# Patient Record
Sex: Female | Born: 1955 | Race: Black or African American | Hispanic: No | Smoking: Former smoker
Health system: Southern US, Community
[De-identification: ages and names within clinical notes are randomized; demographics above are authoritative.]

## PROBLEM LIST (undated history)

## (undated) DIAGNOSIS — I10 Essential (primary) hypertension: Secondary | ICD-10-CM

## (undated) DIAGNOSIS — K219 Gastro-esophageal reflux disease without esophagitis: Secondary | ICD-10-CM

## (undated) DIAGNOSIS — F32A Depression, unspecified: Secondary | ICD-10-CM

## (undated) DIAGNOSIS — E785 Hyperlipidemia, unspecified: Secondary | ICD-10-CM

## (undated) DIAGNOSIS — Z9981 Dependence on supplemental oxygen: Secondary | ICD-10-CM

## (undated) DIAGNOSIS — F419 Anxiety disorder, unspecified: Secondary | ICD-10-CM

## (undated) DIAGNOSIS — J439 Emphysema, unspecified: Secondary | ICD-10-CM

## (undated) DIAGNOSIS — R002 Palpitations: Secondary | ICD-10-CM

## (undated) DIAGNOSIS — R0902 Hypoxemia: Secondary | ICD-10-CM

## (undated) DIAGNOSIS — J449 Chronic obstructive pulmonary disease, unspecified: Secondary | ICD-10-CM

## (undated) DIAGNOSIS — M1990 Unspecified osteoarthritis, unspecified site: Secondary | ICD-10-CM

## (undated) DIAGNOSIS — Z972 Presence of dental prosthetic device (complete) (partial): Secondary | ICD-10-CM

## (undated) DIAGNOSIS — T7840XA Allergy, unspecified, initial encounter: Secondary | ICD-10-CM

## (undated) DIAGNOSIS — K649 Unspecified hemorrhoids: Secondary | ICD-10-CM

## (undated) DIAGNOSIS — J302 Other seasonal allergic rhinitis: Secondary | ICD-10-CM

## (undated) DIAGNOSIS — Z860100 Personal history of colon polyps, unspecified: Secondary | ICD-10-CM

## (undated) DIAGNOSIS — F329 Major depressive disorder, single episode, unspecified: Secondary | ICD-10-CM

## (undated) DIAGNOSIS — R5383 Other fatigue: Secondary | ICD-10-CM

## (undated) DIAGNOSIS — J9610 Chronic respiratory failure, unspecified whether with hypoxia or hypercapnia: Secondary | ICD-10-CM

## (undated) DIAGNOSIS — R918 Other nonspecific abnormal finding of lung field: Secondary | ICD-10-CM

## (undated) DIAGNOSIS — Z973 Presence of spectacles and contact lenses: Secondary | ICD-10-CM

## (undated) DIAGNOSIS — D509 Iron deficiency anemia, unspecified: Secondary | ICD-10-CM

## (undated) HISTORY — DX: Essential (primary) hypertension: I10

## (undated) HISTORY — DX: Emphysema, unspecified: J43.9

## (undated) HISTORY — PX: SALPINGECTOMY: SHX328

## (undated) HISTORY — DX: Hypoxemia: R09.02

## (undated) HISTORY — DX: Palpitations: R00.2

## (undated) HISTORY — DX: Anxiety disorder, unspecified: F41.9

## (undated) HISTORY — DX: Hyperlipidemia, unspecified: E78.5

## (undated) HISTORY — PX: POLYPECTOMY: SHX149

## (undated) HISTORY — PX: OTHER SURGICAL HISTORY: SHX169

## (undated) HISTORY — DX: Other fatigue: R53.83

## (undated) HISTORY — PX: COLONOSCOPY: SHX174

## (undated) HISTORY — PX: CHOLECYSTECTOMY: SHX55

## (undated) HISTORY — DX: Unspecified osteoarthritis, unspecified site: M19.90

## (undated) HISTORY — DX: Allergy, unspecified, initial encounter: T78.40XA

## (undated) HISTORY — DX: Chronic obstructive pulmonary disease, unspecified: J44.9

---

## 1999-11-28 ENCOUNTER — Other Ambulatory Visit: Admission: RE | Admit: 1999-11-28 | Discharge: 1999-11-28 | Payer: Self-pay | Admitting: Internal Medicine

## 2005-11-02 ENCOUNTER — Ambulatory Visit: Payer: Self-pay | Admitting: Gastroenterology

## 2005-11-16 ENCOUNTER — Ambulatory Visit: Payer: Self-pay | Admitting: Gastroenterology

## 2005-11-16 ENCOUNTER — Encounter (INDEPENDENT_AMBULATORY_CARE_PROVIDER_SITE_OTHER): Payer: Self-pay | Admitting: Specialist

## 2005-12-07 ENCOUNTER — Encounter: Admission: RE | Admit: 2005-12-07 | Discharge: 2005-12-07 | Payer: Self-pay | Admitting: Gastroenterology

## 2006-01-05 ENCOUNTER — Ambulatory Visit (HOSPITAL_COMMUNITY): Admission: RE | Admit: 2006-01-05 | Discharge: 2006-01-05 | Payer: Self-pay | Admitting: Gastroenterology

## 2006-01-05 ENCOUNTER — Encounter: Admission: RE | Admit: 2006-01-05 | Discharge: 2006-01-05 | Payer: Self-pay | Admitting: Gastroenterology

## 2008-04-07 ENCOUNTER — Emergency Department (HOSPITAL_COMMUNITY): Admission: EM | Admit: 2008-04-07 | Discharge: 2008-04-07 | Payer: Self-pay | Admitting: Emergency Medicine

## 2008-10-02 ENCOUNTER — Encounter (INDEPENDENT_AMBULATORY_CARE_PROVIDER_SITE_OTHER): Payer: Self-pay | Admitting: *Deleted

## 2008-11-12 ENCOUNTER — Encounter: Payer: Self-pay | Admitting: *Deleted

## 2010-09-12 ENCOUNTER — Other Ambulatory Visit (HOSPITAL_BASED_OUTPATIENT_CLINIC_OR_DEPARTMENT_OTHER): Payer: Self-pay | Admitting: Internal Medicine

## 2010-09-12 DIAGNOSIS — M549 Dorsalgia, unspecified: Secondary | ICD-10-CM

## 2010-09-15 ENCOUNTER — Ambulatory Visit
Admission: RE | Admit: 2010-09-15 | Discharge: 2010-09-15 | Disposition: A | Discharge status: Home / Self Care | Payer: BC Managed Care – PPO | Source: Ambulatory Visit | Attending: Internal Medicine | Admitting: Internal Medicine

## 2010-09-15 DIAGNOSIS — M549 Dorsalgia, unspecified: Secondary | ICD-10-CM

## 2010-10-01 ENCOUNTER — Other Ambulatory Visit: Payer: Self-pay | Admitting: Neurological Surgery

## 2010-10-01 DIAGNOSIS — M25552 Pain in left hip: Secondary | ICD-10-CM

## 2010-10-02 ENCOUNTER — Ambulatory Visit
Admission: RE | Admit: 2010-10-02 | Discharge: 2010-10-02 | Disposition: A | Discharge status: Home / Self Care | Payer: BC Managed Care – PPO | Source: Ambulatory Visit | Attending: Neurological Surgery | Admitting: Neurological Surgery

## 2010-10-02 DIAGNOSIS — M25552 Pain in left hip: Secondary | ICD-10-CM

## 2010-12-01 ENCOUNTER — Ambulatory Visit
Admission: RE | Admit: 2010-12-01 | Discharge: 2010-12-01 | Disposition: A | Discharge status: Home / Self Care | Payer: BC Managed Care – PPO | Source: Ambulatory Visit | Attending: Neurological Surgery | Admitting: Neurological Surgery

## 2010-12-01 ENCOUNTER — Other Ambulatory Visit: Payer: Self-pay | Admitting: Neurological Surgery

## 2010-12-01 DIAGNOSIS — M545 Low back pain, unspecified: Secondary | ICD-10-CM

## 2010-12-02 ENCOUNTER — Other Ambulatory Visit: Payer: Self-pay | Admitting: Neurosurgery

## 2010-12-02 ENCOUNTER — Other Ambulatory Visit: Payer: Self-pay | Admitting: Neurological Surgery

## 2010-12-02 DIAGNOSIS — M545 Low back pain: Secondary | ICD-10-CM

## 2010-12-03 ENCOUNTER — Ambulatory Visit
Admission: RE | Admit: 2010-12-03 | Discharge: 2010-12-03 | Disposition: A | Discharge status: Home / Self Care | Payer: BC Managed Care – PPO | Source: Ambulatory Visit | Attending: Neurological Surgery | Admitting: Neurological Surgery

## 2010-12-03 DIAGNOSIS — M545 Low back pain: Secondary | ICD-10-CM

## 2010-12-25 ENCOUNTER — Encounter: Payer: Self-pay | Admitting: Gastroenterology

## 2011-01-16 ENCOUNTER — Ambulatory Visit (AMBULATORY_SURGERY_CENTER): Payer: BC Managed Care – PPO | Admitting: *Deleted

## 2011-01-16 VITALS — Ht 67.0 in | Wt 155.6 lb

## 2011-01-16 DIAGNOSIS — Z860100 Personal history of colon polyps, unspecified: Secondary | ICD-10-CM

## 2011-01-16 MED ORDER — PEG-KCL-NACL-NASULF-NA ASC-C 100 G PO SOLR: 1.0000 | Freq: Once | ORAL | Status: DC

## 2011-01-16 NOTE — Patient Instructions (Signed)
Please pick up prep at pharmacy Please read over instructions

## 2011-01-19 ENCOUNTER — Encounter: Payer: Self-pay | Admitting: Gastroenterology

## 2011-01-26 ENCOUNTER — Ambulatory Visit (AMBULATORY_SURGERY_CENTER): Payer: BC Managed Care – PPO | Admitting: Gastroenterology

## 2011-01-26 ENCOUNTER — Encounter: Payer: Self-pay | Admitting: Gastroenterology

## 2011-01-26 VITALS — BP 137/80 | HR 98 | Temp 98.5°F | Resp 15 | Ht 67.0 in | Wt 155.0 lb

## 2011-01-26 DIAGNOSIS — K648 Other hemorrhoids: Secondary | ICD-10-CM

## 2011-01-26 DIAGNOSIS — Z1211 Encounter for screening for malignant neoplasm of colon: Secondary | ICD-10-CM

## 2011-01-26 DIAGNOSIS — Z86010 Personal history of colonic polyps: Secondary | ICD-10-CM

## 2011-01-26 MED ORDER — SODIUM CHLORIDE 0.9 % IV SOLN: 500.0000 mL | INTRAVENOUS | Status: DC

## 2011-01-26 NOTE — Patient Instructions (Signed)
Discharge instructions given with verbal understanding. Handout on hemorrhoids and a high fiber diet given. Resume previous medications.

## 2011-01-27 ENCOUNTER — Telehealth: Payer: Self-pay | Admitting: *Deleted

## 2011-01-27 NOTE — Telephone Encounter (Signed)

## 2011-01-28 ENCOUNTER — Other Ambulatory Visit: Payer: BC Managed Care – PPO | Admitting: Gastroenterology

## 2011-05-28 DIAGNOSIS — Z0279 Encounter for issue of other medical certificate: Secondary | ICD-10-CM

## 2012-03-08 ENCOUNTER — Other Ambulatory Visit (HOSPITAL_COMMUNITY): Payer: Self-pay | Admitting: Internal Medicine

## 2012-03-08 DIAGNOSIS — I313 Pericardial effusion (noninflammatory): Secondary | ICD-10-CM

## 2012-03-09 ENCOUNTER — Ambulatory Visit (HOSPITAL_COMMUNITY): Payer: BC Managed Care – PPO | Attending: Cardiovascular Disease | Admitting: Radiology

## 2012-03-09 DIAGNOSIS — J449 Chronic obstructive pulmonary disease, unspecified: Secondary | ICD-10-CM | POA: Insufficient documentation

## 2012-03-09 DIAGNOSIS — R0609 Other forms of dyspnea: Secondary | ICD-10-CM | POA: Insufficient documentation

## 2012-03-09 DIAGNOSIS — I313 Pericardial effusion (noninflammatory): Secondary | ICD-10-CM

## 2012-03-09 DIAGNOSIS — J4489 Other specified chronic obstructive pulmonary disease: Secondary | ICD-10-CM | POA: Insufficient documentation

## 2012-03-09 DIAGNOSIS — R0989 Other specified symptoms and signs involving the circulatory and respiratory systems: Secondary | ICD-10-CM | POA: Insufficient documentation

## 2012-03-09 DIAGNOSIS — I10 Essential (primary) hypertension: Secondary | ICD-10-CM | POA: Insufficient documentation

## 2012-03-09 DIAGNOSIS — Z87891 Personal history of nicotine dependence: Secondary | ICD-10-CM | POA: Insufficient documentation

## 2012-03-09 NOTE — Progress Notes (Signed)
Echocardiogram performed.  

## 2012-03-10 ENCOUNTER — Encounter (HOSPITAL_COMMUNITY): Payer: Self-pay | Admitting: Internal Medicine

## 2012-03-31 ENCOUNTER — Ambulatory Visit (HOSPITAL_COMMUNITY)
Admission: RE | Admit: 2012-03-31 | Discharge: 2012-03-31 | Disposition: A | Discharge status: Home / Self Care | Payer: BC Managed Care – PPO | Source: Ambulatory Visit | Attending: Internal Medicine | Admitting: Internal Medicine

## 2012-03-31 DIAGNOSIS — J4489 Other specified chronic obstructive pulmonary disease: Secondary | ICD-10-CM | POA: Insufficient documentation

## 2012-03-31 DIAGNOSIS — J449 Chronic obstructive pulmonary disease, unspecified: Secondary | ICD-10-CM | POA: Insufficient documentation

## 2012-03-31 LAB — PULMONARY FUNCTION TEST

## 2012-03-31 MED ORDER — ALBUTEROL SULFATE (5 MG/ML) 0.5% IN NEBU
2.5000 mg | INHALATION_SOLUTION | Freq: Once | RESPIRATORY_TRACT | Status: AC
  Administered 2012-03-31: 2.5 mg via RESPIRATORY_TRACT

## 2012-06-10 ENCOUNTER — Other Ambulatory Visit: Payer: BC Managed Care – PPO

## 2012-06-10 ENCOUNTER — Encounter: Payer: Self-pay | Admitting: Internal Medicine

## 2012-06-10 ENCOUNTER — Telehealth: Payer: Self-pay | Admitting: Internal Medicine

## 2012-06-10 ENCOUNTER — Ambulatory Visit (INDEPENDENT_AMBULATORY_CARE_PROVIDER_SITE_OTHER): Payer: BC Managed Care – PPO | Admitting: Internal Medicine

## 2012-06-10 VITALS — BP 142/92 | HR 92 | Temp 98.0°F | Ht 66.0 in | Wt 176.8 lb

## 2012-06-10 DIAGNOSIS — J449 Chronic obstructive pulmonary disease, unspecified: Secondary | ICD-10-CM

## 2012-06-10 DIAGNOSIS — Z23 Encounter for immunization: Secondary | ICD-10-CM

## 2012-06-10 MED ORDER — PREDNISONE 10 MG PO TABS: ORAL_TABLET | ORAL | Status: DC

## 2012-06-10 MED ORDER — BUDESONIDE-FORMOTEROL FUMARATE 80-4.5 MCG/ACT IN AERO: 2.0000 | INHALATION_SPRAY | Freq: Two times a day (BID) | RESPIRATORY_TRACT | Status: DC

## 2012-06-10 MED ORDER — TIOTROPIUM BROMIDE MONOHYDRATE 18 MCG IN CAPS: 18.0000 ug | ORAL_CAPSULE | Freq: Every day | RESPIRATORY_TRACT | Status: DC

## 2012-06-10 NOTE — Progress Notes (Signed)
Subjective:    Patient ID: Katie Greene, female    DOB: 1956-01-16, 56 y.o.   MRN: [id number]  HPI  PCP is Garlan Fillers, MD  Body mass index is 28.54 kg/(m^2).  reports that she quit smoking about 3 months ago. Her smoking use included Cigarettes. She has a 12.5 pack-year smoking history. She has never used smokeless tobacco.   IOV 06/10/2012 Referred for copd. She quit smoking July 2013.   - She is an AA female  On nocturnal o2 x  Years however but was told it was due to "going into early copd". Then in July 2013: had acute bronchitis illness. Aparently was told at that time in [facility] urgent care she had copd. Firs time she knew of copd was in [place] 2013. Placed on 24h oxyge in July 2012.  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 Rx tudorza, and qvar  - Currently high symptom burden with CAT score 28 (see below) and therefore referred here. 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 She prefers to work but having difficulty with dyspnea. So she tried to but could not. Now accepting of filing for disability and is inclined to do that. Also, facing high cost of medication . Never been to pulmonary rehab. Not tested for alpha 1. Has not had flu shot 2013-2014 season. Not had pneumovax  - She is also worried she might have lung cancer; several family members have had various cancers   CAT COPD Symptom & Quality of Life Score (GSK trademark) 0 is no burden. 5 is highest burden 06/10/2012   Never Cough -> Cough all the time 3  No phlegm in chest -> Chest is full of phlegm 2  No chest tightness -> Chest feels very tight 2  No dyspnea for 1 flight stairs/hill -> Very dyspneic for 1 flight of stairs 5  No limitations for ADL at home -> Very limited with ADL at home 5  Confident leaving home -> Not at all confident leaving home 3  Sleep soundly -> Do not sleep soundly because of lung condition 4  Lots of  Energy -> No energy at all 4  TOTAL Score (max 40)  28        PFT FVC fev1 ratio BD fev1 TLC DLCO comment  03/31/12 1.65L/55% 0.78L/32% 47/58% 11% 4.8L/88% 6.5/23%% Gold stage 3 copd with post bd fev1 0.86L/36%     Past Medical History  Diagnosis Date  . Anxiety   . Arthritis   . Hyperlipidemia   . Hypertension   . Palpitations   . Fatigue   . Chest pain     Improved     Family History  Problem Relation Age of Onset  . Colon cancer Neg Hx   . Heart attack Mother   . Cancer Father   . Hypertension Father   . Cancer Brother     Throat Cancer     History   Social History  . Marital Status: Married    Spouse Name: N/A    Number of Children: N/A  . Years of Education: N/A   Occupational History  . Not on file.   Social History Main Topics  . Smoking status: Former Smoker -- 0.5 packs/day for 25 years    Types: Cigarettes    Quit date: 03/02/2012  . Smokeless tobacco: Never Used     Comment: pt states she smoked some  and stopped again 8-13.  Marland Kitchen Alcohol Use: No  . Drug Use: No  . Sexually Active: Not on file   Other Topics Concern  . Not on file   Social History Narrative  . No narrative on file     No Known Allergies   Outpatient Prescriptions Prior to Visit  Medication Sig Dispense Refill  . amLODipine (NORVASC) 5 MG tablet Take 5 mg by mouth daily.        Marland Kitchen aspirin 81 MG tablet Take 81 mg by mouth daily.        . meloxicam (MOBIC) 7.5 MG tablet 1 tablet Daily.      . pravastatin (PRAVACHOL) 20 MG tablet Take 40 mg by mouth daily.       . temazepam (RESTORIL) 15 MG capsule 1 tablet Daily.      . TOPROL XL 50 MG 24 hr tablet 1 tablet Daily.      . [DISCONTINUED] hydrocodone-acetaminophen (LORCET-HD) 5-500 MG per capsule Take 1 capsule by mouth every 6 (six) hours as needed.        . [DISCONTINUED] quinapril-hydrochlorothiazide (ACCURETIC) 20-25 MG per tablet Take 1 tablet by mouth daily.         Last reviewed on 06/10/2012 11:21 AM by Darrell Jewel, CMA      Review of Systems  Constitutional: Negative for fever and unexpected weight change.  HENT: Positive for sneezing. Negative for ear pain, nosebleeds, congestion, sore throat, rhinorrhea, trouble swallowing, dental problem, postnasal drip and sinus pressure.   Eyes: Negative for redness and itching.  Respiratory: Positive for cough and shortness of breath. Negative for chest tightness and wheezing.   Cardiovascular: Positive for palpitations. Negative for leg swelling.  Gastrointestinal: Negative for nausea and vomiting.  Genitourinary: Negative for dysuria.  Musculoskeletal: Negative for joint swelling.  Skin: Negative for rash.  Neurological: Negative for headaches.  Hematological: Does not bruise/bleed easily.  Psychiatric/Behavioral: Negative for dysphoric mood. The patient is not nervous/anxious.        Objective:   Physical Exam  Vitals reviewed. Constitutional: She is oriented to person, place, and time. She appears well-developed and well-nourished. No distress.       Body mass index is 28.54 kg/(m^2).   HENT:  Head: Normocephalic and atraumatic.  Right Ear: External ear normal.  Left Ear: External ear normal.  Mouth/Throat: Oropharynx is clear and moist. No oropharyngeal exudate.  Eyes: Conjunctivae normal and EOM are normal. Pupils are equal, round, and reactive to light. Right eye exhibits no discharge. Left eye exhibits no discharge. No scleral icterus.  Neck: Normal range of motion. Neck supple. No JVD present. No tracheal deviation present. No thyromegaly present.  Cardiovascular: Normal rate, regular rhythm, normal heart sounds and intact distal pulses.  Exam reveals no gallop and no friction rub.   No murmur heard. Pulmonary/Chest: Effort normal. No respiratory distress. She has no wheezes. She has no rales. She exhibits no tenderness.       Mild purse lip breathing when emotiona Expiraton prolonged  Abdominal: Soft. Bowel sounds are normal.  She exhibits no distension and no mass. There is no tenderness. There is no rebound and no guarding.  Musculoskeletal: Normal range of motion. She exhibits no edema and no tenderness.  Lymphadenopathy:    She has no cervical adenopathy.  Neurological: She is alert and oriented to person, place, and time. She has normal reflexes. No cranial nerve deficit. She exhibits normal muscle tone. Coordination normal.  Skin: Skin is warm and  dry. No rash noted. She is not diaphoretic. No erythema. No pallor.  Psychiatric: She has a normal mood and affect. Her behavior is normal. Judgment and thought content normal.       Flat affect Periodic reactive grief reaction during hx          Assessment & Plan:

## 2012-06-10 NOTE — Patient Instructions (Addendum)
#  COPD  - glad you quit smoking  - continne oxygen  - have flu shot and pneumovax today 06/10/2012 - have blood work for genetic cause of copd - stop tudorza and qvar - Take prednisone 40 mg daily x 2 days, then 20mg  daily x 2 days, then 10mg  daily x 2 days, then 5mg  daily x 2 days and stop - Please start spiriva 1 puff daily - take sample, script and show technique - Please start symbicort 80/4.5 2 puff twice daily - take sample, script and show technique - referring you to pulmonary rehab at [place]   - give health port short term disability paper work  #Lung Cancer screening  - will discuss at next visit  #Followup  - 3 weeks to report progress and discuss cancer screening

## 2012-06-10 NOTE — Telephone Encounter (Signed)
NOTE: FAX THIS TO [facility] ; ATTN: CENTRALIZED LEAVE DEPT  RE: CASE # [id number]. FAX # [phone].Hazel Sams

## 2012-06-10 NOTE — Telephone Encounter (Signed)
[email or web address]  

## 2012-06-10 NOTE — Telephone Encounter (Signed)
Spoke with pt.  States she needs a letter along with information from today's visit sent to below.  Letter needs to include a "tenative" return to work date.  MR, pls advise.  Thank you.

## 2012-06-11 NOTE — Telephone Encounter (Signed)
she had tried to go back to work and was very difficult due to her copd. Based on this, early part of interview she said she hoped to get better and then go back to work. Later, she said her pcp Garlan Fillers, MD had recommended disability and she wished to pursue that (short term). So I think Victorino Dike told her to go to health port with disability paper work.  So, I am confused now .  Does she want simply a letter with return date to work ? IF so, can it be several months from now. OR she still wants to go ahed with short term disability paper work?  Thanks MR

## 2012-06-12 ENCOUNTER — Encounter: Payer: Self-pay | Admitting: Internal Medicine

## 2012-06-12 DIAGNOSIS — J449 Chronic obstructive pulmonary disease, unspecified: Secondary | ICD-10-CM | POA: Insufficient documentation

## 2012-06-12 NOTE — Assessment & Plan Note (Signed)
#  COPD  - glad you quit smoking  - continne oxygen  - have flu shot and pneumovax today 06/10/2012 - have blood work for genetic cause of copd - stop tudorza and qvar - Take prednisone 40 mg daily x 2 days, then 20mg daily x 2 days, then 10mg daily x 2 days, then 5mg daily x 2 days and stop - Please start spiriva 1 puff daily - take sample, script and show technique - Please start symbicort 80/4.5 2 puff twice daily - take sample, script and show technique - referring you to pulmonary rehab at [place]   - give health port short term disability paper work  #Lung Cancer screening  - will discuss at next visit  #Followup  - 3 weeks to report progress and discuss cancer screening  

## 2012-06-13 ENCOUNTER — Encounter: Payer: Self-pay | Admitting: Internal Medicine

## 2012-06-13 NOTE — Telephone Encounter (Signed)
Pt states she needs a letter from MR extending her LOA date from work past today's date, 06/13/12. The letter then needs to be faxed to:  1) [name]. Attn Katina Degree, fax # [phone]  2) Heide Scales. Fax # [phone]

## 2012-06-14 ENCOUNTER — Encounter: Payer: Self-pay | Admitting: Internal Medicine

## 2012-06-14 NOTE — Telephone Encounter (Signed)
Done and sent to triage

## 2012-06-14 NOTE — Telephone Encounter (Signed)
Need to know if note was sent to to centralized leave department extending continuously leave which would end today(06/13/12).Case# [id number] fax# [phone].This information will also need to be sent to [employer] short term disability office([name]-fax# [phone] incident#[id number])and [name] hr department([name] fax# [phone]).This information is needed asap so that i can continue to get short term/long term disablity when short term has ended.Thanks for all your help. Katie Greene   This was forwarded from an email we received from the pt yesterday. We sent an email back to the pt to let her know MR aware of what she is needing.

## 2012-06-15 NOTE — Telephone Encounter (Signed)
Spoke with pt and informed that letter was completed and faxed to all three numbers that pt requested.  (Letter was saved in triage in case something didn't go through.)

## 2012-06-22 NOTE — Progress Notes (Signed)
Quick Note:  Spoke with patient, made her aware of results as listed below by MR. Verbalized understanding of this and nothing further needed at this time. ______

## 2012-06-29 ENCOUNTER — Ambulatory Visit (INDEPENDENT_AMBULATORY_CARE_PROVIDER_SITE_OTHER): Payer: BC Managed Care – PPO | Admitting: Internal Medicine

## 2012-06-29 ENCOUNTER — Encounter: Payer: Self-pay | Admitting: Internal Medicine

## 2012-06-29 VITALS — BP 150/96 | HR 121 | Temp 97.7°F | Ht 64.0 in | Wt 176.0 lb

## 2012-06-29 DIAGNOSIS — J449 Chronic obstructive pulmonary disease, unspecified: Secondary | ICD-10-CM

## 2012-06-29 DIAGNOSIS — Z129 Encounter for screening for malignant neoplasm, site unspecified: Secondary | ICD-10-CM

## 2012-06-29 DIAGNOSIS — Z87891 Personal history of nicotine dependence: Secondary | ICD-10-CM

## 2012-06-29 NOTE — Progress Notes (Signed)
Subjective:    Patient ID: Katie Greene, female    DOB: [birth year], 56 y.o.   MRN: [id number]  HPI PCP is Garlan Fillers, MD  #COPD - Gold stage 3. On home o2 since July 2012.  PFT FVC fev1 ratio BD fev1 TLC DLCO comment  03/31/12 1.65L/55% 0.78L/32% 47/58% 11% 4.8L/88% 6.5/23%% Gold stage 3 copd with post bd fev1 0.86L/36%  - RX spiriva/symbicort - Applied disability Nov 2013  #SMoking  - quit 2013  #Overweight  - Body mass index is 28.54 kg/(m^2).   #Lung cancer rrisk  - age, smoking, strong family hx of several cancers     OV 06/29/2012 Fu COPD and lung cancer screen discussion  - At last visit earlier this month started on spiriva, symbicort and given pred burst. Asked to continue o2. With these she is "some" improved only. CAT score is 27 (prior was 28). No new issues. No chest pain. She feels stable. REhab is yet to call her  - Lung cancer screen: she is inerested in this based on data. She is not sure she can pay $300 out of pocket. Wants me to try through insurance first    CAT COPD Symptom & Quality of Life Score (GSK trademark) 0 is no burden. 5 is highest burden 06/10/2012  06/29/2012   Never Cough -> Cough all the time 3 2  No phlegm in chest -> Chest is full of phlegm 2 3  No chest tightness -> Chest feels very tight 2 3  No dyspnea for 1 flight stairs/hill -> Very dyspneic for 1 flight of stairs 5 5  No limitations for ADL at home -> Very limited with ADL at home 5 3  Confident leaving home -> Not at all confident leaving home 3 4  Sleep soundly -> Do not sleep soundly because of lung condition 4 4  Lots of Energy -> No energy at all 4 3  TOTAL Score (max 40)  28 27         Review of Systems  Constitutional: Negative for fever and unexpected weight change.  HENT: Negative for ear pain, nosebleeds, congestion, sore throat, rhinorrhea, sneezing, trouble swallowing, dental problem, postnasal drip and sinus pressure.   Eyes: Negative for redness  and itching.  Respiratory: Positive for shortness of breath. Negative for cough, chest tightness and wheezing.   Cardiovascular: Negative for palpitations and leg swelling.  Gastrointestinal: Negative for nausea and vomiting.  Genitourinary: Negative for dysuria.  Musculoskeletal: Negative for joint swelling.  Skin: Negative for rash.  Neurological: Positive for headaches.  Hematological: Does not bruise/bleed easily.  Psychiatric/Behavioral: Negative for dysphoric mood. The patient is not nervous/anxious.        Objective:   Physical Exam Vitals reviewed. Filed Vitals:   06/29/12 1528  BP: 150/96  Pulse: 121  Temp: 97.7 F (36.5 C)  TempSrc: Oral  Height: 5\' 4"  (1.626 m)  Weight: 176 lb (79.833 kg)  SpO2: 93%    Constitutional: She is oriented to person, place, and time. She appears well-developed and well-nourished. No distress.       Body mass index is 28.54 kg/(m^2).   HENT:  Head: Normocephalic and atraumatic.  Right Ear: External ear normal.  Left Ear: External ear normal.  Mouth/Throat: Oropharynx is clear and moist. No oropharyngeal exudate.  Eyes: Conjunctivae normal and EOM are normal. Pupils are equal, round, and reactive to light. Right eye exhibits no discharge. Left eye exhibits no discharge. No scleral icterus.  Neck: Normal range of  motion. Neck supple. No JVD present. No tracheal deviation present. No thyromegaly present.  Cardiovascular: Normal rate, regular rhythm, normal heart sounds and intact distal pulses.  Exam reveals no gallop and no friction rub.   No murmur heard. Pulmonary/Chest: Effort normal. No respiratory distress. She has no wheezes. She has no rales. She exhibits no tenderness.       Mild purse lip breathing when emotiona Expiraton prolonged  Abdominal: Soft. Bowel sounds are normal. She exhibits no distension and no mass. There is no tenderness. There is no rebound and no guarding.  Musculoskeletal: Normal range of motion. She exhibits  no edema and no tenderness.  Lymphadenopathy:    She has no cervical adenopathy.  Neurological: She is alert and oriented to person, place, and time. She has normal reflexes. No cranial nerve deficit. She exhibits normal muscle tone. Coordination normal.  Skin: Skin is warm and dry. No rash noted. She is not diaphoretic. No erythema. No pallor.  Psychiatric: She has a normal mood and affect. Her behavior is normal. Judgment and thought content normal.       Flat affect Periodic reactive grief reaction during hx           Assessment & Plan:

## 2012-06-29 NOTE — Patient Instructions (Addendum)
#  COPD -  glad you some better  - continne oxygen  -continue  spiriva 1 puff daily - take samples - continue symbicort 80/4.5 2 puff twice daily - take samples  - I have emailed rehab people, you should  Hear from them < 1 month; rehab is most important part of treatment   -at some point in future we can discuss lung transplant  #Lung Cancer screening  - discussed this we will see if insurance will pay for it. If not will hold off due to $300 cost  #Followup  - 3 months to report progress with CAT score - call any time there is a problem [phone] (esp if there is a copd flare

## 2012-06-29 NOTE — Assessment & Plan Note (Signed)
Discussed screening Ct chest for early detection of lung cancer Explained that in age 58-75 and smoking history, annual low dose CT chest can pick up lung cancer early and has potential to save lives and cure lung cancer This is similar to screening mammogram, colonoscopies and pap smears Explained Ct scan is low dose radiation Explained early lung cancer is curable but asymptomatic and best way to detect is CT scan Explained CT superior to CXR Explained that false positives are present and can incur cost and workup like biopsies, additional scan but benefit outweighs risk Explained currently out of pocket $300   Baesd on this she wants me to try to get it through insurance

## 2012-06-29 NOTE — Assessment & Plan Note (Signed)
#  COPD -  glad you some better  - continne oxygen  -continue  spiriva 1 puff daily - take samples - continue symbicort 80/4.5 2 puff twice daily - take samples  - I have emailed rehab people, you should  Hear from them < 1 month; rehab is most important part of treatment   -at some point in future we can discuss lung transplant

## 2012-07-05 ENCOUNTER — Encounter: Payer: Self-pay | Admitting: Internal Medicine

## 2012-07-06 ENCOUNTER — Other Ambulatory Visit: Payer: Self-pay | Admitting: Internal Medicine

## 2012-07-06 DIAGNOSIS — J449 Chronic obstructive pulmonary disease, unspecified: Secondary | ICD-10-CM

## 2012-07-07 ENCOUNTER — Other Ambulatory Visit: Payer: BC Managed Care – PPO

## 2012-07-07 NOTE — Telephone Encounter (Signed)
Dr. Marchelle Gearing,  The pt is requesting a letter extending her leave from work through her appt with you in Feb. Please advise if ok to give extension letter. Thanks. Carron Curie, CMA

## 2012-07-12 ENCOUNTER — Encounter: Payer: Self-pay | Admitting: Internal Medicine

## 2012-07-15 ENCOUNTER — Telehealth: Payer: Self-pay | Admitting: Internal Medicine

## 2012-07-15 NOTE — Telephone Encounter (Signed)
Returning a call to the nurse about her ct scan she spoke to the ins ct is covered up to 90% and spoke to them about rehab [phone]

## 2012-07-15 NOTE — Telephone Encounter (Signed)
I spoke with pt and she stated she spoke with her insurance and she stated CT scan will cover 90% and her pulm rehab will need prior authorization. Please advise PCC;s

## 2012-07-18 ENCOUNTER — Encounter: Payer: Self-pay | Admitting: Internal Medicine

## 2012-07-18 NOTE — Telephone Encounter (Signed)
Pt wants to extend her leave up to 6 months since she has been scheduled for rehab from 08-09-12 to 12-06-12. Please advise if ok to write the extension. Please send response to [facility]. Thanks.Carron Curie, CMA

## 2012-07-18 NOTE — Telephone Encounter (Signed)
Please include the following procedure codes when requesting pulmonary rehab: G0237, G0238, G0239 and [id number]. Katie Greene

## 2012-07-18 NOTE — Telephone Encounter (Signed)
Called and spoke with Katie Greene at [facility] [place] and she stated that pre-determination is required for pulmonary rehab. I can fax notes, but a letter of medial necessity would have to come from the physician.  Once I have the letter, I can fax clinical's with the letter to the below fax number. Rhonda J Cobb

## 2012-07-18 NOTE — Telephone Encounter (Signed)
Will forward to MR to see what he wants put in the letter Please advise, thanks!

## 2012-07-25 NOTE — Telephone Encounter (Signed)
Dr. Marchelle Gearing please advise on letter. There is also a patient email in your box on this patient as well. Please advise. Carron Curie, CMA

## 2012-07-25 NOTE — Telephone Encounter (Signed)
Do a letter statting that on March 31, 2012 PFT shows Gold stage 3 (severe) cCOPD - with fev1 0.78L/32%, FVC 1.65L/55%, a Ratio 47 and DLCO 6.5/23%.  done while patient on stable state while on max medical therapy of spiriva and symbicort and oxygen Therapy. Her symptom burden is high as determined by CAT score of 27 in Nov 2013 with severe dyspnea at baseline that is disabling and impeding ADL. GOLD guidelines mandate that patient such as Ms. Stucker with above features should undergo pulmonary rehabilitation and therefore she is being recommended the same.  The pulmonary rehabilitation program is physician directed care plan and is multi-disciplinary and we anticipaed measurable improvement in 36 sessions.

## 2012-07-26 ENCOUNTER — Encounter: Payer: Self-pay | Admitting: *Deleted

## 2012-07-26 NOTE — Telephone Encounter (Signed)
Yes do leave extensuion . I ddid not see that email

## 2012-07-26 NOTE — Telephone Encounter (Signed)
Done. Katie Greene, CMA  

## 2012-07-26 NOTE — Telephone Encounter (Signed)
Letter done and faxed to numbers requested per patient email. Carron Curie, CMA

## 2012-07-26 NOTE — Telephone Encounter (Signed)
Letter for insurance done and given to [place]. Dr. Marchelle Gearing there is also a patient email that I sent to you asking for a leave extension from work thorough May while the pt is in pulm rehab. Is this ok to do as well? Carron Curie, CMA

## 2012-08-05 ENCOUNTER — Telehealth: Payer: Self-pay | Admitting: Internal Medicine

## 2012-08-05 MED ORDER — BUDESONIDE-FORMOTEROL FUMARATE 80-4.5 MCG/ACT IN AERO: 2.0000 | INHALATION_SPRAY | Freq: Two times a day (BID) | RESPIRATORY_TRACT | Status: DC

## 2012-08-05 MED ORDER — TIOTROPIUM BROMIDE MONOHYDRATE 18 MCG IN CAPS: 18.0000 ug | ORAL_CAPSULE | Freq: Every day | RESPIRATORY_TRACT | Status: DC

## 2012-08-05 NOTE — Telephone Encounter (Signed)
Pt is aware that we have samples ready for her to pick up. 

## 2012-08-08 ENCOUNTER — Ambulatory Visit (HOSPITAL_COMMUNITY): Payer: BC Managed Care – PPO

## 2012-08-12 ENCOUNTER — Encounter: Payer: Self-pay | Admitting: Internal Medicine

## 2012-08-17 ENCOUNTER — Inpatient Hospital Stay (HOSPITAL_COMMUNITY): Admission: RE | Admit: 2012-08-17 | Payer: BC Managed Care – PPO | Source: Ambulatory Visit

## 2012-08-24 ENCOUNTER — Ambulatory Visit (HOSPITAL_COMMUNITY): Payer: BC Managed Care – PPO

## 2012-09-07 ENCOUNTER — Encounter: Payer: Self-pay | Admitting: Internal Medicine

## 2012-09-07 ENCOUNTER — Ambulatory Visit (HOSPITAL_COMMUNITY): Payer: BC Managed Care – PPO

## 2012-09-13 ENCOUNTER — Encounter (HOSPITAL_COMMUNITY)
Admission: RE | Admit: 2012-09-13 | Discharge: 2012-09-13 | Disposition: A | Discharge status: Home / Self Care | Payer: BC Managed Care – PPO | Source: Ambulatory Visit | Attending: Internal Medicine | Admitting: Internal Medicine

## 2012-09-13 ENCOUNTER — Ambulatory Visit (HOSPITAL_COMMUNITY): Payer: BC Managed Care – PPO

## 2012-09-13 ENCOUNTER — Encounter (HOSPITAL_COMMUNITY): Payer: Self-pay

## 2012-09-13 DIAGNOSIS — J4489 Other specified chronic obstructive pulmonary disease: Secondary | ICD-10-CM | POA: Insufficient documentation

## 2012-09-13 DIAGNOSIS — Z5189 Encounter for other specified aftercare: Secondary | ICD-10-CM | POA: Insufficient documentation

## 2012-09-13 DIAGNOSIS — J449 Chronic obstructive pulmonary disease, unspecified: Secondary | ICD-10-CM | POA: Insufficient documentation

## 2012-09-13 NOTE — Progress Notes (Signed)
Pt participated in pulmonary rehab orientation today.  Pt oriented to program guidelines and participant expectations.  Pt instructed in purse-lip breathing and demonstrated understanding.   Pt alert and oriented, well kept, appears same as her stated age.   normal skin color.   Pt lungs clear, slightly diminished on right.  Heart regular rate and rhythm.  Bowel sounds active x4.  Equal grip strength and lower extremity strength.  No pedal edema present.  VSS.  Appropriate achievable goals self identified by patient.  Pt verbalized understanding.  Pt scheduled to begin program September 20, 2012.  6 minute walk test will be given after first day of exercise.

## 2012-09-16 ENCOUNTER — Ambulatory Visit: Payer: BC Managed Care – PPO | Admitting: Internal Medicine

## 2012-09-19 ENCOUNTER — Ambulatory Visit (INDEPENDENT_AMBULATORY_CARE_PROVIDER_SITE_OTHER): Payer: BC Managed Care – PPO | Admitting: Internal Medicine

## 2012-09-19 ENCOUNTER — Encounter: Payer: Self-pay | Admitting: Internal Medicine

## 2012-09-19 VITALS — BP 150/92 | HR 100 | Temp 98.3°F | Ht 66.0 in | Wt 182.0 lb

## 2012-09-19 DIAGNOSIS — J449 Chronic obstructive pulmonary disease, unspecified: Secondary | ICD-10-CM

## 2012-09-19 DIAGNOSIS — Z87891 Personal history of nicotine dependence: Secondary | ICD-10-CM

## 2012-09-19 DIAGNOSIS — J441 Chronic obstructive pulmonary disease with (acute) exacerbation: Secondary | ICD-10-CM

## 2012-09-19 DIAGNOSIS — R042 Hemoptysis: Secondary | ICD-10-CM

## 2012-09-19 MED ORDER — TIOTROPIUM BROMIDE MONOHYDRATE 18 MCG IN CAPS: 18.0000 ug | ORAL_CAPSULE | Freq: Every day | RESPIRATORY_TRACT | Status: DC

## 2012-09-19 MED ORDER — DOXYCYCLINE HYCLATE 100 MG PO TABS: ORAL_TABLET | ORAL | Status: DC

## 2012-09-19 MED ORDER — PREDNISONE 10 MG PO TABS: ORAL_TABLET | ORAL | Status: DC

## 2012-09-19 NOTE — Patient Instructions (Addendum)
#  Acute exacerbation COPD Take doxycycline 100mg  po twice daily x 5 days; take after meals and avoid sunlight Take prednisone 40 mg daily x 2 days, then 20mg  daily x 2 days, then 10mg  daily x 2 days, then 5mg  daily x 2 days and stop  #Hemoptysis or coughing up of blood  - Please have CT scan of the chest especially in view of  smoking history   #COPD - Continue regular treatment as before including oxygen and inhalers - I recommend you take N acetylcysteine 600 mg twice a day. This could potentially help cut down your risk for COPD exacerbation .The drug functions by being an anti-oxidant.  The drug is cheap and is over-the-counter at [facility] vitamin store or [email or web address].   #Followup 3 months or sooner with COPD cat score

## 2012-09-19 NOTE — Progress Notes (Signed)
Subjective:    Patient ID: Katie Greene, female    DOB: 03/17/1956, 57 y.o.   MRN: [id number]  HPI PCP is Garlan Fillers, MD  #COPD - Gold stage 3. On home o2 since July 2012.  PFT FVC fev1 ratio BD fev1 TLC DLCO comment  03/31/12 1.65L/55% 0.78L/32% 47/58% 11% 4.8L/88% 6.5/23%% Gold stage 3 copd with post bd fev1 0.86L/36%  - RX spiriva/symbicort - Applied disability Nov 2013  #AECOPD  - February 2014 outpatient treatment  #SMoking  - quit 2013  #Overweight  - Body mass index is 28.54 kg/(m^2).  - Body mass index is 29.39 kg/(m^2). on 09/19/2012    #Lung cancer rrisk  - age, smoking, strong family hx of several cancers     OV 06/29/2012 Fu COPD and lung cancer screen discussion  - At last visit earlier this month started on spiriva, symbicort and given pred burst. Asked to continue o2. With these she is "some" improved only. CAT score is 27 (prior was 28). No new issues. No chest pain. She feels stable. REhab is yet to call her  - Lung cancer screen: she is inerested in this based on data. She is not sure she can pay $300 out of pocket. Wants me to try through insurance first   REC   #COPD  - glad you some better  - continne oxygen  -continue spiriva 1 puff daily - take samples  - continue symbicort 80/4.5 2 puff twice daily - take samples  - I have emailed rehab people, you should Hear from them < 1 month; rehab is most important part of treatment  -at some point in future we can discuss lung transplant  #Lung Cancer screening  - discussed this we will see if insurance will pay for it. If not will hold off due to $300 cost  #Followup  - 3 months to report progress with CAT score  - call any time there is a problem [phone] (esp if there is a copd flare    OV 09/19/2012 Katie Greene returns for routine followup of her COPD. In terms of a COPD cat score she is no different and in fact score suggests that she is better with a score of 24. However she states  for the last week she has increasing tiredness and mild worsening of shortness of breath associated with worsening cough but no change in mucus production volume or color. However that is new associated mild amounts of scanty hemoptysis. No associated fever or chest pain. Symptoms are associated with worsening fatigue.    CAT COPD Symptom & Quality of Life Score (GSK trademark) 0 is no burden. 5 is highest burden 06/10/2012  06/29/2012  09/19/2012   Never Cough -> Cough all the time 3 2 1   No phlegm in chest -> Chest is full of phlegm 2 3 1   No chest tightness -> Chest feels very tight 2 3 1   No dyspnea for 1 flight stairs/hill -> Very dyspneic for 1 flight of stairs 5 5 5   No limitations for ADL at home -> Very limited with ADL at home 5 3 3   Confident leaving home -> Not at all confident leaving home 3 4 2   Sleep soundly -> Do not sleep soundly because of lung condition 4 4 3   Lots of Energy -> No energy at all 4 3 4   TOTAL Score (max 40)  28 27 24      Past, Family, Social reviewed: no change since last visit  Review of Systems  Constitutional: Negative for fever and unexpected weight change.  HENT: Negative for ear pain, nosebleeds, congestion, sore throat, rhinorrhea, sneezing, trouble swallowing, dental problem, postnasal drip and sinus pressure.   Eyes: Negative for redness and itching.  Respiratory: Negative for cough, chest tightness, shortness of breath and wheezing.   Cardiovascular: Negative for palpitations and leg swelling.  Gastrointestinal: Negative for nausea and vomiting.  Genitourinary: Negative for dysuria.  Musculoskeletal: Negative for joint swelling.  Skin: Negative for rash.  Neurological: Negative for headaches.  Hematological: Does not bruise/bleed easily.  Psychiatric/Behavioral: Negative for dysphoric mood. The patient is not nervous/anxious.        Objective:   Physical Exam Vitals reviewed.  Constitutional: She is oriented to person, place,  and time. She appears well-developed and well-nourished. No distress.       Body mass index is 28.54 kg/(m^2).   HENT:  Head: Normocephalic and atraumatic.  Right Ear: External ear normal.  Left Ear: External ear normal.  Mouth/Throat: Oropharynx is clear and moist. No oropharyngeal exudate.  Eyes: Conjunctivae normal and EOM are normal. Pupils are equal, round, and reactive to light. Right eye exhibits no discharge. Left eye exhibits no discharge. No scleral icterus.  Neck: Normal range of motion. Neck supple. No JVD present. No tracheal deviation present. No thyromegaly present.  Cardiovascular: Normal rate, regular rhythm, normal heart sounds and intact distal pulses.  Exam reveals no gallop and no friction rub.   No murmur heard. Pulmonary/Chest: Effort normal. No respiratory distress. She has no wheezes. She has no rales. She exhibits no tenderness.       Mild purse lip breathing when emotiona Expiraton prolonged  Abdominal: Soft. Bowel sounds are normal. She exhibits no distension and no mass. There is no tenderness. There is no rebound and no guarding.  Musculoskeletal: Normal range of motion. She exhibits no edema and no tenderness.  Lymphadenopathy:    She has no cervical adenopathy.  Neurological: She is alert and oriented to person, place, and time. She has normal reflexes. No cranial nerve deficit. She exhibits normal muscle tone. Coordination normal.  Skin: Skin is warm and dry. No rash noted. She is not diaphoretic. No erythema. No pallor.  Psychiatric: She has a normal mood and affect. Her behavior is normal. Judgment and thought content normal.       Flat affect though this seems better compared to prior visits       Assessment & Plan:

## 2012-09-20 ENCOUNTER — Encounter (HOSPITAL_COMMUNITY)
Admission: RE | Admit: 2012-09-20 | Discharge: 2012-09-20 | Disposition: A | Discharge status: Home / Self Care | Payer: BC Managed Care – PPO | Source: Ambulatory Visit | Attending: Internal Medicine | Admitting: Internal Medicine

## 2012-09-20 NOTE — Progress Notes (Signed)
Patients first day of exercise with pulmonary rehab dept. Patient did really well. We did her pre 6 minute walk test and her oxygen sats stayed above 95% on 2L nasal cannula. Bp was stable and she was able to ambulate 1, 087ft. No complaints with walk test or with exertion. Demonstrated the use of equipment and with PLB. Patient acknowledges. Will continue to support and encourage patient.

## 2012-09-22 ENCOUNTER — Encounter (HOSPITAL_COMMUNITY)
Admission: RE | Admit: 2012-09-22 | Discharge: 2012-09-22 | Disposition: A | Discharge status: Home / Self Care | Payer: BC Managed Care – PPO | Source: Ambulatory Visit | Attending: Internal Medicine | Admitting: Internal Medicine

## 2012-09-22 ENCOUNTER — Encounter: Payer: Self-pay | Admitting: Internal Medicine

## 2012-09-22 ENCOUNTER — Ambulatory Visit (INDEPENDENT_AMBULATORY_CARE_PROVIDER_SITE_OTHER)
Admission: RE | Admit: 2012-09-22 | Discharge: 2012-09-22 | Disposition: A | Discharge status: Home / Self Care | Payer: BC Managed Care – PPO | Source: Ambulatory Visit | Attending: Internal Medicine | Admitting: Internal Medicine

## 2012-09-22 DIAGNOSIS — R042 Hemoptysis: Secondary | ICD-10-CM

## 2012-09-22 DIAGNOSIS — Z87891 Personal history of nicotine dependence: Secondary | ICD-10-CM | POA: Insufficient documentation

## 2012-09-22 DIAGNOSIS — J441 Chronic obstructive pulmonary disease with (acute) exacerbation: Secondary | ICD-10-CM | POA: Insufficient documentation

## 2012-09-22 NOTE — Assessment & Plan Note (Signed)
Even though the hemoptysis is mild because of history of smoking get CT scan chest

## 2012-09-22 NOTE — Assessment & Plan Note (Signed)
#  Acute exacerbation COPD Take doxycycline 100mg  po twice daily x 5 days; take after meals and avoid sunlight Take prednisone 40 mg daily x 2 days, then 20mg  daily x 2 days, then 10mg  daily x 2 days, then 5mg  daily x 2 days and stop   #Followup 3 months or sooner with COPD cat score

## 2012-09-22 NOTE — Assessment & Plan Note (Signed)
  #  COPD - Continue regular treatment as before including oxygen and inhalers - I recommend you take N acetylcysteine 600 mg twice a day. This could potentially help cut down your risk for COPD exacerbation .The drug functions by being an anti-oxidant.  The drug is cheap and is over-the-counter at [facility] vitamin store or [employer].

## 2012-09-26 ENCOUNTER — Encounter: Payer: Self-pay | Admitting: Internal Medicine

## 2012-09-27 ENCOUNTER — Encounter (HOSPITAL_COMMUNITY)
Admission: RE | Admit: 2012-09-27 | Discharge: 2012-09-27 | Disposition: A | Discharge status: Home / Self Care | Payer: BC Managed Care – PPO | Source: Ambulatory Visit | Attending: Internal Medicine | Admitting: Internal Medicine

## 2012-09-28 ENCOUNTER — Telehealth: Payer: Self-pay | Admitting: Internal Medicine

## 2012-09-28 NOTE — Telephone Encounter (Signed)
LMTCB on pharmacy line at [facility]

## 2012-09-28 NOTE — Telephone Encounter (Signed)
Spoke with [place], one of the pharmacist at [facility]. She states that there is no clarification needed and that the pt's medication was shipped to her today I called and spoke with the pt and made her aware She verbalized understanding and states nothing further needed

## 2012-09-29 ENCOUNTER — Encounter (HOSPITAL_COMMUNITY)
Admission: RE | Admit: 2012-09-29 | Discharge: 2012-09-29 | Disposition: A | Discharge status: Home / Self Care | Payer: BC Managed Care – PPO | Source: Ambulatory Visit | Attending: Internal Medicine | Admitting: Internal Medicine

## 2012-10-04 ENCOUNTER — Encounter (HOSPITAL_COMMUNITY)
Admission: RE | Admit: 2012-10-04 | Discharge: 2012-10-04 | Disposition: A | Discharge status: Home / Self Care | Payer: BC Managed Care – PPO | Source: Ambulatory Visit | Attending: Internal Medicine | Admitting: Internal Medicine

## 2012-10-04 DIAGNOSIS — Z5189 Encounter for other specified aftercare: Secondary | ICD-10-CM | POA: Insufficient documentation

## 2012-10-04 DIAGNOSIS — J449 Chronic obstructive pulmonary disease, unspecified: Secondary | ICD-10-CM | POA: Insufficient documentation

## 2012-10-04 DIAGNOSIS — J4489 Other specified chronic obstructive pulmonary disease: Secondary | ICD-10-CM | POA: Insufficient documentation

## 2012-10-06 ENCOUNTER — Encounter (HOSPITAL_COMMUNITY)
Admission: RE | Admit: 2012-10-06 | Discharge: 2012-10-06 | Disposition: A | Discharge status: Home / Self Care | Payer: BC Managed Care – PPO | Source: Ambulatory Visit | Attending: Internal Medicine | Admitting: Internal Medicine

## 2012-10-06 NOTE — Progress Notes (Signed)
Katie Greene 57 y.o. female Nutrition Note Spoke with pt. Pt is overweight. Pt reports her UBW was 140 lb before she quit using tobacco and started on prednisone. Pt's wt is up 37 lbs over the past 7 months. Pt reports she gained 15 lbs from 07/2012 to 09/2012. Wt loss tips discussed. There are some ways the pt can make her eating habits healthier.  Pt's Rate Your Plate results reviewed with pt.  Pt expressed understanding.  Pt avoids many salty foods; does not use canned/ convenience food.  Pt adds some salt/ salt-containing seasonings to food.  The role of sodium in lung disease reviewed with pt.  Nutrition Diagnosis   Food-and nutrition-related knowledge deficit related to lack of exposure to information as related to diagnosis of pulmonary disease   Overweight related to excessive energy intake as evidenced by a BMI of 28.6 Nutrition Rx/Est. Daily Nutrition Needs for: ? wt loss 1200-1600 Kcal  100-120 gm protein   1500 mg or less sodium      Nutrition Intervention   Pt's individual nutrition plan and goals reviewed with pt.   Benefits of adopting healthy eating habits discussed when pt's Rate Your Plate reviewed.   Handout given for 1500 kcal, 5 day menu ideas   Pt to attend the Nutrition and Lung Disease class   Continual client-centered nutrition education by RD, as part of interdisciplinary care. Goal(s) 1. Identify food quantities necessary to achieve wt loss of  -2# per week to a goal wt of 69.6-77.7 kg (153-171 lb) at graduation from pulmonary rehab. 2. Describe the benefit of including fruits, vegetables, whole grains, and low-fat dairy products in a healthy meal plan. Monitor and Evaluate progress toward nutrition goal with team.

## 2012-10-11 ENCOUNTER — Encounter (HOSPITAL_COMMUNITY)
Admission: RE | Admit: 2012-10-11 | Discharge: 2012-10-11 | Disposition: A | Discharge status: Home / Self Care | Payer: BC Managed Care – PPO | Source: Ambulatory Visit | Attending: Internal Medicine | Admitting: Internal Medicine

## 2012-10-13 ENCOUNTER — Encounter (HOSPITAL_COMMUNITY)
Admission: RE | Admit: 2012-10-13 | Discharge: 2012-10-13 | Disposition: A | Discharge status: Home / Self Care | Payer: BC Managed Care – PPO | Source: Ambulatory Visit | Attending: Internal Medicine | Admitting: Internal Medicine

## 2012-10-13 NOTE — Progress Notes (Addendum)
Nutrition Note Spoke with pt. Pt reports she has financial difficulties buying food "sometimes." Pt states, "If I don't cook, I try to get a balance meal somewhere." Pt previously given handout for community food/meal resources. Per discussion today, pt in the pre-contemplation state of wt loss. Pt states, "I want to lose wt, just no in the wrong places." Pt currently not actively trying to lose wt. Continue client-centered nutrition education by RD as part of interdisciplinary care.  Monitor and evaluate progress toward nutrition goal with team.

## 2012-10-14 ENCOUNTER — Encounter: Payer: Self-pay | Admitting: Internal Medicine

## 2012-10-14 NOTE — Telephone Encounter (Signed)
I spoke with pt. She c/o dry cough, scratchy throat. Very little wheezing when she coughs. No fever, chills, sweats, PND, nasal congestion, chest tx.   Per MR it sounds viral and could resolve itself. If she is not better tomorrow or the day after then to call our on call doctor or give [place] a call Monday morning.   I call pt and made her aware of this. She voiced her understanding and needed nothing further. Will also send this to her in [place].

## 2012-10-18 ENCOUNTER — Encounter (HOSPITAL_COMMUNITY): Payer: BC Managed Care – PPO

## 2012-10-20 ENCOUNTER — Encounter (HOSPITAL_COMMUNITY)
Admission: RE | Admit: 2012-10-20 | Discharge: 2012-10-20 | Disposition: A | Discharge status: Home / Self Care | Payer: BC Managed Care – PPO | Source: Ambulatory Visit | Attending: Internal Medicine | Admitting: Internal Medicine

## 2012-10-25 ENCOUNTER — Encounter (HOSPITAL_COMMUNITY)
Admission: RE | Admit: 2012-10-25 | Discharge: 2012-10-25 | Disposition: A | Discharge status: Home / Self Care | Payer: BC Managed Care – PPO | Source: Ambulatory Visit | Attending: Internal Medicine | Admitting: Internal Medicine

## 2012-10-27 ENCOUNTER — Encounter (HOSPITAL_COMMUNITY)
Admission: RE | Admit: 2012-10-27 | Discharge: 2012-10-27 | Disposition: A | Discharge status: Home / Self Care | Payer: BC Managed Care – PPO | Source: Ambulatory Visit | Attending: Internal Medicine | Admitting: Internal Medicine

## 2012-11-01 ENCOUNTER — Encounter (HOSPITAL_COMMUNITY)
Admission: RE | Admit: 2012-11-01 | Discharge: 2012-11-01 | Disposition: A | Discharge status: Home / Self Care | Payer: BC Managed Care – PPO | Source: Ambulatory Visit | Attending: Internal Medicine | Admitting: Internal Medicine

## 2012-11-01 DIAGNOSIS — Z5189 Encounter for other specified aftercare: Secondary | ICD-10-CM | POA: Insufficient documentation

## 2012-11-01 DIAGNOSIS — J449 Chronic obstructive pulmonary disease, unspecified: Secondary | ICD-10-CM | POA: Insufficient documentation

## 2012-11-01 DIAGNOSIS — J4489 Other specified chronic obstructive pulmonary disease: Secondary | ICD-10-CM | POA: Insufficient documentation

## 2012-11-03 ENCOUNTER — Encounter (HOSPITAL_COMMUNITY)
Admission: RE | Admit: 2012-11-03 | Discharge: 2012-11-03 | Disposition: A | Discharge status: Home / Self Care | Payer: BC Managed Care – PPO | Source: Ambulatory Visit | Attending: Internal Medicine | Admitting: Internal Medicine

## 2012-11-08 ENCOUNTER — Encounter (HOSPITAL_COMMUNITY)
Admission: RE | Admit: 2012-11-08 | Discharge: 2012-11-08 | Disposition: A | Discharge status: Home / Self Care | Payer: BC Managed Care – PPO | Source: Ambulatory Visit | Attending: Internal Medicine | Admitting: Internal Medicine

## 2012-11-10 ENCOUNTER — Encounter (HOSPITAL_COMMUNITY)
Admission: RE | Admit: 2012-11-10 | Discharge: 2012-11-10 | Disposition: A | Discharge status: Home / Self Care | Payer: BC Managed Care – PPO | Source: Ambulatory Visit | Attending: Internal Medicine | Admitting: Internal Medicine

## 2012-11-10 NOTE — Progress Notes (Signed)
On 11/08/12 Ms Booton ask staff member what to do about indigestion.  She was ask about frequency, intensity, and family history.  Previously had cardiologist that no longer has [place].  She was asymptomatic at that time.  She was encouraged to contact primary physician as soon as she arrived home.  Today she informed this Clinical research associate that she was seen yesterday and will have cardiology consult. Asymptomatic today.  Cardiac signs and symptoms were reviewed with her and also 911. Tolerated exercise well today.   [name] RN

## 2012-11-15 ENCOUNTER — Encounter (HOSPITAL_COMMUNITY)
Admission: RE | Admit: 2012-11-15 | Discharge: 2012-11-15 | Disposition: A | Discharge status: Home / Self Care | Payer: BC Managed Care – PPO | Source: Ambulatory Visit | Attending: Internal Medicine | Admitting: Internal Medicine

## 2012-11-17 ENCOUNTER — Encounter (HOSPITAL_COMMUNITY)
Admission: RE | Admit: 2012-11-17 | Discharge: 2012-11-17 | Disposition: A | Discharge status: Home / Self Care | Payer: BC Managed Care – PPO | Source: Ambulatory Visit | Attending: Internal Medicine | Admitting: Internal Medicine

## 2012-11-21 ENCOUNTER — Encounter: Payer: Self-pay | Admitting: Internal Medicine

## 2012-11-21 ENCOUNTER — Telehealth: Payer: Self-pay | Admitting: Internal Medicine

## 2012-11-21 MED ORDER — DOXYCYCLINE HYCLATE 100 MG PO TABS: ORAL_TABLET | ORAL | Status: DC

## 2012-11-21 NOTE — Telephone Encounter (Signed)
Called and spoke with pt She is c/o slight increase in SOB and prod cough x 1 wk Cough is prod with moderate, thick, greenish yellow sputum She denies any CP/chest tightness, wheezing, f/c/s Would like something called in Declined appt at this time She has a f/u in May Please advise, thanks! No Known Allergies

## 2012-11-21 NOTE — Telephone Encounter (Signed)
-----   Message from Abbe [place] to Kalman Shan, MD sent at 11/21/2012 10:51 AM ----- Good Morning, I have had a cough for about a wk with thick green mucas..My apt is not until 5/6 and I don't think I should wait that long to get some medication.Because it is green does this mean there may be some infection?Please advise.Medication can be called in to [facility] on [address]. Thanks, Pam   ANSWER  - likely mild aecopd  - yes could be infectin - Take doxycycline 100mg  po twice daily x 5 days; take after meals and avoid sunlight - if no better or worse, go to ER or call us for prednisone burst   Dr. Kalman Shan, M.D., [facility].C.P Pulmonary and Critical Care Medicine Staff Physician [facility]  Pulmonary and Critical Care Pager: [phone], If no answer or between  15:00h - 7:00h: call [phone]  11/21/2012 4:03 PM

## 2012-11-21 NOTE — Telephone Encounter (Signed)
Pt is aware of MR response. Rx has been sent in.

## 2012-11-22 ENCOUNTER — Telehealth: Payer: Self-pay | Admitting: Internal Medicine

## 2012-11-22 ENCOUNTER — Encounter (HOSPITAL_COMMUNITY): Payer: BC Managed Care – PPO

## 2012-11-22 MED ORDER — DOXYCYCLINE HYCLATE 100 MG PO TABS: ORAL_TABLET | ORAL | Status: DC

## 2012-11-22 NOTE — Telephone Encounter (Signed)
RX was printed. I have resent in RX for pt. Pt aware and needed nothing further

## 2012-11-24 ENCOUNTER — Encounter (HOSPITAL_COMMUNITY): Payer: BC Managed Care – PPO

## 2012-11-29 ENCOUNTER — Encounter (HOSPITAL_COMMUNITY)
Admission: RE | Admit: 2012-11-29 | Discharge: 2012-11-29 | Disposition: A | Discharge status: Home / Self Care | Payer: BC Managed Care – PPO | Source: Ambulatory Visit | Attending: Internal Medicine | Admitting: Internal Medicine

## 2012-12-01 ENCOUNTER — Encounter (HOSPITAL_COMMUNITY)
Admission: RE | Admit: 2012-12-01 | Discharge: 2012-12-01 | Disposition: A | Discharge status: Home / Self Care | Payer: BC Managed Care – PPO | Source: Ambulatory Visit | Attending: Internal Medicine | Admitting: Internal Medicine

## 2012-12-01 DIAGNOSIS — Z5189 Encounter for other specified aftercare: Secondary | ICD-10-CM | POA: Insufficient documentation

## 2012-12-01 DIAGNOSIS — J4489 Other specified chronic obstructive pulmonary disease: Secondary | ICD-10-CM | POA: Insufficient documentation

## 2012-12-01 DIAGNOSIS — J449 Chronic obstructive pulmonary disease, unspecified: Secondary | ICD-10-CM | POA: Insufficient documentation

## 2012-12-05 ENCOUNTER — Ambulatory Visit: Payer: BC Managed Care – PPO | Admitting: Internal Medicine

## 2012-12-06 ENCOUNTER — Encounter: Payer: Self-pay | Admitting: *Deleted

## 2012-12-06 ENCOUNTER — Telehealth: Payer: Self-pay | Admitting: Internal Medicine

## 2012-12-06 ENCOUNTER — Encounter: Payer: Self-pay | Admitting: Internal Medicine

## 2012-12-06 ENCOUNTER — Ambulatory Visit (INDEPENDENT_AMBULATORY_CARE_PROVIDER_SITE_OTHER): Payer: BC Managed Care – PPO | Admitting: Internal Medicine

## 2012-12-06 ENCOUNTER — Encounter (HOSPITAL_COMMUNITY)
Admission: RE | Admit: 2012-12-06 | Discharge: 2012-12-06 | Disposition: A | Discharge status: Home / Self Care | Payer: BC Managed Care – PPO | Source: Ambulatory Visit | Attending: Internal Medicine | Admitting: Internal Medicine

## 2012-12-06 VITALS — BP 130/80 | HR 98 | Temp 98.0°F | Ht 67.0 in | Wt 182.8 lb

## 2012-12-06 DIAGNOSIS — J449 Chronic obstructive pulmonary disease, unspecified: Secondary | ICD-10-CM

## 2012-12-06 DIAGNOSIS — R0982 Postnasal drip: Secondary | ICD-10-CM

## 2012-12-06 MED ORDER — FLUTICASONE PROPIONATE 50 MCG/ACT NA SUSP: 2.0000 | Freq: Every day | NASAL | Status: DC

## 2012-12-06 MED ORDER — BUDESONIDE-FORMOTEROL FUMARATE 80-4.5 MCG/ACT IN AERO: 2.0000 | INHALATION_SPRAY | Freq: Two times a day (BID) | RESPIRATORY_TRACT | Status: DC

## 2012-12-06 NOTE — Progress Notes (Signed)
Subjective:    Patient ID: Katie Greene, female    DOB: 1955-11-19, 57 y.o.   MRN: [id number]  HPI PCP is Garlan Fillers, MD  #COPD - Gold stage 3. On home o2 since July 2012. MM genotype  PFT FVC fev1 ratio BD fev1 TLC DLCO comment  03/31/12 1.65L/55% 0.78L/32% 47/58% 11% 4.8L/88% 6.5/23%% Gold stage 3 copd with post bd fev1 0.86L/36%  - RX spiriva/symbicort - Applied disability Nov 2013 - Pulm rehab Early 2014  #AECOPD  - February 2014 outpatient treatment - April 2014 telephone treatment for acute bronchitis without prednisone  #SMoking  - quit 2013  #Overweight  - Body mass index is 28.54 kg/(m^2).  - Body mass index is 29.39 kg/(m^2). on 09/19/2012 - Body mass index is 28.62 kg/(m^2). on 12/06/2012     #Lung cancer rrisk (age, smoking, strong family hx of several cancers) - CT 09/22/12 - emphysema with MPN of several    OV 12/06/2012    She called in 11/21/12 for bronchitis ans was given doxy without pred. Overall stable. She is attending pulmonary rehabilitation. COPD cat score is 25 and reflects baseline health. We discussed disability and I've explained to her that she is permanently disabled because of  COPD. discussed the need to lose weight. On the other issues that she continues to have silent postnasal drip that results in some amount of chronic cough. This is also baseline.  CAT COPD Symptom & Quality of Life Score (GSK trademark) 0 is no burden. 5 is highest burden 06/10/2012  06/29/2012  09/19/2012  12/06/2012   Never Cough -> Cough all the time 3 2 1 3   No phlegm in chest -> Chest is full of phlegm 2 3 1 3   No chest tightness -> Chest feels very tight 2 3 1 2   No dyspnea for 1 flight stairs/hill -> Very dyspneic for 1 flight of stairs 5 5 5 5   No limitations for ADL at home -> Very limited with ADL at home 5 3 3 4   Confident leaving home -> Not at all confident leaving home 3 4 2 2   Sleep soundly -> Do not sleep soundly because of lung condition 4 4 3  3   Lots of Energy -> No energy at all 4 3 4 3   TOTAL Score (max 40)  28 27 24 25      Past, Family, Social reviewed: no change since last visit    Review of Systems  Constitutional: Negative for fever and unexpected weight change.  HENT: Negative for ear pain, nosebleeds, congestion, sore throat, rhinorrhea, sneezing, trouble swallowing, dental problem, postnasal drip and sinus pressure.   Eyes: Negative for redness and itching.  Respiratory: Positive for cough. Negative for chest tightness, shortness of breath and wheezing.   Cardiovascular: Negative for palpitations and leg swelling.  Gastrointestinal: Negative for nausea and vomiting.  Genitourinary: Negative for dysuria.  Musculoskeletal: Negative for joint swelling.  Skin: Negative for rash.  Neurological: Negative for headaches.  Hematological: Does not bruise/bleed easily.  Psychiatric/Behavioral: Negative for dysphoric mood. The patient is not nervous/anxious.        Objective:   Physical Exam  Vitals reviewed. Constitutional: She is oriented to person, place, and time. She appears well-developed and well-nourished. No distress.  HENT:  Head: Normocephalic and atraumatic.  Right Ear: External ear normal.  Left Ear: External ear normal.  Mouth/Throat: Oropharynx is clear and moist. No oropharyngeal exudate.  She is overweight. Postnasal drip is present  Eyes: Conjunctivae and EOM  are normal. Pupils are equal, round, and reactive to light. Right eye exhibits no discharge. Left eye exhibits no discharge. No scleral icterus.  Neck: Normal range of motion. Neck supple. No JVD present. No tracheal deviation present. No thyromegaly present.  Cardiovascular: Normal rate, regular rhythm, normal heart sounds and intact distal pulses.  Exam reveals no gallop and no friction rub.   No murmur heard. Pulmonary/Chest: Effort normal and breath sounds normal. No respiratory distress. She has no wheezes. She has no rales. She exhibits no  tenderness.  Barrel chest with prolonged exhalation. No wheeze  Abdominal: Soft. Bowel sounds are normal. She exhibits no distension and no mass. There is no tenderness. There is no rebound and no guarding.  Musculoskeletal: Normal range of motion. She exhibits no edema and no tenderness.  Lymphadenopathy:    She has no cervical adenopathy.  Neurological: She is alert and oriented to person, place, and time. She has normal reflexes. No cranial nerve deficit. She exhibits normal muscle tone. Coordination normal.  Skin: Skin is warm and dry. No rash noted. She is not diaphoretic. No erythema. No pallor.  Psychiatric: She has a normal mood and affect. Her behavior is normal. Judgment and thought content normal.          Assessment & Plan:

## 2012-12-06 NOTE — Patient Instructions (Addendum)
#  COPD - Continue regular treatment as before including oxygen and inhalers - Pulmonary rehabilitation to continue - Focus on losing weight because in the future lung transplantation might be an option - Start N. acetylcysteine 600 mg twice a day  - [place] study published in Lancet British Journal in 2014 shows it helps   - this is not to make you feel better but to to prevent recurrent bronchitis episodes  - You are more likely to get this drug at [facility] or a vitamin store then at [employer] or [employer] or target  - You can also get this at website [employer]  - It functions as an anti-oxidant and there are minimal/none side effects   #Sinus draingae  -= This seems to be an issue -  take  fluticasone inhaler 2 squirts each nostril daily - 3% hypertonic nasal saline spray made by [employer] and do this 2 squirts each nostril at night  #Followup 3 months or sooner with COPD cat score

## 2012-12-06 NOTE — Telephone Encounter (Signed)
The pt is requesting another letter be faxed to [facility] dept at [phone], and to Leave Dept at [phone]. I have LMTCBx1 to ask the pt if she is ok for letter to state permanent disability. Carron Curie, CMA

## 2012-12-06 NOTE — Telephone Encounter (Signed)
I spoke with the pt and she states she does not feel like she can return to work and would be ok with the letter stating permanent disability, MR feels this is the best for the pt as well. Letter completed and faxed to numbers below. Pt is aware. Carron Curie, CMA

## 2012-12-07 ENCOUNTER — Telehealth: Payer: Self-pay | Admitting: Internal Medicine

## 2012-12-07 NOTE — Telephone Encounter (Signed)
3% hypertonic nasal saline spray made by [employer] and do this 2 squirts each nostril at night ----  Has been to 2-3 different pharmacies and they do not carry this type of nasal spray. They told not sure where she would be able to fine this. She is wanting to know if she can use plain nasal saline or what else are MR's recs. Pt aware he is on 11 PM ELINK and will not get a call back until tomorrow. Please advise MR thanks

## 2012-12-08 ENCOUNTER — Encounter (HOSPITAL_COMMUNITY)
Admission: RE | Admit: 2012-12-08 | Discharge: 2012-12-08 | Disposition: A | Discharge status: Home / Self Care | Payer: BC Managed Care – PPO | Source: Ambulatory Visit | Attending: Internal Medicine | Admitting: Internal Medicine

## 2012-12-09 DIAGNOSIS — R0982 Postnasal drip: Secondary | ICD-10-CM | POA: Insufficient documentation

## 2012-12-09 NOTE — Assessment & Plan Note (Signed)
#  Sinus draingae  -= This seems to be an issue -  take  fluticasone inhaler 2 squirts each nostril daily - 3% hypertonic nasal saline spray made by [employer] med and do this 2 squirts each nostril at night

## 2012-12-09 NOTE — Assessment & Plan Note (Signed)
COPD - Continue regular treatment as before including oxygen and inhalers - Pulmonary rehabilitation to continue - Focus on losing weight because in the future lung transplantation might be an option - Start N. acetylcysteine 600 mg twice a day  - [place] study published in Lancet British Journal in 2014 shows it helps   - this is not to make you feel better but to to prevent recurrent bronchitis episodes  - You are more likely to get this drug at [facility] or a vitamin store then at [employer] or [facility] or target  - You can also get this at website [employer]uk  - It functions as an anti-oxidant and there are minimal/none side effects   #Followup 3 months or sooner with COPD cat score

## 2012-12-09 NOTE — Telephone Encounter (Signed)
Spoke with patient, informed her of below per MR Verbalzied understanding and nothing further needed at this time

## 2012-12-09 NOTE — Telephone Encounter (Signed)
I have gotten it at [facility] myself. IT might be called hypertonic saline spray 2.7% formally caled NASAMIST extra strength hyperonic.  You can print her a picture from this weblink  [email or web address]   IF she cannot get it ok to use std nasal saline spray   Dr. Kalman Shan, M.D., [facility].C.P Pulmonary and Critical Care Medicine Staff Physician [facility] [place] Pulmonary and Critical Care Pager: [phone], If no answer or between  15:00h - 7:00h: call [phone]  12/09/2012 6:31 AM

## 2012-12-13 ENCOUNTER — Encounter (HOSPITAL_COMMUNITY)
Admission: RE | Admit: 2012-12-13 | Discharge: 2012-12-13 | Disposition: A | Discharge status: Home / Self Care | Payer: BC Managed Care – PPO | Source: Ambulatory Visit | Attending: Internal Medicine | Admitting: Internal Medicine

## 2012-12-14 NOTE — Progress Notes (Signed)
Note created on 12/14/2012.  Katie Greene was here on 513/2014 for exercise in Pul Rehab.  She will complete her sessions next week, feels like the program has been very helpful in increasing her stamina, decreasing her shortness of breath.  She has been compliant with attendance and able to increase work loads appropriately.  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  She will have her walk test next week and then begin in the maintenance.  She has been a pleasure to assist in improving her QOL.  [name] RN

## 2012-12-15 ENCOUNTER — Encounter (HOSPITAL_COMMUNITY)
Admission: RE | Admit: 2012-12-15 | Discharge: 2012-12-15 | Disposition: A | Discharge status: Home / Self Care | Payer: BC Managed Care – PPO | Source: Ambulatory Visit | Attending: Internal Medicine | Admitting: Internal Medicine

## 2012-12-20 ENCOUNTER — Encounter (HOSPITAL_COMMUNITY)
Admission: RE | Admit: 2012-12-20 | Discharge: 2012-12-20 | Disposition: A | Discharge status: Home / Self Care | Payer: BC Managed Care – PPO | Source: Ambulatory Visit | Attending: Internal Medicine | Admitting: Internal Medicine

## 2012-12-20 NOTE — Progress Notes (Signed)
Reviewed today with this patient her goal which she has been able to achieve and her plans to continue to exercise in Maintenance program.  She has a goal now to return to driving, states she is very pleased with her progress and is doing so much more around the house.  She has developed friendships with other patients and is very outgoing.  We look forward to seeing her improve more as she moves to maintenance. [name] RN

## 2012-12-22 ENCOUNTER — Encounter (HOSPITAL_COMMUNITY)
Admission: RE | Admit: 2012-12-22 | Discharge: 2012-12-22 | Disposition: A | Discharge status: Home / Self Care | Payer: BC Managed Care – PPO | Source: Ambulatory Visit | Attending: Internal Medicine | Admitting: Internal Medicine

## 2013-01-10 ENCOUNTER — Encounter: Payer: Self-pay | Admitting: Internal Medicine

## 2013-01-10 NOTE — Telephone Encounter (Signed)
Good Morning, On my last visit in May Dr Marchelle Gearing talked to me about a study which included injections twice a wk and would also have to keep a journal,and the office is to contact me with the rest of the details.Could you please provide status on this at your convenience.  Thanks, Katie Greene Mr please advise about the above. Katie Greene, CMA

## 2013-01-17 NOTE — Telephone Encounter (Signed)
Nothing further is needed. 

## 2013-01-26 ENCOUNTER — Encounter: Payer: Self-pay | Admitting: Internal Medicine

## 2013-01-27 NOTE — Telephone Encounter (Signed)
Called and spoke with the pt to verify the msg She is needing all of her records since started seeing MR She states has already signed release for records with our medical records dept, but has yet to hear from them  I provided her with their phone number and advised that she call them for her records She verbalized understanding ands states nothing further needed 

## 2013-01-27 NOTE — Telephone Encounter (Signed)
Called and spoke with the pt to verify the msg She is needing all of her records since started seeing MR She states has already signed release for records with our medical records dept, but has yet to hear from them  I provided her with their phone number and advised that she call them for her records She verbalized understanding ands states nothing further needed

## 2013-02-02 ENCOUNTER — Encounter (HOSPITAL_COMMUNITY)
Admission: RE | Admit: 2013-02-02 | Discharge: 2013-02-02 | Disposition: A | Discharge status: Home / Self Care | Payer: BC Managed Care – PPO | Source: Ambulatory Visit | Attending: Internal Medicine | Admitting: Internal Medicine

## 2013-02-02 DIAGNOSIS — J4489 Other specified chronic obstructive pulmonary disease: Secondary | ICD-10-CM | POA: Insufficient documentation

## 2013-02-02 DIAGNOSIS — J449 Chronic obstructive pulmonary disease, unspecified: Secondary | ICD-10-CM | POA: Insufficient documentation

## 2013-02-02 DIAGNOSIS — Z5189 Encounter for other specified aftercare: Secondary | ICD-10-CM | POA: Insufficient documentation

## 2013-02-02 NOTE — Progress Notes (Signed)
Katie Greene participated in Pulmonary Rehab Maintenance Program today.  No difficulties encountered, vital signs and oxygen saturations stable.  [name] RN

## 2013-02-07 ENCOUNTER — Encounter (HOSPITAL_COMMUNITY)
Admission: RE | Admit: 2013-02-07 | Discharge: 2013-02-07 | Disposition: A | Discharge status: Home / Self Care | Payer: BC Managed Care – PPO | Source: Ambulatory Visit | Attending: Internal Medicine | Admitting: Internal Medicine

## 2013-02-09 ENCOUNTER — Encounter (HOSPITAL_COMMUNITY)
Admission: RE | Admit: 2013-02-09 | Discharge: 2013-02-09 | Disposition: A | Discharge status: Home / Self Care | Payer: BC Managed Care – PPO | Source: Ambulatory Visit | Attending: Internal Medicine | Admitting: Internal Medicine

## 2013-02-14 ENCOUNTER — Encounter (HOSPITAL_COMMUNITY)
Admission: RE | Admit: 2013-02-14 | Discharge: 2013-02-14 | Disposition: A | Discharge status: Home / Self Care | Payer: BC Managed Care – PPO | Source: Ambulatory Visit | Attending: Internal Medicine | Admitting: Internal Medicine

## 2013-02-16 ENCOUNTER — Encounter (HOSPITAL_COMMUNITY): Payer: BC Managed Care – PPO

## 2013-02-21 ENCOUNTER — Encounter (HOSPITAL_COMMUNITY)
Admission: RE | Admit: 2013-02-21 | Discharge: 2013-02-21 | Disposition: A | Discharge status: Home / Self Care | Payer: BC Managed Care – PPO | Source: Ambulatory Visit | Attending: Internal Medicine | Admitting: Internal Medicine

## 2013-02-23 ENCOUNTER — Encounter (HOSPITAL_COMMUNITY)
Admission: RE | Admit: 2013-02-23 | Discharge: 2013-02-23 | Disposition: A | Discharge status: Home / Self Care | Payer: BC Managed Care – PPO | Source: Ambulatory Visit | Attending: Internal Medicine | Admitting: Internal Medicine

## 2013-02-28 ENCOUNTER — Encounter (HOSPITAL_COMMUNITY)
Admission: RE | Admit: 2013-02-28 | Discharge: 2013-02-28 | Disposition: A | Discharge status: Home / Self Care | Payer: BC Managed Care – PPO | Source: Ambulatory Visit | Attending: Internal Medicine | Admitting: Internal Medicine

## 2013-03-02 ENCOUNTER — Encounter (HOSPITAL_COMMUNITY): Payer: BC Managed Care – PPO

## 2013-03-02 ENCOUNTER — Telehealth (HOSPITAL_COMMUNITY): Payer: Self-pay | Admitting: *Deleted

## 2013-03-02 NOTE — Telephone Encounter (Signed)
Telephone call placed to Katie Greene on 03/01/13 pm regarding follow up of heart rate at discharge post exercise on 02/28/13.  Her heart rate was 15-20 beats above normal.  She had taken 2 blood pressure pills that am.  Her exit heart rate was 118 and on 03/01/13 she was checking at home and still in the 110 range. We discussed not exercising today, if heart rate above 100, call MD office for follow up. Otherwise she stated she does not have any symptoms.  [name] RN

## 2013-03-07 ENCOUNTER — Encounter (HOSPITAL_COMMUNITY): Payer: BC Managed Care – PPO | Attending: Internal Medicine

## 2013-03-07 DIAGNOSIS — Z5189 Encounter for other specified aftercare: Secondary | ICD-10-CM | POA: Insufficient documentation

## 2013-03-07 DIAGNOSIS — J449 Chronic obstructive pulmonary disease, unspecified: Secondary | ICD-10-CM | POA: Insufficient documentation

## 2013-03-07 DIAGNOSIS — J4489 Other specified chronic obstructive pulmonary disease: Secondary | ICD-10-CM | POA: Insufficient documentation

## 2013-03-09 ENCOUNTER — Encounter (HOSPITAL_COMMUNITY): Admission: RE | Admit: 2013-03-09 | Payer: BC Managed Care – PPO | Source: Ambulatory Visit

## 2013-03-13 ENCOUNTER — Ambulatory Visit (INDEPENDENT_AMBULATORY_CARE_PROVIDER_SITE_OTHER): Payer: BC Managed Care – PPO | Admitting: Internal Medicine

## 2013-03-13 VITALS — BP 126/78 | HR 98 | Temp 97.6°F | Ht 67.0 in | Wt 177.8 lb

## 2013-03-13 DIAGNOSIS — J449 Chronic obstructive pulmonary disease, unspecified: Secondary | ICD-10-CM

## 2013-03-13 DIAGNOSIS — R0982 Postnasal drip: Secondary | ICD-10-CM

## 2013-03-13 MED ORDER — BUDESONIDE-FORMOTEROL FUMARATE 80-4.5 MCG/ACT IN AERO: 2.0000 | INHALATION_SPRAY | Freq: Two times a day (BID) | RESPIRATORY_TRACT | Status: DC

## 2013-03-13 MED ORDER — TIOTROPIUM BROMIDE MONOHYDRATE 18 MCG IN CAPS: 18.0000 ug | ORAL_CAPSULE | Freq: Every day | RESPIRATORY_TRACT | Status: DC

## 2013-03-13 NOTE — Progress Notes (Signed)
Subjective:    Patient ID: Katie Greene, female    DOB: [birth year], 57 y.o.   MRN: [id number]  HPI PCP is Garlan Fillers, MD  #COPD - Gold stage 3. On home o2 since July 2012. MM genotype  PFT FVC fev1 ratio BD fev1 TLC DLCO comment  03/31/12 1.65L/55% 0.78L/32% 47/58% 11% 4.8L/88% 6.5/23%% Gold stage 3 copd with post bd fev1 0.86L/36%  - RX spiriva/symbicort - Applied disability Nov 2013 - Pulm rehab Early 2014  #AECOPD  - February 2014 outpatient treatment - April 2014 telephone treatment for acute bronchitis without prednisone  #SMoking  - quit 2013  #Overweight  - Body mass index is 28.54 kg/(m^2).  - Body mass index is 29.39 kg/(m^2). on 09/19/2012 - Body mass index is 28.62 kg/(m^2). on 12/06/2012 Body mass index is 27.84 kg/(m^2). on 03/13/2013      #Lung cancer rrisk (age, smoking, strong family hx of several cancers) - CT 09/22/12 - emphysema with MPN of several - next ct to be done spring 2015   OV 12/06/2012    She called in 11/21/12 for bronchitis ans was given doxy without pred. Overall stable. She is attending pulmonary rehabilitation. COPD cat score is 25 and reflects baseline health. We discussed disability and I've explained to her that she is permanently disabled because of  COPD. discussed the need to lose weight. On the other issues that she continues to have silent postnasal drip that results in some amount of chronic cough. This is also baseline.  REC  #COPD  - Continue regular treatment as before including oxygen and inhalers  - Pulmonary rehabilitation to continue  - Focus on losing weight because in the future lung transplantation might be an option  - Start N. acetylcysteine 600 mg twice a day  - [place] study published in Lancet [employer] Journal in 2014 shows it helps  - this is not to make you feel better but to to prevent recurrent bronchitis episodes  - You are more likely to get this drug at [facility] or a vitamin store then at [employer] or  [employer] or [employer]  - You can also get this at website [employer]co[employer]uk  - It functions as an anti-oxidant and there are minimal/none side effects  #Sinus draingae  -= This seems to be an issue  - take fluticasone inhaler 2 squirts each nostril daily  - 3% hypertonic nasal saline spray made by [employer] [facility] and do this 2 squirts each nostril at night  #Followup  3 months or sooner with COPD cat score  OV 03/13/2013  FU COPD and nasal drainge  Overall stable. COPD CAT SCore is 26 and baeline: Symptom details are as below. USes o2. Losing weight intentionally; also advised by cardiologist to lose weight. Sinus drainage is also better.    CAT COPD Symptom & Quality of Life Score (GSK trademark) 0 is no burden. 5 is highest burden 06/10/2012  06/29/2012  09/19/2012  12/06/2012  03/13/2013   Never Cough -> Cough all the time 3 2 1 3 2   No phlegm in chest -> Chest is full of phlegm 2 3 1 3 2   No chest tightness -> Chest feels very tight 2 3 1 2 3   No dyspnea for 1 flight stairs/hill -> Very dyspneic for 1 flight of stairs 5 5 5 5 5   No limitations for ADL at home -> Very limited with ADL at home 5 3 3 4 4   Confident leaving home -> Not at all confident leaving  home 3 4 2 2 3   Sleep soundly -> Do not sleep soundly because of lung condition 4 4 3 3 4   Lots of Energy -> No energy at all 4 3 4 3 3   TOTAL Score (max 40)  28 27 24 25 26      Past, Family, Social reviewed: no change since last visit      Review of Systems  Constitutional: Negative for fever and unexpected weight change.  HENT: Negative for ear pain, nosebleeds, congestion, sore throat, rhinorrhea, sneezing, trouble swallowing, dental problem, postnasal drip and sinus pressure.   Eyes: Negative for redness and itching.  Respiratory: Positive for shortness of breath. Negative for cough, chest tightness and wheezing.   Cardiovascular: Negative for palpitations and leg swelling.  Gastrointestinal: Negative for nausea and  vomiting.  Genitourinary: Negative for dysuria.  Musculoskeletal: Negative for joint swelling.  Skin: Negative for rash.  Neurological: Negative for headaches.  Hematological: Does not bruise/bleed easily.  Psychiatric/Behavioral: Negative for dysphoric mood. The patient is not nervous/anxious.        Objective:   Physical Exam Vitals reviewed. Constitutional: She is oriented to person, place, and time. She appears well-developed and well-nourished. No distress.  HENT:  Head: Normocephalic and atraumatic.  Right Ear: External ear normal.  Left Ear: External ear normal.  Mouth/Throat: Oropharynx is clear and moist. No oropharyngeal exudate.  She is overweight. Postnasal drip is present but improved Eyes: Conjunctivae and EOM are normal. Pupils are equal, round, and reactive to light. Right eye exhibits no discharge. Left eye exhibits no discharge. No scleral icterus.  Neck: Normal range of motion. Neck supple. No JVD present. No tracheal deviation present. No thyromegaly present.  Cardiovascular: Normal rate, regular rhythm, normal heart sounds and intact distal pulses.  Exam reveals no gallop and no friction rub.   No murmur heard. Pulmonary/Chest: Effort normal and breath sounds normal. No respiratory distress. She has no wheezes. She has no rales. She exhibits no tenderness.  Barrel chest with prolonged exhalation. No wheeze  Abdominal: Soft. Bowel sounds are normal. She exhibits no distension and no mass. There is no tenderness. There is no rebound and no guarding.  Musculoskeletal: Normal range of motion. She exhibits no edema and no tenderness.  Lymphadenopathy:    She has no cervical adenopathy.  Neurological: She is alert and oriented to person, place, and time. She has normal reflexes. No cranial nerve deficit. She exhibits normal muscle tone. Coordination normal.  Skin: Skin is warm and dry. No rash noted. She is not diaphoretic. No erythema. No pallor.  Psychiatric: She has  a normal mood and affect. Her behavior is normal. Judgment and thought content normal.           Assessment & Plan:

## 2013-03-13 NOTE — Patient Instructions (Addendum)
#  COPD - Continue regular treatment as before including oxygen and inhalers - Pulmonary rehabilitation to continue - Focus on losing weight because in the future lung transplantation might be an option - Continue  N. acetylcysteine 600 mg twice a day - flu shot in fall     #Sinus draingae  - continue  fluticasone inhaler 2 squirts each nostril daily - continue 3% hypertonic nasal saline spray made by [employer] [facility] and do this 2 squirts each nostril at night  #Followup 3 months or sooner with COPD cat score

## 2013-03-14 ENCOUNTER — Encounter (HOSPITAL_COMMUNITY): Payer: BC Managed Care – PPO

## 2013-03-15 ENCOUNTER — Encounter: Payer: Self-pay | Admitting: Internal Medicine

## 2013-03-15 NOTE — Assessment & Plan Note (Signed)
#  Sinus draingae  - continue  fluticasone inhaler 2 squirts each nostril daily - continue 3% hypertonic nasal saline spray made by [employer] [facility] and do this 2 squirts each nostril at night  

## 2013-03-15 NOTE — Assessment & Plan Note (Signed)
#  COPD - Continue regular treatment as before including oxygen and inhalers - Pulmonary rehabilitation to continue - Focus on losing weight because in the future lung transplantation might be an option - Continue  N. acetylcysteine 600 mg twice a day - flu shot in fall   #Followup 3 months or sooner with COPD cat score

## 2013-03-16 ENCOUNTER — Encounter (HOSPITAL_COMMUNITY): Payer: BC Managed Care – PPO

## 2013-03-21 ENCOUNTER — Encounter (HOSPITAL_COMMUNITY): Payer: BC Managed Care – PPO

## 2013-03-23 ENCOUNTER — Encounter (HOSPITAL_COMMUNITY): Payer: BC Managed Care – PPO

## 2013-03-28 ENCOUNTER — Encounter (HOSPITAL_COMMUNITY): Admission: RE | Admit: 2013-03-28 | Payer: BC Managed Care – PPO | Source: Ambulatory Visit

## 2013-03-30 ENCOUNTER — Encounter (HOSPITAL_COMMUNITY): Payer: BC Managed Care – PPO

## 2013-04-04 ENCOUNTER — Encounter (HOSPITAL_COMMUNITY): Payer: BC Managed Care – PPO

## 2013-04-04 DIAGNOSIS — J4489 Other specified chronic obstructive pulmonary disease: Secondary | ICD-10-CM | POA: Insufficient documentation

## 2013-04-04 DIAGNOSIS — Z5189 Encounter for other specified aftercare: Secondary | ICD-10-CM | POA: Insufficient documentation

## 2013-04-04 DIAGNOSIS — J449 Chronic obstructive pulmonary disease, unspecified: Secondary | ICD-10-CM | POA: Insufficient documentation

## 2013-04-06 ENCOUNTER — Encounter (HOSPITAL_COMMUNITY): Payer: BC Managed Care – PPO

## 2013-04-11 ENCOUNTER — Encounter (HOSPITAL_COMMUNITY): Payer: BC Managed Care – PPO

## 2013-04-13 ENCOUNTER — Encounter (HOSPITAL_COMMUNITY)
Admission: RE | Admit: 2013-04-13 | Discharge: 2013-04-13 | Disposition: A | Discharge status: Home / Self Care | Payer: Self-pay | Source: Ambulatory Visit | Attending: Internal Medicine | Admitting: Internal Medicine

## 2013-04-18 ENCOUNTER — Encounter (HOSPITAL_COMMUNITY)
Admission: RE | Admit: 2013-04-18 | Discharge: 2013-04-18 | Disposition: A | Discharge status: Home / Self Care | Payer: Self-pay | Source: Ambulatory Visit | Attending: Internal Medicine | Admitting: Internal Medicine

## 2013-04-18 ENCOUNTER — Encounter: Payer: Self-pay | Admitting: Internal Medicine

## 2013-04-20 ENCOUNTER — Encounter (HOSPITAL_COMMUNITY)
Admission: RE | Admit: 2013-04-20 | Discharge: 2013-04-20 | Disposition: A | Discharge status: Home / Self Care | Payer: Self-pay | Source: Ambulatory Visit | Attending: Internal Medicine | Admitting: Internal Medicine

## 2013-04-24 ENCOUNTER — Ambulatory Visit (INDEPENDENT_AMBULATORY_CARE_PROVIDER_SITE_OTHER): Payer: BC Managed Care – PPO

## 2013-04-24 DIAGNOSIS — Z23 Encounter for immunization: Secondary | ICD-10-CM

## 2013-04-25 ENCOUNTER — Encounter (HOSPITAL_COMMUNITY)
Admission: RE | Admit: 2013-04-25 | Discharge: 2013-04-25 | Disposition: A | Discharge status: Home / Self Care | Payer: Self-pay | Source: Ambulatory Visit | Attending: Internal Medicine | Admitting: Internal Medicine

## 2013-04-27 ENCOUNTER — Encounter (HOSPITAL_COMMUNITY)
Admission: RE | Admit: 2013-04-27 | Discharge: 2013-04-27 | Disposition: A | Discharge status: Home / Self Care | Payer: Self-pay | Source: Ambulatory Visit | Attending: Internal Medicine | Admitting: Internal Medicine

## 2013-05-02 ENCOUNTER — Encounter (HOSPITAL_COMMUNITY)
Admission: RE | Admit: 2013-05-02 | Discharge: 2013-05-02 | Disposition: A | Discharge status: Home / Self Care | Payer: Self-pay | Source: Ambulatory Visit | Attending: Internal Medicine | Admitting: Internal Medicine

## 2013-05-04 ENCOUNTER — Encounter (HOSPITAL_COMMUNITY)
Admission: RE | Admit: 2013-05-04 | Discharge: 2013-05-04 | Disposition: A | Discharge status: Home / Self Care | Payer: Self-pay | Source: Ambulatory Visit | Attending: Internal Medicine | Admitting: Internal Medicine

## 2013-05-04 DIAGNOSIS — Z5189 Encounter for other specified aftercare: Secondary | ICD-10-CM | POA: Insufficient documentation

## 2013-05-04 DIAGNOSIS — J4489 Other specified chronic obstructive pulmonary disease: Secondary | ICD-10-CM | POA: Insufficient documentation

## 2013-05-04 DIAGNOSIS — J449 Chronic obstructive pulmonary disease, unspecified: Secondary | ICD-10-CM | POA: Insufficient documentation

## 2013-05-09 ENCOUNTER — Encounter (HOSPITAL_COMMUNITY)
Admission: RE | Admit: 2013-05-09 | Discharge: 2013-05-09 | Disposition: A | Discharge status: Home / Self Care | Payer: Self-pay | Source: Ambulatory Visit | Attending: Internal Medicine | Admitting: Internal Medicine

## 2013-05-11 ENCOUNTER — Encounter (HOSPITAL_COMMUNITY)
Admission: RE | Admit: 2013-05-11 | Discharge: 2013-05-11 | Disposition: A | Discharge status: Home / Self Care | Payer: Self-pay | Source: Ambulatory Visit | Attending: Internal Medicine | Admitting: Internal Medicine

## 2013-05-16 ENCOUNTER — Encounter (HOSPITAL_COMMUNITY)
Admission: RE | Admit: 2013-05-16 | Discharge: 2013-05-16 | Disposition: A | Discharge status: Home / Self Care | Payer: Self-pay | Source: Ambulatory Visit | Attending: Internal Medicine | Admitting: Internal Medicine

## 2013-05-18 ENCOUNTER — Encounter (HOSPITAL_COMMUNITY)
Admission: RE | Admit: 2013-05-18 | Discharge: 2013-05-18 | Disposition: A | Discharge status: Home / Self Care | Payer: Self-pay | Source: Ambulatory Visit | Attending: Internal Medicine | Admitting: Internal Medicine

## 2013-05-23 ENCOUNTER — Encounter (HOSPITAL_COMMUNITY)
Admission: RE | Admit: 2013-05-23 | Discharge: 2013-05-23 | Disposition: A | Discharge status: Home / Self Care | Payer: Self-pay | Source: Ambulatory Visit | Attending: Internal Medicine | Admitting: Internal Medicine

## 2013-05-25 ENCOUNTER — Encounter (HOSPITAL_COMMUNITY)
Admission: RE | Admit: 2013-05-25 | Discharge: 2013-05-25 | Disposition: A | Discharge status: Home / Self Care | Payer: Self-pay | Source: Ambulatory Visit | Attending: Internal Medicine | Admitting: Internal Medicine

## 2013-05-30 ENCOUNTER — Encounter (HOSPITAL_COMMUNITY)
Admission: RE | Admit: 2013-05-30 | Discharge: 2013-05-30 | Disposition: A | Discharge status: Home / Self Care | Payer: Self-pay | Source: Ambulatory Visit | Attending: Internal Medicine | Admitting: Internal Medicine

## 2013-06-01 ENCOUNTER — Encounter (HOSPITAL_COMMUNITY)
Admission: RE | Admit: 2013-06-01 | Discharge: 2013-06-01 | Disposition: A | Discharge status: Home / Self Care | Payer: Self-pay | Source: Ambulatory Visit | Attending: Internal Medicine | Admitting: Internal Medicine

## 2013-06-01 NOTE — Progress Notes (Signed)
Katie Greene's weight has been steadily increasing over the last month.  She has gained 10 pounds, states she is not eating more, 1+ edema of feet, she has noticed abdominal girth has increased, no SOB, slight wheeze heard on right lower lobe once then not present later.  Called Dr. Verl Dicker office and talked to Katie Greene.  She will call Katie Greene with further instructions after talking with Dr. Jacinto Halim.  Blood pressure 120/78, heart rate 70's.

## 2013-06-06 ENCOUNTER — Encounter (HOSPITAL_COMMUNITY)
Admission: RE | Admit: 2013-06-06 | Discharge: 2013-06-06 | Disposition: A | Discharge status: Home / Self Care | Payer: Self-pay | Source: Ambulatory Visit | Attending: Internal Medicine | Admitting: Internal Medicine

## 2013-06-06 DIAGNOSIS — J449 Chronic obstructive pulmonary disease, unspecified: Secondary | ICD-10-CM | POA: Insufficient documentation

## 2013-06-06 DIAGNOSIS — J4489 Other specified chronic obstructive pulmonary disease: Secondary | ICD-10-CM | POA: Insufficient documentation

## 2013-06-06 DIAGNOSIS — Z5189 Encounter for other specified aftercare: Secondary | ICD-10-CM | POA: Insufficient documentation

## 2013-06-08 ENCOUNTER — Encounter (HOSPITAL_COMMUNITY): Payer: BC Managed Care – PPO

## 2013-06-13 ENCOUNTER — Encounter (HOSPITAL_COMMUNITY)
Admission: RE | Admit: 2013-06-13 | Discharge: 2013-06-13 | Disposition: A | Discharge status: Home / Self Care | Payer: Self-pay | Source: Ambulatory Visit | Attending: Internal Medicine | Admitting: Internal Medicine

## 2013-06-13 NOTE — Progress Notes (Signed)
Pt tachycardic upon arrival to pulmonary rehab today. HR- 128, sinus tach, BP- 104/70 O2 sat-93% on 3L cont pulse.  Pt c/o generalized malaise, dizziness and cramps. Pt reports these symptoms have worsened since starting spironolactone 50mg  last Thursday.   Pt did not exercise. Phone call to Dr. Jacinto Halim nurse to report pt symptoms and heart rate. Dr. Jacinto Halim nurse advised pt to await return call later today with recommendations from Dr. Jacinto Halim, perhaps drawing labs today. Pt advised to not drive since she feels dizzy.  Pt called family member to drive her home.  Understanding verbalized.

## 2013-06-15 ENCOUNTER — Encounter (HOSPITAL_COMMUNITY): Payer: BC Managed Care – PPO

## 2013-06-16 ENCOUNTER — Encounter: Payer: Self-pay | Admitting: Internal Medicine

## 2013-06-16 ENCOUNTER — Ambulatory Visit (INDEPENDENT_AMBULATORY_CARE_PROVIDER_SITE_OTHER): Payer: BC Managed Care – PPO | Admitting: Internal Medicine

## 2013-06-16 VITALS — BP 126/78 | HR 117 | Ht 67.0 in | Wt 191.0 lb

## 2013-06-16 DIAGNOSIS — J4489 Other specified chronic obstructive pulmonary disease: Secondary | ICD-10-CM

## 2013-06-16 DIAGNOSIS — R0982 Postnasal drip: Secondary | ICD-10-CM

## 2013-06-16 DIAGNOSIS — J449 Chronic obstructive pulmonary disease, unspecified: Secondary | ICD-10-CM

## 2013-06-16 NOTE — Patient Instructions (Addendum)
#  COPD - STable disease - Continue regular treatment as before including oxygen and inhalers -  Maintenance Pulmonary rehabilitation to continue - Focus on losing weight because in the future lung transplantation might be an option - Continue  N. acetylcysteine 600 mg twice a day - glad you had flu shot; cMA will document it - we will screen you for ressearch studies in COPD  - CMA will get your complete blood count with differential from PCP Garlan Fillers, MD office     #Sinus draingae  - continue  fluticasone inhaler 2 squirts each nostril daily - continue 3% hypertonic nasal saline spray made by [employer] [facility] and do this 2 squirts each nostril at night  #Followup 3 months or sooner with COPD cat score

## 2013-06-16 NOTE — Progress Notes (Signed)
Subjective:    Patient ID: Katie Greene, female    DOB: 03-06-1956, 57 y.o.   MRN: [id number]  HPI   #COPD - Gold stage 3. On home o2 since July 2012. MM genotype  PFT FVC fev1 ratio BD fev1 TLC DLCO comment  03/31/12 1.65L/55% 0.78L/32% 47/58% 11% 4.8L/88% 6.5/23%% Gold stage 3 copd with post bd fev1 0.86L/36%  - RX spiriva/symbicort - Applied disability Nov 2013 - Pulm rehab Early 2014  #AECOPD  - February 2014 outpatient treatment - April 2014 telephone treatment for acute bronchitis without prednisone  #SMoking  - quit 2013  #Overweight  - Body mass index is 28.54 kg/(m^2).  - Body mass index is 29.39 kg/(m^2). on 09/19/2012 - Body mass index is 28.62 kg/(m^2). on 12/06/2012 Body mass index is 27.84 kg/(m^2). on 03/13/2013 - Body mass index is 29.91 kg/(m^2).  On 06/16/2013     #Lung cancer rrisk (age, smoking, strong family hx of several cancers) - CT 09/22/12 - emphysema with MPN of several - next ct to be done spring 2015     OV 03/13/2013  FU COPD and nasal drainge  Overall stable. COPD CAT SCore is 26 and baeline: Symptom details are as below. USes o2. Losing weight intentionally; also advised by cardiologist to lose weight. Sinus drainage is also better.  REC  #COPD - Continue regular treatment as before including oxygen and inhalers - Pulmonary rehabilitation to continue - Focus on losing weight because in the future lung transplantation might be an option - Continue  N. acetylcysteine 600 mg twice a day - flu shot in fall   #Sinus draingae  - continue  fluticasone inhaler 2 squirts each nostril daily - continue 3% hypertonic nasal saline spray made by [employer] [facility] and do this 2 squirts each nostril at night  #Followup 3 months or sooner with COPD cat score    OV 06/16/2013  Followup COPD and chronic sinus drainage  - Overall doing well. COPD symptoms are stable and reflected by a stable COPD cat score of 24 reflected below. She has already had  a flu shot. She is enjoying maintenance rehabilitation. She continues with oxygen and her medications without fail.  She is interested in participating in COPD research studies  Of note, she seen the cardiologist and been on Lasix therapy. This is giving some cramps and her medicines are being adjusted. Today she had some blood work   CAT COPD Symptom & Quality of Life Score (GSK trademark) 0 is no burden. 5 is highest burden 06/10/2012  12/06/2012  03/13/2013  06/16/2013   Never Cough -> Cough all the time 3 3 2 2   No phlegm in chest -> Chest is full of phlegm 2 3 2 3   No chest tightness -> Chest feels very tight 2 2 3 2   No dyspnea for 1 flight stairs/hill -> Very dyspneic for 1 flight of stairs 5 5 5 5   No limitations for ADL at home -> Very limited with ADL at home 5 4 4 3   Confident leaving home -> Not at all confident leaving home 3 2 3 2   Sleep soundly -> Do not sleep soundly because of lung condition 4 3 4 4   Lots of Energy -> No energy at all 4 3 3 3   TOTAL Score (max 40)  28 25 26 24      Review of Systems  Constitutional: Negative for fever and unexpected weight change.  HENT: Negative for congestion, dental problem, ear pain, nosebleeds, postnasal  drip, rhinorrhea, sinus pressure, sneezing, sore throat and trouble swallowing.   Eyes: Negative for redness and itching.  Respiratory: Positive for shortness of breath. Negative for cough, chest tightness and wheezing.   Cardiovascular: Positive for leg swelling. Negative for palpitations.  Gastrointestinal: Negative for nausea and vomiting.  Genitourinary: Negative for dysuria.  Musculoskeletal: Negative for joint swelling.  Skin: Negative for rash.  Neurological: Negative for headaches.  Hematological: Does not bruise/bleed easily.  Psychiatric/Behavioral: Negative for dysphoric mood. The patient is not nervous/anxious.    Current outpatient prescriptions:Acetylcysteine 600 MG CAPS, Take 1 capsule by mouth 2 (two) times  daily., Disp: , Rfl: ;  albuterol (VENTOLIN HFA) 108 (90 BASE) MCG/ACT inhaler, Inhale 2 puffs into the lungs every 6 (six) hours as needed., Disp: , Rfl: ;  amLODipine (NORVASC) 10 MG tablet, Take 10 mg by mouth daily., Disp: , Rfl: ;  aspirin 81 MG tablet, Take 81 mg by mouth daily.  , Disp: , Rfl:  budesonide-formoterol (SYMBICORT) 80-4.5 MCG/ACT inhaler, Inhale 2 puffs into the lungs 2 (two) times daily., Disp: 1 Inhaler, Rfl: 6;  fluticasone (FLONASE) 50 MCG/ACT nasal spray, Place 2 sprays into the nose daily., Disp: 16 g, Rfl: 2;  losartan-hydrochlorothiazide (HYZAAR) 100-25 MG per tablet, Take 1 tablet by mouth daily., Disp: , Rfl: ;  meloxicam (MOBIC) 7.5 MG tablet, 1 tablet Daily., Disp: , Rfl:  pravastatin (PRAVACHOL) 40 MG tablet, Take 40 mg by mouth daily., Disp: , Rfl: ;  spironolactone (ALDACTONE) 50 MG tablet, Take 50 mg by mouth daily., Disp: , Rfl: ;  temazepam (RESTORIL) 15 MG capsule, 1 tablet Daily., Disp: , Rfl: ;  tiotropium (SPIRIVA) 18 MCG inhalation capsule, Place 1 capsule (18 mcg total) into inhaler and inhale daily., Disp: 30 capsule, Rfl: 6;  TOPROL XL 50 MG 24 hr tablet, 1 tablet Daily., Disp: , Rfl:       Objective:   Physical Exam    Vitals reviewed. Constitutional: She is oriented to person, place, and time. She appears well-developed and well-nourished. No distress.  HENT:  Head: Normocephalic and atraumatic.  Right Ear: External ear normal.  Left Ear: External ear normal.  Mouth/Throat: Oropharynx is clear and moist. No oropharyngeal exudate.  She is overweight. Postnasal drip is present but improved Eyes: Conjunctivae and EOM are normal. Pupils are equal, round, and reactive to light. Right eye exhibits no discharge. Left eye exhibits no discharge. No scleral icterus.  Neck: Normal range of motion. Neck supple. No JVD present. No tracheal deviation present. No thyromegaly present.  Cardiovascular: Normal rate, regular rhythm, normal heart sounds and intact  distal pulses.  Exam reveals no gallop and no friction rub.   No murmur heard. Pulmonary/Chest: Effort normal and breath sounds normal. No respiratory distress. She has no wheezes. She has no rales. She exhibits no tenderness.  Barrel chest with prolonged exhalation. No wheeze  Abdominal: Soft. Bowel sounds are normal. She exhibits no distension and no mass. There is no tenderness. There is no rebound and no guarding.  Musculoskeletal: Normal range of motion. She exhibits no edema and no tenderness.  Lymphadenopathy:    She has no cervical adenopathy.  Neurological: She is alert and oriented to person, place, and time. She has normal reflexes. No cranial nerve deficit. She exhibits normal muscle tone. Coordination normal.  Skin: Skin is warm and dry. No rash noted. She is not diaphoretic. No erythema. No pallor.  Psychiatric: She has a normal mood and affect. Her behavior is normal. Judgment and  thought content normal.       Assessment & Plan:

## 2013-06-18 ENCOUNTER — Telehealth: Payer: Self-pay | Admitting: Internal Medicine

## 2013-06-18 DIAGNOSIS — J849 Interstitial pulmonary disease, unspecified: Secondary | ICD-10-CM

## 2013-06-18 NOTE — Telephone Encounter (Signed)
Need her cbc with differential plese. Need to see if she is research eligible  Thanks  Dr. Kalman Shan, M.D., [facility].C.P Pulmonary and Critical Care Medicine Staff Physician [facility] Pager: [phone], If no answer or between  15:00h - 7:00h: call [phone]  06/18/2013 4:01 PM

## 2013-06-18 NOTE — Assessment & Plan Note (Signed)
#  Sinus draingae  - continue  fluticasone inhaler 2 squirts each nostril daily - continue 3% hypertonic nasal saline spray made by [employer] [facility] and do this 2 squirts each nostril at night  

## 2013-06-18 NOTE — Assessment & Plan Note (Signed)
#  COPD - STable disease - Continue regular treatment as before including oxygen and inhalers -  Maintenance Pulmonary rehabilitation to continue - Focus on losing weight because in the future lung transplantation might be an option - Continue  N. acetylcysteine 600 mg twice a day - glad you had flu shot; cMA will document it - we will screen you for ressearch studies in COPD  - CMA will get your complete blood count with differential from PCP Garlan Fillers, MD office      #Followup 3 months or sooner with COPD cat score

## 2013-06-19 NOTE — Telephone Encounter (Addendum)
Pt did not have a cbc with diff, only a cbc. Per MR please order CBC w/ diff and have pt come by to have done.  I ordered and pt is aware and will come by to get labs tomorrow. Carron Curie, CMA

## 2013-06-20 ENCOUNTER — Encounter (HOSPITAL_COMMUNITY)
Admission: RE | Admit: 2013-06-20 | Discharge: 2013-06-20 | Disposition: A | Discharge status: Home / Self Care | Payer: Self-pay | Source: Ambulatory Visit | Attending: Internal Medicine | Admitting: Internal Medicine

## 2013-06-20 ENCOUNTER — Other Ambulatory Visit (INDEPENDENT_AMBULATORY_CARE_PROVIDER_SITE_OTHER): Payer: BC Managed Care – PPO

## 2013-06-20 DIAGNOSIS — J8410 Pulmonary fibrosis, unspecified: Secondary | ICD-10-CM

## 2013-06-20 DIAGNOSIS — J849 Interstitial pulmonary disease, unspecified: Secondary | ICD-10-CM

## 2013-06-20 LAB — CBC WITH DIFFERENTIAL/PLATELET
Basophils Relative: 0.1 % (ref 0.0–3.0)
Eosinophils Absolute: 0.1 10*3/uL (ref 0.0–0.7)
HCT: 35 % — ABNORMAL LOW (ref 36.0–46.0)
Hemoglobin: 11.8 g/dL — ABNORMAL LOW (ref 12.0–15.0)
Lymphs Abs: 2.1 10*3/uL (ref 0.7–4.0)
MCHC: 33.7 g/dL (ref 30.0–36.0)
MCV: 85.5 fl (ref 78.0–100.0)
Monocytes Absolute: 1 10*3/uL (ref 0.1–1.0)
Neutro Abs: 7 10*3/uL (ref 1.4–7.7)
Neutrophils Relative %: 68.2 % (ref 43.0–77.0)
RBC: 4.1 Mil/uL (ref 3.87–5.11)
RDW: 13.2 % (ref 11.5–14.6)
WBC: 10.3 10*3/uL (ref 4.5–10.5)

## 2013-06-22 ENCOUNTER — Encounter (HOSPITAL_COMMUNITY)
Admission: RE | Admit: 2013-06-22 | Discharge: 2013-06-22 | Disposition: A | Discharge status: Home / Self Care | Payer: Self-pay | Source: Ambulatory Visit | Attending: Internal Medicine | Admitting: Internal Medicine

## 2013-06-22 NOTE — Progress Notes (Signed)
Katie Greene's heart rate continues to be more elevated than normal.  Normally her heart rate is 88-92 at rest, but today she is 109-112 at rest so when she exercises her heart rate goes up to 129-132.  I have had to stop her for rest breaks to have her heart rate decrease.  I left a message for Dr. Verl Dicker nurse regarding this information.  Patient is asymptomatic, and has actually lost 1.5 pounds since last visit, blood pressure is 112/68, oxygen saturations 95-99% on 2 liters of oxygen.

## 2013-06-27 ENCOUNTER — Encounter (HOSPITAL_COMMUNITY)
Admission: RE | Admit: 2013-06-27 | Discharge: 2013-06-27 | Disposition: A | Discharge status: Home / Self Care | Payer: Self-pay | Source: Ambulatory Visit | Attending: Internal Medicine | Admitting: Internal Medicine

## 2013-06-29 ENCOUNTER — Encounter (HOSPITAL_COMMUNITY): Payer: BC Managed Care – PPO

## 2013-07-04 ENCOUNTER — Encounter (HOSPITAL_COMMUNITY)
Admission: RE | Admit: 2013-07-04 | Discharge: 2013-07-04 | Disposition: A | Discharge status: Home / Self Care | Payer: Self-pay | Source: Ambulatory Visit | Attending: Internal Medicine | Admitting: Internal Medicine

## 2013-07-04 DIAGNOSIS — Z5189 Encounter for other specified aftercare: Secondary | ICD-10-CM | POA: Insufficient documentation

## 2013-07-04 DIAGNOSIS — J449 Chronic obstructive pulmonary disease, unspecified: Secondary | ICD-10-CM | POA: Insufficient documentation

## 2013-07-04 DIAGNOSIS — J4489 Other specified chronic obstructive pulmonary disease: Secondary | ICD-10-CM | POA: Insufficient documentation

## 2013-07-06 ENCOUNTER — Encounter (HOSPITAL_COMMUNITY)
Admission: RE | Admit: 2013-07-06 | Discharge: 2013-07-06 | Disposition: A | Discharge status: Home / Self Care | Payer: Self-pay | Source: Ambulatory Visit | Attending: Internal Medicine | Admitting: Internal Medicine

## 2013-07-06 NOTE — Progress Notes (Signed)
Patient's heart rate at rest today is 110-120.  Her target heart rate is 129, which is not allowing patient to get the full benefit of exercise in the program.  I spoke with April at Dr. Verl Dicker office regarding the above mentioned.  Blood pressure 110/60, oxygen saturation 93% on 2 liters of O2.  April to discuss with Dr. Jacinto Halim and call patient at home.

## 2013-07-11 ENCOUNTER — Encounter (HOSPITAL_COMMUNITY)
Admission: RE | Admit: 2013-07-11 | Discharge: 2013-07-11 | Disposition: A | Discharge status: Home / Self Care | Payer: Self-pay | Source: Ambulatory Visit | Attending: Internal Medicine | Admitting: Internal Medicine

## 2013-07-13 ENCOUNTER — Encounter (HOSPITAL_COMMUNITY)
Admission: RE | Admit: 2013-07-13 | Discharge: 2013-07-13 | Disposition: A | Discharge status: Home / Self Care | Payer: Self-pay | Source: Ambulatory Visit | Attending: Internal Medicine | Admitting: Internal Medicine

## 2013-07-18 ENCOUNTER — Encounter (HOSPITAL_COMMUNITY): Admission: RE | Admit: 2013-07-18 | Payer: Self-pay | Source: Ambulatory Visit

## 2013-07-18 ENCOUNTER — Telehealth: Payer: Self-pay | Admitting: Internal Medicine

## 2013-07-18 MED ORDER — LEVOFLOXACIN 500 MG PO TABS: 500.0000 mg | ORAL_TABLET | Freq: Every day | ORAL | Status: DC

## 2013-07-18 MED ORDER — METHYLPREDNISOLONE 4 MG PO KIT: PACK | ORAL | Status: DC

## 2013-07-18 NOTE — Telephone Encounter (Signed)
Per SN----  levaquin 500 mg  #7  1 daily Align once daily Medrol dosepak #1  Take as directed

## 2013-07-18 NOTE — Telephone Encounter (Signed)
Pt is aware that we will call in her rx's. Nothing further is needed.

## 2013-07-18 NOTE — Telephone Encounter (Signed)
Last visit 06-16-13. Dr. Marchelle Gearing COPD patient.  Pt is c/o having productive cough with yellow phlegm, nasal congestion and nose running, increased SOB, wheezing x 2 days. Pt states she went to [facility] this morning but they sent her home. Pt cannot cone in for OV due to transportation. Please advise. Carron Curie, CMA No Known Allergies

## 2013-07-20 ENCOUNTER — Encounter (HOSPITAL_COMMUNITY): Admission: RE | Admit: 2013-07-20 | Payer: BC Managed Care – PPO | Source: Ambulatory Visit

## 2013-07-25 ENCOUNTER — Encounter (HOSPITAL_COMMUNITY): Payer: BC Managed Care – PPO

## 2013-07-27 ENCOUNTER — Encounter (HOSPITAL_COMMUNITY): Payer: BC Managed Care – PPO

## 2013-08-01 ENCOUNTER — Encounter (HOSPITAL_COMMUNITY): Payer: BC Managed Care – PPO

## 2013-08-08 ENCOUNTER — Encounter (HOSPITAL_COMMUNITY)
Admission: RE | Admit: 2013-08-08 | Discharge: 2013-08-08 | Disposition: A | Discharge status: Home / Self Care | Payer: Self-pay | Source: Ambulatory Visit | Attending: Internal Medicine | Admitting: Internal Medicine

## 2013-08-08 DIAGNOSIS — J4489 Other specified chronic obstructive pulmonary disease: Secondary | ICD-10-CM | POA: Insufficient documentation

## 2013-08-08 DIAGNOSIS — Z5189 Encounter for other specified aftercare: Secondary | ICD-10-CM | POA: Insufficient documentation

## 2013-08-08 DIAGNOSIS — J449 Chronic obstructive pulmonary disease, unspecified: Secondary | ICD-10-CM | POA: Insufficient documentation

## 2013-08-15 ENCOUNTER — Encounter (HOSPITAL_COMMUNITY)
Admission: RE | Admit: 2013-08-15 | Discharge: 2013-08-15 | Disposition: A | Discharge status: Home / Self Care | Payer: Self-pay | Source: Ambulatory Visit | Attending: Internal Medicine | Admitting: Internal Medicine

## 2013-08-17 ENCOUNTER — Encounter (HOSPITAL_COMMUNITY)
Admission: RE | Admit: 2013-08-17 | Discharge: 2013-08-17 | Disposition: A | Discharge status: Home / Self Care | Payer: Self-pay | Source: Ambulatory Visit | Attending: Internal Medicine | Admitting: Internal Medicine

## 2013-08-22 ENCOUNTER — Encounter (HOSPITAL_COMMUNITY)
Admission: RE | Admit: 2013-08-22 | Discharge: 2013-08-22 | Disposition: A | Discharge status: Home / Self Care | Payer: Self-pay | Source: Ambulatory Visit | Attending: Internal Medicine | Admitting: Internal Medicine

## 2013-08-23 ENCOUNTER — Encounter: Payer: Self-pay | Admitting: Internal Medicine

## 2013-08-24 ENCOUNTER — Encounter (HOSPITAL_COMMUNITY)
Admission: RE | Admit: 2013-08-24 | Discharge: 2013-08-24 | Disposition: A | Discharge status: Home / Self Care | Payer: Self-pay | Source: Ambulatory Visit | Attending: Internal Medicine | Admitting: Internal Medicine

## 2013-08-24 MED ORDER — ALBUTEROL SULFATE HFA 108 (90 BASE) MCG/ACT IN AERS: 2.0000 | INHALATION_SPRAY | Freq: Four times a day (QID) | RESPIRATORY_TRACT | Status: DC | PRN

## 2013-08-29 ENCOUNTER — Encounter (HOSPITAL_COMMUNITY)
Admission: RE | Admit: 2013-08-29 | Discharge: 2013-08-29 | Disposition: A | Discharge status: Home / Self Care | Payer: Self-pay | Source: Ambulatory Visit | Attending: Internal Medicine | Admitting: Internal Medicine

## 2013-08-31 ENCOUNTER — Encounter (HOSPITAL_COMMUNITY)
Admission: RE | Admit: 2013-08-31 | Discharge: 2013-08-31 | Disposition: A | Discharge status: Home / Self Care | Payer: Self-pay | Source: Ambulatory Visit | Attending: Internal Medicine | Admitting: Internal Medicine

## 2013-09-05 ENCOUNTER — Encounter (HOSPITAL_COMMUNITY)
Admission: RE | Admit: 2013-09-05 | Discharge: 2013-09-05 | Disposition: A | Discharge status: Home / Self Care | Payer: Self-pay | Source: Ambulatory Visit | Attending: Internal Medicine | Admitting: Internal Medicine

## 2013-09-05 DIAGNOSIS — J449 Chronic obstructive pulmonary disease, unspecified: Secondary | ICD-10-CM | POA: Insufficient documentation

## 2013-09-05 DIAGNOSIS — Z5189 Encounter for other specified aftercare: Secondary | ICD-10-CM | POA: Insufficient documentation

## 2013-09-05 DIAGNOSIS — J4489 Other specified chronic obstructive pulmonary disease: Secondary | ICD-10-CM | POA: Insufficient documentation

## 2013-09-07 ENCOUNTER — Encounter (HOSPITAL_COMMUNITY)
Admission: RE | Admit: 2013-09-07 | Discharge: 2013-09-07 | Disposition: A | Discharge status: Home / Self Care | Payer: Self-pay | Source: Ambulatory Visit | Attending: Internal Medicine | Admitting: Internal Medicine

## 2013-09-12 ENCOUNTER — Encounter (HOSPITAL_COMMUNITY)
Admission: RE | Admit: 2013-09-12 | Discharge: 2013-09-12 | Disposition: A | Discharge status: Home / Self Care | Payer: Self-pay | Source: Ambulatory Visit | Attending: Internal Medicine | Admitting: Internal Medicine

## 2013-09-14 ENCOUNTER — Encounter (HOSPITAL_COMMUNITY)
Admission: RE | Admit: 2013-09-14 | Discharge: 2013-09-14 | Disposition: A | Discharge status: Home / Self Care | Payer: Self-pay | Source: Ambulatory Visit | Attending: Internal Medicine | Admitting: Internal Medicine

## 2013-09-19 ENCOUNTER — Encounter (HOSPITAL_COMMUNITY): Payer: Self-pay

## 2013-09-21 ENCOUNTER — Encounter (HOSPITAL_COMMUNITY): Payer: Self-pay

## 2013-09-26 ENCOUNTER — Encounter (HOSPITAL_COMMUNITY): Payer: Self-pay

## 2013-09-27 ENCOUNTER — Encounter: Payer: Self-pay | Admitting: Internal Medicine

## 2013-09-27 ENCOUNTER — Ambulatory Visit (INDEPENDENT_AMBULATORY_CARE_PROVIDER_SITE_OTHER): Payer: 59 | Admitting: Internal Medicine

## 2013-09-27 VITALS — BP 120/76 | HR 94 | Temp 97.9°F | Ht 67.0 in | Wt 195.2 lb

## 2013-09-27 DIAGNOSIS — J9610 Chronic respiratory failure, unspecified whether with hypoxia or hypercapnia: Secondary | ICD-10-CM

## 2013-09-27 DIAGNOSIS — Z129 Encounter for screening for malignant neoplasm, site unspecified: Secondary | ICD-10-CM

## 2013-09-27 DIAGNOSIS — R0982 Postnasal drip: Secondary | ICD-10-CM

## 2013-09-27 DIAGNOSIS — J449 Chronic obstructive pulmonary disease, unspecified: Secondary | ICD-10-CM

## 2013-09-27 DIAGNOSIS — R252 Cramp and spasm: Secondary | ICD-10-CM

## 2013-09-27 MED ORDER — TIOTROPIUM BROMIDE MONOHYDRATE 2.5 MCG/ACT IN AERS: 2.0000 | INHALATION_SPRAY | Freq: Every day | RESPIRATORY_TRACT | Status: DC

## 2013-09-27 NOTE — Progress Notes (Signed)
Subjective:    Patient ID: Katie Greene, female    DOB: [birth year], 58 y.o.   MRN: [id number]  HPI  #COPD - Gold stage 3. On home o2 since July 2012. MM genotype  PFT FVC fev1 ratio BD fev1 TLC DLCO comment  03/31/12 1.65L/55% 0.78L/32% 47/58% 11% 4.8L/88% 6.5/23%% Gold stage 3 copd with post bd fev1 0.86L/36%  - RX spiriva/symbicort - Applied disability Nov 2013 - Pulm rehab Early 2014  #AECOPD  - February 2014 outpatient treatment - April 2014 telephone treatment for acute bronchitis without prednisone - 07/28/2013: Telephone treatment with Levaquin and prednisone  #SMoking  - quit 2013  #Overweight  - Body mass index is 28.54 kg/(m^2).  -- Body mass index is 29.91 kg/(m^2).  On 06/16/2013 - Body mass index is 30.57 kg/(m^2).     #Lung cancer rrisk (age, smoking, strong family hx of several cancers) - CT 09/22/12 - emphysema with MPN of 50mms several - next ct to be done spring 2015   OV 06/16/2013  Followup COPD and chronic sinus drainage  - Overall doing well. COPD symptoms are stable and reflected by a stable COPD cat score of 24 reflected below. She has already had a flu shot. She is enjoying maintenance rehabilitation. She continues with oxygen and her medications without fail.  She is interested in participating in COPD research studies  Of note, she seen the cardiologist and been on Lasix therapy. This is giving some cramps and her medicines are being adjusted. Today she had some blood work   OV 09/27/2013 Followup COPD and chronic sinus drainage  Chief Complaint  Patient presents with  . Follow-up    Increased work to breath at times "due to weight". Pt states breathing is a little worse. Denies CP, cough and wheezing.     COPD: Overall this is stable. Dyspnea is slightly worse but this is associated with weight gain as documented above and the flow sheet. She continues with maintenance pulmonary rehabilitation and she benefits from this. No change in  baseline cough or sputum volume of sputum color. She continues with Spiriva but she feels that the technical quality of the MDI is not good. She is opening to triad Spiriva respimat. She continues on Symbicort. She is also on oxygen. She is interested in research trials of [employer] and [employer] COPD trials. I explained in detail what these trials entailed and emphasized this is not treatment but research. She's open to participation along with using a hand-held computer  device for this. She has not had Prevnar vaccine  Recurrent acute exacerbation COPD: Despite using N. acetylcysteine she did have a exacerbation around Christmas 2015  Sinus drainage: stable  Lung nodules: Annual CT due tin 3 months  Others: Low dose Lasix not helping with edema or dyspnea. High dose Lasix resulted in cramps. I have asked her to Dr. cardiologist and ask if potassium replacement would help  CAT COPD Symptom & Quality of Life Score ([place]) 0 is no burden. 5 is highest burden 06/10/2012  12/06/2012  03/13/2013  06/16/2013   Never Cough -> Cough all the time 3 3 2 2   No phlegm in chest -> Chest is full of phlegm 2 3 2 3   No chest tightness -> Chest feels very tight 2 2 3 2   No dyspnea for 1 flight stairs/hill -> Very dyspneic for 1 flight of stairs 5 5 5 5   No limitations for ADL at home -> Very limited with ADL at home  5 4 4 3   Confident leaving home -> Not at all confident leaving home 3 2 3 2   Sleep soundly -> Do not sleep soundly because of lung condition 4 3 4 4   Lots of Energy -> No energy at all 4 3 3 3   TOTAL Score (max 40)  28 25 26 24        Review of Systems  Constitutional: Negative for fever and unexpected weight change.  HENT: Negative for congestion, dental problem, ear pain, nosebleeds, postnasal drip, rhinorrhea, sinus pressure, sneezing, sore throat and trouble swallowing.   Eyes: Negative for redness and itching.  Respiratory: Negative for cough, chest tightness,  shortness of breath and wheezing.   Cardiovascular: Negative for palpitations and leg swelling.  Gastrointestinal: Negative for nausea and vomiting.  Genitourinary: Negative for dysuria.  Musculoskeletal: Negative for joint swelling.  Skin: Negative for rash.  Neurological: Negative for headaches.  Hematological: Does not bruise/bleed easily.  Psychiatric/Behavioral: Negative for dysphoric mood. The patient is not nervous/anxious.        Objective:   Physical Exam  Vitals reviewed. Constitutional: She is oriented to person, place, and time. She appears well-developed and well-nourished. No distress.  Body mass index is 30.57 kg/(m^2). Looks wel  HENT:  Head: Normocephalic and atraumatic.  Right Ear: External ear normal.  Left Ear: External ear normal.  Mouth/Throat: Oropharynx is clear and moist. No oropharyngeal exudate.  Eyes: Conjunctivae and EOM are normal. Pupils are equal, round, and reactive to light. Right eye exhibits no discharge. Left eye exhibits no discharge. No scleral icterus.  Neck: Normal range of motion. Neck supple. No JVD present. No tracheal deviation present. No thyromegaly present.  Cardiovascular: Normal rate, regular rhythm, normal heart sounds and intact distal pulses.  Exam reveals no gallop and no friction rub.   No murmur heard. Pulmonary/Chest: Effort normal and breath sounds normal. No respiratory distress. She has no wheezes. She has no rales. She exhibits no tenderness.  o2 on Improved purse lip and air entry  Abdominal: Soft. Bowel sounds are normal. She exhibits no distension and no mass. There is no tenderness. There is no rebound and no guarding.  Musculoskeletal: Normal range of motion. She exhibits no edema and no tenderness.  Lymphadenopathy:    She has no cervical adenopathy.  Neurological: She is alert and oriented to person, place, and time. She has normal reflexes. No cranial nerve deficit. She exhibits normal muscle tone. Coordination  normal.  Skin: Skin is warm and dry. No rash noted. She is not diaphoretic. No erythema. No pallor.  Psychiatric: She has a normal mood and affect. Her behavior is normal. Judgment and thought content normal.          Assessment & Plan:

## 2013-09-27 NOTE — Patient Instructions (Addendum)
#  COPD - STable disease - Continue spiriva but CHANGE to RESPIMAT; learn technique -Continue Symnbicort - Continue Oxygen  -  Maintenance Pulmonary rehabilitation to continue - Focus on losing weight because in the future lung transplantation might be an option - Continue  N. acetylcysteine 600 mg twice a day to prevent COPD flare ups - Have PREVNAR new strong pneumonia vaccine 09/27/2013  #Lung nodule  - repeat annual CT chest for 54mm lung nodules in 3 months   #Sinus draingae  - continue  fluticasone inhaler 2 squirts each nostril daily - continue 3% hypertonic nasal saline spray made by [employer] and do this 2 squirts each nostril at night  #CRamps  - call Dr Einar Gip and maybe this is related to low potassium and ask if you should be on potassium tablets  #Followup 3 months or sooner with COPD cat score CT chest at time of next visit in 3 months

## 2013-09-28 ENCOUNTER — Encounter (HOSPITAL_COMMUNITY): Payer: Self-pay

## 2013-09-28 DIAGNOSIS — J9610 Chronic respiratory failure, unspecified whether with hypoxia or hypercapnia: Secondary | ICD-10-CM | POA: Insufficient documentation

## 2013-09-28 DIAGNOSIS — R252 Cramp and spasm: Secondary | ICD-10-CM | POA: Insufficient documentation

## 2013-09-28 NOTE — Assessment & Plan Note (Signed)
#  Sinus draingae  - continue  fluticasone inhaler 2 squirts each nostril daily - continue 3% hypertonic nasal saline spray made by [employer] and do this 2 squirts each nostril at night

## 2013-09-28 NOTE — Assessment & Plan Note (Signed)
3 mm lung nodules feb 2014.   Plan Annual repeat ct chest in 3 months from feb 2015

## 2013-09-28 NOTE — Assessment & Plan Note (Signed)
#  COPD - STable disease - Continue spiriva but CHANGE to RESPIMAT; learn technique -Continue Symnbicort - Continue Oxygen  -  Maintenance Pulmonary rehabilitation to continue - Focus on losing weight because in the future lung transplantation might be an option - Continue  N. acetylcysteine 600 mg twice a day to prevent COPD flare ups - Have PREVNAR new strong pneumonia vaccine 09/27/2013  #Lung nodule  - repeat annual CT chest for 9mm lung nodules in 3 months  #Followup 3 months or sooner with COPD cat score CT chest at time of next visit in 3 months

## 2013-09-28 NOTE — Assessment & Plan Note (Signed)
Probably related to low K of lasix  Plan  #CRamps  - call Dr Einar Gip and maybe this is related to low potassium and ask if you should be on potassium tablets

## 2013-10-03 ENCOUNTER — Encounter (HOSPITAL_COMMUNITY)
Admission: RE | Admit: 2013-10-03 | Discharge: 2013-10-03 | Disposition: A | Discharge status: Home / Self Care | Payer: Self-pay | Source: Ambulatory Visit | Attending: Internal Medicine | Admitting: Internal Medicine

## 2013-10-03 DIAGNOSIS — J4489 Other specified chronic obstructive pulmonary disease: Secondary | ICD-10-CM | POA: Insufficient documentation

## 2013-10-03 DIAGNOSIS — J449 Chronic obstructive pulmonary disease, unspecified: Secondary | ICD-10-CM | POA: Insufficient documentation

## 2013-10-03 DIAGNOSIS — Z5189 Encounter for other specified aftercare: Secondary | ICD-10-CM | POA: Insufficient documentation

## 2013-10-05 ENCOUNTER — Encounter (HOSPITAL_COMMUNITY)
Admission: RE | Admit: 2013-10-05 | Discharge: 2013-10-05 | Disposition: A | Discharge status: Home / Self Care | Payer: Self-pay | Source: Ambulatory Visit | Attending: Internal Medicine | Admitting: Internal Medicine

## 2013-10-10 ENCOUNTER — Encounter (HOSPITAL_COMMUNITY)
Admission: RE | Admit: 2013-10-10 | Discharge: 2013-10-10 | Disposition: A | Discharge status: Home / Self Care | Payer: Self-pay | Source: Ambulatory Visit | Attending: Internal Medicine | Admitting: Internal Medicine

## 2013-10-12 ENCOUNTER — Encounter (HOSPITAL_COMMUNITY): Payer: Self-pay

## 2013-10-17 ENCOUNTER — Encounter (HOSPITAL_COMMUNITY)
Admission: RE | Admit: 2013-10-17 | Discharge: 2013-10-17 | Disposition: A | Discharge status: Home / Self Care | Payer: Self-pay | Source: Ambulatory Visit | Attending: Internal Medicine | Admitting: Internal Medicine

## 2013-10-19 ENCOUNTER — Encounter (HOSPITAL_COMMUNITY)
Admission: RE | Admit: 2013-10-19 | Discharge: 2013-10-19 | Disposition: A | Discharge status: Home / Self Care | Payer: Self-pay | Source: Ambulatory Visit | Attending: Internal Medicine | Admitting: Internal Medicine

## 2013-10-20 ENCOUNTER — Telehealth: Payer: Self-pay | Admitting: Internal Medicine

## 2013-10-20 NOTE — Telephone Encounter (Signed)
Called [facility] and had to [facility] x1 to see what is going on with pt spiriva resp.

## 2013-10-23 MED ORDER — ACLIDINIUM BROMIDE 400 MCG/ACT IN AEPB: 1.0000 | INHALATION_SPRAY | Freq: Two times a day (BID) | RESPIRATORY_TRACT | Status: DC

## 2013-10-23 NOTE — Telephone Encounter (Signed)
Try Caprice Renshaw

## 2013-10-23 NOTE — Telephone Encounter (Signed)
Pt aware of recs and rx sent. Nothing further needed 

## 2013-10-23 NOTE — Telephone Encounter (Signed)
We attempted a PA for spiriva Respimat but we received a denial because the medication is a plan exclusion and cannot be covered. The covered alternatives are spiriva handihaler and tudorza. Please advise. [place] Bing, CMA

## 2013-10-24 ENCOUNTER — Encounter (HOSPITAL_COMMUNITY)
Admission: RE | Admit: 2013-10-24 | Discharge: 2013-10-24 | Disposition: A | Discharge status: Home / Self Care | Payer: Self-pay | Source: Ambulatory Visit | Attending: Internal Medicine | Admitting: Internal Medicine

## 2013-10-26 ENCOUNTER — Encounter (HOSPITAL_COMMUNITY)
Admission: RE | Admit: 2013-10-26 | Discharge: 2013-10-26 | Disposition: A | Discharge status: Home / Self Care | Payer: Self-pay | Source: Ambulatory Visit | Attending: Internal Medicine | Admitting: Internal Medicine

## 2013-10-31 ENCOUNTER — Encounter (HOSPITAL_COMMUNITY)
Admission: RE | Admit: 2013-10-31 | Discharge: 2013-10-31 | Disposition: A | Discharge status: Home / Self Care | Payer: Self-pay | Source: Ambulatory Visit | Attending: Internal Medicine | Admitting: Internal Medicine

## 2013-11-02 ENCOUNTER — Encounter (HOSPITAL_COMMUNITY)
Admission: RE | Admit: 2013-11-02 | Discharge: 2013-11-02 | Disposition: A | Discharge status: Home / Self Care | Payer: Self-pay | Source: Ambulatory Visit | Attending: Internal Medicine | Admitting: Internal Medicine

## 2013-11-02 DIAGNOSIS — J449 Chronic obstructive pulmonary disease, unspecified: Secondary | ICD-10-CM | POA: Insufficient documentation

## 2013-11-02 DIAGNOSIS — Z5189 Encounter for other specified aftercare: Secondary | ICD-10-CM | POA: Insufficient documentation

## 2013-11-02 DIAGNOSIS — J4489 Other specified chronic obstructive pulmonary disease: Secondary | ICD-10-CM | POA: Insufficient documentation

## 2013-11-07 ENCOUNTER — Encounter (HOSPITAL_COMMUNITY)
Admission: RE | Admit: 2013-11-07 | Discharge: 2013-11-07 | Disposition: A | Discharge status: Home / Self Care | Payer: Self-pay | Source: Ambulatory Visit | Attending: Internal Medicine | Admitting: Internal Medicine

## 2013-11-09 ENCOUNTER — Encounter (HOSPITAL_COMMUNITY): Payer: Self-pay

## 2013-11-14 ENCOUNTER — Encounter (HOSPITAL_COMMUNITY)
Admission: RE | Admit: 2013-11-14 | Discharge: 2013-11-14 | Disposition: A | Discharge status: Home / Self Care | Payer: Self-pay | Source: Ambulatory Visit | Attending: Internal Medicine | Admitting: Internal Medicine

## 2013-11-16 ENCOUNTER — Encounter (HOSPITAL_COMMUNITY)
Admission: RE | Admit: 2013-11-16 | Discharge: 2013-11-16 | Disposition: A | Discharge status: Home / Self Care | Payer: Self-pay | Source: Ambulatory Visit | Attending: Internal Medicine | Admitting: Internal Medicine

## 2013-11-16 ENCOUNTER — Other Ambulatory Visit: Payer: Self-pay | Admitting: *Deleted

## 2013-11-16 MED ORDER — FLUTICASONE PROPIONATE 50 MCG/ACT NA SUSP: 2.0000 | Freq: Every day | NASAL | Status: DC

## 2013-11-21 ENCOUNTER — Encounter (HOSPITAL_COMMUNITY): Payer: Self-pay

## 2013-11-22 ENCOUNTER — Other Ambulatory Visit: Payer: Self-pay | Admitting: Internal Medicine

## 2013-11-22 ENCOUNTER — Ambulatory Visit: Payer: Self-pay | Admitting: Critical Care Medicine

## 2013-11-22 ENCOUNTER — Encounter: Payer: Self-pay | Admitting: Critical Care Medicine

## 2013-11-22 ENCOUNTER — Ambulatory Visit (INDEPENDENT_AMBULATORY_CARE_PROVIDER_SITE_OTHER)
Admission: RE | Admit: 2013-11-22 | Discharge: 2013-11-22 | Disposition: A | Discharge status: Home / Self Care | Payer: 59 | Source: Ambulatory Visit | Attending: Critical Care Medicine | Admitting: Critical Care Medicine

## 2013-11-22 ENCOUNTER — Ambulatory Visit: Payer: Self-pay | Admitting: Internal Medicine

## 2013-11-22 VITALS — BP 110/78 | HR 89 | Temp 97.3°F | Ht 67.0 in | Wt 199.2 lb

## 2013-11-22 DIAGNOSIS — Z006 Encounter for examination for normal comparison and control in clinical research program: Secondary | ICD-10-CM

## 2013-11-22 DIAGNOSIS — J441 Chronic obstructive pulmonary disease with (acute) exacerbation: Secondary | ICD-10-CM

## 2013-11-22 DIAGNOSIS — J449 Chronic obstructive pulmonary disease, unspecified: Secondary | ICD-10-CM

## 2013-11-22 LAB — PULMONARY FUNCTION TEST
FEF 25-75 Post: 0.42 L/sec
FEF 25-75 Pre: 0.22 L/sec
FEF2575-%Change-Post: 89 %
FEF2575-%PRED-PRE: 9 %
FEF2575-%Pred-Post: 18 %
FEV1-%Change-Post: 26 %
FEV1-%Pred-Post: 36 %
FEV1-%Pred-Pre: 28 %
FEV1-Post: 0.86 L
FEV1-Pre: 0.68 L
FEV1FVC-%Change-Post: 7 %
FEV1FVC-%Pred-Pre: 56 %
FEV6-%Change-Post: 21 %
FEV6-%PRED-PRE: 48 %
FEV6-%Pred-Post: 59 %
FEV6-PRE: 1.42 L
FEV6-Post: 1.72 L
FEV6FVC-%CHANGE-POST: 3 %
FEV6FVC-%PRED-POST: 98 %
FEV6FVC-%Pred-Pre: 95 %
FVC-%CHANGE-POST: 17 %
FVC-%Pred-Post: 59 %
FVC-%Pred-Pre: 50 %
FVC-POST: 1.8 L
FVC-PRE: 1.53 L
POST FEV6/FVC RATIO: 96 %
Post FEV1/FVC ratio: 48 %
Pre FEV1/FVC ratio: 44 %
Pre FEV6/FVC Ratio: 93 %

## 2013-11-22 NOTE — Progress Notes (Addendum)
Subjective:    Patient ID: Katie Greene, female    DOB: [birth year], 58 y.o.   MRN: [id number]  HPI   #COPD - Gold stage 3. On home o2 since July 2012. MM genotype  PFT FVC fev1 ratio BD fev1 TLC DLCO comment  03/31/12 1.65L/55% 0.78L/32% 47/58% 11% 4.8L/88% 6.5/23%% Gold stage 3 copd with post bd fev1 0.86L/36%  - RX spiriva/symbicort - Applied disability Nov 2013 - Pulm rehab Early 2014  #AECOPD  - February 2014 outpatient treatment - April 2014 telephone treatment for acute bronchitis without prednisone - 07/28/2013: Telephone treatment with Levaquin and prednisone  #SMoking  - quit 2013  #Overweight  - Body mass index is 28.54 kg/(m^2).  -- Body mass index is 29.91 kg/(m^2).  On 06/16/2013 - Body mass index is 31.19 kg/(m^2).     #Lung cancer rrisk (age, smoking, strong family hx of several cancers) - CT 09/22/12 - emphysema with MPN of 51mms several - next ct to be done spring 2015   OV 06/16/2013  Followup COPD and chronic sinus drainage  - Overall doing well. COPD symptoms are stable and reflected by a stable COPD cat score of 24 reflected below. She has already had a flu shot. She is enjoying maintenance rehabilitation. She continues with oxygen and her medications without fail.  She is interested in participating in COPD research studies  Of note, she seen the cardiologist and been on Lasix therapy. This is giving some cramps and her medicines are being adjusted. Today she had some blood work   OV 11/22/2013 Followup COPD and chronic sinus drainage  Chief Complaint  Patient presents with  . Research visit    COPD: Overall this is stable. Dyspnea is slightly worse but this is associated with weight gain as documented above and the flow sheet. She continues with maintenance pulmonary rehabilitation and she benefits from this. No change in baseline cough or sputum volume of sputum color. She continues with Spiriva but she feels that the technical quality of  the MDI is not good. She is opening to triad Spiriva respimat. She continues on Symbicort. She is also on oxygen. She is interested in research trials of [employer] and [employer] COPD trials. I explained in detail what these trials entailed and emphasized this is not treatment but research. She's open to participation along with using a hand-held computer  device for this. She has not had Prevnar vaccine  Recurrent acute exacerbation COPD: Despite using N. acetylcysteine she did have a exacerbation around Christmas 2014  Sinus drainage: stable  Lung nodules: Annual CT due tin 3 months  Others: Low dose Lasix not helping with edema or dyspnea. High dose Lasix resulted in cramps. I have asked her to Dr. cardiologist and ask if potassium replacement would help  CAT COPD Symptom & Quality of Life Score ([place]) 0 is no burden. 5 is highest burden 06/10/2012  12/06/2012  03/13/2013  06/16/2013   Never Cough -> Cough all the time 3 3 2 2   No phlegm in chest -> Chest is full of phlegm 2 3 2 3   No chest tightness -> Chest feels very tight 2 2 3 2   No dyspnea for 1 flight stairs/hill -> Very dyspneic for 1 flight of stairs 5 5 5 5   No limitations for ADL at home -> Very limited with ADL at home 5 4 4 3   Confident leaving home -> Not at all confident leaving home 3 2 3 2   Sleep soundly ->  Do not sleep soundly because of lung condition 4 3 4 4   Lots of Energy -> No energy at all 4 3 3 3   TOTAL Score (max 40)  28 25 26 24    11/22/2013 Chief Complaint  Patient presents with  . Research visit   Research visit. Dx copd   Randomizing  No overall changes from prior OV      Review of Systems  Constitutional: Negative for fever and unexpected weight change.  HENT: Negative for congestion, dental problem, ear pain, nosebleeds, postnasal drip, rhinorrhea, sinus pressure, sneezing, sore throat and trouble swallowing.   Eyes: Negative for redness and itching.  Respiratory: Negative for  cough, chest tightness, shortness of breath and wheezing.   Cardiovascular: Negative for palpitations and leg swelling.  Gastrointestinal: Negative for nausea and vomiting.  Genitourinary: Negative for dysuria.  Musculoskeletal: Negative for joint swelling.  Skin: Negative for rash.  Neurological: Negative for headaches.  Hematological: Does not bruise/bleed easily.  Psychiatric/Behavioral: Negative for dysphoric mood. The patient is not nervous/anxious.        Objective:   Physical Exam  Vitals reviewed. Constitutional: She is oriented to person, place, and time. She appears well-developed and well-nourished. No distress.  Body mass index is 30.57 kg/(m^2). Looks wel  HENT:  Head: Normocephalic and atraumatic.  Right Ear: External ear normal.  Left Ear: External ear normal.  Mouth/Throat: Oropharynx is clear and moist. No oropharyngeal exudate.  Eyes: Conjunctivae and EOM are normal. Pupils are equal, round, and reactive to light. Right eye exhibits no discharge. Left eye exhibits no discharge. No scleral icterus.  Neck: Normal range of motion. Neck supple. No JVD present. No tracheal deviation present. No thyromegaly present.  Cardiovascular: Normal rate, regular rhythm, normal heart sounds and intact distal pulses.  Exam reveals no gallop and no friction rub.   No murmur heard. Pulmonary/Chest: Effort normal and breath sounds normal. No respiratory distress. She has no wheezes. She has no rales. She exhibits no tenderness.  o2 on Improved purse lip and air entry  Abdominal: Soft. Bowel sounds are normal. She exhibits no distension and no mass. There is no tenderness. There is no rebound and no guarding.  Musculoskeletal: Normal range of motion. She exhibits no edema and no tenderness.  Lymphadenopathy:    She has no cervical adenopathy.  Neurological: She is alert and oriented to person, place, and time. She has normal reflexes. No cranial nerve deficit. She exhibits normal  muscle tone. Coordination normal.  Skin: Skin is warm and dry. No rash noted. She is not diaphoretic. No erythema. No pallor.  Psychiatric: She has a normal mood and affect. Her behavior is normal. Judgment and thought content normal.      Assessment & Plan:   COPD (chronic obstructive pulmonary disease) Copd Gold C  Plan Pt evaluated for research protocol CRF filled out    Updated Medication List Outpatient Encounter Prescriptions as of 11/22/2013  Medication Sig  . Acetylcysteine 600 MG CAPS Take 1 capsule by mouth 2 (two) times daily.  . Aclidinium Bromide (TUDORZA PRESSAIR) 400 MCG/ACT AEPB Inhale 1 puff into the lungs 2 (two) times daily.  Marland Kitchen albuterol (VENTOLIN HFA) 108 (90 BASE) MCG/ACT inhaler Inhale 2 puffs into the lungs every 6 (six) hours as needed.  Marland Kitchen aspirin 81 MG tablet Take 81 mg by mouth daily.    . budesonide-formoterol (SYMBICORT) 80-4.5 MCG/ACT inhaler Inhale 2 puffs into the lungs 2 (two) times daily.  Marland Kitchen diltiazem (CARDIZEM CD) 180 MG 24  hr capsule Take 180 mg by mouth daily.  . fluticasone (FLONASE) 50 MCG/ACT nasal spray Place 2 sprays into both nostrils daily.  Marland Kitchen losartan-hydrochlorothiazide (HYZAAR) 100-25 MG per tablet Take 1 tablet by mouth daily.  . meloxicam (MOBIC) 7.5 MG tablet 1 tablet Daily.  . pravastatin (PRAVACHOL) 40 MG tablet Take 40 mg by mouth daily.  Marland Kitchen spironolactone (ALDACTONE) 50 MG tablet Take 50 mg by mouth daily.  . temazepam (RESTORIL) 15 MG capsule 1 tablet Daily.  . TOPROL XL 50 MG 24 hr tablet 1 tablet Daily.  . [DISCONTINUED] Tiotropium Bromide Monohydrate (SPIRIVA RESPIMAT) 2.5 MCG/ACT AERS Inhale 2 puffs into the lungs daily.

## 2013-11-22 NOTE — Progress Notes (Signed)
Spirometry before and after done today. 

## 2013-11-22 NOTE — Progress Notes (Signed)
Protocol Title: A phase III, 52 week, randomized, double blind, 3-arm, parallel group study, comparing the efficacy, safety and tolerability of the fixed dose triple combination FF/UMEC/VI with the fixed dose dual combinations of FF/VI and UMEC/VI, all administered once daily in the morning via a dry powder inhaler in subjects with chronic obstructive pulmonary disease.  This patient, Katie Greene, has consented to the above clinical trial according to FDA regulations, GCP guidelines and [place] SOPs. The study design has been explained to the patient and the patient demonstrated comprehension. No study procedures have been initiated before consenting of this patient. This patient was given sufficient time for reading the consent form and asking questions. All risks, benefits and options have been thoroughly discussed. This patient was not coerced in any way to participate or to continue participation in this clinical trial. The patient has signed the local version 2 of the Informed Consent Form voluntarily at 7:55am on November 22, 2013. This patient was given a copy of this consent.    Doreatha Martin, RN   I agree with this note  Elsie Stain

## 2013-11-23 ENCOUNTER — Encounter (HOSPITAL_COMMUNITY)
Admission: RE | Admit: 2013-11-23 | Discharge: 2013-11-23 | Disposition: A | Discharge status: Home / Self Care | Payer: Self-pay | Source: Ambulatory Visit | Attending: Internal Medicine | Admitting: Internal Medicine

## 2013-11-23 ENCOUNTER — Telehealth: Payer: Self-pay | Admitting: Internal Medicine

## 2013-11-23 MED ORDER — BUDESONIDE-FORMOTEROL FUMARATE 80-4.5 MCG/ACT IN AERO: 2.0000 | INHALATION_SPRAY | Freq: Two times a day (BID) | RESPIRATORY_TRACT | Status: DC

## 2013-11-23 NOTE — Telephone Encounter (Signed)
Refill of the symbicort has been sent to the pharmacy per pharmacy request.

## 2013-11-23 NOTE — Assessment & Plan Note (Signed)
Copd Gold C  Plan Pt evaluated for research protocol CRF filled out

## 2013-11-28 ENCOUNTER — Encounter (HOSPITAL_COMMUNITY)
Admission: RE | Admit: 2013-11-28 | Discharge: 2013-11-28 | Disposition: A | Discharge status: Home / Self Care | Payer: Self-pay | Source: Ambulatory Visit | Attending: Internal Medicine | Admitting: Internal Medicine

## 2013-11-30 ENCOUNTER — Telehealth: Payer: Self-pay | Admitting: Internal Medicine

## 2013-11-30 ENCOUNTER — Encounter (HOSPITAL_COMMUNITY)
Admission: RE | Admit: 2013-11-30 | Discharge: 2013-11-30 | Disposition: A | Discharge status: Home / Self Care | Payer: Self-pay | Source: Ambulatory Visit | Attending: Internal Medicine | Admitting: Internal Medicine

## 2013-11-30 NOTE — Telephone Encounter (Signed)
Received fax from [place] for Symbicort PA. Forms completed and faxed back to [facility]. Will forward to [facility] to follow up on.

## 2013-12-04 NOTE — Telephone Encounter (Signed)
Received denial for symbicort stating the pt must try and fail both advair, and breo. Please advise. [place] Bing, CMA

## 2013-12-05 ENCOUNTER — Encounter (HOSPITAL_COMMUNITY)
Admission: RE | Admit: 2013-12-05 | Discharge: 2013-12-05 | Disposition: A | Discharge status: Home / Self Care | Payer: Self-pay | Source: Ambulatory Visit | Attending: Internal Medicine | Admitting: Internal Medicine

## 2013-12-05 DIAGNOSIS — Z5189 Encounter for other specified aftercare: Secondary | ICD-10-CM | POA: Insufficient documentation

## 2013-12-05 DIAGNOSIS — J4489 Other specified chronic obstructive pulmonary disease: Secondary | ICD-10-CM | POA: Insufficient documentation

## 2013-12-05 DIAGNOSIS — J449 Chronic obstructive pulmonary disease, unspecified: Secondary | ICD-10-CM | POA: Insufficient documentation

## 2013-12-06 ENCOUNTER — Other Ambulatory Visit: Payer: Self-pay | Admitting: Internal Medicine

## 2013-12-06 DIAGNOSIS — R0602 Shortness of breath: Secondary | ICD-10-CM

## 2013-12-06 NOTE — Telephone Encounter (Signed)
Do breo 1 puff daily

## 2013-12-07 ENCOUNTER — Encounter (HOSPITAL_COMMUNITY)
Admission: RE | Admit: 2013-12-07 | Discharge: 2013-12-07 | Disposition: A | Discharge status: Home / Self Care | Payer: Self-pay | Source: Ambulatory Visit | Attending: Internal Medicine | Admitting: Internal Medicine

## 2013-12-08 ENCOUNTER — Ambulatory Visit: Payer: Self-pay | Admitting: Internal Medicine

## 2013-12-08 ENCOUNTER — Ambulatory Visit: Payer: Self-pay | Admitting: Critical Care Medicine

## 2013-12-08 ENCOUNTER — Encounter: Payer: Self-pay | Admitting: Critical Care Medicine

## 2013-12-08 VITALS — BP 108/74 | HR 86 | Temp 97.9°F | Ht 67.5 in | Wt 198.4 lb

## 2013-12-08 DIAGNOSIS — J449 Chronic obstructive pulmonary disease, unspecified: Secondary | ICD-10-CM

## 2013-12-08 DIAGNOSIS — R0602 Shortness of breath: Secondary | ICD-10-CM

## 2013-12-08 LAB — PULMONARY FUNCTION TEST
FEF 25-75 Post: 0.39 L/sec
FEF 25-75 Pre: 0.26 L/sec
FEF2575-%Change-Post: 50 %
FEF2575-%Pred-Post: 16 %
FEF2575-%Pred-Pre: 11 %
FEV1-%Change-Post: 13 %
FEV1-%Pred-Post: 33 %
FEV1-%Pred-Pre: 29 %
FEV1-Post: 0.81 L
FEV1-Pre: 0.71 L
FEV1FVC-%Change-Post: -4 %
FEV1FVC-%Pred-Pre: 59 %
FEV6-%CHANGE-POST: 16 %
FEV6-%PRED-POST: 57 %
FEV6-%PRED-PRE: 49 %
FEV6-PRE: 1.46 L
FEV6-Post: 1.7 L
FEV6FVC-%Change-Post: -1 %
FEV6FVC-%PRED-POST: 96 %
FEV6FVC-%Pred-Pre: 98 %
FVC-%Change-Post: 18 %
FVC-%PRED-POST: 59 %
FVC-%PRED-PRE: 50 %
FVC-PRE: 1.53 L
FVC-Post: 1.82 L
POST FEV6/FVC RATIO: 94 %
Post FEV1/FVC ratio: 45 %
Pre FEV1/FVC ratio: 47 %
Pre FEV6/FVC Ratio: 96 %

## 2013-12-08 NOTE — Progress Notes (Addendum)
Subjective:    Patient ID: Katie Greene, female    DOB: [birth year], 58 y.o.   MRN: [id number]  HPI   #COPD - Gold stage 3. On home o2 since July 2012. MM genotype  PFT FVC fev1 ratio BD fev1 TLC DLCO comment  03/31/12 1.65L/55% 0.78L/32% 47/58% 11% 4.8L/88% 6.5/23%% Gold stage 3 copd with post bd fev1 0.86L/36%  - RX spiriva/symbicort - Applied disability Nov 2013 - Pulm rehab Early 2014  #AECOPD  - February 2014 outpatient treatment - April 2014 telephone treatment for acute bronchitis without prednisone - 07/28/2013: Telephone treatment with Levaquin and prednisone  #SMoking  - quit 2013  #Overweight  - Body mass index is 28.54 kg/(m^2).  -- Body mass index is 29.91 kg/(m^2).  On 06/16/2013 - Body mass index is 30.6 kg/(m^2).     #Lung cancer rrisk (age, smoking, strong family hx of several cancers) - CT 09/22/12 - emphysema with MPN of 73mms several - next ct to be done spring 2015   OV 06/16/2013  Followup COPD and chronic sinus drainage  - Overall doing well. COPD symptoms are stable and reflected by a stable COPD cat score of 24 reflected below. She has already had a flu shot. She is enjoying maintenance rehabilitation. She continues with oxygen and her medications without fail.  She is interested in participating in COPD research studies  Of note, she seen the cardiologist and been on Lasix therapy. This is giving some cramps and her medicines are being adjusted. Today she had some blood work    Followup COPD and chronic sinus drainage   Impact Study    COPD: Overall this is stable. Dyspnea is slightly worse but this is associated with weight gain as documented above and the flow sheet. She continues with maintenance pulmonary rehabilitation and she benefits from this. No change in baseline cough or sputum volume of sputum color. She continues with Spiriva but she feels that the technical quality of the MDI is not good. She is opening to triad Spiriva  respimat. She continues on Symbicort. She is also on oxygen. She is interested in research trials of [employer] and [employer] COPD trials. I explained in detail what these trials entailed and emphasized this is not treatment but research. She's open to participation along with using a hand-held computer  device for this. She has not had Prevnar vaccine  Recurrent acute exacerbation COPD: Despite using N. acetylcysteine she did have a exacerbation around Christmas 2014  Sinus drainage: stable  Lung nodules: Annual CT due tin 3 months  Others: Low dose Lasix not helping with edema or dyspnea. High dose Lasix resulted in cramps. I have asked her to Dr. cardiologist and ask if potassium replacement would help  CAT COPD Symptom & Quality of Life Score ([place]) 0 is no burden. 5 is highest burden 06/10/2012  12/06/2012  03/13/2013  06/16/2013   Never Cough -> Cough all the time 3 3 2 2   No phlegm in chest -> Chest is full of phlegm 2 3 2 3   No chest tightness -> Chest feels very tight 2 2 3 2   No dyspnea for 1 flight stairs/hill -> Very dyspneic for 1 flight of stairs 5 5 5 5   No limitations for ADL at home -> Very limited with ADL at home 5 4 4 3   Confident leaving home -> Not at all confident leaving home 3 2 3 2   Sleep soundly -> Do not sleep soundly because of lung condition  4 3 4 4   Lots of Energy -> No energy at all 4 3 3 3   TOTAL Score (max 40)  28 25 26 24    11/22/2013 Chief Complaint  Patient presents with  . Research visit   Research visit. Dx copd   Randomizing  No overall changes from prior OV   12/08/2013 Chief Complaint  Patient presents with  . Research Visit    Impact Study  No changes from 11/22/13.  Here to get randomized.   No acute changes on CXR.  Labs ok.     Review of Systems  Constitutional: Negative for fever and unexpected weight change.  HENT: Negative for congestion, dental problem, ear pain, nosebleeds, postnasal drip, rhinorrhea, sinus  pressure, sneezing, sore throat and trouble swallowing.   Eyes: Negative for redness and itching.  Respiratory: Negative for cough, chest tightness, shortness of breath and wheezing.   Cardiovascular: Negative for palpitations and leg swelling.  Gastrointestinal: Negative for nausea and vomiting.  Genitourinary: Negative for dysuria.  Musculoskeletal: Negative for joint swelling.  Skin: Negative for rash.  Neurological: Negative for headaches.  Hematological: Does not bruise/bleed easily.  Psychiatric/Behavioral: Negative for dysphoric mood. The patient is not nervous/anxious.        Objective:   Physical Exam  Vitals reviewed. Constitutional: She is oriented to person, place, and time. She appears well-developed and well-nourished. No distress.  Body mass index is 30.57 kg/(m^2). Looks wel  HENT:  Head: Normocephalic and atraumatic.  Right Ear: External ear normal.  Left Ear: External ear normal.  Mouth/Throat: Oropharynx is clear and moist. No oropharyngeal exudate.  Eyes: Conjunctivae and EOM are normal. Pupils are equal, round, and reactive to light. Right eye exhibits no discharge. Left eye exhibits no discharge. No scleral icterus.  Neck: Normal range of motion. Neck supple. No JVD present. No tracheal deviation present. No thyromegaly present.  Cardiovascular: Normal rate, regular rhythm, normal heart sounds and intact distal pulses.  Exam reveals no gallop and no friction rub.   No murmur heard. Pulmonary/Chest: Effort normal and breath sounds normal. No respiratory distress. She has no wheezes. She has no rales. She exhibits no tenderness.  o2 on Improved purse lip and air entry  Abdominal: Soft. Bowel sounds are normal. She exhibits no distension and no mass. There is no tenderness. There is no rebound and no guarding.  Musculoskeletal: Normal range of motion. She exhibits no edema and no tenderness.  Lymphadenopathy:    She has no cervical adenopathy.  Neurological: She  is alert and oriented to person, place, and time. She has normal reflexes. No cranial nerve deficit. She exhibits normal muscle tone. Coordination normal.  Skin: Skin is warm and dry. No rash noted. She is not diaphoretic. No erythema. No pallor.  Psychiatric: She has a normal mood and affect. Her behavior is normal. Judgment and thought content normal.      Assessment & Plan:   COPD (chronic obstructive pulmonary disease) Pt qualifies for IMPACT trial and enrolled.     Updated Medication List Outpatient Encounter Prescriptions as of 12/08/2013  Medication Sig  . Acetylcysteine 600 MG CAPS Take 1 capsule by mouth 2 (two) times daily.  . Aclidinium Bromide (TUDORZA PRESSAIR) 400 MCG/ACT AEPB Inhale 1 puff into the lungs 2 (two) times daily.  Marland Kitchen albuterol (VENTOLIN HFA) 108 (90 BASE) MCG/ACT inhaler Inhale 2 puffs into the lungs every 6 (six) hours as needed.  Marland Kitchen aspirin 81 MG tablet Take 81 mg by mouth daily.    Marland Kitchen  budesonide-formoterol (SYMBICORT) 80-4.5 MCG/ACT inhaler Inhale 2 puffs into the lungs 2 (two) times daily.  Marland Kitchen diltiazem (CARDIZEM CD) 180 MG 24 hr capsule Take 180 mg by mouth daily.  . fluticasone (FLONASE) 50 MCG/ACT nasal spray Place 2 sprays into both nostrils daily.  Marland Kitchen losartan-hydrochlorothiazide (HYZAAR) 100-25 MG per tablet Take 1 tablet by mouth daily.  . meloxicam (MOBIC) 7.5 MG tablet 1 tablet Daily.  . pravastatin (PRAVACHOL) 40 MG tablet Take 40 mg by mouth daily.  Marland Kitchen spironolactone (ALDACTONE) 50 MG tablet Take 50 mg by mouth daily.  . temazepam (RESTORIL) 15 MG capsule 1 tablet Daily.  . TOPROL XL 50 MG 24 hr tablet 1 tablet Daily.

## 2013-12-08 NOTE — Progress Notes (Signed)
Spirometry before and after done today. 

## 2013-12-09 NOTE — Assessment & Plan Note (Signed)
Pt qualifies for IMPACT trial and enrolled.

## 2013-12-12 ENCOUNTER — Encounter (HOSPITAL_COMMUNITY)
Admission: RE | Admit: 2013-12-12 | Discharge: 2013-12-12 | Disposition: A | Discharge status: Home / Self Care | Payer: Self-pay | Source: Ambulatory Visit | Attending: Internal Medicine | Admitting: Internal Medicine

## 2013-12-12 NOTE — Telephone Encounter (Signed)
Pt has been enrolled in COPD study and is receiving her medication through them so she does not need to use insurance at this time. I advised we will close this message and once she has completed the study we will address at that time. [place] Bing, CMA

## 2013-12-14 ENCOUNTER — Encounter (HOSPITAL_COMMUNITY): Payer: Self-pay

## 2013-12-18 ENCOUNTER — Ambulatory Visit: Payer: 59 | Admitting: Internal Medicine

## 2013-12-19 ENCOUNTER — Encounter (HOSPITAL_COMMUNITY)
Admission: RE | Admit: 2013-12-19 | Discharge: 2013-12-19 | Disposition: A | Discharge status: Home / Self Care | Payer: Self-pay | Source: Ambulatory Visit | Attending: Internal Medicine | Admitting: Internal Medicine

## 2013-12-21 ENCOUNTER — Encounter (HOSPITAL_COMMUNITY)
Admission: RE | Admit: 2013-12-21 | Discharge: 2013-12-21 | Disposition: A | Discharge status: Home / Self Care | Payer: Self-pay | Source: Ambulatory Visit | Attending: Internal Medicine | Admitting: Internal Medicine

## 2013-12-22 ENCOUNTER — Encounter: Payer: Self-pay | Admitting: Internal Medicine

## 2013-12-22 ENCOUNTER — Ambulatory Visit (INDEPENDENT_AMBULATORY_CARE_PROVIDER_SITE_OTHER): Payer: 59 | Admitting: Internal Medicine

## 2013-12-22 VITALS — BP 122/76 | HR 77 | Ht 67.0 in | Wt 197.2 lb

## 2013-12-22 DIAGNOSIS — J449 Chronic obstructive pulmonary disease, unspecified: Secondary | ICD-10-CM

## 2013-12-22 NOTE — Progress Notes (Signed)
Subjective:    Patient ID: Katie Greene, female    DOB: [birth year], 58 y.o.   MRN: [id number]  HPI   #COPD - Gold stage 3. On home o2 since July 2012. MM genotype  PFT FVC fev1 ratio BD fev1 TLC DLCO comment  03/31/12 1.65L/55% 0.78L/32% 47/58% 11% 4.8L/88% 6.5/23%% Gold stage 3 copd with post bd fev1 0.86L/36%  - RX spiriva/symbicort - [employer] disability Nov 2013 - Pulm rehab Early 2014  #AECOPD  - February 2014 outpatient treatment - April 2014 telephone treatment for acute bronchitis without prednisone - 07/28/2013: Telephone treatment with Levaquin and prednisone  #SMoking  - quit 2013  #Overweight  - Body mass index is 28.54 kg/(m^2).  -- Body mass index is 29.91 kg/(m^2).  On 06/16/2013 - Body mass index is 30.6 kg/(m^2).     #Lung cancer rrisk (age, smoking, strong family hx of several cancers) - CT 09/22/12 - emphysema with MPN of 45mms several - next ct to be done spring 2015   OV 06/16/2013  Followup COPD and chronic sinus drainage  - Overall doing well. COPD symptoms are stable and reflected by a stable COPD cat score of 24 reflected below. She has already had a flu shot. She is enjoying maintenance rehabilitation. She continues with oxygen and her medications without fail.  She is interested in participating in COPD research studies  Of note, she seen the cardiologist and been on Lasix therapy. This is giving some cramps and her medicines are being adjusted. Today she had some blood work    Followup COPD and chronic sinus drainage   Impact Study    COPD: Overall this is stable. Dyspnea is slightly worse but this is associated with weight gain as documented above and the flow sheet. She continues with maintenance pulmonary rehabilitation and she benefits from this. No change in baseline cough or sputum volume of sputum color. She continues with Spiriva but she feels that the technical quality of the MDI is not good. She is opening to triad Spiriva  respimat. She continues on Symbicort. She is also on oxygen. She is interested in research trials of [employer] and [employer] COPD trials. I explained in detail what these trials entailed and emphasized this is not treatment but research. She's open to participation along with using a hand-held computer  device for this. She has not had Prevnar vaccine  Recurrent acute exacerbation COPD: Despite using N. acetylcysteine she did have a exacerbation around Christmas 2015  Sinus drainage: stable  Lung nodules: Annual CT due tin 3 months  Others: Low dose Lasix not helping with edema or dyspnea. High dose Lasix resulted in cramps. I have asked her to Dr. cardiologist and ask if potassium replacement would help   OV 11/22/13 Chief Complaint  Patient presents with  . Research visit   Research visit. Dx copd   Randomizing  No overall changes from prior OV   12/08/2013 Chief Complaint  Patient presents with  . Research Visit    Impact Study  No changes from 11/22/13.  Here to get randomized.   No acute changes on CXR.  Labs ok.     OV 12/22/2013  Chief Complaint  Patient presents with  . Follow-up    Pt states breathing is unchanged. C/o DOE and mild dry cough. Denies CP.    Routine medical care visit for COPD and chronic respiratory failure. This is not research visit. SHe is now randomized to the equivalent of BREO V ANORO v triple  mdi of GSK (LAMA+LABA+ICS) blinded on GSK impact study.  She says she is compliant. SHe does not know what she is getting. Compliant with her inahler and o2. Feels good. CAT score dripped to 15 after entering study. No new issues.    CAT COPD Symptom & Quality of Life Score ([employer]) 0 is no burden. 5 is highest burden 06/10/2012  12/06/2012  03/13/2013  06/16/2013  12/22/2013 On IMPACT Trial  Never Cough -> Cough all the time 3 3 2 2 2   No phlegm in chest -> Chest is full of phlegm 2 3 2 3 1   No chest tightness -> Chest feels very tight 2 2 3  2  0  No dyspnea for 1 flight stairs/hill -> Very dyspneic for 1 flight of stairs 5 5 5 5 5   No limitations for ADL at home -> Very limited with ADL at home 5 4 4 3 4   Confident leaving home -> Not at all confident leaving home 3 2 3 2 1   Sleep soundly -> Do not sleep soundly because of lung condition 4 3 4 4 1   Lots of Energy -> No energy at all 4 3 3 3 1   TOTAL Score (max 40)  28 25 26 24 15     Past, Family, Social reviewed: no change since last visit   Review of Systems  Constitutional: Negative for fever and unexpected weight change.  HENT: Negative for congestion, dental problem, ear pain, nosebleeds, postnasal drip, rhinorrhea, sinus pressure, sneezing, sore throat and trouble swallowing.   Eyes: Negative for redness and itching.  Respiratory: Positive for cough and shortness of breath. Negative for chest tightness and wheezing.   Cardiovascular: Negative for palpitations and leg swelling.  Gastrointestinal: Negative for nausea and vomiting.  Genitourinary: Negative for dysuria.  Musculoskeletal: Negative for joint swelling.  Skin: Negative for rash.  Neurological: Negative for headaches.  Hematological: Does not bruise/bleed easily.  Psychiatric/Behavioral: Negative for dysphoric mood. The patient is not nervous/anxious.        Objective:   Physical Exam  Filed Vitals:   12/22/13 1001  Height: 5\' 7"  (1.702 m)  Weight: 197 lb 3.2 oz (89.449 kg)    Filed Vitals:   12/22/13 1001  BP: 122/76  Pulse: 77  Height: 5\' 7"  (1.702 m)  Weight: 197 lb 3.2 oz (89.449 kg)  SpO2: 95%   itals reviewed. Constitutional: She is oriented to person, place, and time. She appears well-developed and well-nourished. No distress.  Body mass index is 30.57 kg/(m^2). Looks wel  HENT:  Head: Normocephalic and atraumatic.  Right Ear: External ear normal.  Left Ear: External ear normal.  Mouth/Throat: Oropharynx is clear and moist. No oropharyngeal exudate.  Eyes: Conjunctivae and EOM are  normal. Pupils are equal, round, and reactive to light. Right eye exhibits no discharge. Left eye exhibits no discharge. No scleral icterus.  Neck: Normal range of motion. Neck supple. No JVD present. No tracheal deviation present. No thyromegaly present.  Cardiovascular: Normal rate, regular rhythm, normal heart sounds and intact distal pulses.  Exam reveals no gallop and no friction rub.   No murmur heard. Pulmonary/Chest: Effort normal and breath sounds normal. No respiratory distress. She has no wheezes. She has no rales. She exhibits no tenderness.  o2 on  Abdominal: Soft. Bowel sounds are normal. She exhibits no distension and no mass. There is no tenderness. There is no rebound and no guarding.  Musculoskeletal: Normal range of motion. She exhibits no edema and no tenderness.  Lymphadenopathy:    She has no cervical adenopathy.  Neurological: She is alert and oriented to person, place, and time. She has normal reflexes. No cranial nerve deficit. She exhibits normal muscle tone. Coordination normal.  Skin: Skin is warm and dry. No rash noted. She is not diaphoretic. No erythema. No pallor.  Psychiatric: She has a normal mood and affect. Her behavior is normal. Judgment and thought content normal.           Assessment & Plan:

## 2013-12-22 NOTE — Patient Instructions (Signed)
#  COPD  - stable. In fact your symptom scores are better than before  - continue on IMPACT trial without fail  - continue o2   #FOllowup  - return for research per their schedule  - return for my office visit for clinical care (not research) in 8 months 

## 2013-12-23 NOTE — Assessment & Plan Note (Signed)
#  COPD  - stable. In fact your symptom scores are better than before  - continue on IMPACT trial without fail  - continue o2   #FOllowup  - return for research per their schedule  - return for my office visit for clinical care (not research) in 8 months

## 2013-12-26 ENCOUNTER — Encounter (HOSPITAL_COMMUNITY)
Admission: RE | Admit: 2013-12-26 | Discharge: 2013-12-26 | Disposition: A | Discharge status: Home / Self Care | Payer: Self-pay | Source: Ambulatory Visit | Attending: Internal Medicine | Admitting: Internal Medicine

## 2013-12-28 ENCOUNTER — Encounter (HOSPITAL_COMMUNITY): Payer: Self-pay

## 2014-01-02 ENCOUNTER — Encounter (HOSPITAL_COMMUNITY)
Admission: RE | Admit: 2014-01-02 | Discharge: 2014-01-02 | Disposition: A | Discharge status: Home / Self Care | Payer: Self-pay | Source: Ambulatory Visit | Attending: Internal Medicine | Admitting: Internal Medicine

## 2014-01-02 DIAGNOSIS — J449 Chronic obstructive pulmonary disease, unspecified: Secondary | ICD-10-CM | POA: Insufficient documentation

## 2014-01-02 DIAGNOSIS — J4489 Other specified chronic obstructive pulmonary disease: Secondary | ICD-10-CM | POA: Insufficient documentation

## 2014-01-02 DIAGNOSIS — Z5189 Encounter for other specified aftercare: Secondary | ICD-10-CM | POA: Insufficient documentation

## 2014-01-04 ENCOUNTER — Ambulatory Visit: Payer: Self-pay | Admitting: Pulmonary Disease

## 2014-01-04 ENCOUNTER — Ambulatory Visit: Payer: Self-pay | Admitting: Internal Medicine

## 2014-01-04 ENCOUNTER — Encounter: Payer: Self-pay | Admitting: Pulmonary Disease

## 2014-01-04 ENCOUNTER — Other Ambulatory Visit: Payer: Self-pay | Admitting: Internal Medicine

## 2014-01-04 ENCOUNTER — Encounter (HOSPITAL_COMMUNITY): Payer: Self-pay

## 2014-01-04 VITALS — BP 128/78 | HR 77 | Temp 98.1°F | Resp 18 | Ht 67.0 in | Wt 197.0 lb

## 2014-01-04 DIAGNOSIS — R0682 Tachypnea, not elsewhere classified: Secondary | ICD-10-CM

## 2014-01-04 DIAGNOSIS — J449 Chronic obstructive pulmonary disease, unspecified: Secondary | ICD-10-CM

## 2014-01-04 DIAGNOSIS — Z006 Encounter for examination for normal comparison and control in clinical research program: Secondary | ICD-10-CM

## 2014-01-04 DIAGNOSIS — R0602 Shortness of breath: Secondary | ICD-10-CM

## 2014-01-04 LAB — PULMONARY FUNCTION TEST
FEF 25-75 POST: 0.31 L/s
FEF 25-75 Pre: 0.24 L/sec
FEF2575-%Change-Post: 29 %
FEF2575-%PRED-POST: 13 %
FEF2575-%Pred-Pre: 10 %
FEV1-%Change-Post: 11 %
FEV1-%Pred-Post: 32 %
FEV1-%Pred-Pre: 29 %
FEV1-POST: 0.78 L
FEV1-PRE: 0.7 L
FEV1FVC-%CHANGE-POST: 8 %
FEV1FVC-%PRED-PRE: 55 %
FEV6-%CHANGE-POST: 6 %
FEV6-%PRED-PRE: 49 %
FEV6-%Pred-Post: 52 %
FEV6-POST: 1.57 L
FEV6-Pre: 1.48 L
FEV6FVC-%CHANGE-POST: 4 %
FEV6FVC-%Pred-Post: 98 %
FEV6FVC-%Pred-Pre: 94 %
FVC-%CHANGE-POST: 2 %
FVC-%PRED-POST: 53 %
FVC-%Pred-Pre: 52 %
FVC-Post: 1.64 L
FVC-Pre: 1.6 L
PRE FEV6/FVC RATIO: 92 %
Post FEV1/FVC ratio: 48 %
Post FEV6/FVC ratio: 96 %
Pre FEV1/FVC ratio: 44 %

## 2014-01-04 NOTE — Progress Notes (Signed)
Spirometry pre and post done today. 

## 2014-01-04 NOTE — Assessment & Plan Note (Signed)
Stable interval, as above

## 2014-01-04 NOTE — Patient Instructions (Signed)
Keep taking your study drug as you are doing We will see you back as previously scheduled

## 2014-01-04 NOTE — Assessment & Plan Note (Signed)
This has been a stable interval.  She is doing well on study drug. Plan: -continue study drug and f/u as scheduled

## 2014-01-04 NOTE — Progress Notes (Signed)
   Subjective:    Patient ID: Katie Greene, female    DOB: March 30, 1956, 58 y.o.   MRN: [id number]  HPI  Advanced COPD, here for a study visit for IMPACT Trial  01/04/2014 research study visit> Gentri has been doing fine since her last visit. No trouble breathing. No cough. She's not had to have any doctor's visits for antibiotics or steroids since she was last seen in our office. She has no complaints about the medication. She does not have problems with her voice or sore throat.  She has been taking the medication as scheduled.  Past Medical History  Diagnosis Date  . Anxiety   . Arthritis   . Hyperlipidemia   . Hypertension   . Palpitations   . Fatigue   . Chest pain     Improved     Review of Systems     Objective:   Physical Exam Filed Vitals:   01/04/14 0817 01/04/14 0818  BP: 128/78 128/78  Pulse: 77   Temp: 98.1 F (36.7 C)   TempSrc: Oral   Resp: 18   Height: 5\' 7"  (1.702 m)   Weight: 197 lb (89.359 kg)   SpO2: 95% 95%  2LNC  Gen: well appearing, no acute distress HEENT: NCAT, EOMi, OP clear without thrush, neck supple without masses PULM: poor air movement but no wheezing CV: RRR, no mgr, no JVD AB: BS+, soft, nontender, no hsm Ext: warm, no edema, no clubbing, no cyanosis      Assessment & Plan:   COPD (chronic obstructive pulmonary disease) This has been a stable interval.  She is doing well on study drug. Plan: -continue study drug and f/u as scheduled  Research exam Stable interval, as above   Updated Medication List Outpatient Encounter Prescriptions as of 01/04/2014  Medication Sig  . Acetylcysteine 600 MG CAPS Take 1 capsule by mouth 2 (two) times daily.  . Aclidinium Bromide (TUDORZA PRESSAIR) 400 MCG/ACT AEPB Inhale 1 puff into the lungs 2 (two) times daily.  Marland Kitchen albuterol (VENTOLIN HFA) 108 (90 BASE) MCG/ACT inhaler Inhale 2 puffs into the lungs every 6 (six) hours as needed.  Marland Kitchen aspirin 81 MG tablet Take 81 mg by mouth daily.    .  budesonide-formoterol (SYMBICORT) 80-4.5 MCG/ACT inhaler Inhale 2 puffs into the lungs 2 (two) times daily.  Marland Kitchen diltiazem (CARDIZEM CD) 180 MG 24 hr capsule Take 180 mg by mouth daily.  . ferrous fumarate (HEMOCYTE - 106 MG FE) 325 (106 FE) MG TABS tablet Take 1 tablet by mouth daily.  . fluticasone (FLONASE) 50 MCG/ACT nasal spray Place 2 sprays into both nostrils daily.  Marland Kitchen losartan-hydrochlorothiazide (HYZAAR) 100-25 MG per tablet Take 1 tablet by mouth daily.  . meloxicam (MOBIC) 7.5 MG tablet 1 tablet Daily.  . pravastatin (PRAVACHOL) 40 MG tablet Take 40 mg by mouth daily.  Marland Kitchen spironolactone (ALDACTONE) 50 MG tablet Take 50 mg by mouth daily.  . temazepam (RESTORIL) 15 MG capsule 1 tablet Daily.  . TOPROL XL 50 MG 24 hr tablet 1 tablet Daily.

## 2014-01-09 ENCOUNTER — Encounter (HOSPITAL_COMMUNITY)
Admission: RE | Admit: 2014-01-09 | Discharge: 2014-01-09 | Disposition: A | Discharge status: Home / Self Care | Payer: Self-pay | Source: Ambulatory Visit | Attending: Internal Medicine | Admitting: Internal Medicine

## 2014-01-11 ENCOUNTER — Encounter (HOSPITAL_COMMUNITY)
Admission: RE | Admit: 2014-01-11 | Discharge: 2014-01-11 | Disposition: A | Discharge status: Home / Self Care | Payer: Self-pay | Source: Ambulatory Visit | Attending: Internal Medicine | Admitting: Internal Medicine

## 2014-01-16 ENCOUNTER — Encounter (HOSPITAL_COMMUNITY): Payer: Self-pay

## 2014-01-18 ENCOUNTER — Encounter (HOSPITAL_COMMUNITY): Payer: Self-pay

## 2014-01-23 ENCOUNTER — Encounter (HOSPITAL_COMMUNITY)
Admission: RE | Admit: 2014-01-23 | Discharge: 2014-01-23 | Disposition: A | Discharge status: Home / Self Care | Payer: Self-pay | Source: Ambulatory Visit | Attending: Internal Medicine | Admitting: Internal Medicine

## 2014-01-25 ENCOUNTER — Encounter (HOSPITAL_COMMUNITY)
Admission: RE | Admit: 2014-01-25 | Discharge: 2014-01-25 | Disposition: A | Discharge status: Home / Self Care | Payer: Self-pay | Source: Ambulatory Visit | Attending: Internal Medicine | Admitting: Internal Medicine

## 2014-01-30 ENCOUNTER — Encounter (HOSPITAL_COMMUNITY): Payer: Self-pay

## 2014-02-01 ENCOUNTER — Encounter (HOSPITAL_COMMUNITY)
Admission: RE | Admit: 2014-02-01 | Discharge: 2014-02-01 | Disposition: A | Discharge status: Home / Self Care | Payer: Self-pay | Source: Ambulatory Visit | Attending: Internal Medicine | Admitting: Internal Medicine

## 2014-02-01 DIAGNOSIS — J449 Chronic obstructive pulmonary disease, unspecified: Secondary | ICD-10-CM | POA: Insufficient documentation

## 2014-02-01 DIAGNOSIS — J4489 Other specified chronic obstructive pulmonary disease: Secondary | ICD-10-CM | POA: Insufficient documentation

## 2014-02-01 DIAGNOSIS — Z5189 Encounter for other specified aftercare: Secondary | ICD-10-CM | POA: Insufficient documentation

## 2014-02-06 ENCOUNTER — Encounter (HOSPITAL_COMMUNITY)
Admission: RE | Admit: 2014-02-06 | Discharge: 2014-02-06 | Disposition: A | Discharge status: Home / Self Care | Payer: Self-pay | Source: Ambulatory Visit | Attending: Internal Medicine | Admitting: Internal Medicine

## 2014-02-08 ENCOUNTER — Encounter (HOSPITAL_COMMUNITY)
Admission: RE | Admit: 2014-02-08 | Discharge: 2014-02-08 | Disposition: A | Discharge status: Home / Self Care | Payer: Self-pay | Source: Ambulatory Visit | Attending: Internal Medicine | Admitting: Internal Medicine

## 2014-02-13 ENCOUNTER — Encounter (HOSPITAL_COMMUNITY)
Admission: RE | Admit: 2014-02-13 | Discharge: 2014-02-13 | Disposition: A | Discharge status: Home / Self Care | Payer: Self-pay | Source: Ambulatory Visit | Attending: Internal Medicine | Admitting: Internal Medicine

## 2014-02-15 ENCOUNTER — Encounter (HOSPITAL_COMMUNITY)
Admission: RE | Admit: 2014-02-15 | Discharge: 2014-02-15 | Disposition: A | Discharge status: Home / Self Care | Payer: Self-pay | Source: Ambulatory Visit | Attending: Internal Medicine | Admitting: Internal Medicine

## 2014-02-20 ENCOUNTER — Encounter (HOSPITAL_COMMUNITY)
Admission: RE | Admit: 2014-02-20 | Discharge: 2014-02-20 | Disposition: A | Discharge status: Home / Self Care | Payer: Self-pay | Source: Ambulatory Visit | Attending: Internal Medicine | Admitting: Internal Medicine

## 2014-02-22 ENCOUNTER — Encounter (HOSPITAL_COMMUNITY): Payer: Self-pay

## 2014-02-27 ENCOUNTER — Encounter (HOSPITAL_COMMUNITY)
Admission: RE | Admit: 2014-02-27 | Discharge: 2014-02-27 | Disposition: A | Discharge status: Home / Self Care | Payer: Self-pay | Source: Ambulatory Visit | Attending: Internal Medicine | Admitting: Internal Medicine

## 2014-03-01 ENCOUNTER — Encounter (HOSPITAL_COMMUNITY)
Admission: RE | Admit: 2014-03-01 | Discharge: 2014-03-01 | Disposition: A | Discharge status: Home / Self Care | Payer: Self-pay | Source: Ambulatory Visit | Attending: Internal Medicine | Admitting: Internal Medicine

## 2014-03-06 ENCOUNTER — Encounter (HOSPITAL_COMMUNITY)
Admission: RE | Admit: 2014-03-06 | Discharge: 2014-03-06 | Disposition: A | Discharge status: Home / Self Care | Payer: Self-pay | Source: Ambulatory Visit | Attending: Internal Medicine | Admitting: Internal Medicine

## 2014-03-06 DIAGNOSIS — Z5189 Encounter for other specified aftercare: Secondary | ICD-10-CM | POA: Insufficient documentation

## 2014-03-06 DIAGNOSIS — J449 Chronic obstructive pulmonary disease, unspecified: Secondary | ICD-10-CM | POA: Insufficient documentation

## 2014-03-06 DIAGNOSIS — J4489 Other specified chronic obstructive pulmonary disease: Secondary | ICD-10-CM | POA: Insufficient documentation

## 2014-03-08 ENCOUNTER — Encounter (HOSPITAL_COMMUNITY)
Admission: RE | Admit: 2014-03-08 | Discharge: 2014-03-08 | Disposition: A | Discharge status: Home / Self Care | Payer: Self-pay | Source: Ambulatory Visit | Attending: Internal Medicine | Admitting: Internal Medicine

## 2014-03-13 ENCOUNTER — Encounter (HOSPITAL_COMMUNITY)
Admission: RE | Admit: 2014-03-13 | Discharge: 2014-03-13 | Disposition: A | Discharge status: Home / Self Care | Payer: Self-pay | Source: Ambulatory Visit | Attending: Internal Medicine | Admitting: Internal Medicine

## 2014-03-15 ENCOUNTER — Encounter (HOSPITAL_COMMUNITY)
Admission: RE | Admit: 2014-03-15 | Discharge: 2014-03-15 | Disposition: A | Discharge status: Home / Self Care | Payer: Self-pay | Source: Ambulatory Visit | Attending: Internal Medicine | Admitting: Internal Medicine

## 2014-03-20 ENCOUNTER — Encounter (HOSPITAL_COMMUNITY): Payer: Self-pay

## 2014-03-22 ENCOUNTER — Encounter (HOSPITAL_COMMUNITY)
Admission: RE | Admit: 2014-03-22 | Discharge: 2014-03-22 | Disposition: A | Discharge status: Home / Self Care | Payer: Self-pay | Source: Ambulatory Visit | Attending: Internal Medicine | Admitting: Internal Medicine

## 2014-03-23 ENCOUNTER — Other Ambulatory Visit: Payer: Self-pay | Admitting: Internal Medicine

## 2014-03-23 DIAGNOSIS — R0602 Shortness of breath: Secondary | ICD-10-CM

## 2014-03-26 ENCOUNTER — Ambulatory Visit: Payer: Self-pay | Admitting: Internal Medicine

## 2014-03-26 ENCOUNTER — Telehealth (HOSPITAL_COMMUNITY): Payer: Self-pay | Admitting: *Deleted

## 2014-03-26 ENCOUNTER — Encounter: Payer: Self-pay | Admitting: Internal Medicine

## 2014-03-26 VITALS — BP 110/80 | HR 112 | Temp 98.6°F | Ht 67.5 in | Wt 196.0 lb

## 2014-03-26 DIAGNOSIS — R0602 Shortness of breath: Secondary | ICD-10-CM

## 2014-03-26 DIAGNOSIS — R911 Solitary pulmonary nodule: Secondary | ICD-10-CM

## 2014-03-26 DIAGNOSIS — J9611 Chronic respiratory failure with hypoxia: Secondary | ICD-10-CM

## 2014-03-26 DIAGNOSIS — J449 Chronic obstructive pulmonary disease, unspecified: Secondary | ICD-10-CM

## 2014-03-26 DIAGNOSIS — Z006 Encounter for examination for normal comparison and control in clinical research program: Secondary | ICD-10-CM

## 2014-03-26 LAB — PULMONARY FUNCTION TEST
FEF 25-75 POST: 0.36 L/s
FEF 25-75 Pre: 0.28 L/sec
FEF2575-%Change-Post: 28 %
FEF2575-%Pred-Post: 15 %
FEF2575-%Pred-Pre: 12 %
FEV1-%CHANGE-POST: 9 %
FEV1-%PRED-POST: 35 %
FEV1-%Pred-Pre: 32 %
FEV1-Post: 0.85 L
FEV1-Pre: 0.78 L
FEV1FVC-%Change-Post: 1 %
FEV1FVC-%PRED-PRE: 59 %
FEV6-%Change-Post: 10 %
FEV6-%Pred-Post: 59 %
FEV6-%Pred-Pre: 53 %
FEV6-Post: 1.75 L
FEV6-Pre: 1.58 L
FEV6FVC-%CHANGE-POST: 2 %
FEV6FVC-%Pred-Post: 100 %
FEV6FVC-%Pred-Pre: 97 %
FVC-%Change-Post: 8 %
FVC-%PRED-PRE: 54 %
FVC-%Pred-Post: 59 %
FVC-Post: 1.8 L
FVC-Pre: 1.67 L
PRE FEV6/FVC RATIO: 95 %
Post FEV1/FVC ratio: 47 %
Post FEV6/FVC ratio: 97 %
Pre FEV1/FVC ratio: 47 %

## 2014-03-26 MED ORDER — NYSTATIN 100000 UNIT/ML MT SUSP: 5.0000 mL | Freq: Four times a day (QID) | OROMUCOSAL | Status: DC

## 2014-03-26 NOTE — Progress Notes (Addendum)
Patient ID: Katie Greene, female   DOB: [birth year], 57 y.o.   MRN: [id number]  OV 03/26/2014  [employer]K IMPACT research study: blinded >/= 52 week randomized trial of ANORO equivalent v BREO equivalent v GSK triple MDI (ICS + LABA + LAMA)  This is visit 4. She is doing well. She is 100% compliant with study drug. She rinses a month. She has not had any exacerbations. She is compliant with her diarrhea. Symptoms are better than when she was taking inhalers before research study participation. There no new issues. She has not had any admissions.  Past history: She did have some flare up of a knee arthralgia but otherwise well.       Objective HR  Filed Vitals:   03/26/14 1007 03/26/14 1009  BP: 110/80 110/80  Pulse: 112   Temp: 98.6 F (37 C)   TempSrc: Oral   Height: 5' 7.5" (1.715 m)   Weight: 196 lb (88.905 kg)   SpO2: 98% 98%     Exam - Looks well  - HEENT exam: Has mild oral thrush in the hard palate. This is new - T respiratory exam: Overall air entry is diminished but no wheezes or crackles - Cardia vascular exam: Mildly tachycardic but otherwise normal heart sounds - Abdomen: Obese nontender no organomegaly soft - Extremities: No cyanosis no clubbing no pedal edema - Skin exam: Intact     Labs Spirometry Post BD fev1 0.85L/35%, Ratio 47, This is a 9% BD response    ASSSESSMENT PLAN of MD #COPD - research  - continue o2 as before  - continue study drug on [employer] Impact study  ## Oral thrush - you have mild oral thrush - For Oral thrush: Take Suspension (swish and swallow): 500,000 units 4 times/day for 5 days; swish in the mouth and retain for as long as possible (several minutes) before swallowing  #Fast heart rate/tachyardia - clinically sinus - this could be from study drug; will keep an eye but report to study monitor as an ADVERSE EVENT  #Lung Nodule  - last CT was feb 2014  - you need repeat annual CT chest wo contrast now for 1 year scan  #FOllowup  -per research protocol    Dr. Brand Males, M.D., [facility].C.P Pulmonary and Critical Care Medicine Staff Physician [place] [facility] Pager: [phone], If no answer or between  15:00h - 7:00h: call [phone]  03/26/2014 11:02 AM

## 2014-03-26 NOTE — Progress Notes (Signed)
[employer]K IMPACT research study: blinded >/= 52 week randomized trial of ANORO equivalent v BREO equivalent v GSK triple MDI (ICS + LABA + LAMA)   IMPACT VISIT:  Patient returns for visit 4.  She is 100% compliant with all study drug at this time. Should be noted the subject returned the packaging for 2 inhalers but not the inhaler itself.  Stressed the importance of returning all study drug regardless of it being empty.  She also returns her health care review form, a copy has been made.  No new AE's reported related to study drug.  No oral candidiasis, as patient is rinsing well.  All study procedures have been completed per protocol on this visit.  Subject dispensed 3 study drug inhalers, 1 rescue inhaler and the previous inhaler has been redispensed per protocol allowance.  Patient has no questions during this visit, and will return around November 20th for visit 5.  Subject will be called in mid to late October to schedule this visit.

## 2014-03-26 NOTE — Patient Instructions (Signed)
#  COPD - research  - continue o2 as before  - continue study drug on [place] Impact study  ## Oral thrush - you have mild oral thrush - For Oral thrush: Take Suspension (swish and swallow): 500,000 units 4 times/day for 5 days; swish in the mouth and retain for as long as possible (several minutes) before swallowing  #Fast heart rate - this could be from study drug; will keep an eye  #Lung Nodule  - last CT was feb 2014  - you need repeat annual CT chest wo contrast now for 1 year scan  #FOllowup   -per research protocol

## 2014-03-26 NOTE — Progress Notes (Signed)
Spirometry pre and post done today. 

## 2014-03-27 ENCOUNTER — Telehealth: Payer: Self-pay | Admitting: Internal Medicine

## 2014-03-27 ENCOUNTER — Encounter (HOSPITAL_COMMUNITY): Payer: Self-pay

## 2014-03-27 NOTE — Telephone Encounter (Signed)
Hi [name]  Did everything ok with visit 4 on impact for Katie Greene   THanks  Dr. Brand Males, M.D., [facility].C.P Pulmonary and Critical Care Medicine Staff Physician [place] [facility] Pager: [phone], If no answer or between  15:00h - 7:00h: call [phone]  03/27/2014 8:53 AM

## 2014-03-27 NOTE — Telephone Encounter (Signed)
Everything went well.  She will return in Nov for visit 5

## 2014-03-29 ENCOUNTER — Ambulatory Visit (INDEPENDENT_AMBULATORY_CARE_PROVIDER_SITE_OTHER)
Admission: RE | Admit: 2014-03-29 | Discharge: 2014-03-29 | Disposition: A | Discharge status: Home / Self Care | Payer: 59 | Source: Ambulatory Visit | Attending: Internal Medicine | Admitting: Internal Medicine

## 2014-03-29 ENCOUNTER — Encounter (HOSPITAL_COMMUNITY): Payer: Self-pay

## 2014-03-29 DIAGNOSIS — J9610 Chronic respiratory failure, unspecified whether with hypoxia or hypercapnia: Secondary | ICD-10-CM

## 2014-03-29 DIAGNOSIS — R911 Solitary pulmonary nodule: Secondary | ICD-10-CM

## 2014-03-29 DIAGNOSIS — J9611 Chronic respiratory failure with hypoxia: Secondary | ICD-10-CM

## 2014-03-29 DIAGNOSIS — R0902 Hypoxemia: Secondary | ICD-10-CM

## 2014-03-29 DIAGNOSIS — J449 Chronic obstructive pulmonary disease, unspecified: Secondary | ICD-10-CM

## 2014-03-30 ENCOUNTER — Telehealth: Payer: Self-pay | Admitting: Internal Medicine

## 2014-03-30 NOTE — Telephone Encounter (Signed)
Let Katie Greene kow that CT chest aug 2015 compared to feb 2014 shows stable nodules. No lung cancer. No further fu of these nodules   She has co art calcification  - normal stress test 2010. So if no chest pain ok, if chest pain, should see cards  Ct Chest Wo Contrast  03/29/2014   CLINICAL DATA:  Followup evaluation of lung nodule. Worsening shortness of breath.  EXAM: CT CHEST WITHOUT CONTRAST  TECHNIQUE: Multidetector CT imaging of the chest was performed following the standard protocol without IV contrast.  COMPARISON:  Chest CT 09/22/2012.  FINDINGS: Mediastinum: Heart size is normal. There is no significant pericardial fluid, thickening or pericardial calcification. No pathologically enlarged mediastinal or hilar lymph nodes. There is atherosclerosis of the thoracic aorta, the great vessels of the mediastinum and the coronary arteries, including calcified atherosclerotic plaque in the left anterior descending and right coronary arteries. Esophagus is unremarkable in appearance.  Lungs/Pleura: 4 mm subpleural nodule in the periphery of the lateral segment of the right middle lobe (image 37 of series 3) is unchanged. 3 mm nodule associated with the minor fissure (image 31 of series 3) is also unchanged. 2 mm left upper lobe nodule (image 14 of series 3) is slightly smaller than the prior study. 3 mm left lower lobe nodule (image 42 of series 3) is also unchanged. Several other tiny 1-2 mm pulmonary nodules are also noted, and also unchanged compared to the prior examination. These findings are benign. No other larger suspicious appearing pulmonary nodules or masses are noted. No acute consolidative airspace disease. No pleural effusions. There is a background of mild centrilobular and moderate paraseptal emphysema. Mild diffuse bronchial wall thickening.  Upper Abdomen: Unremarkable.  Musculoskeletal: There are no aggressive appearing lytic or blastic lesions noted in the visualized portions of the  skeleton.  IMPRESSION: 1. Multiple tiny pulmonary nodules measuring 3 mm or less in size are all unchanged in size, number and pattern of distribution compared to prior study from 09/22/2012. These can be considered benign and do not require further imaging followup. 2. Mild diffuse bronchial wall thickening with mild centrilobular and moderate paraseptal emphysema; imaging findings suggestive of underlying COPD. 3. Atherosclerosis, including 2 vessel coronary artery disease. Please note that although the presence of coronary artery calcium documents the presence of coronary artery disease, the severity of this disease and any potential stenosis cannot be assessed on this non-gated CT examination. Assessment for potential risk factor modification, dietary therapy or pharmacologic therapy may be warranted, if clinically indicated.   Electronically Signed   By: Vinnie Langton M.D.   On: 03/29/2014 10:02

## 2014-04-02 ENCOUNTER — Encounter: Payer: Self-pay | Admitting: Internal Medicine

## 2014-04-02 NOTE — Telephone Encounter (Signed)
04/02/14 mychart message from pt: Message     Good Afternoon,    I would like to know if it is time for me to have the flu shot?or will you call me when its time to get it?I also had ct scan done on Thursday.Has Dr Chase Caller looked at the results yet? I have a runny nose that started yesterday.No fever,not a cold.Do I need to start taking anything to prevent this going into a cold?        Thanks,    Katie Greene    Email response to pt informing her that we do have flu shots and are administering them during ov's - once "flu season" begins, we will begin scheduling injections on the injection schedule.  Pt may call to check on this.  Regarding her runny nose, will ask pt for any additional symptoms but will recommend saline nasal spray, irrigation, claritin to help dry up the drainage.

## 2014-04-02 NOTE — Telephone Encounter (Signed)
Called and spoke to pt. Informed her of the results and recs per MR. Pt verbalized understanding and denied any further questions or concerns at this time. Pt stated that she is not having any CP and has not recently had CP.  Will forward to MR to make aware.

## 2014-04-02 NOTE — Telephone Encounter (Signed)
Ok thanks. Will close note 

## 2014-04-03 ENCOUNTER — Encounter (HOSPITAL_COMMUNITY): Payer: 59 | Attending: Internal Medicine

## 2014-04-03 ENCOUNTER — Telehealth (HOSPITAL_COMMUNITY): Payer: Self-pay | Admitting: *Deleted

## 2014-04-03 DIAGNOSIS — J449 Chronic obstructive pulmonary disease, unspecified: Secondary | ICD-10-CM | POA: Insufficient documentation

## 2014-04-03 DIAGNOSIS — Z5189 Encounter for other specified aftercare: Secondary | ICD-10-CM | POA: Insufficient documentation

## 2014-04-03 DIAGNOSIS — J4489 Other specified chronic obstructive pulmonary disease: Secondary | ICD-10-CM | POA: Insufficient documentation

## 2014-04-05 ENCOUNTER — Encounter (HOSPITAL_COMMUNITY): Payer: Self-pay

## 2014-04-10 ENCOUNTER — Telehealth (HOSPITAL_COMMUNITY): Payer: Self-pay

## 2014-04-10 ENCOUNTER — Encounter (HOSPITAL_COMMUNITY): Payer: Self-pay

## 2014-04-10 NOTE — Telephone Encounter (Signed)
Pulmonary Rehab Telephone Note:  Returned patients phone call. Patient stated her mother is sick and in the hospital and will require surgery. Patient is requesting to be discharged from the maintenance program. Encouraged patient to notify pulmonologist and to call when she is ready to re-enter program.

## 2014-04-12 ENCOUNTER — Encounter (HOSPITAL_COMMUNITY): Payer: Self-pay

## 2014-04-17 ENCOUNTER — Encounter (HOSPITAL_COMMUNITY): Payer: Self-pay

## 2014-04-19 ENCOUNTER — Encounter (HOSPITAL_COMMUNITY): Payer: Self-pay

## 2014-04-24 ENCOUNTER — Encounter (HOSPITAL_COMMUNITY): Payer: Self-pay

## 2014-04-26 ENCOUNTER — Encounter (HOSPITAL_COMMUNITY): Payer: Self-pay

## 2014-05-01 ENCOUNTER — Encounter (HOSPITAL_COMMUNITY): Payer: Self-pay

## 2014-05-03 ENCOUNTER — Encounter (HOSPITAL_COMMUNITY): Payer: Self-pay

## 2014-05-08 ENCOUNTER — Encounter (HOSPITAL_COMMUNITY): Payer: Self-pay

## 2014-05-10 ENCOUNTER — Encounter (HOSPITAL_COMMUNITY): Payer: Self-pay

## 2014-05-15 ENCOUNTER — Encounter (HOSPITAL_COMMUNITY): Payer: Self-pay

## 2014-05-17 ENCOUNTER — Encounter (HOSPITAL_COMMUNITY): Payer: Self-pay

## 2014-05-22 ENCOUNTER — Encounter (HOSPITAL_COMMUNITY): Payer: Self-pay

## 2014-05-24 ENCOUNTER — Encounter (HOSPITAL_COMMUNITY): Payer: Self-pay

## 2014-05-29 ENCOUNTER — Encounter (HOSPITAL_COMMUNITY): Payer: Self-pay

## 2014-05-31 ENCOUNTER — Encounter (HOSPITAL_COMMUNITY): Payer: Self-pay

## 2014-06-05 ENCOUNTER — Encounter (HOSPITAL_COMMUNITY): Payer: Self-pay

## 2014-06-07 ENCOUNTER — Encounter (HOSPITAL_COMMUNITY): Payer: Self-pay

## 2014-06-12 ENCOUNTER — Encounter (HOSPITAL_COMMUNITY): Payer: Self-pay

## 2014-06-14 ENCOUNTER — Encounter (HOSPITAL_COMMUNITY): Payer: Self-pay

## 2014-06-19 ENCOUNTER — Encounter (HOSPITAL_COMMUNITY): Payer: Self-pay

## 2014-06-20 ENCOUNTER — Other Ambulatory Visit: Payer: Self-pay | Admitting: Internal Medicine

## 2014-06-20 DIAGNOSIS — R0600 Dyspnea, unspecified: Secondary | ICD-10-CM

## 2014-06-21 ENCOUNTER — Encounter: Payer: Self-pay | Admitting: Adult Health

## 2014-06-21 ENCOUNTER — Ambulatory Visit (INDEPENDENT_AMBULATORY_CARE_PROVIDER_SITE_OTHER): Payer: 59 | Admitting: Adult Health

## 2014-06-21 ENCOUNTER — Encounter (HOSPITAL_COMMUNITY): Payer: Self-pay

## 2014-06-21 ENCOUNTER — Ambulatory Visit (INDEPENDENT_AMBULATORY_CARE_PROVIDER_SITE_OTHER): Payer: 59 | Admitting: Internal Medicine

## 2014-06-21 VITALS — BP 134/70 | HR 77 | Temp 98.4°F | Ht 67.0 in | Wt 184.0 lb

## 2014-06-21 DIAGNOSIS — Z006 Encounter for examination for normal comparison and control in clinical research program: Secondary | ICD-10-CM

## 2014-06-21 DIAGNOSIS — R0600 Dyspnea, unspecified: Secondary | ICD-10-CM

## 2014-06-21 LAB — PULMONARY FUNCTION TEST
FEF 25-75 Post: 0.3 L/sec
FEF 25-75 Pre: 0.31 L/sec
FEF2575-%CHANGE-POST: -4 %
FEF2575-%Pred-Post: 12 %
FEF2575-%Pred-Pre: 13 %
FEV1-%Change-Post: -4 %
FEV1-%PRED-PRE: 33 %
FEV1-%Pred-Post: 32 %
FEV1-POST: 0.78 L
FEV1-Pre: 0.81 L
FEV1FVC-%CHANGE-POST: -3 %
FEV1FVC-%Pred-Pre: 60 %
FEV6-%Change-Post: -2 %
FEV6-%Pred-Post: 54 %
FEV6-%Pred-Pre: 55 %
FEV6-PRE: 1.65 L
FEV6-Post: 1.61 L
FEV6FVC-%Change-Post: -1 %
FEV6FVC-%PRED-PRE: 100 %
FEV6FVC-%Pred-Post: 98 %
FVC-%Change-Post: 0 %
FVC-%Pred-Post: 55 %
FVC-%Pred-Pre: 55 %
FVC-POST: 1.69 L
FVC-Pre: 1.69 L
POST FEV1/FVC RATIO: 46 %
Post FEV6/FVC ratio: 96 %
Pre FEV1/FVC ratio: 48 %
Pre FEV6/FVC Ratio: 97 %

## 2014-06-21 NOTE — Progress Notes (Signed)
   Subjective:    Patient ID: Ronne Savoia, female    DOB: [birth year], 58 y.o.   MRN: [id number]  HPI   06/21/2014 Research Visit   GSK IMPACT research study: blinded >/= 52 week randomized trial of ANORO equivalent v BREO equivalent v GSK triple MDI (ICS + LABA + LAMA)  This is visit 5. She is doing well. She is 100% compliant with study drug.   Under some stress with mom in hospice.    Review of Systems  See HPI         Objective:   Physical Exam GEN: A/Ox3; pleasant , NAD, well nourished   HEENT:  [place]/AT,  EACs-clear, TMs-wnl, NOSE-clear, THROAT-clear, no lesions, no postnasal drip or exudate noted. No thrush noted.   NECK:  Supple w/ fair ROM; no JVD; normal carotid impulses w/o bruits; no thyromegaly or nodules palpated; no lymphadenopathy.  RESP  Clear  P & A; w/o, wheezes/ rales/ or rhonchi.no accessory muscle use, no dullness to percussion  CARD:  RRR, no m/r/g  , no peripheral edema, pulses intact  GI:   Soft & nt; nml bowel sounds; no organomegaly or masses detected.  Musco: Warm bil, no deformities or joint swelling noted.   Neuro: alert, no focal deficits noted.    Skin: Warm, no lesions or rashes         Assessment & Plan:

## 2014-06-21 NOTE — Assessment & Plan Note (Signed)
Doing well on study drug with no apparent complication Plan  Cont w/ research protocol

## 2014-06-21 NOTE — Progress Notes (Signed)
Spirometry pre and post done today. 

## 2014-06-21 NOTE — Progress Notes (Signed)
IMPACT VISIT 5:  Patient returns for visit 5 of the Impact study.  The patients weight is down since the last visit, she attributes this to the stress of caring for her terminally ill mother, but overall she states she has been feeling good.  Patient states she has not had any change in her current medications nor has she experienced any new or ongoing exacerbations since her last visit.  All study drug has been taken as instructed.  Has used very little of her rescue inhaler.  She is due to return in February for Visit 6.

## 2014-06-26 ENCOUNTER — Encounter (HOSPITAL_COMMUNITY): Payer: Self-pay

## 2014-06-28 ENCOUNTER — Encounter (HOSPITAL_COMMUNITY): Payer: Self-pay

## 2014-07-03 ENCOUNTER — Encounter (HOSPITAL_COMMUNITY): Payer: Self-pay

## 2014-07-05 ENCOUNTER — Encounter (HOSPITAL_COMMUNITY): Payer: Self-pay

## 2014-07-09 ENCOUNTER — Telehealth: Payer: Self-pay | Admitting: Internal Medicine

## 2014-07-09 NOTE — Telephone Encounter (Signed)
Will place form in MR's box.

## 2014-07-10 ENCOUNTER — Encounter (HOSPITAL_COMMUNITY): Payer: Self-pay

## 2014-07-12 ENCOUNTER — Encounter (HOSPITAL_COMMUNITY): Payer: Self-pay

## 2014-07-13 ENCOUNTER — Encounter: Payer: Self-pay | Admitting: Internal Medicine

## 2014-07-16 NOTE — Telephone Encounter (Signed)
Pt emailed via [employer] requesting update on [employer]. Informed pt via email that form has been placed in outgoing mail and to contact us if anything further is needed. Will sign off.

## 2014-07-16 NOTE — Telephone Encounter (Signed)
lmtcb for pt.  [employer] form placed in outgoing mail.

## 2014-07-17 ENCOUNTER — Encounter (HOSPITAL_COMMUNITY): Payer: Self-pay

## 2014-07-17 ENCOUNTER — Telehealth: Payer: Self-pay | Admitting: Internal Medicine

## 2014-07-17 NOTE — Telephone Encounter (Signed)
Spoke with pt and advised that Katie Greene was calling her to let her know she had put [employer] form in the mail to her.

## 2014-07-19 ENCOUNTER — Telehealth (HOSPITAL_COMMUNITY): Payer: Self-pay | Admitting: *Deleted

## 2014-07-19 ENCOUNTER — Encounter (HOSPITAL_COMMUNITY): Payer: Self-pay

## 2014-07-24 ENCOUNTER — Encounter (HOSPITAL_COMMUNITY): Payer: Self-pay

## 2014-07-26 ENCOUNTER — Encounter (HOSPITAL_COMMUNITY): Payer: Self-pay

## 2014-07-31 ENCOUNTER — Encounter (HOSPITAL_COMMUNITY): Payer: Self-pay

## 2014-08-16 ENCOUNTER — Encounter: Payer: Self-pay | Admitting: Gastroenterology

## 2014-08-23 ENCOUNTER — Telehealth: Payer: Self-pay | Admitting: Internal Medicine

## 2014-08-23 ENCOUNTER — Encounter: Payer: Self-pay | Admitting: Internal Medicine

## 2014-08-23 ENCOUNTER — Ambulatory Visit (INDEPENDENT_AMBULATORY_CARE_PROVIDER_SITE_OTHER): Payer: Commercial Managed Care - HMO | Admitting: Internal Medicine

## 2014-08-23 VITALS — BP 116/68 | HR 85 | Ht 67.0 in | Wt 180.0 lb

## 2014-08-23 DIAGNOSIS — J449 Chronic obstructive pulmonary disease, unspecified: Secondary | ICD-10-CM | POA: Diagnosis not present

## 2014-08-23 DIAGNOSIS — J9611 Chronic respiratory failure with hypoxia: Secondary | ICD-10-CM

## 2014-08-23 DIAGNOSIS — Z006 Encounter for examination for normal comparison and control in clinical research program: Secondary | ICD-10-CM | POA: Diagnosis not present

## 2014-08-23 NOTE — Telephone Encounter (Signed)
Called spoke with pt. She will await next OV to order CT. Nothing further needed

## 2014-08-23 NOTE — Telephone Encounter (Signed)
Pt seen today. Do not see where any follow up CT scan is mentioned she needed it. Please advise MR thanks

## 2014-08-23 NOTE — Telephone Encounter (Signed)
She only needs it past august - nov 2016. Which will be 2 years for fu of nodule. However, I decided to discuss this wither her next visit so I did not mention in note. IF she wants you can order it to coincide with next visit past aug 2016

## 2014-08-23 NOTE — Patient Instructions (Signed)
#  COPD -  - stable disease - continue o2 as before  - continue study drug on GSK Impact study  #Weight loss  - mild  And probably related to emotional stress  - will monitor  #FOllowup   -per research protocol but for routine clinical care visit - return in July 2016

## 2014-08-23 NOTE — Progress Notes (Signed)
Subjective:    Patient ID: Katie Greene, female    DOB: June 09, 1956, 59 y.o.   MRN: [id number]  HPI   #COPD - Gold stage 3. On home o2 since July 2012. MM genotype  PFT FVC fev1 ratio BD fev1 TLC DLCO comment  03/31/12 1.65L/55% 0.78L/32% 47/58% 11% 4.8L/88% 6.5/23%% Gold stage 3 copd with post bd fev1 0.86L/36%  06/21/14 - post BD value 1.69L/55% 0.78L//32% 46       - RX IMPACT COPD TRIAL SINCE SPRING 2015/May 2015 - [employer] Nov 2013 - Pulm rehab Early 2014   #AECOPD  - February 2014 outpatient treatment - April 2014 telephone treatment for acute bronchitis without prednisone - 07/28/2013: Telephone treatment with Levaquin and prednisone  #SMoking  - quit 2013  #Overweight  - Body mass index is 28.54 kg/(m^2).  -- Body mass index is 29.91 kg/(m^2).  On 06/16/2013 - Body mass index is 28.19 kg/(m^2). - 08/23/2014    #Lung cancer rrisk (age, smoking, strong family hx of several cancers) - CT 09/22/12 - emphysema with MPN of 64mms several -unchagned Aug 2015    OV 08/23/2014  Chief Complaint  Patient presents with  . Follow-up    Pt stated her breathing is overall unchanged. Pt c/o DOE, mild cough with little mucus production-white and light yellow. Pt denies CP/tightness.   COPD patient in research study GSK IMPACT research study: blinded >/= 52 week randomized trial of ANORO equivalent v BREO equivalent v GSK triple MDI (ICS + LABA + LAMA). HOWever this is a routine clinical care visit. This is NOT A research visit   Overall COPD stable. There are no issues. There has been no exacerbations. She's been on study drug since May 2015 and has been taking it diligently and compliantly. This no history of any exacerbations. Dyspnea stable. Cough is mild and stable. Both unchanged.  Past medical and social history Only issue has been mom is with cancer and is at home with home hospice. Patient is moved in with the mom. Mom is still functional. Expected life  expectancy 6 months. Mom poses emotional stress to the patient and therefore she has some diminished appetite and has lost a few pounds of weight. Otherwise is okay   Immunization History  Administered Date(s) Administered  . Influenza Split 06/10/2012, 04/03/2014  . Influenza,inj,Quad PF,36+ Mos 04/24/2013  . Pneumococcal Polysaccharide-23 06/10/2012  . Zoster 05/10/2014      Review of Systems  Constitutional: Negative for fever and unexpected weight change.  HENT: Negative for congestion, dental problem, ear pain, nosebleeds, postnasal drip, rhinorrhea, sinus pressure, sneezing, sore throat and trouble swallowing.   Eyes: Negative for redness and itching.  Respiratory: Positive for cough and shortness of breath. Negative for chest tightness and wheezing.   Cardiovascular: Negative for palpitations and leg swelling.  Gastrointestinal: Negative for nausea and vomiting.  Genitourinary: Negative for dysuria.  Musculoskeletal: Negative for joint swelling.  Skin: Negative for rash.  Neurological: Negative for headaches.  Hematological: Does not bruise/bleed easily.  Psychiatric/Behavioral: Negative for dysphoric mood. The patient is not nervous/anxious.     Current outpatient prescriptions:  .  Acetylcysteine 600 MG CAPS, Take 1 capsule by mouth 2 (two) times daily., Disp: , Rfl:  .  albuterol (VENTOLIN HFA) 108 (90 BASE) MCG/ACT inhaler, Inhale 2 puffs into the lungs every 6 (six) hours as needed., Disp: 1 Inhaler, Rfl: 2 .  aspirin 81 MG tablet, Take 81 mg by mouth daily.  , Disp: , Rfl:  .  diltiazem (CARDIZEM CD) 180 MG 24 hr capsule, Take 180 mg by mouth daily., Disp: , Rfl:  .  ferrous fumarate (HEMOCYTE - 106 MG FE) 325 (106 FE) MG TABS tablet, Take 1 tablet by mouth daily., Disp: , Rfl:  .  fluticasone (FLONASE) 50 MCG/ACT nasal spray, Place 2 sprays into both nostrils daily., Disp: 16 g, Rfl: 2 .  hydrochlorothiazide (HYDRODIURIL) 25 MG tablet, Take 25 mg by mouth daily.,  Disp: , Rfl:  .  meloxicam (MOBIC) 7.5 MG tablet, 1 tablet Daily., Disp: , Rfl:  .  pravastatin (PRAVACHOL) 40 MG tablet, Take 40 mg by mouth daily., Disp: , Rfl:  .  spironolactone (ALDACTONE) 50 MG tablet, Take 50 mg by mouth daily., Disp: , Rfl:  .  STUDY MEDICATION, Pt taking study drug, Disp: , Rfl:  .  temazepam (RESTORIL) 15 MG capsule, 1 tablet Daily., Disp: , Rfl:  .  TOPROL XL 50 MG 24 hr tablet, 1 tablet Daily., Disp: , Rfl:      Objective:   Physical Exam  Constitutional: She is oriented to person, place, and time. She appears well-developed and well-nourished. No distress.  Estimated body mass index is 28.19 kg/(m^2) as calculated from the following:   Height as of this encounter: 5\' 7"  (1.702 m).   Weight as of this encounter: 180 lb (81.647 kg).   HENT:  Head: Normocephalic and atraumatic.  Right Ear: External ear normal.  Left Ear: External ear normal.  Mouth/Throat: Oropharynx is clear and moist. No oropharyngeal exudate.  o2 on No thrush  Eyes: Conjunctivae and EOM are normal. Pupils are equal, round, and reactive to light. Right eye exhibits no discharge. Left eye exhibits no discharge. No scleral icterus.  Neck: Normal range of motion. Neck supple. No JVD present. No tracheal deviation present. No thyromegaly present.  Cardiovascular: Normal rate, regular rhythm, normal heart sounds and intact distal pulses.  Exam reveals no gallop and no friction rub.   No murmur heard. Pulmonary/Chest: Effort normal and breath sounds normal. No respiratory distress. She has no wheezes. She has no rales. She exhibits no tenderness.  Mild purse lip breathing - as before, no change Diminished air entry - as before, no change  Abdominal: Soft. Bowel sounds are normal. She exhibits no distension and no mass. There is no tenderness. There is no rebound and no guarding.  Musculoskeletal: Normal range of motion. She exhibits no edema or tenderness.  Lymphadenopathy:    She has no  cervical adenopathy.  Neurological: She is alert and oriented to person, place, and time. She has normal reflexes. No cranial nerve deficit. She exhibits normal muscle tone. Coordination normal.  Skin: Skin is warm and dry. No rash noted. She is not diaphoretic. No erythema. No pallor.  Psychiatric: She has a normal mood and affect. Her behavior is normal. Judgment and thought content normal.  Vitals reviewed.    Filed Vitals:   08/23/14 1036  BP: 116/68  Pulse: 85  Height: 5\' 7"  (1.702 m)  Weight: 180 lb (81.647 kg)  SpO2: 94%        Assessment & Plan:     ICD-9-CM ICD-10-CM   1. 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 Chronic respiratory failure with hypoxia 518.83 J96.11    799.02    3. Patient in clinical research study V70.7 Z00.6    #COPD -  - stable disease - continue o2 as before  - continue study drug on GSK Impact study  #Weight loss  -  mild  And probably related to emotional stress  - will monitor  #FOllowup   -per research protocol but for routine clinical care visit - return in July 2016   Dr. Brand Males, M.D., F.C.C.P Pulmonary and Critical Care Medicine Staff Physician [facility] Pager: [phone], If no answer or between  15:00h - 7:00h: call [phone]  08/23/2014 10:58 AM

## 2014-08-30 DIAGNOSIS — R21 Rash and other nonspecific skin eruption: Secondary | ICD-10-CM | POA: Diagnosis not present

## 2014-08-30 DIAGNOSIS — R531 Weakness: Secondary | ICD-10-CM | POA: Diagnosis not present

## 2014-08-30 DIAGNOSIS — F419 Anxiety disorder, unspecified: Secondary | ICD-10-CM | POA: Diagnosis not present

## 2014-09-10 ENCOUNTER — Other Ambulatory Visit: Payer: Self-pay | Admitting: Internal Medicine

## 2014-09-10 DIAGNOSIS — R0600 Dyspnea, unspecified: Secondary | ICD-10-CM

## 2014-09-11 ENCOUNTER — Ambulatory Visit (INDEPENDENT_AMBULATORY_CARE_PROVIDER_SITE_OTHER): Payer: Commercial Managed Care - HMO | Admitting: Adult Health

## 2014-09-11 ENCOUNTER — Ambulatory Visit (INDEPENDENT_AMBULATORY_CARE_PROVIDER_SITE_OTHER): Payer: Commercial Managed Care - HMO | Admitting: Internal Medicine

## 2014-09-11 ENCOUNTER — Encounter: Payer: Self-pay | Admitting: Adult Health

## 2014-09-11 VITALS — BP 126/78 | HR 91 | Temp 98.0°F | Resp 17 | Wt 178.0 lb

## 2014-09-11 DIAGNOSIS — Z006 Encounter for examination for normal comparison and control in clinical research program: Secondary | ICD-10-CM

## 2014-09-11 DIAGNOSIS — R0600 Dyspnea, unspecified: Secondary | ICD-10-CM

## 2014-09-11 LAB — PULMONARY FUNCTION TEST
FEF 25-75 POST: 0.31 L/s
FEF 25-75 PRE: 0.29 L/s
FEF2575-%Change-Post: 8 %
FEF2575-%PRED-POST: 13 %
FEF2575-%Pred-Pre: 12 %
FEV1-%Change-Post: 3 %
FEV1-%Pred-Post: 34 %
FEV1-%Pred-Pre: 33 %
FEV1-Post: 0.83 L
FEV1-Pre: 0.8 L
FEV1FVC-%CHANGE-POST: 2 %
FEV1FVC-%PRED-PRE: 57 %
FEV6-%Change-Post: 2 %
FEV6-%PRED-POST: 57 %
FEV6-%PRED-PRE: 55 %
FEV6-Post: 1.69 L
FEV6-Pre: 1.65 L
FEV6FVC-%Change-Post: 1 %
FEV6FVC-%Pred-Post: 97 %
FEV6FVC-%Pred-Pre: 96 %
FVC-%Change-Post: 0 %
FVC-%PRED-POST: 58 %
FVC-%Pred-Pre: 58 %
FVC-POST: 1.78 L
FVC-PRE: 1.76 L
POST FEV1/FVC RATIO: 47 %
Post FEV6/FVC ratio: 95 %
Pre FEV1/FVC ratio: 45 %
Pre FEV6/FVC Ratio: 93 %

## 2014-09-11 NOTE — Assessment & Plan Note (Signed)
Continue with study protocol  No oral candidiasis noted.

## 2014-09-11 NOTE — Progress Notes (Signed)
Impact Visit 6:  Subject returns for Visit 6, overall doing great but is dealing with the decline of her mom who is under the care of Hospice.  She is given another rescue inhaler today and she has returned all study drug.  Her final appointment will be the first week of May 2016 and she is not happy with having to leave the study.  Subject will be called in March to set appointment.  All study drugs have been taken as directed and is 100% compliance.

## 2014-09-11 NOTE — Progress Notes (Signed)
Spirometry pre and post done today. Research  

## 2014-09-11 NOTE — Progress Notes (Signed)
   Subjective:    Patient ID: Katie Greene, female    DOB: [birth year], 59 y.o.   MRN: [id number]  HPI   09/11/2014 Research Visit  GSK IMPACT research study: blinded >/= 52 week randomized trial of ANORO equivalent v BREO equivalent v GSK triple MDI (ICS + LABA + LAMA) This is visit 6. She is doing well. She is 100% compliant with study drug.    Review of Systems  See HPI         Objective:   Physical Exam GEN: A/Ox3; pleasant , NAD, well nourished  VSS reviewed   HEENT:   No thrush noted.  Assessment & Plan:

## 2014-09-13 ENCOUNTER — Ambulatory Visit: Payer: Self-pay | Admitting: Adult Health

## 2014-10-05 DIAGNOSIS — J449 Chronic obstructive pulmonary disease, unspecified: Secondary | ICD-10-CM | POA: Diagnosis not present

## 2014-10-12 DIAGNOSIS — J439 Emphysema, unspecified: Secondary | ICD-10-CM | POA: Diagnosis not present

## 2014-10-12 DIAGNOSIS — Z6827 Body mass index (BMI) 27.0-27.9, adult: Secondary | ICD-10-CM | POA: Diagnosis not present

## 2014-10-12 DIAGNOSIS — F329 Major depressive disorder, single episode, unspecified: Secondary | ICD-10-CM | POA: Diagnosis not present

## 2014-10-12 DIAGNOSIS — R0600 Dyspnea, unspecified: Secondary | ICD-10-CM | POA: Diagnosis not present

## 2014-10-12 DIAGNOSIS — I10 Essential (primary) hypertension: Secondary | ICD-10-CM | POA: Diagnosis not present

## 2014-11-05 DIAGNOSIS — J449 Chronic obstructive pulmonary disease, unspecified: Secondary | ICD-10-CM | POA: Diagnosis not present

## 2014-11-19 ENCOUNTER — Encounter: Payer: Self-pay | Admitting: Internal Medicine

## 2014-11-19 DIAGNOSIS — J449 Chronic obstructive pulmonary disease, unspecified: Secondary | ICD-10-CM

## 2014-11-19 NOTE — Telephone Encounter (Signed)
MR please advise if you are okay with making this order. Thanks.

## 2014-11-21 NOTE — Telephone Encounter (Signed)
Order has been placed.

## 2014-11-21 NOTE — Telephone Encounter (Signed)
Please refer to rehab on basis of severe copd

## 2014-11-22 ENCOUNTER — Telehealth: Payer: Self-pay | Admitting: Internal Medicine

## 2014-11-22 NOTE — Telephone Encounter (Signed)
See email 11/19/14 Pt aware that order placed.  Nothing further needed Randa Spike, CMA at 11/21/2014 3:14 PM     Status: Signed       Expand All Collapse All   Order has been placed.            Brand Males, MD at 11/21/2014 2:32 PM     Status: Signed       Expand All Collapse All   Please refer to rehab on basis of severe copd

## 2014-11-22 NOTE — Telephone Encounter (Signed)
Ok for pulm rehab 

## 2014-12-05 DIAGNOSIS — J449 Chronic obstructive pulmonary disease, unspecified: Secondary | ICD-10-CM | POA: Diagnosis not present

## 2014-12-11 ENCOUNTER — Other Ambulatory Visit: Payer: Self-pay | Admitting: Internal Medicine

## 2014-12-11 DIAGNOSIS — R0600 Dyspnea, unspecified: Secondary | ICD-10-CM

## 2014-12-12 ENCOUNTER — Ambulatory Visit (INDEPENDENT_AMBULATORY_CARE_PROVIDER_SITE_OTHER): Payer: Commercial Managed Care - HMO | Admitting: Internal Medicine

## 2014-12-12 ENCOUNTER — Telehealth: Payer: Self-pay | Admitting: Internal Medicine

## 2014-12-12 VITALS — BP 170/78 | HR 116 | Temp 98.7°F | Ht 67.0 in | Wt 179.0 lb

## 2014-12-12 DIAGNOSIS — J449 Chronic obstructive pulmonary disease, unspecified: Secondary | ICD-10-CM

## 2014-12-12 DIAGNOSIS — R0600 Dyspnea, unspecified: Secondary | ICD-10-CM

## 2014-12-12 DIAGNOSIS — R0689 Other abnormalities of breathing: Secondary | ICD-10-CM

## 2014-12-12 DIAGNOSIS — Z006 Encounter for examination for normal comparison and control in clinical research program: Secondary | ICD-10-CM

## 2014-12-12 LAB — PULMONARY FUNCTION TEST
FEF 25-75 PRE: 0.22 L/s
FEF 25-75 Post: 0.42 L/sec
FEF2575-%CHANGE-POST: 89 %
FEF2575-%Pred-Post: 18 %
FEF2575-%Pred-Pre: 9 %
FEV1-%CHANGE-POST: 26 %
FEV1-%PRED-PRE: 28 %
FEV1-%Pred-Post: 36 %
FEV1-Post: 0.86 L
FEV1-Pre: 0.68 L
FEV1FVC-%Change-Post: 7 %
FEV1FVC-%Pred-Pre: 56 %
FEV6-%Change-Post: 21 %
FEV6-%PRED-POST: 59 %
FEV6-%PRED-PRE: 48 %
FEV6-POST: 1.72 L
FEV6-PRE: 1.42 L
FEV6FVC-%Change-Post: 3 %
FEV6FVC-%PRED-POST: 98 %
FEV6FVC-%PRED-PRE: 95 %
FVC-%Change-Post: 17 %
FVC-%PRED-PRE: 50 %
FVC-%Pred-Post: 59 %
FVC-Post: 1.8 L
FVC-Pre: 1.53 L
POST FEV6/FVC RATIO: 96 %
Post FEV1/FVC ratio: 48 %
Pre FEV1/FVC ratio: 44 %
Pre FEV6/FVC Ratio: 93 %

## 2014-12-12 MED ORDER — BUDESONIDE-FORMOTEROL FUMARATE 160-4.5 MCG/ACT IN AERO: 2.0000 | INHALATION_SPRAY | Freq: Two times a day (BID) | RESPIRATORY_TRACT | Status: DC

## 2014-12-12 MED ORDER — TIOTROPIUM BROMIDE MONOHYDRATE 18 MCG IN CAPS: 18.0000 ug | ORAL_CAPSULE | Freq: Every day | RESPIRATORY_TRACT | Status: DC

## 2014-12-12 MED ORDER — FLUTICASONE PROPIONATE 50 MCG/ACT NA SUSP: 2.0000 | Freq: Every day | NASAL | Status: DC

## 2014-12-12 NOTE — Addendum Note (Signed)
Addended by: Maurice March on: 12/12/2014 10:57 AM   Modules accepted: Orders

## 2014-12-12 NOTE — Telephone Encounter (Signed)
Spoke with pt, needs fluticasone nasal spray refilled to [facility].  This has been sent.  Nothing further needed.

## 2014-12-12 NOTE — Progress Notes (Signed)
[id number]: GSK IMPACT research study: blinded >/= 52 week randomized trial of ANORO equivalent v BREO equivalent v GSK triple MDI (ICS + LABA + LAMA). Sponsored by [place]   This visit 12/12/2014 for Katie Greene with Katie Greene who is subject number [id number]  is a research Visit and is for purpose of end of treatment and is number 7  on PROTOCOL. All study specific requirements are completed including AE updates, subject will return in 1 week for end of study visit.    SUBJECTIVE  Overall well. The overarching issue is that her mother died in [place] 2014-11-03. She is very very close to her mother. Since then she's been depressed. On 10/12/2058 and she was started on antidepressant which is only helped somewhat. Her weight is stable but she is physically more deconditioned because of her depression and as a result is complaining of progressive dyspnea on exertion which is moderate in severity. She used to be able to do groceries but now is needing help. She has not done any physical exercise since her mother got ill and died.  She has done all the questionnaires for the research visit  OBJECTIVE  Filed Vitals:   12/12/14 0934  BP: 170/78  Pulse: 116  Temp: 98.7 F (37.1 C)  TempSrc: Oral  Height: 5\' 7"  (1.702 m)  Weight: 179 lb (81.194 kg)  SpO2: 93%   Estimated body mass index is 28.03 kg/(m^2) as calculated from the following:   Height as of this encounter: 5\' 7"  (1.702 m).   Weight as of this encounter: 179 lb (81.194 kg).   Exam done and documented in the research shadow chart  - Stable ., New finding is flat affect and more physically deconditioned following mother's death   Pulmonary function test done today 12/12/2014: Prebronchodilator FEV1 is 0.77 L/32%, ratio 45. Post bronchodilator FEV1 is 0.8 L/34% with a ratio 46. These numbers appear unchanged compared to November 2015 and February 2016  Lab work in December 2015 was normal including CBC, chemistry and LFT. She will have her lab  work today  EKG pending today   ASSESSMENT/PLAN  #COPD research  - stable clinically - Last day of study drug today - Tomorrow go back to pre-study drug off Spiriva and Symbicort which we will prescribe today - Lab work and EKG today   #Increased dyspnea last few months without evidence of COPD exacerbation- this will be an adverse event for research  - Result of increased physical deconditioning that is secondary to depression - She will start rehabilitation for this pulmonary  #Depression - this will be a adverse event for research  - Diagnosed 2014-11-03 after mother's death and related to mother's death. Started on antidepressant by primary care physician 10/12/2014 according to her history - Per her primary care physician  #Sinus tachycardia -  - Continues unchanged - ongoing adverse event for research - We'll monitor   #weight Loss  - Weight is 179 pounds. This is stable compared to 3 months ago.Not related to study drug. REsolved adverse event   Dr. Brand Males, M.D., [facility]: Lambert Rhonda W.C.P Pulmonary and Critical Care Medicine Staff Physician [place] [facility] Pager: [phone], If no answer or between  15:00h - 7:00h: call [phone]  12/12/2014 9:52 AM

## 2014-12-12 NOTE — Progress Notes (Signed)
Spirometry pre and post done today. 

## 2014-12-12 NOTE — Patient Instructions (Addendum)
ICD-9-CM ICD-10-CM   1. Research study patient V70.7 Z00.6   2. COPD, severe 496 J44.9   3. Dyspnea and respiratory abnormality 786.09 R06.00     R06.89    #COPD stable clinically - Last day of study drug today - Tomorrow go back to pre-study drug off Spiriva and Symbicort which we will prescribe today - Lab work and EKG today     #Increased dyspnea last few months without evidence of COPD exacerbation  - Result of increased physical deconditioning that is secondary to depression - start pulmonary rehabilitation [already referred]  #Follow-up  - One week for research safety visit - 1 month for regular scheduled standard of care nonresearch follow-up with Dr. Chase Caller or Patricia Nettle

## 2014-12-18 ENCOUNTER — Encounter (HOSPITAL_COMMUNITY)
Admission: RE | Admit: 2014-12-18 | Discharge: 2014-12-18 | Disposition: A | Discharge status: Home / Self Care | Payer: Self-pay | Source: Ambulatory Visit | Attending: Internal Medicine | Admitting: Internal Medicine

## 2014-12-18 DIAGNOSIS — J42 Unspecified chronic bronchitis: Secondary | ICD-10-CM | POA: Insufficient documentation

## 2014-12-18 NOTE — Progress Notes (Signed)
Katie Greene completed a Six-Minute Walk Test on 12/18/14 . [name] walked 946 feet with 0 breaks.  The patient's lowest oxygen saturation was 89 %, highest heart rate was 112 bpm , and highest blood pressure was 110/80. The patient was on 3 liters of oxygen with a nasal cannula pulsed. Patient stated that nothing hindered their walk test.

## 2014-12-19 ENCOUNTER — Ambulatory Visit (INDEPENDENT_AMBULATORY_CARE_PROVIDER_SITE_OTHER): Payer: Commercial Managed Care - HMO | Admitting: Adult Health

## 2014-12-19 ENCOUNTER — Encounter: Payer: Self-pay | Admitting: Adult Health

## 2014-12-19 VITALS — BP 130/70 | HR 117 | Temp 98.8°F | Ht 67.0 in | Wt 183.0 lb

## 2014-12-19 DIAGNOSIS — Z006 Encounter for examination for normal comparison and control in clinical research program: Secondary | ICD-10-CM

## 2014-12-19 DIAGNOSIS — J441 Chronic obstructive pulmonary disease with (acute) exacerbation: Secondary | ICD-10-CM

## 2014-12-19 MED ORDER — PREDNISONE 10 MG PO TABS: ORAL_TABLET | ORAL | Status: DC

## 2014-12-19 MED ORDER — AZITHROMYCIN 250 MG PO TABS: ORAL_TABLET | ORAL | Status: AC

## 2014-12-19 NOTE — Progress Notes (Signed)
[id number]: GSK IMPACT research study: blinded >/= 52 week randomized trial of ANORO equivalent v BREO equivalent v GSK triple MDI (ICS + LABA + LAMA). Sponsored by [place]   This visit 12/19/2014 for Katie Greene with 1956/04/23 who is subject number [id number] is a research Visit and is for purpose of follow up and is visit number 8 on PROTOCOL.    Subject looks great.  Is feeling much better on antidepressants since her mom's passing in Feb 2016.  She is interested in future COPD studies and would like [place] to contact her should any become available.

## 2014-12-19 NOTE — Patient Instructions (Signed)
Zpack take as directed  Prednisone taper over next week  Mucinex DM Twice daily  As needed  Cough/congestion  Follow up Dr. Chase Caller in 1 month and As needed

## 2014-12-20 ENCOUNTER — Encounter (HOSPITAL_COMMUNITY)
Admission: RE | Admit: 2014-12-20 | Discharge: 2014-12-20 | Disposition: A | Discharge status: Home / Self Care | Payer: Self-pay | Source: Ambulatory Visit | Attending: Internal Medicine | Admitting: Internal Medicine

## 2014-12-20 NOTE — Assessment & Plan Note (Signed)
Mild flare of COPD in study patient  tx w/ abx and steroids  Cont w/ protocol

## 2014-12-20 NOTE — Progress Notes (Signed)
   Subjective:    Patient ID: Katie Greene, female    DOB: 04/17/1956, 59 y.o.   MRN: [id number]  HPI [id number]: [employer] IMPACT research study: blinded >/= 52 week randomized trial of ANORO equivalent v BREO equivalent v GSK triple MDI (ICS + LABA + LAMA). Sponsored by [place]   This visit 12/19/2014 for Katie Greene with 1956-04-12 who is subject number [id number] is a research Visit and is for purpose of follow up and is visit number 8 on PROTOCOL.   Complains over last with with productive cough and wheezing . Coughing thick white/green mucus .  Wheezing is intermittent. No chest pain, orthopnea, edema or fever.    Review of Systems Constitutional:   No  weight loss, night sweats,  Fevers, chills,  +fatigue, or  lassitude.  HEENT:   No headaches,  Difficulty swallowing,  Tooth/dental problems, or  Sore throat,                No sneezing, itching, ear ache,  +nasal congestion, post nasal drip,   CV:  No chest pain,  Orthopnea, PND, swelling in lower extremities, anasarca, dizziness, palpitations, syncope.   GI  No heartburn, indigestion, abdominal pain, nausea, vomiting, diarrhea, change in bowel habits, loss of appetite, bloody stools.   Resp:    No chest wall deformity  Skin: no rash or lesions.  GU: no dysuria, change in color of urine, no urgency or frequency.  No flank pain, no hematuria   MS:  No joint pain or swelling.  No decreased range of motion.  No back pain.  Psych:  No change in mood or affect. No depression or anxiety.  No memory loss.         Objective:   Physical Exam GEN: A/Ox3; pleasant , NAD,   HEENT:  [place]/AT,  EACs-clear, TMs-wnl, NOSE-clear, THROAT-clear, no lesions, no postnasal drip or exudate noted.   NECK:  Supple w/ fair ROM; no JVD; normal carotid impulses w/o bruits; no thyromegaly or nodules palpated; no lymphadenopathy.  RESP  Clear  P & A; w/o, wheezes/ rales/ or rhonchi.no accessory muscle use, no dullness to percussion  CARD:  RRR, no m/r/g   , no peripheral edema, pulses intact, no cyanosis or clubbing.  GI:   Soft & nt; nml bowel sounds; no organomegaly or masses detected.  Musco: Warm bil, no deformities or joint swelling noted.   Neuro: alert, no focal deficits noted.    Skin: Warm, no lesions or rashes        Assessment & Plan:

## 2014-12-20 NOTE — Assessment & Plan Note (Signed)
Mild flare   Plan  zpack take as directed  Prednisone taper over next week  Mucinex DM Twice daily  As needed  Cough/congestion  Follow up Dr. Chase Caller in 1 month and As needed

## 2014-12-25 ENCOUNTER — Encounter (HOSPITAL_COMMUNITY)
Admission: RE | Admit: 2014-12-25 | Discharge: 2014-12-25 | Disposition: A | Discharge status: Home / Self Care | Payer: Self-pay | Source: Ambulatory Visit | Attending: Internal Medicine | Admitting: Internal Medicine

## 2014-12-27 ENCOUNTER — Encounter (HOSPITAL_COMMUNITY): Payer: Self-pay

## 2014-12-27 DIAGNOSIS — Z6828 Body mass index (BMI) 28.0-28.9, adult: Secondary | ICD-10-CM | POA: Diagnosis not present

## 2014-12-27 DIAGNOSIS — F329 Major depressive disorder, single episode, unspecified: Secondary | ICD-10-CM | POA: Diagnosis not present

## 2014-12-27 DIAGNOSIS — J439 Emphysema, unspecified: Secondary | ICD-10-CM | POA: Diagnosis not present

## 2014-12-27 DIAGNOSIS — F5104 Psychophysiologic insomnia: Secondary | ICD-10-CM | POA: Diagnosis not present

## 2015-01-01 ENCOUNTER — Encounter (HOSPITAL_COMMUNITY): Payer: Self-pay

## 2015-01-03 ENCOUNTER — Encounter (HOSPITAL_COMMUNITY)
Admission: RE | Admit: 2015-01-03 | Discharge: 2015-01-03 | Disposition: A | Discharge status: Home / Self Care | Payer: Self-pay | Source: Ambulatory Visit | Attending: Internal Medicine | Admitting: Internal Medicine

## 2015-01-03 DIAGNOSIS — J42 Unspecified chronic bronchitis: Secondary | ICD-10-CM | POA: Insufficient documentation

## 2015-01-05 DIAGNOSIS — J449 Chronic obstructive pulmonary disease, unspecified: Secondary | ICD-10-CM | POA: Diagnosis not present

## 2015-01-08 ENCOUNTER — Encounter (HOSPITAL_COMMUNITY): Payer: Self-pay

## 2015-01-10 ENCOUNTER — Encounter (HOSPITAL_COMMUNITY): Payer: Self-pay

## 2015-01-15 ENCOUNTER — Encounter (HOSPITAL_COMMUNITY)
Admission: RE | Admit: 2015-01-15 | Discharge: 2015-01-15 | Disposition: A | Discharge status: Home / Self Care | Payer: Self-pay | Source: Ambulatory Visit | Attending: Internal Medicine | Admitting: Internal Medicine

## 2015-01-17 ENCOUNTER — Encounter (HOSPITAL_COMMUNITY): Payer: Self-pay

## 2015-01-22 ENCOUNTER — Encounter (HOSPITAL_COMMUNITY): Admission: RE | Admit: 2015-01-22 | Payer: Self-pay | Source: Ambulatory Visit

## 2015-01-24 ENCOUNTER — Encounter (HOSPITAL_COMMUNITY): Payer: Self-pay

## 2015-01-29 ENCOUNTER — Encounter (HOSPITAL_COMMUNITY)
Admission: RE | Admit: 2015-01-29 | Discharge: 2015-01-29 | Disposition: A | Discharge status: Home / Self Care | Payer: Self-pay | Source: Ambulatory Visit | Attending: Internal Medicine | Admitting: Internal Medicine

## 2015-01-29 NOTE — Progress Notes (Signed)
Pt HR-134 upon arrival to pulmonary rehab today. Pt asymptomatic other than dull headache. Pt reports severe situational family stress at home and inability to rest well. BP- 120/70,  O2sat-94% 3L via nasal cannula.  Rhythm strip reveals normal sinus rhythm.  Repeat HR check 122 after sitting.  Pt requests to exercise as she feels it will reduce her stress. Pt denies  caffeine use, adequate PO fluid and missed medications. Pt reports using ensure to supplement poor PO food intake.    Pt participated in light activity without difficulty.  Check out HR-121.    Will continue to monitor.

## 2015-01-31 ENCOUNTER — Encounter (HOSPITAL_COMMUNITY): Payer: Self-pay

## 2015-02-04 DIAGNOSIS — J449 Chronic obstructive pulmonary disease, unspecified: Secondary | ICD-10-CM | POA: Diagnosis not present

## 2015-02-05 ENCOUNTER — Ambulatory Visit (INDEPENDENT_AMBULATORY_CARE_PROVIDER_SITE_OTHER): Payer: Commercial Managed Care - HMO | Admitting: Internal Medicine

## 2015-02-05 ENCOUNTER — Encounter: Payer: Self-pay | Admitting: Internal Medicine

## 2015-02-05 ENCOUNTER — Encounter (HOSPITAL_COMMUNITY)
Admission: RE | Admit: 2015-02-05 | Discharge: 2015-02-05 | Disposition: A | Discharge status: Home / Self Care | Payer: Self-pay | Source: Ambulatory Visit | Attending: Internal Medicine | Admitting: Internal Medicine

## 2015-02-05 VITALS — BP 122/68 | HR 115 | Ht 67.0 in | Wt 186.0 lb

## 2015-02-05 DIAGNOSIS — J42 Unspecified chronic bronchitis: Secondary | ICD-10-CM | POA: Insufficient documentation

## 2015-02-05 DIAGNOSIS — J449 Chronic obstructive pulmonary disease, unspecified: Secondary | ICD-10-CM | POA: Diagnosis not present

## 2015-02-05 DIAGNOSIS — Z129 Encounter for screening for malignant neoplasm, site unspecified: Secondary | ICD-10-CM

## 2015-02-05 NOTE — Patient Instructions (Addendum)
ICD-9-CM ICD-10-CM   1. 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 Cancer screening V76.9 Z12.9    COPD  - stable  - continue o2 and mdi as before  Cancer screen for lung  - we discussed this ; meet with Eric Form NP about this   #Followup  3 months or sooner if needed

## 2015-02-05 NOTE — Progress Notes (Signed)
Subjective:    Patient ID: Katie Greene, female    DOB: 08-14-1955, 59 y.o.   MRN: [id number]  HPI   OV 02/05/2015  Chief Complaint  Patient presents with  . Follow-up    Pt here after last visit in study. Pt saw TP last with COPD exacerbation. Pt stated she is not yet at baseline. Pt c/o DOE, mild prod cough with white and light yellow mucus. Pt denies CP/tightness.    Follow-up Gold stage severe COPD with oxygen use  She is no longer research patient. She is now on standard therapy with Symbicort and Spiriva and oxygen. She had an exacerbation May 2016. She is on an acetylcysteine to prevent exacerbations. Since exacerbation she has slowly limped to baseline. Or near baseline. She does have some cough with mucus but she says that she feels more deconditioned because of depression that she suffered from when her mom died and also her sister has been diagnosed with stage IV colon cancer recently.  Of note August 2015 was her last CT scan of the chest which she had a micronodules. She is interested in being on top of potential lung cancer screening   Current outpatient prescriptions:  .  Acetylcysteine 600 MG CAPS, Take 1 capsule by mouth 2 (two) times daily., Disp: , Rfl:  .  albuterol (VENTOLIN HFA) 108 (90 BASE) MCG/ACT inhaler, Inhale 2 puffs into the lungs every 6 (six) hours as needed., Disp: 1 Inhaler, Rfl: 2 .  aspirin 81 MG tablet, Take 81 mg by mouth daily.  , Disp: , Rfl:  .  budesonide-formoterol (SYMBICORT) 160-4.5 MCG/ACT inhaler, Inhale 2 puffs into the lungs 2 (two) times daily., Disp: 1 Inhaler, Rfl: 6 .  diltiazem (CARDIZEM CD) 180 MG 24 hr capsule, Take 180 mg by mouth daily., Disp: , Rfl:  .  ferrous fumarate (HEMOCYTE - 106 MG FE) 325 (106 FE) MG TABS tablet, Take 1 tablet by mouth daily., Disp: , Rfl:  .  fluticasone (FLONASE) 50 MCG/ACT nasal spray, Place 2 sprays into both nostrils daily., Disp: 16 g, Rfl: 5 .  hydrochlorothiazide (HYDRODIURIL) 25 MG tablet,  Take 25 mg by mouth daily., Disp: , Rfl:  .  meloxicam (MOBIC) 7.5 MG tablet, 1 tablet Daily., Disp: , Rfl:  .  pravastatin (PRAVACHOL) 40 MG tablet, Take 40 mg by mouth daily., Disp: , Rfl:  .  spironolactone (ALDACTONE) 50 MG tablet, Take 50 mg by mouth daily., Disp: , Rfl:  .  temazepam (RESTORIL) 15 MG capsule, 1 tablet Daily., Disp: , Rfl:  .  tiotropium (SPIRIVA) 18 MCG inhalation capsule, Place 1 capsule (18 mcg total) into inhaler and inhale daily., Disp: 30 capsule, Rfl: 6 .  TOPROL XL 50 MG 24 hr tablet, 1 tablet Daily., Disp: , Rfl:     Review of Systems  Constitutional: Negative for fever and unexpected weight change.  HENT: Negative for congestion, dental problem, ear pain, nosebleeds, postnasal drip, rhinorrhea, sinus pressure, sneezing, sore throat and trouble swallowing.   Eyes: Negative for redness and itching.  Respiratory: Positive for cough and shortness of breath. Negative for chest tightness and wheezing.   Cardiovascular: Negative for palpitations and leg swelling.  Gastrointestinal: Negative for nausea and vomiting.  Genitourinary: Negative for dysuria.  Musculoskeletal: Negative for joint swelling.  Skin: Negative for rash.  Neurological: Negative for headaches.  Hematological: Does not bruise/bleed easily.  Psychiatric/Behavioral: Negative for dysphoric mood. The patient is not nervous/anxious.        Objective:  Physical Exam Filed Vitals:   02/05/15 1634  BP: 122/68  Pulse: 115  Height: 5\' 7"  (1.702 m)  Weight: 186 lb (84.369 kg)  SpO2: 96%   EN: A/Ox3; pleasant , NAD,   HEENT:  [place]/AT,  EACs-clear, TMs-wnl, NOSE-clear, THROAT-clear, no lesions, no postnasal drip or exudate noted.   NECK:  Supple w/ fair ROM; no JVD; normal carotid impulses w/o bruits; no thyromegaly or nodules palpated; no lymphadenopathy.  RESP  Clear  P & A; w/o, wheezes/ rales/ or rhonchi.no accessory muscle use, no dullness to percussion  CARD:  RRR, no m/r/g  , no  peripheral edema, pulses intact, no cyanosis or clubbing.  GI:   Soft & nt; nml bowel sounds; no organomegaly or masses detected.  Musco: Warm bil, no deformities or joint swelling noted.   Neuro: alert, no focal deficits noted.    Skin: Warm, no lesions or rashes        Assessment & Plan:     ICD-9-CM ICD-10-CM   1. 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 Cancer screening V76.9 Z12.9     COPD  - stable  - continue o2 and mdi as before  Cancer screen for lung  - we discussed this ; meet with Eric Form NP about this - already done   #Followup  3 months or sooner if needed    Dr. Brand Males, M.D., [facility].C.P Pulmonary and Critical Care Medicine Staff Physician [place] [facility] Pager: [phone], If no answer or between  15:00h - 7:00h: call [phone]  02/05/2015 5:15 PM

## 2015-02-07 ENCOUNTER — Encounter (HOSPITAL_COMMUNITY): Payer: Self-pay

## 2015-02-12 ENCOUNTER — Encounter (HOSPITAL_COMMUNITY)
Admission: RE | Admit: 2015-02-12 | Discharge: 2015-02-12 | Disposition: A | Discharge status: Home / Self Care | Payer: Self-pay | Source: Ambulatory Visit | Attending: Internal Medicine | Admitting: Internal Medicine

## 2015-02-14 ENCOUNTER — Other Ambulatory Visit: Payer: Self-pay | Admitting: Emergency Medicine

## 2015-02-14 ENCOUNTER — Encounter (HOSPITAL_COMMUNITY)
Admission: RE | Admit: 2015-02-14 | Discharge: 2015-02-14 | Disposition: A | Discharge status: Home / Self Care | Payer: Self-pay | Source: Ambulatory Visit | Attending: Internal Medicine | Admitting: Internal Medicine

## 2015-02-14 MED ORDER — ALBUTEROL SULFATE HFA 108 (90 BASE) MCG/ACT IN AERS: 2.0000 | INHALATION_SPRAY | Freq: Four times a day (QID) | RESPIRATORY_TRACT | Status: DC | PRN

## 2015-02-19 ENCOUNTER — Encounter (HOSPITAL_COMMUNITY)
Admission: RE | Admit: 2015-02-19 | Discharge: 2015-02-19 | Disposition: A | Discharge status: Home / Self Care | Payer: Self-pay | Source: Ambulatory Visit | Attending: Internal Medicine | Admitting: Internal Medicine

## 2015-02-21 ENCOUNTER — Encounter (HOSPITAL_COMMUNITY): Payer: Self-pay

## 2015-02-23 ENCOUNTER — Encounter: Payer: Self-pay | Admitting: Internal Medicine

## 2015-02-25 ENCOUNTER — Telehealth: Payer: Self-pay | Admitting: Internal Medicine

## 2015-02-25 DIAGNOSIS — Z1231 Encounter for screening mammogram for malignant neoplasm of breast: Secondary | ICD-10-CM | POA: Diagnosis not present

## 2015-02-25 NOTE — Telephone Encounter (Signed)
MR please advise. Thanks! 

## 2015-02-25 NOTE — Telephone Encounter (Signed)
Find out for her or have her find out if incruse or tudorza is cheaper instead of spiriva

## 2015-02-26 ENCOUNTER — Encounter (HOSPITAL_COMMUNITY): Payer: Self-pay

## 2015-02-26 NOTE — Telephone Encounter (Signed)
lmtcb x1 

## 2015-02-26 NOTE — Telephone Encounter (Signed)
LMTCB

## 2015-02-26 NOTE — Telephone Encounter (Signed)
Pt returning call and can be reached @ [phone].Katie Greene

## 2015-02-27 NOTE — Telephone Encounter (Signed)
Patient is returning the call, please call her at [phone].

## 2015-02-27 NOTE — Telephone Encounter (Signed)
Spoke with pt and advised of MR recommendations.  Pt states she will call [employer] to check pricing and let us know if one of them will be cheaper.

## 2015-02-28 ENCOUNTER — Encounter (HOSPITAL_COMMUNITY): Payer: Self-pay

## 2015-03-05 ENCOUNTER — Encounter (HOSPITAL_COMMUNITY)
Admission: RE | Admit: 2015-03-05 | Discharge: 2015-03-05 | Disposition: A | Discharge status: Home / Self Care | Payer: Commercial Managed Care - HMO | Source: Ambulatory Visit | Attending: Internal Medicine | Admitting: Internal Medicine

## 2015-03-05 DIAGNOSIS — J42 Unspecified chronic bronchitis: Secondary | ICD-10-CM | POA: Diagnosis not present

## 2015-03-07 ENCOUNTER — Encounter (HOSPITAL_COMMUNITY): Payer: Commercial Managed Care - HMO

## 2015-03-07 DIAGNOSIS — J449 Chronic obstructive pulmonary disease, unspecified: Secondary | ICD-10-CM | POA: Diagnosis not present

## 2015-03-12 ENCOUNTER — Encounter (HOSPITAL_COMMUNITY)
Admission: RE | Admit: 2015-03-12 | Discharge: 2015-03-12 | Disposition: A | Discharge status: Home / Self Care | Payer: Commercial Managed Care - HMO | Source: Ambulatory Visit | Attending: Internal Medicine | Admitting: Internal Medicine

## 2015-03-14 ENCOUNTER — Encounter (HOSPITAL_COMMUNITY)
Admission: RE | Admit: 2015-03-14 | Discharge: 2015-03-14 | Disposition: A | Discharge status: Home / Self Care | Payer: Commercial Managed Care - HMO | Source: Ambulatory Visit | Attending: Internal Medicine | Admitting: Internal Medicine

## 2015-03-18 DIAGNOSIS — E785 Hyperlipidemia, unspecified: Secondary | ICD-10-CM | POA: Diagnosis not present

## 2015-03-18 DIAGNOSIS — R8299 Other abnormal findings in urine: Secondary | ICD-10-CM | POA: Diagnosis not present

## 2015-03-18 DIAGNOSIS — I10 Essential (primary) hypertension: Secondary | ICD-10-CM | POA: Diagnosis not present

## 2015-03-18 DIAGNOSIS — N390 Urinary tract infection, site not specified: Secondary | ICD-10-CM | POA: Diagnosis not present

## 2015-03-19 ENCOUNTER — Encounter (HOSPITAL_COMMUNITY)
Admission: RE | Admit: 2015-03-19 | Discharge: 2015-03-19 | Disposition: A | Discharge status: Home / Self Care | Payer: Commercial Managed Care - HMO | Source: Ambulatory Visit | Attending: Internal Medicine | Admitting: Internal Medicine

## 2015-03-20 DIAGNOSIS — Z1212 Encounter for screening for malignant neoplasm of rectum: Secondary | ICD-10-CM | POA: Diagnosis not present

## 2015-03-21 ENCOUNTER — Encounter (HOSPITAL_COMMUNITY): Payer: Commercial Managed Care - HMO

## 2015-03-25 DIAGNOSIS — Z9981 Dependence on supplemental oxygen: Secondary | ICD-10-CM | POA: Diagnosis not present

## 2015-03-25 DIAGNOSIS — F329 Major depressive disorder, single episode, unspecified: Secondary | ICD-10-CM | POA: Diagnosis not present

## 2015-03-25 DIAGNOSIS — Z0000 Encounter for general adult medical examination without abnormal findings: Secondary | ICD-10-CM | POA: Diagnosis not present

## 2015-03-25 DIAGNOSIS — M545 Low back pain: Secondary | ICD-10-CM | POA: Diagnosis not present

## 2015-03-25 DIAGNOSIS — E785 Hyperlipidemia, unspecified: Secondary | ICD-10-CM | POA: Diagnosis not present

## 2015-03-25 DIAGNOSIS — Z1389 Encounter for screening for other disorder: Secondary | ICD-10-CM | POA: Diagnosis not present

## 2015-03-25 DIAGNOSIS — I10 Essential (primary) hypertension: Secondary | ICD-10-CM | POA: Diagnosis not present

## 2015-03-25 DIAGNOSIS — J439 Emphysema, unspecified: Secondary | ICD-10-CM | POA: Diagnosis not present

## 2015-03-26 ENCOUNTER — Encounter (HOSPITAL_COMMUNITY)
Admission: RE | Admit: 2015-03-26 | Discharge: 2015-03-26 | Disposition: A | Discharge status: Home / Self Care | Payer: Commercial Managed Care - HMO | Source: Ambulatory Visit | Attending: Internal Medicine | Admitting: Internal Medicine

## 2015-03-26 ENCOUNTER — Encounter: Payer: Self-pay | Admitting: Gastroenterology

## 2015-03-28 ENCOUNTER — Encounter (HOSPITAL_COMMUNITY): Payer: Commercial Managed Care - HMO

## 2015-04-02 ENCOUNTER — Encounter (HOSPITAL_COMMUNITY): Payer: Commercial Managed Care - HMO

## 2015-04-04 ENCOUNTER — Encounter (HOSPITAL_COMMUNITY): Payer: Commercial Managed Care - HMO

## 2015-04-04 DIAGNOSIS — J42 Unspecified chronic bronchitis: Secondary | ICD-10-CM | POA: Diagnosis not present

## 2015-04-07 DIAGNOSIS — J449 Chronic obstructive pulmonary disease, unspecified: Secondary | ICD-10-CM | POA: Diagnosis not present

## 2015-04-09 ENCOUNTER — Encounter (HOSPITAL_COMMUNITY)
Admission: RE | Admit: 2015-04-09 | Discharge: 2015-04-09 | Disposition: A | Discharge status: Home / Self Care | Payer: Commercial Managed Care - HMO | Source: Ambulatory Visit | Attending: Internal Medicine | Admitting: Internal Medicine

## 2015-04-11 ENCOUNTER — Encounter (HOSPITAL_COMMUNITY): Payer: Commercial Managed Care - HMO

## 2015-04-15 DIAGNOSIS — Z0100 Encounter for examination of eyes and vision without abnormal findings: Secondary | ICD-10-CM | POA: Diagnosis not present

## 2015-04-15 DIAGNOSIS — H5210 Myopia, unspecified eye: Secondary | ICD-10-CM | POA: Diagnosis not present

## 2015-04-15 DIAGNOSIS — H524 Presbyopia: Secondary | ICD-10-CM | POA: Diagnosis not present

## 2015-04-16 ENCOUNTER — Encounter (HOSPITAL_COMMUNITY): Payer: Commercial Managed Care - HMO

## 2015-04-18 ENCOUNTER — Encounter (HOSPITAL_COMMUNITY): Payer: Commercial Managed Care - HMO

## 2015-04-20 DIAGNOSIS — Z23 Encounter for immunization: Secondary | ICD-10-CM | POA: Diagnosis not present

## 2015-04-23 ENCOUNTER — Encounter (HOSPITAL_COMMUNITY)
Admission: RE | Admit: 2015-04-23 | Discharge: 2015-04-23 | Disposition: A | Discharge status: Home / Self Care | Payer: Commercial Managed Care - HMO | Source: Ambulatory Visit | Attending: Internal Medicine | Admitting: Internal Medicine

## 2015-04-25 ENCOUNTER — Encounter (HOSPITAL_COMMUNITY)
Admission: RE | Admit: 2015-04-25 | Discharge: 2015-04-25 | Disposition: A | Discharge status: Home / Self Care | Payer: Commercial Managed Care - HMO | Source: Ambulatory Visit | Attending: Internal Medicine | Admitting: Internal Medicine

## 2015-04-25 ENCOUNTER — Encounter: Payer: Self-pay | Admitting: Internal Medicine

## 2015-04-30 ENCOUNTER — Encounter (HOSPITAL_COMMUNITY)
Admission: RE | Admit: 2015-04-30 | Discharge: 2015-04-30 | Disposition: A | Discharge status: Home / Self Care | Payer: Commercial Managed Care - HMO | Source: Ambulatory Visit | Attending: Internal Medicine | Admitting: Internal Medicine

## 2015-05-02 ENCOUNTER — Encounter (HOSPITAL_COMMUNITY)
Admission: RE | Admit: 2015-05-02 | Discharge: 2015-05-02 | Disposition: A | Discharge status: Home / Self Care | Payer: Commercial Managed Care - HMO | Source: Ambulatory Visit | Attending: Internal Medicine | Admitting: Internal Medicine

## 2015-05-03 ENCOUNTER — Other Ambulatory Visit: Payer: Self-pay | Admitting: Internal Medicine

## 2015-05-07 ENCOUNTER — Encounter (HOSPITAL_COMMUNITY)
Admission: RE | Admit: 2015-05-07 | Discharge: 2015-05-07 | Disposition: A | Discharge status: Home / Self Care | Payer: Self-pay | Source: Ambulatory Visit | Attending: Internal Medicine | Admitting: Internal Medicine

## 2015-05-07 ENCOUNTER — Ambulatory Visit (INDEPENDENT_AMBULATORY_CARE_PROVIDER_SITE_OTHER): Payer: Commercial Managed Care - HMO | Admitting: Internal Medicine

## 2015-05-07 ENCOUNTER — Encounter: Payer: Self-pay | Admitting: Internal Medicine

## 2015-05-07 ENCOUNTER — Other Ambulatory Visit: Payer: Self-pay | Admitting: Acute Care

## 2015-05-07 VITALS — BP 148/94 | HR 100 | Ht 67.0 in | Wt 191.0 lb

## 2015-05-07 DIAGNOSIS — J42 Unspecified chronic bronchitis: Secondary | ICD-10-CM | POA: Diagnosis not present

## 2015-05-07 DIAGNOSIS — J441 Chronic obstructive pulmonary disease with (acute) exacerbation: Secondary | ICD-10-CM

## 2015-05-07 DIAGNOSIS — Z23 Encounter for immunization: Secondary | ICD-10-CM

## 2015-05-07 DIAGNOSIS — Z87891 Personal history of nicotine dependence: Secondary | ICD-10-CM

## 2015-05-07 DIAGNOSIS — J9611 Chronic respiratory failure with hypoxia: Secondary | ICD-10-CM

## 2015-05-07 DIAGNOSIS — J449 Chronic obstructive pulmonary disease, unspecified: Secondary | ICD-10-CM | POA: Diagnosis not present

## 2015-05-07 NOTE — Progress Notes (Signed)
Subjective:     Patient ID: Katie Greene, female   DOB: [birth year], 59 y.o.   MRN: [id number]  HPI   OV 05/07/2015  Chief Complaint  Patient presents with  . Follow-up    Pt here for 3 month ROV. Pt states she feels she is doing well and fells her breathing is doing well also. Pt states she has a cough that is very seldom. Pt denies CP/tightness     Follow-up severe COPD with chronic respiratory failure  - Overall she is doing well. Her Symbicort as expensive because she is in the donut hole. It cost $400. She is up-to-date with flu shot but has not had Prevnar. She's had Pneumovax though. No new issues. Uses oxygen and other inhalers as before. COPD stable.   Cancer screen  - last ct aug 2015 #lung cancer screening I discussed screening Ct chest for early detection of lung cancer Explained that in age 27-75 and smoking history, annual low dose CT chest can pick up lung cancer early and has potential to save lives and cure lung cancer This is similar in concept to screening mammogram, colonoscopies and pap smears I explained Ct scan is low dose radiation I explained early lung cancer asymptomatic and only way to  detect is CT  With the real advantage that early lung cancer is curable through radiation or surgery - for her sugery is not an option I explained CT superior to CXR I explained that false positives are present and can incur cost and workup like biopsies, additional scan but benefit outweighs risk I recommend one a year for a minimum of 3 years   Immunization History  Administered Date(s) Administered  . Influenza Split 06/10/2012, 04/03/2014, 04/20/2015  . Influenza,inj,Quad PF,36+ Mos 04/24/2013  . Pneumococcal Polysaccharide-23 06/10/2012  . Zoster 05/10/2014  \ Social history: Her sister continues to battle with stage IV colon cancer. Her functional capacity is good. Patient is the healthcare power of attorney for the sister. Sr. has schizophrenia and patient is  struggling with that.    Current outpatient prescriptions:  .  Acetylcysteine 600 MG CAPS, Take 1 capsule by mouth 2 (two) times daily., Disp: , Rfl:  .  albuterol (VENTOLIN HFA) 108 (90 BASE) MCG/ACT inhaler, Inhale 2 puffs into the lungs every 6 (six) hours as needed., Disp: 1 Inhaler, Rfl: 5 .  aspirin 81 MG tablet, Take 81 mg by mouth daily.  , Disp: , Rfl:  .  budesonide-formoterol (SYMBICORT) 160-4.5 MCG/ACT inhaler, Inhale 2 puffs into the lungs 2 (two) times daily., Disp: 1 Inhaler, Rfl: 6 .  diltiazem (CARDIZEM CD) 180 MG 24 hr capsule, Take 180 mg by mouth daily., Disp: , Rfl:  .  ferrous fumarate (HEMOCYTE - 106 MG FE) 325 (106 FE) MG TABS tablet, Take 1 tablet by mouth daily., Disp: , Rfl:  .  fluticasone (FLONASE) 50 MCG/ACT nasal spray, Place 2 sprays into both nostrils daily., Disp: 16 g, Rfl: 5 .  hydrochlorothiazide (HYDRODIURIL) 25 MG tablet, Take 25 mg by mouth daily., Disp: , Rfl:  .  meloxicam (MOBIC) 7.5 MG tablet, 1 tablet Daily., Disp: , Rfl:  .  pravastatin (PRAVACHOL) 40 MG tablet, Take 40 mg by mouth daily., Disp: , Rfl:  .  SPIRIVA HANDIHALER 18 MCG inhalation capsule, PLACE 1 CAPSULE INTO THE INHALER AND INHALE THE CONTENTS OF IT AS INSTRUCTED EVERY DAY , Disp: 90 capsule, Rfl: 1 .  spironolactone (ALDACTONE) 50 MG tablet, Take 50 mg by mouth daily.,  Disp: , Rfl:  .  temazepam (RESTORIL) 15 MG capsule, 1 tablet Daily., Disp: , Rfl:  .  TOPROL XL 50 MG 24 hr tablet, 1 tablet Daily., Disp: , Rfl:   Review of Systems     Objective:   Physical Exam  Constitutional: She is oriented to person, place, and time. She appears well-developed and well-nourished. No distress.  Body mass index is 29.91 kg/(m^2). '  HENT:  Head: Normocephalic and atraumatic.  Right Ear: External ear normal.  Left Ear: External ear normal.  Mouth/Throat: Oropharynx is clear and moist. No oropharyngeal exudate.  Eyes: Conjunctivae and EOM are normal. Pupils are equal, round, and reactive  to light. Right eye exhibits no discharge. Left eye exhibits no discharge. No scleral icterus.  Neck: Normal range of motion. Neck supple. No JVD present. No tracheal deviation present. No thyromegaly present.  Cardiovascular: Normal rate, regular rhythm, normal heart sounds and intact distal pulses.  Exam reveals no gallop and no friction rub.   No murmur heard. Pulmonary/Chest: Effort normal and breath sounds normal. No respiratory distress. She has no wheezes. She has no rales. She exhibits no tenderness.  barrell chest o2 on  Abdominal: Soft. Bowel sounds are normal. She exhibits no distension and no mass. There is no tenderness. There is no rebound and no guarding.  Musculoskeletal: Normal range of motion. She exhibits no edema or tenderness.  Lymphadenopathy:    She has no cervical adenopathy.  Neurological: She is alert and oriented to person, place, and time. She has normal reflexes. No cranial nerve deficit. She exhibits normal muscle tone. Coordination normal.  Skin: Skin is warm and dry. No rash noted. She is not diaphoretic. No erythema. No pallor.  Psychiatric: She has a normal mood and affect. Her behavior is normal. Judgment and thought content normal.  Vitals reviewed.   Filed Vitals:   05/07/15 1334  BP: 148/94  Pulse: 100  Height: 5\' 7"  (1.702 m)  Weight: 191 lb (86.637 kg)  SpO2: 96%         Assessment:       ICD-9-CM ICD-10-CM   1. COPD, severe ([place]) 496 J44.9   2. Chronic respiratory failure with hypoxia (HCC) 518.83 J96.11    799.02    3. COPD exacerbation ([place]) 491.21 J44.1        Plan:      Stable disesase Contnue o2 and inhalers as before Prevnar 05/07/2015 Low Dose CT for cancer screening  folllowup 4-5 months or sooner if needed      Dr. Brand Males, M.D., [facility].C.P Pulmonary and Critical Care Medicine Staff Physician [place] [facility] Pager: [phone], If no answer or between  15:00h -  7:00h: call [phone]  05/07/2015 1:51 PM    Dr. Brand Males, M.D., [facility].C.P Pulmonary and Critical Care Medicine Staff Physician [place] [facility] Pager: [phone], If no answer or between  15:00h - 7:00h: call [phone]  05/07/2015 1:49 PM

## 2015-05-07 NOTE — Patient Instructions (Addendum)
ICD-9-CM ICD-10-CM   1. COPD, severe ([place]) 496 J44.9   2. Chronic respiratory failure with hypoxia (HCC) 518.83 J96.11    799.02    3. COPD exacerbation (HCC) 491.21 J44.1    Stable disesase Contnue o2 and inhalers as before Prevnar 05/07/2015 Low Dose CT for cancer screening  folllowup 4-5 months or sooner if needed

## 2015-05-07 NOTE — Addendum Note (Signed)
Addended by: Maurice March on: 05/07/2015 05:31 PM   Modules accepted: Orders

## 2015-05-09 ENCOUNTER — Encounter (HOSPITAL_COMMUNITY)
Admission: RE | Admit: 2015-05-09 | Discharge: 2015-05-09 | Disposition: A | Discharge status: Home / Self Care | Payer: Self-pay | Source: Ambulatory Visit | Attending: Internal Medicine | Admitting: Internal Medicine

## 2015-05-09 ENCOUNTER — Encounter: Payer: Self-pay | Admitting: Emergency Medicine

## 2015-05-14 ENCOUNTER — Encounter (HOSPITAL_COMMUNITY)
Admission: RE | Admit: 2015-05-14 | Discharge: 2015-05-14 | Disposition: A | Discharge status: Home / Self Care | Payer: Self-pay | Source: Ambulatory Visit | Attending: Internal Medicine | Admitting: Internal Medicine

## 2015-05-16 ENCOUNTER — Ambulatory Visit (INDEPENDENT_AMBULATORY_CARE_PROVIDER_SITE_OTHER)
Admission: RE | Admit: 2015-05-16 | Discharge: 2015-05-16 | Disposition: A | Discharge status: Home / Self Care | Payer: Commercial Managed Care - HMO | Source: Ambulatory Visit | Attending: Acute Care | Admitting: Acute Care

## 2015-05-16 ENCOUNTER — Encounter (HOSPITAL_COMMUNITY): Payer: Self-pay

## 2015-05-16 DIAGNOSIS — Z87891 Personal history of nicotine dependence: Secondary | ICD-10-CM

## 2015-05-17 ENCOUNTER — Telehealth: Payer: Self-pay | Admitting: Internal Medicine

## 2015-05-17 NOTE — Telephone Encounter (Signed)
lmomtcb x1 for pt 

## 2015-05-17 NOTE — Telephone Encounter (Signed)
Katie Greene (0cc Eric Form)   Let [place] know that no cancer on CT. Needs repeat Low Dose CT in 1 year   Also, cad calcification seen . She had normal stress test in 2010. If no chest pain, then can watch  Thanks  Dr. Brand Males, M.D., [facility].C.P Pulmonary and Critical Care Medicine Staff Physician [place] [facility] Pager: [phone], If no answer or between  15:00h - 7:00h: call [phone]  05/17/2015 7:16 AM     Ct Chest Lung Ca Screen Low Dose W/o Cm  05/16/2015  CLINICAL DATA:  59 year old female former smoker (quit 40 years ago) with 30 pack-year history of smoking. Lung cancer screening examination. EXAM: CT CHEST WITHOUT CONTRAST TECHNIQUE: Multidetector CT imaging of the chest was performed following the standard protocol without IV contrast. COMPARISON:  Multiple priors, most recently Chest CT 03/29/2014. FINDINGS: Mediastinum/Lymph Nodes: Heart size is normal. There is no significant pericardial fluid, thickening or pericardial calcification. There is atherosclerosis of the thoracic aorta, the great vessels of the mediastinum and the coronary arteries, including calcified atherosclerotic plaque in the left anterior descending and right coronary arteries. No pathologically enlarged mediastinal or hilar lymph nodes. Please note that accurate exclusion of hilar adenopathy is limited on noncontrast CT scans. Esophagus is unremarkable in appearance. No axillary lymphadenopathy. Lungs/Pleura: Multiple small pulmonary nodules are noted in the lungs bilaterally, largest of which has a volume derived mean diameter of 4.7 mm in the medial aspect of the right upper lobe (Image 167 of series 5). Small calcified granuloma in the left lower lobe. No other larger more suspicious appearing pulmonary nodules or masses are otherwise noted. No acute consolidative airspace disease. No pleural effusions. Mild diffuse bronchial wall thickening with moderate  centrilobular and paraseptal emphysema, most pronounced in the lung apices. Upper Abdomen: Surgical clips near the gallbladder fossa, presumably from prior cholecystectomy (incompletely visualized). Musculoskeletal/Soft Tissues: There are no aggressive appearing lytic or blastic lesions noted in the visualized portions of the skeleton. IMPRESSION: 1. Lung-RADS Category 2S, benign appearance or behavior. Continue annual screening with low-dose chest CT without contrast in 12 months. 2. The "S" modifier above refers to potentially clinically significant non lung cancer related findings. Specifically, there is atherosclerosis, including 2 vessel coronary artery disease. Please note that although the presence of coronary artery calcium documents the presence of coronary artery disease, the severity of this disease and any potential stenosis cannot be assessed on this non-gated CT examination. Assessment for potential risk factor modification, dietary therapy or pharmacologic therapy may be warranted, if clinically indicated. 3. Mild diffuse bronchial wall thickening with moderate centrilobular and paraseptal emphysema; imaging findings suggestive of underlying COPD. Electronically Signed   By: Vinnie Langton M.D.   On: 05/16/2015 12:45

## 2015-05-20 ENCOUNTER — Encounter: Payer: Self-pay | Admitting: Gastroenterology

## 2015-05-20 ENCOUNTER — Ambulatory Visit (INDEPENDENT_AMBULATORY_CARE_PROVIDER_SITE_OTHER): Payer: Commercial Managed Care - HMO | Admitting: Gastroenterology

## 2015-05-20 VITALS — BP 100/72 | HR 92 | Ht 67.0 in | Wt 194.0 lb

## 2015-05-20 DIAGNOSIS — Z86010 Personal history of colonic polyps: Secondary | ICD-10-CM | POA: Diagnosis not present

## 2015-05-20 DIAGNOSIS — R195 Other fecal abnormalities: Secondary | ICD-10-CM | POA: Diagnosis not present

## 2015-05-20 DIAGNOSIS — Z800 Family history of malignant neoplasm of digestive organs: Secondary | ICD-10-CM

## 2015-05-20 DIAGNOSIS — J441 Chronic obstructive pulmonary disease with (acute) exacerbation: Secondary | ICD-10-CM | POA: Diagnosis not present

## 2015-05-20 MED ORDER — NA SULFATE-K SULFATE-MG SULF 17.5-3.13-1.6 GM/177ML PO SOLN: ORAL | Status: DC

## 2015-05-20 NOTE — Progress Notes (Signed)
HPI :  59 y/o female here in consultation for positive hemeoccult stool sample in August 2016, ordered by [facility] Dr. Leanna Battles.   The patient denies any overt blood in the stools. She has some baseline mild constipation over time but no new changes. No abdominal pains. No unintentional weight loss. The patient denies a history of anemia.  Her sister has stage IV colon cancer, around age 81.  She has been on home oxygen for 3 years or so for COPD. She reports she is able to ambulate okay without limitations for the most part, sometimes uses wheelchair for longer distance. No history of MI or CVA. She reports a history of having a stress test in the past which was okay (reported normal 2010).  Patient otherwise denies complaints at this time. No chest pains.   Her last colonoscopy was in 2012, done by Dr. Sharlett Iles, normal without polyps. Colonoscopy 2007 - 61mm rectal adenoma with incomplete evaluation of R colon     Past Medical History  Diagnosis Date  . Anxiety   . Arthritis   . Hyperlipidemia   . Hypertension   . Palpitations   . Fatigue   . Chest pain     Improved  . 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 Oopherectomy      1 ovary removed only  . Salpingectomy      1 fallopian tube removed  . Cholecystectomy    . Colonoscopy    . Polypectomy     Family History  Problem Relation Age of Onset  . Heart attack Mother   . Pancreatic cancer Mother   . Prostate cancer Father   . Hypertension Father   . Lung cancer Father   . Throat cancer Brother     Throat Cancer  . Colon cancer Sister    Social History  Substance Use Topics  . Smoking status: Former Smoker -- 0.50 packs/day for 25 years    Types: Cigarettes    Quit date: 03/02/2012  . Smokeless tobacco: Never Used     Comment: pt states she smoked some and stopped again 8-13.  Marland Kitchen Alcohol Use: No   Current Outpatient  Prescriptions  Medication Sig Dispense Refill  . Acetylcysteine 600 MG CAPS Take 1 capsule by mouth 2 (two) times daily.    Marland Kitchen albuterol (VENTOLIN HFA) 108 (90 BASE) MCG/ACT inhaler Inhale 2 puffs into the lungs every 6 (six) hours as needed. 1 Inhaler 5  . aspirin 81 MG tablet Take 81 mg by mouth daily.      . budesonide-formoterol (SYMBICORT) 160-4.5 MCG/ACT inhaler Inhale 2 puffs into the lungs 2 (two) times daily. 1 Inhaler 6  . diltiazem (CARDIZEM CD) 180 MG 24 hr capsule Take 180 mg by mouth daily.    . ferrous fumarate (HEMOCYTE - 106 MG FE) 325 (106 FE) MG TABS tablet Take 1 tablet by mouth daily.    . fluticasone (FLONASE) 50 MCG/ACT nasal spray Place 2 sprays into both nostrils daily. 16 g 5  . hydrochlorothiazide (HYDRODIURIL) 25 MG tablet Take 25 mg by mouth daily.    . meloxicam (MOBIC) 7.5 MG tablet 1 tablet Daily.    . pravastatin (PRAVACHOL) 40 MG tablet Take 40 mg by mouth daily.    Marland Kitchen SPIRIVA HANDIHALER 18 MCG inhalation capsule PLACE 1 CAPSULE INTO THE INHALER AND INHALE THE CONTENTS OF IT AS INSTRUCTED EVERY  DAY  90 capsule 1  . spironolactone (ALDACTONE) 50 MG tablet Take 50 mg by mouth daily.    . temazepam (RESTORIL) 15 MG capsule 1 tablet Daily.    . TOPROL XL 50 MG 24 hr tablet 1 tablet Daily.    . Na Sulfate-K Sulfate-Mg Sulf SOLN Take as directed per Colonoscopy. 354 mL 0   No current facility-administered medications for this visit.   No Known Allergies   Review of Systems: All systems reviewed and negative except where noted in HPI.   Lab Results  Component Value Date   WBC 10.3 06/20/2013   HGB 11.8* 06/20/2013   HCT 35.0* 06/20/2013   MCV 85.5 06/20/2013   PLT 342.0 06/20/2013   No results found for: ALT, AST, GGT, ALKPHOS, BILITOT  No results found for: CREATININE, BUN, NA, K, CL, CO2    Physical Exam: BP 100/72 mmHg  Pulse 92  Ht 5\' 7"  (1.702 m)  Wt 194 lb (87.998 kg)  BMI 30.38 kg/m2 Constitutional: Pleasant,well-developed, female in no  acute distress, nasal canula in place HEENT: Normocephalic and atraumatic. Conjunctivae are normal. No scleral icterus. Neck supple.  Cardiovascular: Normal rate, regular rhythm.  Pulmonary/chest: Effort normal, slightly decreased BS B. No wheezing, rales or rhonchi. Abdominal: Soft, nondistended, nontender. Bowel sounds active throughout. There are no masses palpable. No hepatomegaly. Extremities: no edema Lymphadenopathy: No cervical adenopathy noted. Neurological: Alert and oriented to person place and time. Skin: Skin is warm and dry. No rashes noted. Psychiatric: Normal mood and affect. Behavior is normal.   ASSESSMENT AND PLAN: 59 y/o female with history of oxygen dependant COPD referred for a positive FOBT. She has no overt GI bleeding or lower GI tract symptoms other than stable mild constipation. Her sister has stage IV colon cancer. The patient has a history of adenomatous polyps, but her last exam in 2012 was normal. Last CBC showed mild anemia but from 2014, don't have results of more recent blood work today. I discussed the positive FOBT with her, and the differential of what this could represent. While she has a family history of colon cancer and is concerned about this, I think it is unlikely she has a colon cancer given her reportedly normal exam in 2012, unless a lesion was missed. The test result could be related otherwise to the interval development of a colon polyp or hemorrhoids, etc. I discussed colonoscopy with her and the risks / benefits in light of her oxygen dependant COPD. She reports COPD Is stable. I offered her a colonoscopy to evaluate this positive stool test and the patient wished to proceed with it following our discussion below. Her case will be done at [facility] given her oxygen requirement.   The indications, risks, and benefits of colonoscopy were explained to the patient in detail. Risks include but are not limited to bleeding, perforation, adverse reaction to  medications, and cardiopulmonary compromise. Sequelae include but are not limited to the possibility of surgery, hospitalization, and mortality. The patient verbalized understanding and wished to proceed. All questions answered, referred to the scheduler and bowel prep ordered. Further recommendations pending results of the exam.   [place] Cellar, MD [facility] Pager [phone]   CC: Dr. Leanna Battles

## 2015-05-20 NOTE — Patient Instructions (Signed)
You have been scheduled for a colonoscopy. Please follow written instructions given to you at your visit today.  Please pick up your prep supplies at the pharmacy within the next 1-3 days. If you use inhalers (even only as needed), please bring them with you on the day of your procedure.   

## 2015-05-20 NOTE — Telephone Encounter (Signed)
Called and spoke to pt. Informed her of the results and recs per MR. Spoke with Eric Form and the pt will be contacted to schedule scan. Pt verbalized understanding and denied any further questions or concerns at this time.

## 2015-05-21 ENCOUNTER — Encounter (HOSPITAL_COMMUNITY)
Admission: RE | Admit: 2015-05-21 | Discharge: 2015-05-21 | Disposition: A | Discharge status: Home / Self Care | Payer: Self-pay | Source: Ambulatory Visit | Attending: Internal Medicine | Admitting: Internal Medicine

## 2015-05-23 ENCOUNTER — Telehealth: Payer: Self-pay | Admitting: Internal Medicine

## 2015-05-23 ENCOUNTER — Encounter (HOSPITAL_COMMUNITY)
Admission: RE | Admit: 2015-05-23 | Discharge: 2015-05-23 | Disposition: A | Discharge status: Home / Self Care | Payer: Self-pay | Source: Ambulatory Visit | Attending: Internal Medicine | Admitting: Internal Medicine

## 2015-05-23 NOTE — Telephone Encounter (Signed)
Called and spoke to pt. Pt questioning the CT scan and states she saw there was coronary artery calcification. Reiterated the results per MR. Pt verbalized understanding and denied any further questions or concerns at this time.     Brand Males, MD at 05/17/2015 7:16 AM     Status: Signed       Expand All Collapse All   [name] (0cc Eric Form)   Let tahir know that no cancer on CT. Needs repeat Low Dose CT in 1 year   Also, cad calcification seen . She had normal stress test in 2010. If no chest pain, then can watch  Thanks

## 2015-05-28 ENCOUNTER — Encounter (HOSPITAL_COMMUNITY): Payer: Self-pay

## 2015-05-30 ENCOUNTER — Encounter (HOSPITAL_COMMUNITY): Payer: Self-pay

## 2015-06-04 ENCOUNTER — Encounter (HOSPITAL_COMMUNITY)
Admission: RE | Admit: 2015-06-04 | Discharge: 2015-06-04 | Disposition: A | Discharge status: Home / Self Care | Payer: Self-pay | Source: Ambulatory Visit | Attending: Internal Medicine | Admitting: Internal Medicine

## 2015-06-04 DIAGNOSIS — J42 Unspecified chronic bronchitis: Secondary | ICD-10-CM | POA: Insufficient documentation

## 2015-06-06 ENCOUNTER — Encounter (HOSPITAL_COMMUNITY)
Admission: RE | Admit: 2015-06-06 | Discharge: 2015-06-06 | Disposition: A | Discharge status: Home / Self Care | Payer: Self-pay | Source: Ambulatory Visit | Attending: Internal Medicine | Admitting: Internal Medicine

## 2015-06-07 DIAGNOSIS — J449 Chronic obstructive pulmonary disease, unspecified: Secondary | ICD-10-CM | POA: Diagnosis not present

## 2015-06-18 ENCOUNTER — Encounter (HOSPITAL_COMMUNITY)
Admission: RE | Admit: 2015-06-18 | Discharge: 2015-06-18 | Disposition: A | Discharge status: Home / Self Care | Payer: Self-pay | Source: Ambulatory Visit | Attending: Internal Medicine | Admitting: Internal Medicine

## 2015-06-20 ENCOUNTER — Encounter (HOSPITAL_COMMUNITY)
Admission: RE | Admit: 2015-06-20 | Discharge: 2015-06-20 | Disposition: A | Discharge status: Home / Self Care | Payer: Self-pay | Source: Ambulatory Visit | Attending: Internal Medicine | Admitting: Internal Medicine

## 2015-06-21 ENCOUNTER — Encounter (HOSPITAL_COMMUNITY): Payer: Self-pay | Admitting: *Deleted

## 2015-06-24 ENCOUNTER — Encounter: Payer: Self-pay | Admitting: Internal Medicine

## 2015-06-24 MED ORDER — CEPHALEXIN 500 MG PO CAPS: 500.0000 mg | ORAL_CAPSULE | Freq: Three times a day (TID) | ORAL | Status: DC

## 2015-06-24 MED ORDER — PREDNISONE 10 MG PO TABS
ORAL_TABLET | ORAL | Status: DC
Start: 2015-06-24 — End: 2015-09-16

## 2015-06-24 NOTE — Telephone Encounter (Signed)
She might have aecopd  cephalxin 500mg  tid x 5 days  Please take prednisone 40 mg x1 day, then 30 mg x1 day, then 20 mg x1 day, then 10 mg x1 day, and then 5 mg x1 day and stop

## 2015-06-24 NOTE — Telephone Encounter (Signed)
Email sent by pt: -------------------------------- Good Morning,Hope all is well.I have a cough it's dry sometimes,,then sometimes I cough up mucus yellow,white, and very little green..This cough keeps me up most of the night.I have no fever. Over the counter cough medicine don't seem to give me a lot of relief . Could you ask Dr Chase Caller if he can call me n something for the cough,or tell me what I need to do.I was there on 05/07/15.Weather has changed a lot and I had to be out in it and I called myself making sure I was covered very well. For short time meds I use [facility] [phone].Have a Blessed day and HAPPY THANKSGIVING. Newt Lukes --------------------------------  MR please advise on recs.  Thanks!

## 2015-06-25 ENCOUNTER — Encounter (HOSPITAL_COMMUNITY)
Admission: RE | Admit: 2015-06-25 | Discharge: 2015-06-25 | Disposition: A | Discharge status: Home / Self Care | Payer: Self-pay | Source: Ambulatory Visit | Attending: Internal Medicine | Admitting: Internal Medicine

## 2015-06-27 ENCOUNTER — Encounter (HOSPITAL_COMMUNITY): Payer: Self-pay

## 2015-07-02 ENCOUNTER — Encounter (HOSPITAL_COMMUNITY)
Admission: RE | Admit: 2015-07-02 | Discharge: 2015-07-02 | Disposition: A | Discharge status: Home / Self Care | Payer: Self-pay | Source: Ambulatory Visit | Attending: Internal Medicine | Admitting: Internal Medicine

## 2015-07-04 ENCOUNTER — Encounter (HOSPITAL_COMMUNITY)
Admission: RE | Admit: 2015-07-04 | Discharge: 2015-07-04 | Disposition: A | Discharge status: Home / Self Care | Payer: Self-pay | Source: Ambulatory Visit | Attending: Internal Medicine | Admitting: Internal Medicine

## 2015-07-04 ENCOUNTER — Ambulatory Visit (HOSPITAL_COMMUNITY)
Admission: RE | Admit: 2015-07-04 | Payer: Commercial Managed Care - HMO | Source: Ambulatory Visit | Admitting: Gastroenterology

## 2015-07-04 DIAGNOSIS — J42 Unspecified chronic bronchitis: Secondary | ICD-10-CM | POA: Insufficient documentation

## 2015-07-04 SURGERY — COLONOSCOPY WITH PROPOFOL
Anesthesia: Monitor Anesthesia Care

## 2015-07-07 DIAGNOSIS — J449 Chronic obstructive pulmonary disease, unspecified: Secondary | ICD-10-CM | POA: Diagnosis not present

## 2015-07-09 ENCOUNTER — Encounter: Payer: Self-pay | Admitting: *Deleted

## 2015-07-09 ENCOUNTER — Other Ambulatory Visit: Payer: Self-pay | Admitting: *Deleted

## 2015-07-09 ENCOUNTER — Encounter (HOSPITAL_COMMUNITY): Payer: Self-pay

## 2015-07-09 ENCOUNTER — Telehealth: Payer: Self-pay | Admitting: Gastroenterology

## 2015-07-09 DIAGNOSIS — K921 Melena: Secondary | ICD-10-CM

## 2015-07-09 NOTE — Telephone Encounter (Signed)
Rescheduled Colonoscopy at [facility] endo on 08/27/15 at 12:00 PM.(Jill). New instructions mailed.

## 2015-07-11 ENCOUNTER — Encounter (HOSPITAL_COMMUNITY): Payer: Self-pay

## 2015-07-16 ENCOUNTER — Encounter (HOSPITAL_COMMUNITY)
Admission: RE | Admit: 2015-07-16 | Discharge: 2015-07-16 | Disposition: A | Discharge status: Home / Self Care | Payer: Self-pay | Source: Ambulatory Visit | Attending: Internal Medicine | Admitting: Internal Medicine

## 2015-07-18 ENCOUNTER — Encounter (HOSPITAL_COMMUNITY)
Admission: RE | Admit: 2015-07-18 | Discharge: 2015-07-18 | Disposition: A | Discharge status: Home / Self Care | Payer: Self-pay | Source: Ambulatory Visit | Attending: Internal Medicine | Admitting: Internal Medicine

## 2015-07-22 ENCOUNTER — Encounter: Payer: Self-pay | Admitting: Internal Medicine

## 2015-07-22 ENCOUNTER — Telehealth: Payer: Self-pay | Admitting: Internal Medicine

## 2015-07-22 NOTE — Telephone Encounter (Signed)
Order will be signed on 07/24/15 when MR returns to office  Will forward message to [place] to follow up on

## 2015-07-22 NOTE — Telephone Encounter (Signed)
Spoke with the pt  She is needing help with pt assistance for symbicort through [employer]  She states that they were supposed to have faxed form for MR to sign for this 2 wks ago  [name], have you seen this?

## 2015-07-22 NOTE — Telephone Encounter (Signed)
See phone note dated 07-22-15

## 2015-07-23 ENCOUNTER — Encounter (HOSPITAL_COMMUNITY)
Admission: RE | Admit: 2015-07-23 | Discharge: 2015-07-23 | Disposition: A | Discharge status: Home / Self Care | Payer: Self-pay | Source: Ambulatory Visit | Attending: Internal Medicine | Admitting: Internal Medicine

## 2015-07-25 ENCOUNTER — Encounter (HOSPITAL_COMMUNITY): Payer: Self-pay

## 2015-07-30 ENCOUNTER — Encounter (HOSPITAL_COMMUNITY)
Admission: RE | Admit: 2015-07-30 | Discharge: 2015-07-30 | Disposition: A | Discharge status: Home / Self Care | Payer: Self-pay | Source: Ambulatory Visit | Attending: Internal Medicine | Admitting: Internal Medicine

## 2015-08-01 ENCOUNTER — Encounter (HOSPITAL_COMMUNITY)
Admission: RE | Admit: 2015-08-01 | Discharge: 2015-08-01 | Disposition: A | Discharge status: Home / Self Care | Payer: Self-pay | Source: Ambulatory Visit | Attending: Internal Medicine | Admitting: Internal Medicine

## 2015-08-06 ENCOUNTER — Encounter: Payer: Self-pay | Admitting: Internal Medicine

## 2015-08-06 ENCOUNTER — Encounter (HOSPITAL_COMMUNITY)
Admission: RE | Admit: 2015-08-06 | Discharge: 2015-08-06 | Disposition: A | Discharge status: Home / Self Care | Payer: Self-pay | Source: Ambulatory Visit | Attending: Internal Medicine | Admitting: Internal Medicine

## 2015-08-06 DIAGNOSIS — J42 Unspecified chronic bronchitis: Secondary | ICD-10-CM | POA: Insufficient documentation

## 2015-08-07 ENCOUNTER — Telehealth: Payer: Self-pay | Admitting: Internal Medicine

## 2015-08-07 DIAGNOSIS — J449 Chronic obstructive pulmonary disease, unspecified: Secondary | ICD-10-CM | POA: Diagnosis not present

## 2015-08-07 NOTE — Telephone Encounter (Signed)
Will forward to [employer] to f/u on

## 2015-08-08 ENCOUNTER — Encounter (HOSPITAL_COMMUNITY)
Admission: RE | Admit: 2015-08-08 | Discharge: 2015-08-08 | Disposition: A | Discharge status: Home / Self Care | Payer: Self-pay | Source: Ambulatory Visit | Attending: Internal Medicine | Admitting: Internal Medicine

## 2015-08-08 DIAGNOSIS — Z6829 Body mass index (BMI) 29.0-29.9, adult: Secondary | ICD-10-CM | POA: Diagnosis not present

## 2015-08-08 DIAGNOSIS — L0889 Other specified local infections of the skin and subcutaneous tissue: Secondary | ICD-10-CM | POA: Diagnosis not present

## 2015-08-08 NOTE — Telephone Encounter (Signed)
Received [place] forms, they have been faxed. Also, received the [employer] forms, they have been completed, however the [employer] forms are needing the pt's signature. Called and spoke to pt and informed her pt assistance forms have been sent off and the [employer] form is needing her signature. Pt is requesting the [employer] forms be mailed to her home (home address verified) and she will drop them off at a later date for the form to faxed from our office. [employer] forms placed in outgoing mail and a copy has been placed in MR's scan folder of both the [employer] forms and the [place] forms. Nothing further needed at this time.

## 2015-08-09 ENCOUNTER — Other Ambulatory Visit: Payer: Self-pay | Admitting: Internal Medicine

## 2015-08-13 ENCOUNTER — Encounter (HOSPITAL_COMMUNITY): Payer: Self-pay

## 2015-08-15 ENCOUNTER — Encounter (HOSPITAL_COMMUNITY)
Admission: RE | Admit: 2015-08-15 | Discharge: 2015-08-15 | Disposition: A | Discharge status: Home / Self Care | Payer: Self-pay | Source: Ambulatory Visit | Attending: Internal Medicine | Admitting: Internal Medicine

## 2015-08-16 ENCOUNTER — Telehealth: Payer: Self-pay | Admitting: Internal Medicine

## 2015-08-19 ENCOUNTER — Telehealth: Payer: Self-pay | Admitting: Internal Medicine

## 2015-08-19 NOTE — Telephone Encounter (Signed)
Pt is returning call again ° °

## 2015-08-19 NOTE — Telephone Encounter (Signed)
Duplicate message. Will close encounter.  

## 2015-08-19 NOTE — Telephone Encounter (Signed)
Spoke with pt, states the form dropped off was her [employer] form.  Look in MR's inbasket, did not see forms for pt. MR please advise if you have [employer] form.  Thanks!

## 2015-08-19 NOTE — Telephone Encounter (Signed)
I do not see anything in MR box on this patient - no forms in any folders to be found LM for pt x 1 to ask what forms she dropped off so that we have an idea as to what we are looking for. May have looked over her name when looking through papers.  Will await call back

## 2015-08-19 NOTE — Telephone Encounter (Signed)
lmtcb x2 for pt. 

## 2015-08-19 NOTE — Telephone Encounter (Signed)
Pt returned call [phone]

## 2015-08-20 ENCOUNTER — Encounter (HOSPITAL_COMMUNITY): Payer: Self-pay | Admitting: *Deleted

## 2015-08-20 ENCOUNTER — Encounter (HOSPITAL_COMMUNITY)
Admission: RE | Admit: 2015-08-20 | Discharge: 2015-08-20 | Disposition: A | Discharge status: Home / Self Care | Payer: Self-pay | Source: Ambulatory Visit | Attending: Internal Medicine | Admitting: Internal Medicine

## 2015-08-21 NOTE — Telephone Encounter (Signed)
[employer] form located; has been signed by MR I will hold onto forms since [name] is out of the office  LMOM TCB x1 for pt

## 2015-08-22 ENCOUNTER — Encounter (HOSPITAL_COMMUNITY)
Admission: RE | Admit: 2015-08-22 | Discharge: 2015-08-22 | Disposition: A | Discharge status: Home / Self Care | Payer: Self-pay | Source: Ambulatory Visit | Attending: Internal Medicine | Admitting: Internal Medicine

## 2015-08-22 NOTE — Telephone Encounter (Signed)
Called spoke with patient and advised that [employer] forms are ready and have been faxed Pt voiced her understanding Pt is unable to come pick these up this week, requests that we hold them for her.  Advised pt will do so and that she may call at any time to request that we mail them if she finds herself unable to come to the office.  Forms placed up front Copy sent for scan Nothing further needed; will sign off

## 2015-08-22 NOTE — Telephone Encounter (Signed)
Lmtc x 1 for pt

## 2015-08-22 NOTE — Telephone Encounter (Signed)
lmtcb x2 for pt. 

## 2015-08-22 NOTE — Telephone Encounter (Signed)
Patient Returned call [phone]

## 2015-08-27 ENCOUNTER — Ambulatory Visit (HOSPITAL_COMMUNITY): Payer: Commercial Managed Care - HMO | Admitting: Anesthesiology

## 2015-08-27 ENCOUNTER — Ambulatory Visit (HOSPITAL_COMMUNITY)
Admission: RE | Admit: 2015-08-27 | Discharge: 2015-08-27 | Disposition: A | Discharge status: Home / Self Care | Payer: Commercial Managed Care - HMO | Source: Ambulatory Visit | Attending: Gastroenterology | Admitting: Gastroenterology

## 2015-08-27 ENCOUNTER — Encounter (HOSPITAL_COMMUNITY): Payer: Self-pay

## 2015-08-27 ENCOUNTER — Encounter (HOSPITAL_COMMUNITY)
Admission: RE | Disposition: A | Discharge status: Home / Self Care | Payer: Self-pay | Source: Ambulatory Visit | Attending: Gastroenterology

## 2015-08-27 DIAGNOSIS — Z1211 Encounter for screening for malignant neoplasm of colon: Secondary | ICD-10-CM | POA: Diagnosis not present

## 2015-08-27 DIAGNOSIS — Z79899 Other long term (current) drug therapy: Secondary | ICD-10-CM | POA: Insufficient documentation

## 2015-08-27 DIAGNOSIS — K5900 Constipation, unspecified: Secondary | ICD-10-CM | POA: Diagnosis not present

## 2015-08-27 DIAGNOSIS — I10 Essential (primary) hypertension: Secondary | ICD-10-CM | POA: Insufficient documentation

## 2015-08-27 DIAGNOSIS — F419 Anxiety disorder, unspecified: Secondary | ICD-10-CM | POA: Diagnosis not present

## 2015-08-27 DIAGNOSIS — Z87891 Personal history of nicotine dependence: Secondary | ICD-10-CM | POA: Diagnosis not present

## 2015-08-27 DIAGNOSIS — Z86010 Personal history of colonic polyps: Secondary | ICD-10-CM | POA: Diagnosis not present

## 2015-08-27 DIAGNOSIS — Z7951 Long term (current) use of inhaled steroids: Secondary | ICD-10-CM | POA: Diagnosis not present

## 2015-08-27 DIAGNOSIS — M1990 Unspecified osteoarthritis, unspecified site: Secondary | ICD-10-CM | POA: Diagnosis not present

## 2015-08-27 DIAGNOSIS — E785 Hyperlipidemia, unspecified: Secondary | ICD-10-CM | POA: Diagnosis not present

## 2015-08-27 DIAGNOSIS — Z800 Family history of malignant neoplasm of digestive organs: Secondary | ICD-10-CM | POA: Diagnosis not present

## 2015-08-27 DIAGNOSIS — Z7982 Long term (current) use of aspirin: Secondary | ICD-10-CM | POA: Insufficient documentation

## 2015-08-27 DIAGNOSIS — K219 Gastro-esophageal reflux disease without esophagitis: Secondary | ICD-10-CM | POA: Insufficient documentation

## 2015-08-27 DIAGNOSIS — D123 Benign neoplasm of transverse colon: Secondary | ICD-10-CM | POA: Diagnosis not present

## 2015-08-27 DIAGNOSIS — D649 Anemia, unspecified: Secondary | ICD-10-CM | POA: Diagnosis not present

## 2015-08-27 DIAGNOSIS — D1779 Benign lipomatous neoplasm of other sites: Secondary | ICD-10-CM | POA: Insufficient documentation

## 2015-08-27 DIAGNOSIS — J449 Chronic obstructive pulmonary disease, unspecified: Secondary | ICD-10-CM | POA: Insufficient documentation

## 2015-08-27 DIAGNOSIS — K648 Other hemorrhoids: Secondary | ICD-10-CM | POA: Diagnosis not present

## 2015-08-27 DIAGNOSIS — Z9981 Dependence on supplemental oxygen: Secondary | ICD-10-CM | POA: Insufficient documentation

## 2015-08-27 DIAGNOSIS — D122 Benign neoplasm of ascending colon: Secondary | ICD-10-CM

## 2015-08-27 DIAGNOSIS — K921 Melena: Secondary | ICD-10-CM | POA: Diagnosis not present

## 2015-08-27 DIAGNOSIS — D120 Benign neoplasm of cecum: Secondary | ICD-10-CM

## 2015-08-27 HISTORY — DX: Major depressive disorder, single episode, unspecified: F32.9

## 2015-08-27 HISTORY — PX: COLONOSCOPY: SHX5424

## 2015-08-27 HISTORY — DX: Dependence on supplemental oxygen: Z99.81

## 2015-08-27 HISTORY — DX: Unspecified hemorrhoids: K64.9

## 2015-08-27 HISTORY — DX: Depression, unspecified: F32.A

## 2015-08-27 HISTORY — DX: Gastro-esophageal reflux disease without esophagitis: K21.9

## 2015-08-27 SURGERY — COLONOSCOPY
Anesthesia: Monitor Anesthesia Care

## 2015-08-27 MED ORDER — LACTATED RINGERS IV SOLN
INTRAVENOUS | Status: DC
  Administered 2015-08-27: 1000 mL via INTRAVENOUS

## 2015-08-27 MED ORDER — PROPOFOL 10 MG/ML IV BOLUS
INTRAVENOUS | Status: AC
  Filled 2015-08-27: qty 20

## 2015-08-27 MED ORDER — LIDOCAINE HCL (CARDIAC) 20 MG/ML IV SOLN
INTRAVENOUS | Status: DC | PRN
Start: 2015-08-27 — End: 2015-08-27
  Administered 2015-08-27: 50 mg via INTRAVENOUS

## 2015-08-27 MED ORDER — SODIUM CHLORIDE 0.9 % IV SOLN: INTRAVENOUS | Status: DC

## 2015-08-27 MED ORDER — PROPOFOL 10 MG/ML IV BOLUS
INTRAVENOUS | Status: DC | PRN
  Administered 2015-08-27: 10 mg via INTRAVENOUS
  Administered 2015-08-27 (×3): 20 mg via INTRAVENOUS
  Administered 2015-08-27 (×4): 30 mg via INTRAVENOUS
  Administered 2015-08-27: 50 mg via INTRAVENOUS
  Administered 2015-08-27: 20 mg via INTRAVENOUS
  Administered 2015-08-27: 30 mg via INTRAVENOUS
  Administered 2015-08-27: 10 mg via INTRAVENOUS
  Administered 2015-08-27 (×2): 20 mg via INTRAVENOUS
  Administered 2015-08-27: 30 mg via INTRAVENOUS
  Administered 2015-08-27: 10 mg via INTRAVENOUS
  Administered 2015-08-27: 20 mg via INTRAVENOUS

## 2015-08-27 NOTE — Anesthesia Preprocedure Evaluation (Signed)
Anesthesia Evaluation  Patient identified by MRN, date of birth, ID band Patient awake    Reviewed: Allergy & Precautions, NPO status , Patient's Chart, lab work & pertinent test results  Airway Mallampati: II  TM Distance: >3 FB Neck ROM: Full    Dental no notable dental hx.    Pulmonary shortness of breath, with exertion and Long-Term Oxygen Therapy, COPD,  oxygen dependent, former smoker,  Oxygen 24 hours a day. 2-3 L/minute nasal cannula   Pulmonary exam normal breath sounds clear to auscultation       Cardiovascular hypertension, Pt. on medications and Pt. on home beta blockers Normal cardiovascular exam Rhythm:Regular Rate:Normal     Neuro/Psych PSYCHIATRIC DISORDERS Anxiety Depression negative neurological ROS     GI/Hepatic Neg liver ROS, GERD  Medicated,  Endo/Other  negative endocrine ROS  Renal/GU negative Renal ROS  negative genitourinary   Musculoskeletal  (+) Arthritis ,   Abdominal   Peds negative pediatric ROS (+)  Hematology negative hematology ROS (+)   Anesthesia Other Findings   Reproductive/Obstetrics negative OB ROS                             Anesthesia Physical Anesthesia Plan  ASA: III  Anesthesia Plan: MAC   Post-op Pain Management:    Induction: Intravenous  Airway Management Planned: Natural Airway  Additional Equipment:   Intra-op Plan:   Post-operative Plan:   Informed Consent: I have reviewed the patients History and Physical, chart, labs and discussed the procedure including the risks, benefits and alternatives for the proposed anesthesia with the patient or authorized representative who has indicated his/her understanding and acceptance.   Dental advisory given  Plan Discussed with: CRNA  Anesthesia Plan Comments:         Anesthesia Quick Evaluation

## 2015-08-27 NOTE — Transfer of Care (Signed)
Immediate Anesthesia Transfer of Care Note  Patient: Katie Greene  Procedure(s) Performed: Procedure(s): COLONOSCOPY (N/A)  Patient Location: PACU  Anesthesia Type:MAC  Level of Consciousness: Patient easily awoken, sedated, comfortable, cooperative, following commands, responds to stimulation.   Airway & Oxygen Therapy: Patient spontaneously breathing, ventilating well, oxygen via simple oxygen mask.  Post-op Assessment: Report given to PACU RN, vital signs reviewed and stable, moving all extremities.   Post vital signs: Reviewed and stable.  Complications: No apparent anesthesia complications

## 2015-08-27 NOTE — Anesthesia Postprocedure Evaluation (Signed)
Anesthesia Post Note  Patient: Katie Greene  Procedure(s) Performed: Procedure(s) (LRB): COLONOSCOPY (N/A)  Patient location during evaluation: PACU Anesthesia Type: MAC Level of consciousness: awake and alert Pain management: pain level controlled Vital Signs Assessment: post-procedure vital signs reviewed and stable Respiratory status: spontaneous breathing, nonlabored ventilation, respiratory function stable and patient connected to nasal cannula oxygen Cardiovascular status: stable and blood pressure returned to baseline Anesthetic complications: no    Last Vitals:  Filed Vitals:   08/27/15 1310 08/27/15 1320  BP: 127/82 136/76  Pulse:    Temp:    Resp: 16 19    Last Pain: There were no vitals filed for this visit.               Armando Bukhari J

## 2015-08-27 NOTE — H&P (Signed)
HPI :  60 y/o female here in consultation for positive hemeoccult stool sample in August 2016, ordered by [facility] Dr. Leanna Battles.   The patient denies any overt blood in the stools. She has some baseline mild constipation over time but no new changes. No abdominal pains. No unintentional weight loss. The patient denies a history of anemia.  Her sister has stage IV colon cancer, around age 43. She has been on home oxygen for 3 years or so for COPD. She reports she is able to ambulate okay without limitations for the most part, sometimes uses wheelchair for longer distance. No history of MI or CVA. She reports a history of having a stress test in the past which was okay (reported normal 2010). Patient otherwise denies complaints at this time. No chest pains.   Her last colonoscopy was in 2012, done by Dr. Sharlett Iles, normal without polyps. Colonoscopy 2007 - 41mm rectal adenoma with incomplete evaluation of R colon     Past Medical History  Diagnosis Date  . Anxiety   . Arthritis   . Hyperlipidemia   . Hypertension   . Palpitations   . Fatigue   . Chest pain     Improved  . 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 Oopherectomy      1 ovary removed only  . Salpingectomy      1 fallopian tube removed  . Cholecystectomy    . Colonoscopy    . Polypectomy     Family History  Problem Relation Age of Onset  . Heart attack Mother   . Pancreatic cancer Mother   . Prostate cancer Father   . Hypertension Father   . Lung cancer Father   . Throat cancer Brother     Throat Cancer  . Colon cancer Sister    Social History  Substance Use Topics  . Smoking status: Former Smoker -- 0.50 packs/day for 25 years    Types: Cigarettes    Quit date: 03/02/2012  . Smokeless tobacco: Never  Used     Comment: pt states she smoked some and stopped again 8-13.  Marland Kitchen Alcohol Use: No   Current Outpatient Prescriptions  Medication Sig Dispense Refill  . Acetylcysteine 600 MG CAPS Take 1 capsule by mouth 2 (two) times daily.    Marland Kitchen albuterol (VENTOLIN HFA) 108 (90 BASE) MCG/ACT inhaler Inhale 2 puffs into the lungs every 6 (six) hours as needed. 1 Inhaler 5  . aspirin 81 MG tablet Take 81 mg by mouth daily.     . budesonide-formoterol (SYMBICORT) 160-4.5 MCG/ACT inhaler Inhale 2 puffs into the lungs 2 (two) times daily. 1 Inhaler 6  . diltiazem (CARDIZEM CD) 180 MG 24 hr capsule Take 180 mg by mouth daily.    . ferrous fumarate (HEMOCYTE - 106 MG FE) 325 (106 FE) MG TABS tablet Take 1 tablet by mouth daily.    . fluticasone (FLONASE) 50 MCG/ACT nasal spray Place 2 sprays into both nostrils daily. 16 g 5  . hydrochlorothiazide (HYDRODIURIL) 25 MG tablet Take 25 mg by mouth daily.    . meloxicam (MOBIC) 7.5 MG tablet 1 tablet Daily.    . pravastatin (PRAVACHOL) 40 MG tablet Take 40 mg by mouth daily.    Marland Kitchen SPIRIVA HANDIHALER 18 MCG inhalation capsule PLACE 1 CAPSULE INTO THE INHALER AND INHALE THE CONTENTS OF IT AS INSTRUCTED EVERY DAY  90 capsule  1  . spironolactone (ALDACTONE) 50 MG tablet Take 50 mg by mouth daily.    . temazepam (RESTORIL) 15 MG capsule 1 tablet Daily.    . TOPROL XL 50 MG 24 hr tablet 1 tablet Daily.    . Na Sulfate-K Sulfate-Mg Sulf SOLN Take as directed per Colonoscopy. 354 mL 0   No current facility-administered medications for this visit.   No Known Allergies   Review of Systems: All systems reviewed and negative except where noted in HPI.    Recent Labs    Lab Results  Component Value Date   WBC 10.3 06/20/2013   HGB 11.8* 06/20/2013   HCT 35.0* 06/20/2013   MCV 85.5 06/20/2013   PLT 342.0 06/20/2013      Recent Labs    No results  found for: ALT, AST, GGT, ALKPHOS, BILITOT     Recent Labs    No results found for: CREATININE, BUN, NA, K, CL, CO2      Physical Exam: BP 100/72 mmHg  Pulse 92  Ht 5\' 7"  (1.702 m)  Wt 194 lb (87.998 kg)  BMI 30.38 kg/m2 Constitutional: Pleasant,well-developed, female in no acute distress, nasal canula in place HEENT: Normocephalic and atraumatic. Conjunctivae are normal. No scleral icterus. Neck supple.  Cardiovascular: Normal rate, regular rhythm.  Pulmonary/chest: Effort normal, slightly decreased BS B. No wheezing, rales or rhonchi. Abdominal: Soft, nondistended, nontender. Bowel sounds active throughout. There are no masses palpable. No hepatomegaly. Extremities: no edema Lymphadenopathy: No cervical adenopathy noted. Neurological: Alert and oriented to person place and time. Skin: Skin is warm and dry. No rashes noted. Psychiatric: Normal mood and affect. Behavior is normal.   ASSESSMENT AND PLAN: 60 y/o female with history of oxygen dependant COPD referred for a positive FOBT. She has no overt GI bleeding or lower GI tract symptoms other than stable mild constipation. Her sister has stage IV colon cancer. The patient has a history of adenomatous polyps, but her last exam in 2012 was normal. Last CBC showed mild anemia but from 2014, don't have results of more recent blood work today. I discussed the positive FOBT with her, and the differential of what this could represent. While she has a family history of colon cancer and is concerned about this, I think it is unlikely she has a colon cancer given her reportedly normal exam in 2012, unless a lesion was missed. The test result could be related otherwise to the interval development of a colon polyp or hemorrhoids, etc. I discussed colonoscopy with her and the risks / benefits in light of her oxygen dependant COPD. She reports COPD Is stable. I offered her a colonoscopy to evaluate this positive stool test and the patient wished  to proceed with it following our discussion below. Her case will be done at [facility] given her oxygen requirement.   The indications, risks, and benefits of colonoscopy were explained to the patient in detail. Risks include but are not limited to bleeding, perforation, adverse reaction to medications, and cardiopulmonary compromise. Sequelae include but are not limited to the possibility of surgery, hospitalization, and mortality. The patient verbalized understanding and wished to proceed. All questions answered, referred to the scheduler and bowel prep ordered. Further recommendations pending results of the exam.   [place] Cellar, MD [facility] Gastroenterology Pager [phone]   CC: Dr. Leanna Battles       No interval changes

## 2015-08-27 NOTE — Interval H&P Note (Signed)
History and Physical Interval Note:  08/27/2015 11:18 AM  Katie Greene  has presented today for surgery, with the diagnosis of screening colon, patient on oxygen   The various methods of treatment have been discussed with the patient and family. After consideration of risks, benefits and other options for treatment, the patient has consented to  Procedure(s): COLONOSCOPY (N/A) as a surgical intervention .  The patient's history has been reviewed, patient examined, no change in status, stable for surgery.  I have reviewed the patient's chart and labs.  Questions were answered to the patient's satisfaction.     Renelda Loma Katalaya Beel

## 2015-08-27 NOTE — Discharge Instructions (Signed)

## 2015-08-27 NOTE — Op Note (Signed)
[facility] [place], [address]   COLONOSCOPY PROCEDURE REPORT  PATIENT: Candyce, Alcantara  MR#: [id number] BIRTHDATE: 1956-04-03 , 59  yrs. old GENDER: female ENDOSCOPIST: Yetta Flock, MD REFERRED BY: PROCEDURE DATE:  08/27/2015 PROCEDURE:   Colonoscopy, diagnostic, Colonoscopy with snare polypectomy, and Colonoscopy with biopsy First Screening Colonoscopy - Avg.  risk and is 50 yrs.  old or older - No.  Prior Negative Screening - Now for repeat screening. N/A  History of Adenoma - Now for follow-up colonoscopy & has been > or = to 3 yrs.  Yes hx of adenoma.  Has been 3 or more years since last colonoscopy.  Polyps removed today? Yes ASA CLASS:   Class III INDICATIONS:positive FOBT, history of adenoma and Colorectal Neoplasm Risk Assessment for this procedure is average risk. MEDICATIONS: Per Anesthesia  DESCRIPTION OF PROCEDURE:   After the risks benefits and alternatives of the procedure were thoroughly explained, informed consent was obtained.  The digital rectal exam revealed no abnormalities of the rectum.   The Pentax Ped Colon [id number] endoscope was introduced through the anus and advanced to the cecum, which was identified by both the appendix and ileocecal valve. No adverse events experienced.   The quality of the prep was adequate  The instrument was then slowly withdrawn as the colon was fully examined. Estimated blood loss is zero unless otherwise noted in this procedure report.   COLON FINDINGS: There was a sessile polyp in the cecum along the back of the IC valve, unclear if this was adenomatous tissue or perhaps IC valve tissue.  It was only a few mm wide but upwards of 8-50mm long.  It was removed with cold snare.  A lipoma was noted in the ascending colon, however along its mucosa was a roughly 110mm sessile polyp.  This was removed with cold forceps.  Another 7-77mm sessile polyp was noted in the transverse colon and  removed via cold snare.  The colon was extremely tortous making for a challenging cecal intubation.  The remainder of the colon was normal.  The ileum was normal.  Retroflexed views revealed internal hemorrhoids. The time to cecum = 8.1 Withdrawal time = 23.0   The scope was withdrawn and the procedure completed. COMPLICATIONS: There were no immediate complications.  ENDOSCOPIC IMPRESSION: 3 colon polyps removed as outlined above Tortous colon Lipoma of the ascending colon Normal ileum and remainder of examined colon Internal hemorrhoids which are the likely cause of the patient's FOBT  RECOMMENDATIONS: Await pathology results Resume diet Resume medications No NSAIDs for 2 weeks  eSigned:  Yetta Flock, MD 08/27/2015 12:58 PM   cc:  the patient   PATIENT NAME:  Mariaelizabeth, Szczesniak MR#: [id number]

## 2015-08-27 NOTE — Interval H&P Note (Signed)
History and Physical Interval Note:  08/27/2015 11:19 AM  Katie Greene  has presented today for surgery, with the diagnosis of screening colon, patient on oxygen   The various methods of treatment have been discussed with the patient and family. After consideration of risks, benefits and other options for treatment, the patient has consented to  Procedure(s): COLONOSCOPY (N/A) as a surgical intervention .  The patient's history has been reviewed, patient examined, no change in status, stable for surgery.  I have reviewed the patient's chart and labs.  Questions were answered to the patient's satisfaction.     Renelda Loma Tovia Kisner

## 2015-08-28 ENCOUNTER — Encounter (HOSPITAL_COMMUNITY): Payer: Self-pay | Admitting: Gastroenterology

## 2015-08-29 ENCOUNTER — Encounter (HOSPITAL_COMMUNITY): Payer: Self-pay

## 2015-09-03 ENCOUNTER — Encounter (HOSPITAL_COMMUNITY)
Admission: RE | Admit: 2015-09-03 | Discharge: 2015-09-03 | Disposition: A | Discharge status: Home / Self Care | Payer: Self-pay | Source: Ambulatory Visit | Attending: Internal Medicine | Admitting: Internal Medicine

## 2015-09-05 ENCOUNTER — Encounter (HOSPITAL_COMMUNITY)
Admission: RE | Admit: 2015-09-05 | Discharge: 2015-09-05 | Disposition: A | Discharge status: Home / Self Care | Payer: Self-pay | Source: Ambulatory Visit | Attending: Internal Medicine | Admitting: Internal Medicine

## 2015-09-05 DIAGNOSIS — J42 Unspecified chronic bronchitis: Secondary | ICD-10-CM | POA: Insufficient documentation

## 2015-09-07 DIAGNOSIS — J449 Chronic obstructive pulmonary disease, unspecified: Secondary | ICD-10-CM | POA: Diagnosis not present

## 2015-09-09 ENCOUNTER — Other Ambulatory Visit: Payer: Self-pay | Admitting: Emergency Medicine

## 2015-09-09 MED ORDER — FLUTICASONE PROPIONATE 50 MCG/ACT NA SUSP: 2.0000 | Freq: Every day | NASAL | Status: DC

## 2015-09-10 ENCOUNTER — Encounter (HOSPITAL_COMMUNITY)
Admission: RE | Admit: 2015-09-10 | Discharge: 2015-09-10 | Disposition: A | Discharge status: Home / Self Care | Payer: Self-pay | Source: Ambulatory Visit | Attending: Internal Medicine | Admitting: Internal Medicine

## 2015-09-12 ENCOUNTER — Encounter (HOSPITAL_COMMUNITY)
Admission: RE | Admit: 2015-09-12 | Discharge: 2015-09-12 | Disposition: A | Discharge status: Home / Self Care | Payer: Self-pay | Source: Ambulatory Visit | Attending: Internal Medicine | Admitting: Internal Medicine

## 2015-09-16 ENCOUNTER — Ambulatory Visit (INDEPENDENT_AMBULATORY_CARE_PROVIDER_SITE_OTHER): Payer: Commercial Managed Care - HMO | Admitting: Internal Medicine

## 2015-09-16 ENCOUNTER — Encounter: Payer: Self-pay | Admitting: Internal Medicine

## 2015-09-16 VITALS — BP 124/68 | HR 99 | Ht 67.0 in | Wt 189.0 lb

## 2015-09-16 DIAGNOSIS — J9611 Chronic respiratory failure with hypoxia: Secondary | ICD-10-CM | POA: Diagnosis not present

## 2015-09-16 DIAGNOSIS — J441 Chronic obstructive pulmonary disease with (acute) exacerbation: Secondary | ICD-10-CM

## 2015-09-16 DIAGNOSIS — J449 Chronic obstructive pulmonary disease, unspecified: Secondary | ICD-10-CM

## 2015-09-16 MED ORDER — DOXYCYCLINE HYCLATE 100 MG PO TABS: ORAL_TABLET | ORAL | Status: DC

## 2015-09-16 NOTE — Patient Instructions (Signed)
ICD-9-CM ICD-10-CM   1. Chronic respiratory failure with hypoxia (HCC) 518.83 J96.11    799.02    2. COPD, severe ([place]) 496 J44.9   3. COPD exacerbation (HCC) 491.21 J44.1    Take doxycycline 100mg  po twice daily x 5 days; take after meals and avoid sunlight Continue your inhalers and oxygen If things get worse than please call us and we will call in a prednisone or go to the emergency room Appreciated to interested in research trials - if we offer 1 we will let you know  Follow-up - 3 months or sooner if needed

## 2015-09-16 NOTE — Addendum Note (Signed)
Addended by: Collier Salina on: 09/16/2015 04:54 PM   Modules accepted: Orders

## 2015-09-16 NOTE — Progress Notes (Signed)
Subjective:     Patient ID: Katie Greene, female   DOB: Jul 04, 1956, 60 y.o.   MRN: [id number]  HPI    OV 09/16/2015  Chief Complaint  Patient presents with  . Follow-up    pt. states breathing is up & down. increased SOB. prod. cough white to light green in color. occ. wheezing. denies chest pain/tightness.   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 Overall she is doing well. On deeper questioning she tells me that since yesterday she is having slightly more wheezing than usual, more chest tightness unusual and a slight change in sputum to green color but this is not reflected as a fever or worsening shortness of breath or feeling unwell. She does admit that she might be an early stages of a COPD flareup or this might just be a down day.  Other relevant history: Her sister stage IV ovarian cancer and is in a nursing home. 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 In addition she's had colonoscopy and apparently some precancerous polyps were removed. This is on a surveillance mode   Current outpatient prescriptions:  .  albuterol (VENTOLIN HFA) 108 (90 BASE) MCG/ACT inhaler, Inhale 2 puffs into the lungs every 6 (six) hours as needed. (Patient taking differently: Inhale 2 puffs into the lungs every 6 (six) hours as needed for wheezing or shortness of breath. ), Disp: 1 Inhaler, Rfl: 5 .  ALPRAZolam (XANAX) 0.5 MG tablet, Take 0.5 mg by mouth 3 (three) times daily as needed for anxiety. , Disp: , Rfl:  .  aspirin EC 81 MG tablet, Take 81 mg by mouth every morning. , Disp: , Rfl:  .  budesonide-formoterol (SYMBICORT) 160-4.5 MCG/ACT inhaler, Inhale 2 puffs into the lungs 2 (two) times daily., Disp: 1 Inhaler, Rfl: 6 .  buPROPion (WELLBUTRIN SR) 100 MG 12 hr tablet, Take 100 mg by mouth every morning. , Disp: , Rfl:  .  cetirizine (ZYRTEC) 10 MG tablet, Take 10 mg by mouth at bedtime., Disp: , Rfl:  .   cholecalciferol (VITAMIN D) 1000 UNITS tablet, Take 1,000 Units by mouth daily., Disp: , Rfl:  .  diltiazem (CARDIZEM CD) 180 MG 24 hr capsule, Take 180 mg by mouth every morning. , Disp: , Rfl:  .  ferrous sulfate 325 (65 FE) MG tablet, Take 325 mg by mouth daily with breakfast., Disp: , Rfl:  .  fluticasone (FLONASE) 50 MCG/ACT nasal spray, Place 2 sprays into both nostrils daily., Disp: 16 g, Rfl: 5 .  hydrochlorothiazide (HYDRODIURIL) 25 MG tablet, Take 25 mg by mouth every morning. , Disp: , Rfl:  .  meloxicam (MOBIC) 15 MG tablet, Take 15 mg by mouth daily., Disp: , Rfl:  .  metoprolol succinate (TOPROL-XL) 50 MG 24 hr tablet, Take 50 mg by mouth every morning. Take with or immediately following a meal., Disp: , Rfl:  .  Na Sulfate-K Sulfate-Mg Sulf SOLN, Take as directed per Colonoscopy. (Patient taking differently: Take 1 kit by mouth once. Take as directed per Colonoscopy.), Disp: 354 mL, Rfl: 0 .  omeprazole (PRILOSEC) 20 MG capsule, Take 20 mg by mouth daily. , Disp: , Rfl:  .  OXYGEN, Inhale 2-3 L into the lungs daily., Disp: , Rfl:  .  pravastatin (PRAVACHOL) 80 MG tablet, Take 80 mg by mouth every morning. , Disp: , Rfl:  .  SPIRIVA HANDIHALER 18 MCG inhalation capsule, PLACE 1  CAPSULE INTO THE INHALER AND INHALE THE CONTENTS OF IT AS INSTRUCTED EVERY DAY , Disp: 30 capsule, Rfl: 2 .  spironolactone (ALDACTONE) 50 MG tablet, Take 25 mg by mouth every morning. , Disp: , Rfl:  .  temazepam (RESTORIL) 30 MG capsule, Take 30 mg by mouth at bedtime as needed for sleep. , Disp: , Rfl:    No Known Allergies  Immunization History  Administered Date(s) Administered  . Influenza Split 06/10/2012, 04/03/2014, 04/20/2015  . Influenza,inj,Quad PF,36+ Mos 04/24/2013  . Pneumococcal Conjugate-13 05/07/2015  . Pneumococcal Polysaccharide-23 06/10/2012  . Zoster 05/10/2014  \   Review of Systems Per hpi    Objective:   Physical Exam Filed Vitals:   09/16/15 1623  BP: 124/68  Pulse:  99  Height: 5' 7"  (1.702 m)  Weight: 189 lb (85.73 kg)  SpO2: 93%   Gen. exam Looks well Neurologic: Alert and oriented 3. Speech normal. Gait normal. HEENT: Oxygen on. Mild postnasal drip present. No thrush Respiratory: Clear to auscultation bilaterally overall air entry is diminished Cardiovascular: Normal heart sounds Abdomen: Obese soft nontender Extremity: No cyanosis no clubbing no edema Skin: Intact in the exposed areas.     Assessment:       ICD-9-CM ICD-10-CM   1. Chronic respiratory failure with hypoxia (HCC) 518.83 J96.11    799.02    2. 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 COPD exacerbation ([place]) 491.21 J44.1        Plan:      Take doxycycline 169m po twice daily x 5 days; take after meals and avoid sunlight Continue your inhalers and oxygen If things get worse than please call [place] we will call in a prednisone or go to the emergency room Appreciated to interested in research trials - if we offer 1 we will let you know  Follow-up - 3 months or sooner if needed    Dr. MBrand Males M.D., FSt Joseph'S Medical CenterC.P Pulmonary and Critical Care Medicine Staff Physician [place]Pulmonary and Critical Care Pager: [phone] If no answer or between  15:00h - 7:00h: call [phone]  09/16/2015 4:46 PM

## 2015-09-17 ENCOUNTER — Encounter (HOSPITAL_COMMUNITY)
Admission: RE | Admit: 2015-09-17 | Discharge: 2015-09-17 | Disposition: A | Discharge status: Home / Self Care | Payer: Self-pay | Source: Ambulatory Visit | Attending: Internal Medicine | Admitting: Internal Medicine

## 2015-09-19 ENCOUNTER — Encounter (HOSPITAL_COMMUNITY): Payer: Self-pay

## 2015-09-24 ENCOUNTER — Encounter (HOSPITAL_COMMUNITY)
Admission: RE | Admit: 2015-09-24 | Discharge: 2015-09-24 | Disposition: A | Discharge status: Home / Self Care | Payer: Self-pay | Source: Ambulatory Visit | Attending: Internal Medicine | Admitting: Internal Medicine

## 2015-09-26 ENCOUNTER — Encounter (HOSPITAL_COMMUNITY): Payer: Self-pay

## 2015-09-26 DIAGNOSIS — Z9981 Dependence on supplemental oxygen: Secondary | ICD-10-CM | POA: Diagnosis not present

## 2015-09-26 DIAGNOSIS — J438 Other emphysema: Secondary | ICD-10-CM | POA: Diagnosis not present

## 2015-09-26 DIAGNOSIS — R0609 Other forms of dyspnea: Secondary | ICD-10-CM | POA: Diagnosis not present

## 2015-09-26 DIAGNOSIS — E784 Other hyperlipidemia: Secondary | ICD-10-CM | POA: Diagnosis not present

## 2015-09-26 DIAGNOSIS — I10 Essential (primary) hypertension: Secondary | ICD-10-CM | POA: Diagnosis not present

## 2015-09-26 DIAGNOSIS — F329 Major depressive disorder, single episode, unspecified: Secondary | ICD-10-CM | POA: Diagnosis not present

## 2015-09-26 DIAGNOSIS — Z6830 Body mass index (BMI) 30.0-30.9, adult: Secondary | ICD-10-CM | POA: Diagnosis not present

## 2015-10-01 ENCOUNTER — Encounter (HOSPITAL_COMMUNITY)
Admission: RE | Admit: 2015-10-01 | Discharge: 2015-10-01 | Disposition: A | Discharge status: Home / Self Care | Payer: Self-pay | Source: Ambulatory Visit | Attending: Internal Medicine | Admitting: Internal Medicine

## 2015-10-03 ENCOUNTER — Encounter (HOSPITAL_COMMUNITY)
Admission: RE | Admit: 2015-10-03 | Discharge: 2015-10-03 | Disposition: A | Discharge status: Home / Self Care | Payer: Self-pay | Source: Ambulatory Visit | Attending: Internal Medicine | Admitting: Internal Medicine

## 2015-10-03 DIAGNOSIS — J42 Unspecified chronic bronchitis: Secondary | ICD-10-CM | POA: Insufficient documentation

## 2015-10-05 DIAGNOSIS — J449 Chronic obstructive pulmonary disease, unspecified: Secondary | ICD-10-CM | POA: Diagnosis not present

## 2015-10-08 ENCOUNTER — Encounter (HOSPITAL_COMMUNITY)
Admission: RE | Admit: 2015-10-08 | Discharge: 2015-10-08 | Disposition: A | Discharge status: Home / Self Care | Payer: Self-pay | Source: Ambulatory Visit | Attending: Internal Medicine | Admitting: Internal Medicine

## 2015-10-10 ENCOUNTER — Encounter (HOSPITAL_COMMUNITY)
Admission: RE | Admit: 2015-10-10 | Discharge: 2015-10-10 | Disposition: A | Discharge status: Home / Self Care | Payer: Self-pay | Source: Ambulatory Visit | Attending: Internal Medicine | Admitting: Internal Medicine

## 2015-10-15 ENCOUNTER — Encounter (HOSPITAL_COMMUNITY)
Admission: RE | Admit: 2015-10-15 | Discharge: 2015-10-15 | Disposition: A | Discharge status: Home / Self Care | Payer: Self-pay | Source: Ambulatory Visit | Attending: Internal Medicine | Admitting: Internal Medicine

## 2015-10-17 ENCOUNTER — Encounter (HOSPITAL_COMMUNITY): Payer: Self-pay

## 2015-10-22 ENCOUNTER — Encounter (HOSPITAL_COMMUNITY)
Admission: RE | Admit: 2015-10-22 | Discharge: 2015-10-22 | Disposition: A | Discharge status: Home / Self Care | Payer: Self-pay | Source: Ambulatory Visit | Attending: Internal Medicine | Admitting: Internal Medicine

## 2015-10-22 ENCOUNTER — Other Ambulatory Visit: Payer: Self-pay | Admitting: Acute Care

## 2015-10-22 DIAGNOSIS — Z87891 Personal history of nicotine dependence: Secondary | ICD-10-CM

## 2015-10-22 NOTE — Progress Notes (Signed)
Katie Greene checked into Pulmonary Maintenance Program today with HR 133 resting. She did not report this. Her HR at exit was 128. During exercise she had her highest HR documented at 130. We noted her discharge HR. Katie Greene has had difficulty with HR control at intervals. Encouraged her to call her cardiologist Dr. Einar Gip and get an appointment. The last time she seen him was 7/15. After she rested HR was 122 at discharge. Katie Greene denies any symptoms with increased HR. We have her max target HR at 127.

## 2015-10-24 ENCOUNTER — Encounter (HOSPITAL_COMMUNITY)
Admission: RE | Admit: 2015-10-24 | Discharge: 2015-10-24 | Disposition: A | Discharge status: Home / Self Care | Payer: Self-pay | Source: Ambulatory Visit | Attending: Internal Medicine | Admitting: Internal Medicine

## 2015-10-28 DIAGNOSIS — R0602 Shortness of breath: Secondary | ICD-10-CM | POA: Diagnosis not present

## 2015-10-28 DIAGNOSIS — I2781 Cor pulmonale (chronic): Secondary | ICD-10-CM | POA: Diagnosis not present

## 2015-10-28 DIAGNOSIS — I10 Essential (primary) hypertension: Secondary | ICD-10-CM | POA: Diagnosis not present

## 2015-10-28 DIAGNOSIS — R000 Tachycardia, unspecified: Secondary | ICD-10-CM | POA: Diagnosis not present

## 2015-10-29 ENCOUNTER — Encounter (HOSPITAL_COMMUNITY)
Admission: RE | Admit: 2015-10-29 | Discharge: 2015-10-29 | Disposition: A | Discharge status: Home / Self Care | Payer: Self-pay | Source: Ambulatory Visit | Attending: Internal Medicine | Admitting: Internal Medicine

## 2015-10-31 ENCOUNTER — Encounter (HOSPITAL_COMMUNITY): Payer: Self-pay

## 2015-11-05 ENCOUNTER — Encounter (HOSPITAL_COMMUNITY): Payer: Self-pay

## 2015-11-05 DIAGNOSIS — J42 Unspecified chronic bronchitis: Secondary | ICD-10-CM | POA: Insufficient documentation

## 2015-11-05 DIAGNOSIS — J449 Chronic obstructive pulmonary disease, unspecified: Secondary | ICD-10-CM | POA: Diagnosis not present

## 2015-11-07 ENCOUNTER — Encounter (HOSPITAL_COMMUNITY)
Admission: RE | Admit: 2015-11-07 | Discharge: 2015-11-07 | Disposition: A | Discharge status: Home / Self Care | Payer: Self-pay | Source: Ambulatory Visit | Attending: Internal Medicine | Admitting: Internal Medicine

## 2015-11-12 ENCOUNTER — Encounter (HOSPITAL_COMMUNITY): Payer: Self-pay

## 2015-11-14 ENCOUNTER — Encounter (HOSPITAL_COMMUNITY): Payer: Self-pay

## 2015-11-19 ENCOUNTER — Encounter (HOSPITAL_COMMUNITY)
Admission: RE | Admit: 2015-11-19 | Discharge: 2015-11-19 | Disposition: A | Discharge status: Home / Self Care | Payer: Self-pay | Source: Ambulatory Visit | Attending: Internal Medicine | Admitting: Internal Medicine

## 2015-11-21 ENCOUNTER — Encounter (HOSPITAL_COMMUNITY): Payer: Self-pay

## 2015-11-26 ENCOUNTER — Encounter (HOSPITAL_COMMUNITY): Payer: Self-pay

## 2015-11-28 ENCOUNTER — Encounter (HOSPITAL_COMMUNITY): Payer: Self-pay

## 2015-12-03 ENCOUNTER — Encounter (HOSPITAL_COMMUNITY): Payer: Self-pay

## 2015-12-03 DIAGNOSIS — J42 Unspecified chronic bronchitis: Secondary | ICD-10-CM | POA: Insufficient documentation

## 2015-12-05 ENCOUNTER — Encounter (HOSPITAL_COMMUNITY): Payer: Self-pay

## 2015-12-05 DIAGNOSIS — J449 Chronic obstructive pulmonary disease, unspecified: Secondary | ICD-10-CM | POA: Diagnosis not present

## 2015-12-06 ENCOUNTER — Telehealth: Payer: Self-pay | Admitting: Internal Medicine

## 2015-12-06 MED ORDER — ALBUTEROL SULFATE HFA 108 (90 BASE) MCG/ACT IN AERS: 2.0000 | INHALATION_SPRAY | Freq: Four times a day (QID) | RESPIRATORY_TRACT | Status: DC | PRN

## 2015-12-06 NOTE — Telephone Encounter (Signed)
Spoke with pt and she states that her albuterol HFA has cracked and cannot be used. She has tried to tape it together but it's not working. Pt is requesting a refill be sent to the local pharmacy. Rx sent.

## 2015-12-10 ENCOUNTER — Encounter (HOSPITAL_COMMUNITY): Payer: Self-pay

## 2015-12-12 ENCOUNTER — Encounter (HOSPITAL_COMMUNITY): Payer: Self-pay

## 2015-12-17 ENCOUNTER — Encounter (HOSPITAL_COMMUNITY): Payer: Self-pay

## 2015-12-19 ENCOUNTER — Encounter (HOSPITAL_COMMUNITY): Payer: Self-pay

## 2015-12-19 ENCOUNTER — Encounter: Payer: Self-pay | Admitting: Internal Medicine

## 2015-12-19 ENCOUNTER — Ambulatory Visit (INDEPENDENT_AMBULATORY_CARE_PROVIDER_SITE_OTHER): Payer: 59 | Admitting: Internal Medicine

## 2015-12-19 VITALS — BP 108/62 | HR 92 | Ht 67.0 in | Wt 182.0 lb

## 2015-12-19 DIAGNOSIS — J441 Chronic obstructive pulmonary disease with (acute) exacerbation: Secondary | ICD-10-CM | POA: Diagnosis not present

## 2015-12-19 DIAGNOSIS — J9611 Chronic respiratory failure with hypoxia: Secondary | ICD-10-CM | POA: Diagnosis not present

## 2015-12-19 DIAGNOSIS — J449 Chronic obstructive pulmonary disease, unspecified: Secondary | ICD-10-CM | POA: Diagnosis not present

## 2015-12-19 MED ORDER — DOXYCYCLINE HYCLATE 100 MG PO TABS: 100.0000 mg | ORAL_TABLET | Freq: Two times a day (BID) | ORAL | Status: AC

## 2015-12-19 NOTE — Progress Notes (Signed)
Subjective:     Patient ID: Katie Greene, female   DOB: 1955-10-17, 60 y.o.   MRN: [id number]  HPI  OV 12/19/2015  Chief Complaint  Patient presents with  . Follow-up    Pt recently lost her sister and feels that her health has been deteriorating since then. Pt c/o occasional wheeze, cough with light yellow/green mucus, and some increase in SOB. Pt is on 3L pulsed continually.     Gold sage IV COPD  And chronic hypoxemic respiratory faluefollow-up. oveall CoPDstable but for the last 3 days she hsyellow green putum he did she feels she  Need an antiiotic.She feels that the sputum changes color periodically and quite frequentlyShe's never had sputum culture tested.Other than that COPD sable.The  Main ssu s social. 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   reports that she quit smoking about 3 years ago. Her smoking use included Cigarettes. She has a 12.5 pack-year smoking history. She has never used smokeless tobacco.  Past Surgical History  Procedure Laterality Date  . Oopherectomy      1 ovary removed only  . Salpingectomy      1 fallopian tube removed  . Colonoscopy      x2 with polyps removed  . Polypectomy    . Cholecystectomy      laparosopic attempted, then had to open  . Colonoscopy N/A 08/27/2015    Procedure: COLONOSCOPY;  Surgeon: Manus Gunning, MD;  Location: [facility];  Service: Gastroenterology;  Laterality: N/A;    No Known Allergies  Immunization History  Administered Date(s) Administered  . Influenza Split 06/10/2012, 04/03/2014, 04/20/2015  . Influenza,inj,Quad PF,36+ Mos 04/24/2013  . Pneumococcal Conjugate-13 05/07/2015  .  Pneumococcal Polysaccharide-23 06/10/2012  . Zoster 05/10/2014    Family History  Problem Relation Age of Onset  . Heart attack Mother   . Pancreatic cancer Mother   . Prostate cancer Father   . Hypertension Father   . Lung cancer Father   . Throat cancer Brother     Throat Cancer  . Colon cancer Sister      Current outpatient prescriptions:  .  albuterol (VENTOLIN HFA) 108 (90 Base) MCG/ACT inhaler, Inhale 2 puffs into the lungs every 6 (six) hours as needed for wheezing or shortness of breath., Disp: 1 Inhaler, Rfl: 5 .  ALPRAZolam (XANAX) 0.5 MG tablet, Take 0.5 mg by mouth 3 (three) times daily as needed for anxiety. , Disp: , Rfl:  .  aspirin EC 81 MG tablet, Take 81 mg by mouth every morning. , Disp: , Rfl:  .  budesonide-formoterol (SYMBICORT) 160-4.5 MCG/ACT inhaler, Inhale 2 puffs into the lungs 2 (two) times daily., Disp: 1 Inhaler, Rfl: 6 .  buPROPion (WELLBUTRIN SR) 100 MG 12 hr tablet, Take 100 mg by mouth every morning. , Disp: , Rfl:  .  cetirizine (ZYRTEC) 10 MG tablet, Take 10 mg by mouth at bedtime., Disp: , Rfl:  .  cholecalciferol (VITAMIN D) 1000 UNITS tablet, Take 1,000 Units by mouth daily., Disp: , Rfl:  .  diltiazem (CARDIZEM CD) 180 MG 24 hr capsule, Take 180 mg by mouth every morning. , Disp: , Rfl:  .  doxycycline (VIBRA-TABS) 100 MG tablet, Take 1 tablet,  twice daily for 5 days. Avoid sunlight and take after meals., Disp: 10 tablet, Rfl: 0 .  ferrous sulfate 325 (65 FE) MG tablet, Take 325 mg by mouth daily with breakfast., Disp: , Rfl:  .  fluticasone (FLONASE) 50 MCG/ACT nasal spray, Place 2 sprays into both nostrils daily., Disp: 16 g, Rfl: 5 .  hydrochlorothiazide (HYDRODIURIL) 25 MG tablet, Take 25 mg by mouth every morning. , Disp: , Rfl:  .  meloxicam (MOBIC) 15 MG tablet, Take 15 mg by mouth daily., Disp: , Rfl:  .  metoprolol succinate (TOPROL-XL) 50 MG 24 hr tablet, Take 50 mg by mouth every morning. Take with or immediately following a meal.,  Disp: , Rfl:  .  omeprazole (PRILOSEC) 20 MG capsule, Take 20 mg by mouth daily. , Disp: , Rfl:  .  OXYGEN, Inhale 2-3 L into the lungs daily., Disp: , Rfl:  .  pravastatin (PRAVACHOL) 80 MG tablet, Take 80 mg by mouth every morning. , Disp: , Rfl:  .  SPIRIVA HANDIHALER 18 MCG inhalation capsule, PLACE 1 CAPSULE INTO THE INHALER AND INHALE THE CONTENTS OF IT AS INSTRUCTED EVERY DAY , Disp: 30 capsule, Rfl: 2 .  spironolactone (ALDACTONE) 50 MG tablet, Take 25 mg by mouth every morning. , Disp: , Rfl:  .  temazepam (RESTORIL) 30 MG capsule, Take 30 mg by mouth at bedtime as needed for sleep. , Disp: , Rfl:      Review of Systems     Objective:   Physical Exam  Constitutional: She is oriented to person, place, and time. She appears well-developed and well-nourished. No distress.  HENT:  Head: Normocephalic and atraumatic.  Right Ear: External ear normal.  Left Ear: External ear normal.  Mouth/Throat: Oropharynx is clear and moist. No oropharyngeal exudate.  o2 on  Eyes: Conjunctivae and EOM are normal. Pupils are equal, round, and reactive to light. Right eye exhibits no discharge. Left eye exhibits no discharge. No scleral icterus.  Neck: Normal range of motion. Neck supple. No JVD present. No tracheal deviation present. No thyromegaly present.  Cardiovascular: Normal rate, regular rhythm, normal heart sounds and intact distal pulses.  Exam reveals no gallop and no friction rub.   No murmur heard. Pulmonary/Chest: Effort normal and breath sounds normal. No respiratory distress. She has no wheezes. She has no rales. She exhibits no tenderness.  Purse lip breathing  Abdominal: Soft. Bowel sounds are normal. She exhibits no distension and no mass. There is no tenderness. There is no rebound and no guarding.  Musculoskeletal: Normal range of motion. She exhibits no edema or tenderness.  Lymphadenopathy:    She has no cervical adenopathy.  Neurological: She is alert and oriented to  person, place, and time. She has normal reflexes. No cranial nerve deficit. She exhibits normal muscle tone. Coordination normal.  Skin: Skin is warm and dry. No rash noted. She is not diaphoretic. No erythema. No pallor.  Psychiatric: She has a normal mood and affect. Her behavior is normal. Judgment and thought content normal.  Vitals reviewed.   Filed Vitals:   12/19/15 1150  BP: 108/62  Pulse: 92  Height: 5\' 7"  (1.702 m)  Weight: 182 lb (82.555 kg)  SpO2: 99%        Assessment:       ICD-9-CM ICD-10-CM   1. Chronic respiratory failure with hypoxia (HCC) 518.83 J96.11    799.02    2. 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 COPD exacerbation ([place]) 491.21 J44.1  Plan:      There is a mild exacerbation Give sputum for gram stain and culture Take doxycycline 100mg  po twice daily x 5 days; take after meals and avoid sunlight Continue oxygen and other copd meds as before There are no current research studies   Followup  6 weeks or sooner esp given recent grief and social issues      Dr. Brand Males, M.D., [facility].C.P Pulmonary and Critical Care Medicine Staff Physician [place] [facility]monary and Critical [facility] Pager: [phone], If no answer or between  15:00h - 7:00h: call [phone]  12/19/2015 12:09 PM

## 2015-12-19 NOTE — Patient Instructions (Signed)
ICD-9-CM ICD-10-CM   1. Chronic respiratory failure with hypoxia (HCC) 518.83 J96.11    799.02    2. 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 COPD exacerbation (HCC) 491.21 J44.1     There is a mild exacerbation Give sputum for gram stain and culture Take doxycycline 100mg  po twice daily x 5 days; take after meals and avoid sunlight Continue oxygen and other copd meds as before There are no current research studies   Followup  6 weeks or sooner esp given recent grief and social issues

## 2015-12-24 ENCOUNTER — Other Ambulatory Visit: Payer: Self-pay | Admitting: Internal Medicine

## 2015-12-24 ENCOUNTER — Encounter (HOSPITAL_COMMUNITY)
Admission: RE | Admit: 2015-12-24 | Discharge: 2015-12-24 | Disposition: A | Discharge status: Home / Self Care | Payer: Self-pay | Source: Ambulatory Visit | Attending: Internal Medicine | Admitting: Internal Medicine

## 2015-12-24 ENCOUNTER — Other Ambulatory Visit: Payer: 59

## 2015-12-24 DIAGNOSIS — J449 Chronic obstructive pulmonary disease, unspecified: Secondary | ICD-10-CM | POA: Diagnosis not present

## 2015-12-26 ENCOUNTER — Encounter (HOSPITAL_COMMUNITY)
Admission: RE | Admit: 2015-12-26 | Discharge: 2015-12-26 | Disposition: A | Discharge status: Home / Self Care | Payer: Self-pay | Source: Ambulatory Visit | Attending: Internal Medicine | Admitting: Internal Medicine

## 2015-12-27 LAB — RESPIRATORY CULTURE OR RESPIRATORY AND SPUTUM CULTURE

## 2015-12-31 ENCOUNTER — Encounter (HOSPITAL_COMMUNITY)
Admission: RE | Admit: 2015-12-31 | Discharge: 2015-12-31 | Disposition: A | Discharge status: Home / Self Care | Payer: Self-pay | Source: Ambulatory Visit | Attending: Internal Medicine | Admitting: Internal Medicine

## 2016-01-02 ENCOUNTER — Encounter (HOSPITAL_COMMUNITY)
Admission: RE | Admit: 2016-01-02 | Discharge: 2016-01-02 | Disposition: A | Discharge status: Home / Self Care | Payer: Self-pay | Source: Ambulatory Visit | Attending: Internal Medicine | Admitting: Internal Medicine

## 2016-01-02 DIAGNOSIS — J42 Unspecified chronic bronchitis: Secondary | ICD-10-CM | POA: Insufficient documentation

## 2016-01-03 ENCOUNTER — Telehealth: Payer: Self-pay | Admitting: Internal Medicine

## 2016-01-03 NOTE — Telephone Encounter (Signed)
Patient calling to get results from sputum culture. Have not received results yet. Advised patient that we would contact her as soon as we have the results. Nothing further needed.

## 2016-01-05 DIAGNOSIS — J449 Chronic obstructive pulmonary disease, unspecified: Secondary | ICD-10-CM | POA: Diagnosis not present

## 2016-01-07 ENCOUNTER — Encounter (HOSPITAL_COMMUNITY): Payer: Self-pay

## 2016-01-09 ENCOUNTER — Encounter (HOSPITAL_COMMUNITY): Payer: Self-pay

## 2016-01-09 DIAGNOSIS — I10 Essential (primary) hypertension: Secondary | ICD-10-CM | POA: Diagnosis not present

## 2016-01-09 DIAGNOSIS — R1030 Lower abdominal pain, unspecified: Secondary | ICD-10-CM | POA: Diagnosis not present

## 2016-01-09 DIAGNOSIS — Z9981 Dependence on supplemental oxygen: Secondary | ICD-10-CM | POA: Diagnosis not present

## 2016-01-09 DIAGNOSIS — K5909 Other constipation: Secondary | ICD-10-CM | POA: Diagnosis not present

## 2016-01-09 DIAGNOSIS — R102 Pelvic and perineal pain: Secondary | ICD-10-CM | POA: Diagnosis not present

## 2016-01-09 DIAGNOSIS — Z6829 Body mass index (BMI) 29.0-29.9, adult: Secondary | ICD-10-CM | POA: Diagnosis not present

## 2016-01-14 ENCOUNTER — Encounter (HOSPITAL_COMMUNITY)
Admission: RE | Admit: 2016-01-14 | Discharge: 2016-01-14 | Disposition: A | Discharge status: Home / Self Care | Payer: Self-pay | Source: Ambulatory Visit | Attending: Internal Medicine | Admitting: Internal Medicine

## 2016-01-16 ENCOUNTER — Encounter (HOSPITAL_COMMUNITY)
Admission: RE | Admit: 2016-01-16 | Discharge: 2016-01-16 | Disposition: A | Discharge status: Home / Self Care | Payer: Self-pay | Source: Ambulatory Visit | Attending: Internal Medicine | Admitting: Internal Medicine

## 2016-01-21 ENCOUNTER — Encounter (HOSPITAL_COMMUNITY)
Admission: RE | Admit: 2016-01-21 | Discharge: 2016-01-21 | Disposition: A | Discharge status: Home / Self Care | Payer: Self-pay | Source: Ambulatory Visit | Attending: Internal Medicine | Admitting: Internal Medicine

## 2016-01-23 ENCOUNTER — Encounter (HOSPITAL_COMMUNITY)
Admission: RE | Admit: 2016-01-23 | Discharge: 2016-01-23 | Disposition: A | Discharge status: Home / Self Care | Payer: Self-pay | Source: Ambulatory Visit | Attending: Internal Medicine | Admitting: Internal Medicine

## 2016-01-28 ENCOUNTER — Encounter (HOSPITAL_COMMUNITY)
Admission: RE | Admit: 2016-01-28 | Discharge: 2016-01-28 | Disposition: A | Discharge status: Home / Self Care | Payer: Self-pay | Source: Ambulatory Visit | Attending: Internal Medicine | Admitting: Internal Medicine

## 2016-01-30 ENCOUNTER — Encounter (HOSPITAL_COMMUNITY)
Admission: RE | Admit: 2016-01-30 | Discharge: 2016-01-30 | Disposition: A | Discharge status: Home / Self Care | Payer: Self-pay | Source: Ambulatory Visit | Attending: Internal Medicine | Admitting: Internal Medicine

## 2016-02-04 ENCOUNTER — Encounter (HOSPITAL_COMMUNITY): Payer: Self-pay

## 2016-02-04 DIAGNOSIS — J449 Chronic obstructive pulmonary disease, unspecified: Secondary | ICD-10-CM | POA: Diagnosis not present

## 2016-02-04 DIAGNOSIS — J42 Unspecified chronic bronchitis: Secondary | ICD-10-CM | POA: Insufficient documentation

## 2016-02-06 ENCOUNTER — Encounter (HOSPITAL_COMMUNITY): Payer: Self-pay

## 2016-02-10 LAB — FUNGUS CULTURE W SMEAR

## 2016-02-10 LAB — AFB CULTURE WITH SMEAR (NOT AT ARMC): Source:: 0

## 2016-02-11 ENCOUNTER — Encounter (HOSPITAL_COMMUNITY)
Admission: RE | Admit: 2016-02-11 | Discharge: 2016-02-11 | Disposition: A | Discharge status: Home / Self Care | Payer: Self-pay | Source: Ambulatory Visit | Attending: Internal Medicine | Admitting: Internal Medicine

## 2016-02-13 ENCOUNTER — Encounter (HOSPITAL_COMMUNITY)
Admission: RE | Admit: 2016-02-13 | Discharge: 2016-02-13 | Disposition: A | Discharge status: Home / Self Care | Payer: Self-pay | Source: Ambulatory Visit | Attending: Internal Medicine | Admitting: Internal Medicine

## 2016-02-18 ENCOUNTER — Ambulatory Visit (INDEPENDENT_AMBULATORY_CARE_PROVIDER_SITE_OTHER): Payer: Commercial Managed Care - HMO | Admitting: Internal Medicine

## 2016-02-18 ENCOUNTER — Encounter: Payer: Self-pay | Admitting: Internal Medicine

## 2016-02-18 VITALS — BP 118/72 | HR 100 | Ht 67.0 in | Wt 184.0 lb

## 2016-02-18 DIAGNOSIS — J9611 Chronic respiratory failure with hypoxia: Secondary | ICD-10-CM | POA: Diagnosis not present

## 2016-02-18 DIAGNOSIS — J449 Chronic obstructive pulmonary disease, unspecified: Secondary | ICD-10-CM | POA: Diagnosis not present

## 2016-02-18 MED ORDER — DOXYCYCLINE HYCLATE 100 MG PO TABS: 100.0000 mg | ORAL_TABLET | Freq: Two times a day (BID) | ORAL | Status: DC

## 2016-02-18 MED ORDER — PREDNISONE 10 MG PO TABS: ORAL_TABLET | ORAL | Status: DC

## 2016-02-18 NOTE — Progress Notes (Signed)
Subjective:     Patient ID: Katie Greene, female   DOB: [birth year], 60 y.o.   MRN: [id number]  HPI    OV 02/18/2016  Chief Complaint  Patient presents with  . Follow-up    pt c/o wheezing, runny nose worse qhs- tries to stay indoors d/t heat and humidity worsening her SOB.  Pt requesting an order for a wheelchair.     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 The summer heat is bothering her and she is trying to stay indoors. In accounting for the heat she does not seem to be in an exacerbation. Her sister passed away and she was planning to move to [place] to be with her daughter but these plans of change. She inherited some large piece of land. She's disposing that right now and with that she'll be able to afford living and [place]. Early August 2017 she is going to trip to [place] with her daughter for a family reunion and medication. She wants a wheelchair.     has a past medical history of Anxiety; Arthritis; Hyperlipidemia; Hypertension; Palpitations; Fatigue; Chest pain; COPD (chronic obstructive pulmonary disease) ([place]); Depression; GERD (gastroesophageal reflux disease); On home oxygen therapy; and Hemorrhoids.   reports that she quit smoking about 3 years ago. Her smoking use included Cigarettes. She has a 12.5 pack-year smoking history. She has never used smokeless tobacco.  Past Surgical History  Procedure Laterality Date  . Oopherectomy      1 ovary removed only  . Salpingectomy      1 fallopian tube removed  . Colonoscopy      x2 with polyps removed  . Polypectomy    . Cholecystectomy      laparosopic attempted, then had to open  . Colonoscopy N/A 08/27/2015    Procedure: COLONOSCOPY;  Surgeon: Manus Gunning, MD;  Location: [facility];  Service: Gastroenterology;  Laterality: N/A;    No Known Allergies  Immunization History  Administered Date(s) Administered  . Influenza Split 06/10/2012,  04/03/2014, 04/20/2015  . Influenza,inj,Quad PF,36+ Mos 04/24/2013  . Pneumococcal Conjugate-13 05/07/2015  . Pneumococcal Polysaccharide-23 06/10/2012  . Zoster 05/10/2014    Family History  Problem Relation Age of Onset  . Heart attack Mother   . Pancreatic cancer Mother   . Prostate cancer Father   . Hypertension Father   . Lung cancer Father   . Throat cancer Brother     Throat Cancer  . Colon cancer Sister      Current outpatient prescriptions:  .  albuterol (VENTOLIN HFA) 108 (90 Base) MCG/ACT inhaler, Inhale 2 puffs into the lungs every 6 (six) hours as needed for wheezing or shortness of breath., Disp: 1 Inhaler, Rfl: 5 .  ALPRAZolam (XANAX) 0.5 MG tablet, Take 0.5 mg by mouth 3 (three) times daily as needed for anxiety. , Disp: , Rfl:  .  aspirin EC 81 MG tablet, Take 81 mg by mouth every morning. , Disp: , Rfl:  .  budesonide-formoterol (SYMBICORT) 160-4.5 MCG/ACT inhaler, Inhale 2 puffs into the lungs 2 (two) times daily., Disp: 1 Inhaler, Rfl: 6 .  buPROPion (WELLBUTRIN SR) 100 MG 12 hr tablet, Take 100 mg by mouth every morning. , Disp: , Rfl:  .  cetirizine (ZYRTEC) 10 MG tablet, Take 10 mg by mouth at bedtime., Disp: , Rfl:  .  cholecalciferol (VITAMIN D) 1000 UNITS tablet, Take 1,000 Units by mouth daily., Disp: , Rfl:  .  diltiazem (CARDIZEM  CD) 180 MG 24 hr capsule, Take 180 mg by mouth every morning. , Disp: , Rfl:  .  ferrous sulfate 325 (65 FE) MG tablet, Take 325 mg by mouth daily with breakfast., Disp: , Rfl:  .  fluticasone (FLONASE) 50 MCG/ACT nasal spray, Place 2 sprays into both nostrils daily., Disp: 16 g, Rfl: 5 .  hydrochlorothiazide (HYDRODIURIL) 25 MG tablet, Take 25 mg by mouth every morning. , Disp: , Rfl:  .  meloxicam (MOBIC) 15 MG tablet, Take 15 mg by mouth daily., Disp: , Rfl:  .  metoprolol succinate (TOPROL-XL) 50 MG 24 hr tablet, Take 50 mg by mouth every morning. Take with or immediately following a meal., Disp: , Rfl:  .  omeprazole  (PRILOSEC) 20 MG capsule, Take 20 mg by mouth daily. , Disp: , Rfl:  .  OXYGEN, Inhale 2-3 L into the lungs daily., Disp: , Rfl:  .  polyethylene glycol (MIRALAX / GLYCOLAX) packet, Take 17 g by mouth daily., Disp: , Rfl:  .  pravastatin (PRAVACHOL) 80 MG tablet, Take 80 mg by mouth every morning. , Disp: , Rfl:  .  SPIRIVA HANDIHALER 18 MCG inhalation capsule, PLACE 1 CAPSULE INTO THE INHALER AND INHALE THE CONTENTS OF IT AS INSTRUCTED EVERY DAY , Disp: 30 capsule, Rfl: 2 .  spironolactone (ALDACTONE) 50 MG tablet, Take 25 mg by mouth every morning. , Disp: , Rfl:  .  temazepam (RESTORIL) 30 MG capsule, Take 30 mg by mouth at bedtime as needed for sleep. , Disp: , Rfl:     Review of Systems     Objective:   Physical Exam  Constitutional: She is oriented to person, place, and time. She appears well-developed and well-nourished. No distress.  HENT:  Head: Normocephalic and atraumatic.  Right Ear: External ear normal.  Left Ear: External ear normal.  Mouth/Throat: Oropharynx is clear and moist. No oropharyngeal exudate.  Oxygen on  Eyes: Conjunctivae and EOM are normal. Pupils are equal, round, and reactive to light. Right eye exhibits no discharge. Left eye exhibits no discharge. No scleral icterus.  Neck: Normal range of motion. Neck supple. No JVD present. No tracheal deviation present. No thyromegaly present.  Cardiovascular: Normal rate, regular rhythm, normal heart sounds and intact distal pulses.  Exam reveals no gallop and no friction rub.   No murmur heard. Pulmonary/Chest: Effort normal and breath sounds normal. No respiratory distress. She has no wheezes. She has no rales. She exhibits no tenderness.  Baseline mild purse lip breathing with prolonged expiration but without any distress or wheeze  Abdominal: Soft. Bowel sounds are normal. She exhibits no distension and no mass. There is no tenderness. There is no rebound and no guarding.  Visible obesity present  Musculoskeletal:  Normal range of motion. She exhibits no edema or tenderness.  Lymphadenopathy:    She has no cervical adenopathy.  Neurological: She is alert and oriented to person, place, and time. She has normal reflexes. No cranial nerve deficit. She exhibits normal muscle tone. Coordination normal.  Skin: Skin is warm and dry. No rash noted. She is not diaphoretic. No erythema. No pallor.  Psychiatric: She has a normal mood and affect. Her behavior is normal. Judgment and thought content normal.  Vitals reviewed.   Filed Vitals:   02/18/16 0936  BP: 118/72  Pulse: 100  Height: 5\' 7"  (1.702 m)  Weight: 184 lb (83.462 kg)  SpO2: 96%   Body mass index is 28.81 kg/(m^2).      Assessment:  ICD-9-CM ICD-10-CM   1. Chronic respiratory failure with hypoxia (HCC) 518.83 J96.11    799.02    2. COPD, severe ([place]) 496 J44.9        Plan:      Stable disease  Plan - Continue oxygen as before - Continue daily Spiriva; take sample   continue daily Symbicort; take sample - Continue albuterol as needed - For travel to [place] in August 2017 take the following emergency prescription  - Take prednisone 40 mg daily x 2 days, then 20mg  daily x 2 days, then 10mg  daily x 2 days, then 5mg  daily x 2 days and stop  - Take doxycycline 100mg  po twice daily x 5 days; take after meals and avoid sunlight - ok for wheel chair  Follow-up - 3 months or sooner if needed  -   Dr. Brand Males, M.D., [facility].C.P Pulmonary and Critical Care Medicine Staff Physician [place] [facility] Pager: [phone], If no answer or between  15:00h - 7:00h: call [phone]  02/18/2016 9:55 AM

## 2016-02-18 NOTE — Patient Instructions (Addendum)
ICD-9-CM ICD-10-CM   1. Chronic respiratory failure with hypoxia (HCC) 518.83 J96.11    799.02    2. COPD, severe ([place]) 496 J44.9    Stable disease  Plan - Continue oxygen as before - Continue daily Spiriva; take sample   continue daily Symbicort; take sample - Continue albuterol as needed - For travel to [place] in August 2017 take the following emergency prescription  - Take prednisone 40 mg daily x 2 days, then 20mg  daily x 2 days, then 10mg  daily x 2 days, then 5mg  daily x 2 days and stop  - Take doxycycline 100mg  po twice daily x 5 days; take after meals and avoid sunlight - ok for wheelchair  Follow-up - 3 months or sooner if needed  -

## 2016-02-20 ENCOUNTER — Encounter (HOSPITAL_COMMUNITY)
Admission: RE | Admit: 2016-02-20 | Discharge: 2016-02-20 | Disposition: A | Discharge status: Home / Self Care | Payer: Self-pay | Source: Ambulatory Visit | Attending: Internal Medicine | Admitting: Internal Medicine

## 2016-02-24 DIAGNOSIS — J449 Chronic obstructive pulmonary disease, unspecified: Secondary | ICD-10-CM | POA: Diagnosis not present

## 2016-02-24 DIAGNOSIS — J9611 Chronic respiratory failure with hypoxia: Secondary | ICD-10-CM | POA: Diagnosis not present

## 2016-02-25 ENCOUNTER — Encounter (HOSPITAL_COMMUNITY)
Admission: RE | Admit: 2016-02-25 | Discharge: 2016-02-25 | Disposition: A | Discharge status: Home / Self Care | Payer: Self-pay | Source: Ambulatory Visit | Attending: Internal Medicine | Admitting: Internal Medicine

## 2016-02-27 ENCOUNTER — Encounter (HOSPITAL_COMMUNITY)
Admission: RE | Admit: 2016-02-27 | Discharge: 2016-02-27 | Disposition: A | Discharge status: Home / Self Care | Payer: Self-pay | Source: Ambulatory Visit | Attending: Internal Medicine | Admitting: Internal Medicine

## 2016-02-28 DIAGNOSIS — Z803 Family history of malignant neoplasm of breast: Secondary | ICD-10-CM | POA: Diagnosis not present

## 2016-02-28 DIAGNOSIS — Z1231 Encounter for screening mammogram for malignant neoplasm of breast: Secondary | ICD-10-CM | POA: Diagnosis not present

## 2016-03-03 ENCOUNTER — Encounter (HOSPITAL_COMMUNITY): Payer: Self-pay

## 2016-03-03 DIAGNOSIS — J42 Unspecified chronic bronchitis: Secondary | ICD-10-CM | POA: Insufficient documentation

## 2016-03-05 ENCOUNTER — Encounter (HOSPITAL_COMMUNITY): Payer: Self-pay

## 2016-03-06 DIAGNOSIS — J449 Chronic obstructive pulmonary disease, unspecified: Secondary | ICD-10-CM | POA: Diagnosis not present

## 2016-03-10 ENCOUNTER — Encounter (HOSPITAL_COMMUNITY): Payer: Self-pay

## 2016-03-12 ENCOUNTER — Encounter (HOSPITAL_COMMUNITY): Payer: Self-pay

## 2016-03-17 ENCOUNTER — Encounter (HOSPITAL_COMMUNITY)
Admission: RE | Admit: 2016-03-17 | Discharge: 2016-03-17 | Disposition: A | Discharge status: Home / Self Care | Payer: Self-pay | Source: Ambulatory Visit | Attending: Internal Medicine | Admitting: Internal Medicine

## 2016-03-19 ENCOUNTER — Encounter (HOSPITAL_COMMUNITY)
Admission: RE | Admit: 2016-03-19 | Discharge: 2016-03-19 | Disposition: A | Discharge status: Home / Self Care | Payer: Self-pay | Source: Ambulatory Visit | Attending: Internal Medicine | Admitting: Internal Medicine

## 2016-03-19 DIAGNOSIS — N390 Urinary tract infection, site not specified: Secondary | ICD-10-CM | POA: Diagnosis not present

## 2016-03-19 DIAGNOSIS — I10 Essential (primary) hypertension: Secondary | ICD-10-CM | POA: Diagnosis not present

## 2016-03-19 DIAGNOSIS — E784 Other hyperlipidemia: Secondary | ICD-10-CM | POA: Diagnosis not present

## 2016-03-19 DIAGNOSIS — R8299 Other abnormal findings in urine: Secondary | ICD-10-CM | POA: Diagnosis not present

## 2016-03-24 ENCOUNTER — Encounter (HOSPITAL_COMMUNITY)
Admission: RE | Admit: 2016-03-24 | Discharge: 2016-03-24 | Disposition: A | Discharge status: Home / Self Care | Payer: Self-pay | Source: Ambulatory Visit | Attending: Internal Medicine | Admitting: Internal Medicine

## 2016-03-26 ENCOUNTER — Encounter (HOSPITAL_COMMUNITY): Admission: RE | Admit: 2016-03-26 | Payer: Self-pay | Source: Ambulatory Visit

## 2016-03-26 DIAGNOSIS — J449 Chronic obstructive pulmonary disease, unspecified: Secondary | ICD-10-CM | POA: Diagnosis not present

## 2016-03-26 DIAGNOSIS — Z1212 Encounter for screening for malignant neoplasm of rectum: Secondary | ICD-10-CM | POA: Diagnosis not present

## 2016-03-26 DIAGNOSIS — Z9981 Dependence on supplemental oxygen: Secondary | ICD-10-CM | POA: Diagnosis not present

## 2016-03-26 DIAGNOSIS — J9611 Chronic respiratory failure with hypoxia: Secondary | ICD-10-CM | POA: Diagnosis not present

## 2016-03-26 DIAGNOSIS — R0609 Other forms of dyspnea: Secondary | ICD-10-CM | POA: Diagnosis not present

## 2016-03-26 DIAGNOSIS — I10 Essential (primary) hypertension: Secondary | ICD-10-CM | POA: Diagnosis not present

## 2016-03-26 DIAGNOSIS — E784 Other hyperlipidemia: Secondary | ICD-10-CM | POA: Diagnosis not present

## 2016-03-26 DIAGNOSIS — F5104 Psychophysiologic insomnia: Secondary | ICD-10-CM | POA: Diagnosis not present

## 2016-03-26 DIAGNOSIS — F0390 Unspecified dementia without behavioral disturbance: Secondary | ICD-10-CM | POA: Diagnosis not present

## 2016-03-26 DIAGNOSIS — F329 Major depressive disorder, single episode, unspecified: Secondary | ICD-10-CM | POA: Diagnosis not present

## 2016-03-26 DIAGNOSIS — Z0000 Encounter for general adult medical examination without abnormal findings: Secondary | ICD-10-CM | POA: Diagnosis not present

## 2016-03-28 ENCOUNTER — Encounter: Payer: Self-pay | Admitting: Internal Medicine

## 2016-03-30 ENCOUNTER — Encounter: Payer: Self-pay | Admitting: Emergency Medicine

## 2016-03-30 NOTE — Telephone Encounter (Signed)
pls do a letter. I can sign it later this week or next week  Dr. Brand Males, M.D., [facility].C.P Pulmonary and Critical Care Medicine Staff Physician [place] [facility] Pager: [phone], If no answer or between  15:00h - 7:00h: call [phone]  03/30/2016 11:34 AM

## 2016-03-30 NOTE — Telephone Encounter (Signed)
MR please advise if you will write this letter. Thanks.

## 2016-03-31 ENCOUNTER — Encounter (HOSPITAL_COMMUNITY): Payer: Self-pay

## 2016-04-01 ENCOUNTER — Telehealth (HOSPITAL_COMMUNITY): Payer: Self-pay | Admitting: *Deleted

## 2016-04-02 ENCOUNTER — Encounter (HOSPITAL_COMMUNITY): Admission: RE | Admit: 2016-04-02 | Payer: Self-pay | Source: Ambulatory Visit

## 2016-04-06 DIAGNOSIS — J449 Chronic obstructive pulmonary disease, unspecified: Secondary | ICD-10-CM | POA: Diagnosis not present

## 2016-04-07 ENCOUNTER — Encounter (HOSPITAL_COMMUNITY)
Admission: RE | Admit: 2016-04-07 | Discharge: 2016-04-07 | Disposition: A | Discharge status: Home / Self Care | Payer: Self-pay | Source: Ambulatory Visit | Attending: Internal Medicine | Admitting: Internal Medicine

## 2016-04-07 DIAGNOSIS — J42 Unspecified chronic bronchitis: Secondary | ICD-10-CM | POA: Insufficient documentation

## 2016-04-09 ENCOUNTER — Encounter (HOSPITAL_COMMUNITY)
Admission: RE | Admit: 2016-04-09 | Discharge: 2016-04-09 | Disposition: A | Discharge status: Home / Self Care | Payer: Self-pay | Source: Ambulatory Visit | Attending: Internal Medicine | Admitting: Internal Medicine

## 2016-04-13 ENCOUNTER — Telehealth: Payer: Self-pay | Admitting: Internal Medicine

## 2016-04-13 NOTE — Telephone Encounter (Signed)
Spoke with pt and she is requesting to change from Spiriva Handihaler to Spiriva Respimat. Pt states that this works better for her. Advised that we will send message to MR for approval. Pt also requests that it be sent in for 30D supply,  instead of 90D as she cannot afford the 90D, to [employer].   MR - Please advise if ok for pt to change from Spiriva Handihaler to Respimat. Thanks!

## 2016-04-14 ENCOUNTER — Encounter (HOSPITAL_COMMUNITY): Admission: RE | Admit: 2016-04-14 | Payer: Self-pay | Source: Ambulatory Visit

## 2016-04-14 MED ORDER — TIOTROPIUM BROMIDE MONOHYDRATE 2.5 MCG/ACT IN AERS: 2.0000 | INHALATION_SPRAY | Freq: Every day | RESPIRATORY_TRACT | 5 refills | Status: DC

## 2016-04-14 NOTE — Telephone Encounter (Signed)
Spoke with pt. She is aware that we will send in this prescription for her. Nothing further was needed. 

## 2016-04-14 NOTE — Telephone Encounter (Signed)
Pt returning call.Katie Greene ° °

## 2016-04-14 NOTE — Telephone Encounter (Signed)
lmtcb x1 for pt. 

## 2016-04-14 NOTE — Telephone Encounter (Signed)
spiriva respimat 30day supply ok with me for Katie Greene   Dr. Brand Males, M.D., [facility].C.P Pulmonary and Critical Care Medicine Staff Physician [place] [facility] Pager: [phone], If no answer or between  15:00h - 7:00h: call [phone]  04/14/2016 5:58 AM

## 2016-04-16 ENCOUNTER — Encounter (HOSPITAL_COMMUNITY)
Admission: RE | Admit: 2016-04-16 | Discharge: 2016-04-16 | Disposition: A | Discharge status: Home / Self Care | Payer: Self-pay | Source: Ambulatory Visit | Attending: Internal Medicine | Admitting: Internal Medicine

## 2016-04-21 ENCOUNTER — Encounter (HOSPITAL_COMMUNITY)
Admission: RE | Admit: 2016-04-21 | Discharge: 2016-04-21 | Disposition: A | Discharge status: Home / Self Care | Payer: Self-pay | Source: Ambulatory Visit | Attending: Internal Medicine | Admitting: Internal Medicine

## 2016-04-23 ENCOUNTER — Encounter (HOSPITAL_COMMUNITY)
Admission: RE | Admit: 2016-04-23 | Discharge: 2016-04-23 | Disposition: A | Discharge status: Home / Self Care | Payer: Self-pay | Source: Ambulatory Visit | Attending: Internal Medicine | Admitting: Internal Medicine

## 2016-04-26 DIAGNOSIS — J9611 Chronic respiratory failure with hypoxia: Secondary | ICD-10-CM | POA: Diagnosis not present

## 2016-04-26 DIAGNOSIS — J449 Chronic obstructive pulmonary disease, unspecified: Secondary | ICD-10-CM | POA: Diagnosis not present

## 2016-04-28 ENCOUNTER — Encounter (HOSPITAL_COMMUNITY)
Admission: RE | Admit: 2016-04-28 | Discharge: 2016-04-28 | Disposition: A | Discharge status: Home / Self Care | Payer: Self-pay | Source: Ambulatory Visit | Attending: Internal Medicine | Admitting: Internal Medicine

## 2016-04-30 ENCOUNTER — Encounter (HOSPITAL_COMMUNITY)
Admission: RE | Admit: 2016-04-30 | Discharge: 2016-04-30 | Disposition: A | Discharge status: Home / Self Care | Payer: Self-pay | Source: Ambulatory Visit | Attending: Internal Medicine | Admitting: Internal Medicine

## 2016-05-05 ENCOUNTER — Encounter (HOSPITAL_COMMUNITY)
Admission: RE | Admit: 2016-05-05 | Discharge: 2016-05-05 | Disposition: A | Discharge status: Home / Self Care | Payer: Self-pay | Source: Ambulatory Visit | Attending: Internal Medicine | Admitting: Internal Medicine

## 2016-05-05 DIAGNOSIS — J42 Unspecified chronic bronchitis: Secondary | ICD-10-CM | POA: Insufficient documentation

## 2016-05-06 DIAGNOSIS — J449 Chronic obstructive pulmonary disease, unspecified: Secondary | ICD-10-CM | POA: Diagnosis not present

## 2016-05-07 ENCOUNTER — Encounter (HOSPITAL_COMMUNITY)
Admission: RE | Admit: 2016-05-07 | Discharge: 2016-05-07 | Disposition: A | Discharge status: Home / Self Care | Payer: Self-pay | Source: Ambulatory Visit | Attending: Internal Medicine | Admitting: Internal Medicine

## 2016-05-12 ENCOUNTER — Encounter (HOSPITAL_COMMUNITY): Payer: Self-pay

## 2016-05-14 ENCOUNTER — Encounter (HOSPITAL_COMMUNITY)
Admission: RE | Admit: 2016-05-14 | Discharge: 2016-05-14 | Disposition: A | Discharge status: Home / Self Care | Payer: Self-pay | Source: Ambulatory Visit | Attending: Internal Medicine | Admitting: Internal Medicine

## 2016-05-18 ENCOUNTER — Inpatient Hospital Stay: Admission: RE | Admit: 2016-05-18 | Payer: Commercial Managed Care - HMO | Source: Ambulatory Visit

## 2016-05-19 ENCOUNTER — Encounter (HOSPITAL_COMMUNITY)
Admission: RE | Admit: 2016-05-19 | Discharge: 2016-05-19 | Disposition: A | Discharge status: Home / Self Care | Payer: Self-pay | Source: Ambulatory Visit | Attending: Internal Medicine | Admitting: Internal Medicine

## 2016-05-21 ENCOUNTER — Encounter (HOSPITAL_COMMUNITY)
Admission: RE | Admit: 2016-05-21 | Discharge: 2016-05-21 | Disposition: A | Discharge status: Home / Self Care | Payer: Self-pay | Source: Ambulatory Visit | Attending: Internal Medicine | Admitting: Internal Medicine

## 2016-05-22 ENCOUNTER — Telehealth: Payer: Self-pay | Admitting: Acute Care

## 2016-05-22 ENCOUNTER — Inpatient Hospital Stay: Admission: RE | Admit: 2016-05-22 | Payer: Commercial Managed Care - HMO | Source: Ambulatory Visit

## 2016-05-22 ENCOUNTER — Ambulatory Visit (INDEPENDENT_AMBULATORY_CARE_PROVIDER_SITE_OTHER)
Admission: RE | Admit: 2016-05-22 | Discharge: 2016-05-22 | Disposition: A | Discharge status: Home / Self Care | Payer: Commercial Managed Care - HMO | Source: Ambulatory Visit | Attending: Acute Care | Admitting: Acute Care

## 2016-05-22 DIAGNOSIS — Z87891 Personal history of nicotine dependence: Secondary | ICD-10-CM

## 2016-05-22 NOTE — Telephone Encounter (Signed)
I have called [name] with the results of her low-dose screening CT. I explained to her that her scan was read as a lung RADS 2, indicating nodules that are benign in appearance or behavior. I explained that  radiology recommendation is for follow-up screening scan in 12 months, which we will order and schedule for October 2018. I also let her know that there was an incidental finding of three-vessel coronary artery atherosclerosis, including aortic atherosclerosis and emphysema. She verbalized that this is a common finding with her yearly scans. She confirms that her primary care physician checked her cholesterol and triglycerides annually. I told her that I will fax a copy of this exam to her primary care physician in order that he can follow up clinically as he feels is indicated. She verbalized understanding of the above and had no further questions at completion of the call.

## 2016-05-26 ENCOUNTER — Encounter: Payer: Self-pay | Admitting: Internal Medicine

## 2016-05-26 ENCOUNTER — Telehealth: Payer: Self-pay | Admitting: Internal Medicine

## 2016-05-26 ENCOUNTER — Encounter (HOSPITAL_COMMUNITY): Payer: Self-pay

## 2016-05-26 ENCOUNTER — Ambulatory Visit (INDEPENDENT_AMBULATORY_CARE_PROVIDER_SITE_OTHER): Payer: Commercial Managed Care - HMO | Admitting: Internal Medicine

## 2016-05-26 VITALS — BP 110/58 | HR 96 | Ht 67.0 in | Wt 183.0 lb

## 2016-05-26 DIAGNOSIS — J449 Chronic obstructive pulmonary disease, unspecified: Secondary | ICD-10-CM | POA: Diagnosis not present

## 2016-05-26 DIAGNOSIS — J9611 Chronic respiratory failure with hypoxia: Secondary | ICD-10-CM | POA: Diagnosis not present

## 2016-05-26 NOTE — Telephone Encounter (Signed)
RESEARCH NOTE  -Katie Greene with [birth year] was a subject in [id number]: [employer] IMPACT research study: blinded >/= 52 week randomized trial of ANORO equivalent v BREO equivalent v GSK triple MDI (ICS + LABA + LAMA). Sponsored by [employer]  She was subject nuber [id number]  - she is no longer on the study. She's finished end of treatment.  She is now on standard of care therapy. 05/26/2016 visit was for standard of care for copd which is stable. A few months ago during close out visit and monitoring evaluation it was discovered that subject during the course of her study had electrocardiogram done at incorrect time points. This contributed to potential safety monitoring protocol deviation.I discussed this and disclosed this to the patient on the standard of care visit it 05/26/2016  for Katie Greene  . Oir review shows that she did not have EKG at visit 5   - impact of this protocol deviation and safety of medication is very minimal based on commericial approval of some of the IP at time of study other LABA, LAMA, ICS in market.  - She verbalized understanding and had no further questions. She appreciated the disclosure   Dr. Brand Males, M.D., [facility].C.P Pulmonary and Critical Care Medicine Staff Physician [place] [facility] Pager: [phone], If no answer or between  15:00h - 7:00h: call [phone]  05/26/2016 11:25 AM

## 2016-05-26 NOTE — Patient Instructions (Addendum)
ICD-9-CM ICD-10-CM   1. Chronic respiratory failure with hypoxia (HCC) 518.83 J96.11    799.02    2. COPD, severe ([place]) 496 J44.9      stable disease  Plan - Continue oxygen as before - Continue daily Spiriva as respimat; take sample   continue daily Symbicort; take sample - Continue albuterol as needed -glad uptodate with flu shot  - take print out of CT report   Follow-up - 3 months or sooner if needed - lung cancer screen CT next oct 2018 via Anselm Lis

## 2016-05-26 NOTE — Telephone Encounter (Signed)
error 

## 2016-05-26 NOTE — Progress Notes (Signed)
Subjective:     Patient ID: Katie Greene, female   DOB: [birth year], 60 y.o.   MRN: [id number]  PCP Donnajean Lopes, MD  HPI  OV 02/18/2016  Chief Complaint  Patient presents with  . Follow-up    pt c/o wheezing, runny nose worse qhs- tries to stay indoors d/t heat and humidity worsening her SOB.  Pt requesting an order for a wheelchair.     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 The summer heat is bothering her and she is trying to stay indoors. In accounting for the heat she does not seem to be in an exacerbation. Her sister passed away and she was planning to move to [place] to be with her daughter but these plans of change. She inherited some large piece of land. She's disposing that right now and with that she'll be able to afford living and [place]. Early August 2017 she is going to trip to [place] with her daughter for a family reunion and medication. She wants a wheelchair.  OV 05/26/2016  Chief Complaint  Patient presents with  . Follow-up    Pt states her SOB has slightly worsened since last OV in 02/2016. Pt states her cough is at baseline - with little mucus production with yellow to green mucus. Pt states her appetite has decreased. Pt denies CP/tightness and f/c/s.    [place] 3- 4 3. Chronic hypoxemic respilure.Presents for routine follow-up. She visited [place] for a family reunion in August 2017. This went well without any exacerbation. Overall she is doing well. Shortness of breath and cough is at baseline. There no new issues. She now has a wheelchair. She uses 3 L of oxygen at baseline but sometimes s2      has a past medical history of Anxiety; Arthritis; Chest pain; COPD (chronic obstructive pulmonary disease) ([place]); Depression; Fatigue; GERD (gastroesophageal reflux disease); Hemorrhoids; Hyperlipidemia; Hypertension; On home oxygen therapy; and Palpitations.   reports that she quit smoking about  4 years ago. Her smoking use included Cigarettes. She has a 12.50 pack-year smoking history. She has never used smokeless tobacco.  Past Surgical History:  Procedure Laterality Date  . CHOLECYSTECTOMY     laparosopic attempted, then had to open  . COLONOSCOPY     x2 with polyps removed  . COLONOSCOPY N/A 08/27/2015   Procedure: COLONOSCOPY;  Surgeon: Manus Gunning, MD;  Location: [facility];  Service: Gastroenterology;  Laterality: N/A;  . oopherectomy     1 ovary removed only  . POLYPECTOMY    . SALPINGECTOMY     1 fallopian tube removed    No Known Allergies  Immunization History  Administered Date(s) Administered  . Influenza Split 06/10/2012, 04/03/2014, 04/20/2015  . Influenza,inj,Quad PF,36+ Mos 04/24/2013, 03/03/2016  . Pneumococcal Conjugate-13 05/07/2015  . Pneumococcal Polysaccharide-23 06/10/2012  . Zoster 05/10/2014    Family History  Problem Relation Age of Onset  . Heart attack Mother   . Pancreatic cancer Mother   . Prostate cancer Father   . Hypertension Father   . Lung cancer Father   . Throat cancer Brother     Throat Cancer  . Colon cancer Sister      Current Outpatient Prescriptions:  .  Acetylcysteine 600 MG CAPS, Take 1 capsule by mouth 2 (two) times daily., Disp: , Rfl:  .  albuterol (VENTOLIN HFA) 108 (90 Base) MCG/ACT inhaler, Inhale 2 puffs into the lungs every 6 (six) hours as needed for  wheezing or shortness of breath., Disp: 1 Inhaler, Rfl: 5 .  ALPRAZolam (XANAX) 0.5 MG tablet, Take 0.5 mg by mouth 3 (three) times daily as needed for anxiety. , Disp: , Rfl:  .  aspirin EC 81 MG tablet, Take 81 mg by mouth every morning. , Disp: , Rfl:  .  budesonide-formoterol (SYMBICORT) 160-4.5 MCG/ACT inhaler, Inhale 2 puffs into the lungs 2 (two) times daily., Disp: 1 Inhaler, Rfl: 6 .  buPROPion (WELLBUTRIN SR) 100 MG 12 hr tablet, Take 100 mg by mouth every morning. , Disp: , Rfl:  .  cetirizine (ZYRTEC) 10 MG tablet, Take 10 mg by mouth at  bedtime., Disp: , Rfl:  .  cholecalciferol (VITAMIN D) 1000 UNITS tablet, Take 1,000 Units by mouth daily., Disp: , Rfl:  .  diltiazem (CARDIZEM CD) 180 MG 24 hr capsule, Take 180 mg by mouth every morning. , Disp: , Rfl:  .  ferrous sulfate 325 (65 FE) MG tablet, Take 325 mg by mouth daily with breakfast., Disp: , Rfl:  .  fluticasone (FLONASE) 50 MCG/ACT nasal spray, Place 2 sprays into both nostrils daily., Disp: 16 g, Rfl: 5 .  hydrochlorothiazide (HYDRODIURIL) 25 MG tablet, Take 25 mg by mouth every morning. , Disp: , Rfl:  .  meloxicam (MOBIC) 15 MG tablet, Take 15 mg by mouth daily., Disp: , Rfl:  .  metoprolol succinate (TOPROL-XL) 50 MG 24 hr tablet, Take 50 mg by mouth every morning. Take with or immediately following a meal., Disp: , Rfl:  .  omeprazole (PRILOSEC) 20 MG capsule, Take 20 mg by mouth daily. , Disp: , Rfl:  .  OVER THE COUNTER MEDICATION, Hemp oil alternating between CBD oil (DAILY), Disp: , Rfl:  .  OVER THE COUNTER MEDICATION, CBD oil - alternating between hemp oil and CBD oil daily, Disp: , Rfl:  .  OXYGEN, Inhale 2-3 L into the lungs daily., Disp: , Rfl:  .  polyethylene glycol (MIRALAX / GLYCOLAX) packet, Take 17 g by mouth daily., Disp: , Rfl:  .  pravastatin (PRAVACHOL) 80 MG tablet, Take 80 mg by mouth every morning. , Disp: , Rfl:  .  spironolactone (ALDACTONE) 50 MG tablet, Take 25 mg by mouth every morning. , Disp: , Rfl:  .  temazepam (RESTORIL) 30 MG capsule, Take 30 mg by mouth at bedtime as needed for sleep. , Disp: , Rfl:  .  Tiotropium Bromide Monohydrate (SPIRIVA RESPIMAT) 2.5 MCG/ACT AERS, Inhale 2 puffs into the lungs daily., Disp: 1 Inhaler, Rfl: 5   Review of Systems     Objective:   Physical Exam  Constitutional: She is oriented to person, place, and time. She appears well-developed and well-nourished. No distress.  HENT:  Head: Normocephalic and atraumatic.  Right Ear: External ear normal.  Left Ear: External ear normal.  Mouth/Throat:  Oropharynx is clear and moist. No oropharyngeal exudate.  o2 on  Eyes: Conjunctivae and EOM are normal. Pupils are equal, round, and reactive to light. Right eye exhibits no discharge. Left eye exhibits no discharge. No scleral icterus.  Neck: Normal range of motion. Neck supple. No JVD present. No tracheal deviation present. No thyromegaly present.  Cardiovascular: Normal rate, regular rhythm, normal heart sounds and intact distal pulses.  Exam reveals no gallop and no friction rub.   No murmur heard. Pulmonary/Chest: Effort normal and breath sounds normal. No respiratory distress. She has no wheezes. She has no rales. She exhibits no tenderness.  Abdominal: Soft. Bowel sounds are normal. She  exhibits no distension and no mass. There is no tenderness. There is no rebound and no guarding.  Musculoskeletal: Normal range of motion. She exhibits no edema or tenderness.  Lymphadenopathy:    She has no cervical adenopathy.  Neurological: She is alert and oriented to person, place, and time. She has normal reflexes. No cranial nerve deficit. She exhibits normal muscle tone. Coordination normal.  Skin: Skin is warm and dry. No rash noted. She is not diaphoretic. No erythema. No pallor.  Psychiatric: She has a normal mood and affect. Her behavior is normal. Judgment and thought content normal.  Vitals reviewed.   Vitals:   05/26/16 1055  BP: (!) 110/58  Pulse: 96  SpO2: 94%  Weight: 183 lb (83 kg)  Height: 5\' 7"  (1.702 m)   Estimated body mass index is 28.66 kg/m as calculated from the following:   Height as of this encounter: 5\' 7"  (1.702 m).   Weight as of this encounter: 183 lb (83 kg).       Assessment:       ICD-9-CM ICD-10-CM   1. Chronic respiratory failure with hypoxia (HCC) 518.83 J96.11    799.02    2. COPD, severe ([place]) 496 J44.9        Plan:       stable disease  Plan - Continue oxygen as before - Continue daily Spiriva as respimat; take sample   continue daily  Symbicort; take sample - Continue albuterol as needed -glad uptodate with flu shot  - take print out of CT report   Follow-up - 3 months or sooner if needed - lung cancer screen CT next oct 2018 via Anselm Lis   Dr. Brand Males, M.D., [facility].C.P Pulmonary and Critical Care Medicine Staff Physician [place] [facility] Pager: [phone], If no answer or between  15:00h - 7:00h: call [phone]  05/26/2016 11:20 AM

## 2016-05-27 DIAGNOSIS — H524 Presbyopia: Secondary | ICD-10-CM | POA: Diagnosis not present

## 2016-05-28 ENCOUNTER — Encounter (HOSPITAL_COMMUNITY)
Admission: RE | Admit: 2016-05-28 | Discharge: 2016-05-28 | Disposition: A | Discharge status: Home / Self Care | Payer: Self-pay | Source: Ambulatory Visit | Attending: Internal Medicine | Admitting: Internal Medicine

## 2016-06-02 ENCOUNTER — Encounter (HOSPITAL_COMMUNITY)
Admission: RE | Admit: 2016-06-02 | Discharge: 2016-06-02 | Disposition: A | Discharge status: Home / Self Care | Payer: Self-pay | Source: Ambulatory Visit | Attending: Internal Medicine | Admitting: Internal Medicine

## 2016-06-04 ENCOUNTER — Encounter (HOSPITAL_COMMUNITY): Payer: Self-pay

## 2016-06-04 DIAGNOSIS — J42 Unspecified chronic bronchitis: Secondary | ICD-10-CM | POA: Insufficient documentation

## 2016-06-06 DIAGNOSIS — J449 Chronic obstructive pulmonary disease, unspecified: Secondary | ICD-10-CM | POA: Diagnosis not present

## 2016-06-09 ENCOUNTER — Encounter (HOSPITAL_COMMUNITY)
Admission: RE | Admit: 2016-06-09 | Discharge: 2016-06-09 | Disposition: A | Discharge status: Home / Self Care | Payer: Self-pay | Source: Ambulatory Visit | Attending: Internal Medicine | Admitting: Internal Medicine

## 2016-06-11 ENCOUNTER — Encounter (HOSPITAL_COMMUNITY)
Admission: RE | Admit: 2016-06-11 | Discharge: 2016-06-11 | Disposition: A | Discharge status: Home / Self Care | Payer: Self-pay | Source: Ambulatory Visit | Attending: Internal Medicine | Admitting: Internal Medicine

## 2016-06-16 ENCOUNTER — Encounter (HOSPITAL_COMMUNITY)
Admission: RE | Admit: 2016-06-16 | Discharge: 2016-06-16 | Disposition: A | Discharge status: Home / Self Care | Payer: Self-pay | Source: Ambulatory Visit | Attending: Internal Medicine | Admitting: Internal Medicine

## 2016-06-18 ENCOUNTER — Encounter (HOSPITAL_COMMUNITY)
Admission: RE | Admit: 2016-06-18 | Discharge: 2016-06-18 | Disposition: A | Discharge status: Home / Self Care | Payer: Self-pay | Source: Ambulatory Visit | Attending: Internal Medicine | Admitting: Internal Medicine

## 2016-06-22 ENCOUNTER — Other Ambulatory Visit: Payer: Self-pay | Admitting: Internal Medicine

## 2016-06-22 ENCOUNTER — Telehealth: Payer: Self-pay | Admitting: Internal Medicine

## 2016-06-22 NOTE — Telephone Encounter (Signed)
Returned Pt. Call and expressed to her that we do not have samples, I asked her is it just she needs a refill but she stated even if she get a refill she would not be able to fill it for 5-7 days, she states she is have increase SOB and using her rescue inhaler up to 2 times a day. Pt. States she is not coughing, but does have some wheezing.  I did inform her since she is using her rescue inhaler more and concerned about her breathing she can get an appointment to come in. She stated will call us back and let us know what she wanted to do.

## 2016-06-23 ENCOUNTER — Encounter (HOSPITAL_COMMUNITY)
Admission: RE | Admit: 2016-06-23 | Discharge: 2016-06-23 | Disposition: A | Discharge status: Home / Self Care | Payer: Self-pay | Source: Ambulatory Visit | Attending: Internal Medicine | Admitting: Internal Medicine

## 2016-06-25 ENCOUNTER — Encounter (HOSPITAL_COMMUNITY): Payer: Self-pay

## 2016-06-26 DIAGNOSIS — J449 Chronic obstructive pulmonary disease, unspecified: Secondary | ICD-10-CM | POA: Diagnosis not present

## 2016-06-26 DIAGNOSIS — J9611 Chronic respiratory failure with hypoxia: Secondary | ICD-10-CM | POA: Diagnosis not present

## 2016-06-29 ENCOUNTER — Telehealth: Payer: Self-pay | Admitting: Internal Medicine

## 2016-06-29 NOTE — Telephone Encounter (Signed)
Called and spoke to pt. Pt is requesting Spiriva sample, samples left up front for pick up. Pt verbalized understanding and denied any further questions or concerns at this time.

## 2016-06-30 ENCOUNTER — Encounter (HOSPITAL_COMMUNITY)
Admission: RE | Admit: 2016-06-30 | Discharge: 2016-06-30 | Disposition: A | Discharge status: Home / Self Care | Payer: Self-pay | Source: Ambulatory Visit | Attending: Internal Medicine | Admitting: Internal Medicine

## 2016-07-02 ENCOUNTER — Encounter (HOSPITAL_COMMUNITY)
Admission: RE | Admit: 2016-07-02 | Discharge: 2016-07-02 | Disposition: A | Discharge status: Home / Self Care | Payer: Self-pay | Source: Ambulatory Visit | Attending: Internal Medicine | Admitting: Internal Medicine

## 2016-07-06 DIAGNOSIS — J449 Chronic obstructive pulmonary disease, unspecified: Secondary | ICD-10-CM | POA: Diagnosis not present

## 2016-07-07 ENCOUNTER — Encounter (HOSPITAL_COMMUNITY)
Admission: RE | Admit: 2016-07-07 | Discharge: 2016-07-07 | Disposition: A | Discharge status: Home / Self Care | Payer: Self-pay | Source: Ambulatory Visit | Attending: Internal Medicine | Admitting: Internal Medicine

## 2016-07-07 DIAGNOSIS — J42 Unspecified chronic bronchitis: Secondary | ICD-10-CM | POA: Insufficient documentation

## 2016-07-09 ENCOUNTER — Encounter (HOSPITAL_COMMUNITY)
Admission: RE | Admit: 2016-07-09 | Discharge: 2016-07-09 | Disposition: A | Discharge status: Home / Self Care | Payer: Self-pay | Source: Ambulatory Visit | Attending: Internal Medicine | Admitting: Internal Medicine

## 2016-07-14 ENCOUNTER — Encounter (HOSPITAL_COMMUNITY)
Admission: RE | Admit: 2016-07-14 | Discharge: 2016-07-14 | Disposition: A | Discharge status: Home / Self Care | Payer: Self-pay | Source: Ambulatory Visit | Attending: Internal Medicine | Admitting: Internal Medicine

## 2016-07-16 ENCOUNTER — Encounter (HOSPITAL_COMMUNITY)
Admission: RE | Admit: 2016-07-16 | Discharge: 2016-07-16 | Disposition: A | Discharge status: Home / Self Care | Payer: Self-pay | Source: Ambulatory Visit | Attending: Internal Medicine | Admitting: Internal Medicine

## 2016-07-17 DIAGNOSIS — Z6829 Body mass index (BMI) 29.0-29.9, adult: Secondary | ICD-10-CM | POA: Diagnosis not present

## 2016-07-17 DIAGNOSIS — L570 Actinic keratosis: Secondary | ICD-10-CM | POA: Diagnosis not present

## 2016-07-17 DIAGNOSIS — R0609 Other forms of dyspnea: Secondary | ICD-10-CM | POA: Diagnosis not present

## 2016-07-21 ENCOUNTER — Encounter (HOSPITAL_COMMUNITY): Payer: Self-pay

## 2016-07-23 ENCOUNTER — Encounter (HOSPITAL_COMMUNITY)
Admission: RE | Admit: 2016-07-23 | Discharge: 2016-07-23 | Disposition: A | Discharge status: Home / Self Care | Payer: Self-pay | Source: Ambulatory Visit | Attending: Internal Medicine | Admitting: Internal Medicine

## 2016-07-26 DIAGNOSIS — J449 Chronic obstructive pulmonary disease, unspecified: Secondary | ICD-10-CM | POA: Diagnosis not present

## 2016-07-26 DIAGNOSIS — J9611 Chronic respiratory failure with hypoxia: Secondary | ICD-10-CM | POA: Diagnosis not present

## 2016-07-28 ENCOUNTER — Encounter (HOSPITAL_COMMUNITY): Payer: Self-pay

## 2016-07-30 ENCOUNTER — Encounter (HOSPITAL_COMMUNITY): Payer: Self-pay

## 2016-08-04 ENCOUNTER — Telehealth: Payer: Self-pay | Admitting: Internal Medicine

## 2016-08-04 ENCOUNTER — Encounter (HOSPITAL_COMMUNITY): Payer: Self-pay

## 2016-08-04 DIAGNOSIS — J42 Unspecified chronic bronchitis: Secondary | ICD-10-CM | POA: Insufficient documentation

## 2016-08-04 MED ORDER — TIOTROPIUM BROMIDE MONOHYDRATE 2.5 MCG/ACT IN AERS: 2.0000 | INHALATION_SPRAY | Freq: Every day | RESPIRATORY_TRACT | 0 refills | Status: DC

## 2016-08-04 NOTE — Telephone Encounter (Signed)
Called and spoke with pt and she stated that the spiriva is going to cost her over $300.  She is not able to afford this and is wanting to know of something close to spiriva that would be much cheaper.  MR please advise. Thanks  No Known Allergies

## 2016-08-04 NOTE — Telephone Encounter (Signed)
I sent in 90 days of Spiriva and left detailed message advising pt.

## 2016-08-05 NOTE — Telephone Encounter (Signed)
Please ask her to price INCRUSE or TUDORZA - might be bcause of insurance change. You can give her samples of eithr spiriva, incruse or tudorza till she prices spiriva alternative  Dr. Brand Males, M.D., [facility].C.P Pulmonary and Critical Care Medicine Staff Physician [place] [facility] Pager: [phone], If no answer or between  15:00h - 7:00h: call [phone]  08/05/2016 8:30 AM

## 2016-08-05 NOTE — Telephone Encounter (Signed)
lmtcb x1 for pt. 

## 2016-08-06 ENCOUNTER — Encounter (HOSPITAL_COMMUNITY): Payer: Self-pay

## 2016-08-06 DIAGNOSIS — J449 Chronic obstructive pulmonary disease, unspecified: Secondary | ICD-10-CM | POA: Diagnosis not present

## 2016-08-06 NOTE — Telephone Encounter (Signed)
lmtcb x2 for pt. 

## 2016-08-07 NOTE — Telephone Encounter (Signed)
Spoke with pt. She is aware of MR's response. States that she will price check these next week. She will call us back and let us know which once is cheaper. We have samples of Incruse, these will be left up front. Nothing further was needed.

## 2016-08-07 NOTE — Telephone Encounter (Signed)
Patient is returning phone.  °

## 2016-08-11 ENCOUNTER — Encounter (HOSPITAL_COMMUNITY): Payer: Self-pay

## 2016-08-13 ENCOUNTER — Encounter (HOSPITAL_COMMUNITY)
Admission: RE | Admit: 2016-08-13 | Discharge: 2016-08-13 | Disposition: A | Discharge status: Home / Self Care | Payer: Self-pay | Source: Ambulatory Visit | Attending: Internal Medicine | Admitting: Internal Medicine

## 2016-08-17 ENCOUNTER — Encounter: Payer: Self-pay | Admitting: Internal Medicine

## 2016-08-18 ENCOUNTER — Encounter (HOSPITAL_COMMUNITY)
Admission: RE | Admit: 2016-08-18 | Discharge: 2016-08-18 | Disposition: A | Discharge status: Home / Self Care | Payer: Self-pay | Source: Ambulatory Visit | Attending: Internal Medicine | Admitting: Internal Medicine

## 2016-08-20 ENCOUNTER — Encounter (HOSPITAL_COMMUNITY): Payer: Self-pay

## 2016-08-21 MED ORDER — UMECLIDINIUM BROMIDE 62.5 MCG/INH IN AEPB: 1.0000 | INHALATION_SPRAY | Freq: Every day | RESPIRATORY_TRACT | 1 refills | Status: DC

## 2016-08-25 ENCOUNTER — Encounter (HOSPITAL_COMMUNITY)
Admission: RE | Admit: 2016-08-25 | Discharge: 2016-08-25 | Disposition: A | Discharge status: Home / Self Care | Payer: Self-pay | Source: Ambulatory Visit | Attending: Internal Medicine | Admitting: Internal Medicine

## 2016-08-26 DIAGNOSIS — J449 Chronic obstructive pulmonary disease, unspecified: Secondary | ICD-10-CM | POA: Diagnosis not present

## 2016-08-26 DIAGNOSIS — J9611 Chronic respiratory failure with hypoxia: Secondary | ICD-10-CM | POA: Diagnosis not present

## 2016-08-27 ENCOUNTER — Encounter (HOSPITAL_COMMUNITY)
Admission: RE | Admit: 2016-08-27 | Discharge: 2016-08-27 | Disposition: A | Discharge status: Home / Self Care | Payer: Self-pay | Source: Ambulatory Visit | Attending: Internal Medicine | Admitting: Internal Medicine

## 2016-09-01 ENCOUNTER — Encounter (HOSPITAL_COMMUNITY): Payer: Self-pay

## 2016-09-02 ENCOUNTER — Encounter: Payer: Self-pay | Admitting: Internal Medicine

## 2016-09-02 ENCOUNTER — Ambulatory Visit (INDEPENDENT_AMBULATORY_CARE_PROVIDER_SITE_OTHER): Payer: Commercial Managed Care - HMO | Admitting: Internal Medicine

## 2016-09-02 VITALS — BP 112/72 | HR 92 | Ht 67.0 in | Wt 182.0 lb

## 2016-09-02 DIAGNOSIS — J9611 Chronic respiratory failure with hypoxia: Secondary | ICD-10-CM | POA: Diagnosis not present

## 2016-09-02 DIAGNOSIS — J449 Chronic obstructive pulmonary disease, unspecified: Secondary | ICD-10-CM

## 2016-09-02 NOTE — Patient Instructions (Signed)
ICD-9-CM ICD-10-CM   1. Chronic respiratory failure with hypoxia (HCC) 518.83 J96.11    799.02    2. COPD, severe ([place]) 496 J44.9     stable disease  Plan - Continue oxygen as before - Continue daily Spiriva as respimat; take sample   continue daily Symbicort; take sample - RN to help the [place] form completeon  - try sample TRELEGY inhaler instead of above for 2 weeks - this is because you were on that research study - Continue albuterol as needed -glad uptodate with flu shot  -   Follow-up - 4 months or sooner if needed - lung cancer screen CT  oct 2018 via Anselm Lis

## 2016-09-02 NOTE — Progress Notes (Signed)
Subjective:     Patient ID: Katie Greene, female   DOB: 1955/09/10, 61 y.o.   MRN: [id number]  HPI    OV 02/18/2016  Chief Complaint  Patient presents with  . Follow-up    pt c/o wheezing, runny nose worse qhs- tries to stay indoors d/t heat and humidity worsening her SOB.  Pt requesting an order for a wheelchair.     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 The summer heat is bothering her and she is trying to stay indoors. In accounting for the heat she does not seem to be in an exacerbation. Her sister passed away and she was planning to move to [place] to be with her daughter but these plans of change. She inherited some large piece of land. She's disposing that right now and with that she'll be able to afford living and [place]. Early August 2017 she is going to trip to [place] with her daughter for a family reunion and medication. She wants a wheelchair.  OV 05/26/2016  Chief Complaint  Patient presents with  . Follow-up    Pt states her SOB has slightly worsened since last OV in 02/2016. Pt states her cough is at baseline - with little mucus production with yellow to green mucus. Pt states her appetite has decreased. Pt denies CP/tightness and f/c/s.    Goald 3- 4 3. Chronic hypoxemic respilure.Presents for routine follow-up. She visited [place], [place] for a family reunion in August 2017. This went well without any exacerbation. Overall she is doing well. Shortness of breath and cough is at baseline. There no new issues. She now has a wheelchair. She uses 3 L of oxygen at baseline but sometimes s2    OV 09/02/2016  Chief Complaint  Patient presents with  . Follow-up    Pt states overall her breathing is unchanged. Pt c/o hoarseness. Pt deneis significant cough and CP/tightness.    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 Presents for routine follow-up. At this point in time COPD stable. She is  compliant with the Spiriva and Symbicort. She is 3 L of oxygen at baseline. She's undergoing grief counseling because of her sister's death. There are no new issues she is up-to-date with flu shot. She wants me to fill out some financial assistance form for a Symbicort    has a past medical history of Anxiety; Arthritis; Chest pain; COPD (chronic obstructive pulmonary disease) ([place]); Depression; Fatigue; GERD (gastroesophageal reflux disease); Hemorrhoids; Hyperlipidemia; Hypertension; On home oxygen therapy; and Palpitations.   reports that she quit smoking about 4 years ago. Her smoking use included Cigarettes. She has a 12.50 pack-year smoking history. She has never used smokeless tobacco.  Past Surgical History:  Procedure Laterality Date  . CHOLECYSTECTOMY     laparosopic attempted, then had to open  . COLONOSCOPY     x2 with polyps removed  . COLONOSCOPY N/A 08/27/2015   Procedure: COLONOSCOPY;  Surgeon: Manus Gunning, MD;  Location: [facility];  Service: Gastroenterology;  Laterality: N/A;  . oopherectomy     1 ovary removed only  . POLYPECTOMY    . SALPINGECTOMY     1 fallopian tube removed    No Known Allergies  Immunization History  Administered Date(s) Administered  . Influenza Split 06/10/2012, 04/03/2014, 04/20/2015  . Influenza,inj,Quad PF,36+ Mos 04/24/2013, 03/03/2016  . Pneumococcal Conjugate-13 05/07/2015  . Pneumococcal Polysaccharide-23 06/10/2012  . Zoster 05/10/2014    Family History  Problem Relation Age of Onset  . Heart attack Mother   . Pancreatic cancer Mother   . Prostate cancer Father   . Hypertension Father   . Lung cancer Father   . Throat cancer Brother     Throat Cancer  . Colon cancer Sister      Current Outpatient Prescriptions:  .  Acetylcysteine 600 MG CAPS, Take 1 capsule by mouth 2 (two) times daily., Disp: , Rfl:  .  ALPRAZolam (XANAX) 0.5 MG tablet, Take 0.5 mg by mouth 3 (three) times daily as needed for anxiety. ,  Disp: , Rfl:  .  aspirin EC 81 MG tablet, Take 81 mg by mouth every morning. , Disp: , Rfl:  .  budesonide-formoterol (SYMBICORT) 160-4.5 MCG/ACT inhaler, Inhale 2 puffs into the lungs 2 (two) times daily., Disp: 1 Inhaler, Rfl: 6 .  buPROPion (WELLBUTRIN SR) 100 MG 12 hr tablet, Take 100 mg by mouth every morning. , Disp: , Rfl:  .  cetirizine (ZYRTEC) 10 MG tablet, Take 10 mg by mouth at bedtime., Disp: , Rfl:  .  cholecalciferol (VITAMIN D) 1000 UNITS tablet, Take 1,000 Units by mouth daily., Disp: , Rfl:  .  diltiazem (CARDIZEM CD) 180 MG 24 hr capsule, Take 180 mg by mouth every morning. , Disp: , Rfl:  .  ferrous sulfate 325 (65 FE) MG tablet, Take 325 mg by mouth daily with breakfast., Disp: , Rfl:  .  fluticasone (FLONASE) 50 MCG/ACT nasal spray, Place 2 sprays into both nostrils daily., Disp: 16 g, Rfl: 5 .  hydrochlorothiazide (HYDRODIURIL) 25 MG tablet, Take 25 mg by mouth every morning. , Disp: , Rfl:  .  meloxicam (MOBIC) 15 MG tablet, Take 15 mg by mouth daily., Disp: , Rfl:  .  metoprolol succinate (TOPROL-XL) 50 MG 24 hr tablet, Take 50 mg by mouth every morning. Take with or immediately following a meal., Disp: , Rfl:  .  omeprazole (PRILOSEC) 20 MG capsule, Take 20 mg by mouth daily. , Disp: , Rfl:  .  OVER THE COUNTER MEDICATION, Hemp oil alternating between CBD oil (DAILY), Disp: , Rfl:  .  OVER THE COUNTER MEDICATION, CBD oil - alternating between hemp oil and CBD oil daily, Disp: , Rfl:  .  OXYGEN, Inhale 2-3 L into the lungs daily., Disp: , Rfl:  .  polyethylene glycol (MIRALAX / GLYCOLAX) packet, Take 17 g by mouth daily., Disp: , Rfl:  .  pravastatin (PRAVACHOL) 80 MG tablet, Take 80 mg by mouth every morning. , Disp: , Rfl:  .  spironolactone (ALDACTONE) 50 MG tablet, Take 25 mg by mouth every morning. , Disp: , Rfl:  .  temazepam (RESTORIL) 30 MG capsule, Take 30 mg by mouth at bedtime as needed for sleep. , Disp: , Rfl:  .  umeclidinium bromide (INCRUSE ELLIPTA) 62.5  MCG/INH AEPB, Inhale 1 puff into the lungs daily., Disp: 90 each, Rfl: 1 .  VENTOLIN HFA 108 (90 Base) MCG/ACT inhaler, INHALE 2 PUFFS INTO THE LUNGS EVERY 6 (SIX) HOURS AS NEEDED., Disp: 18 g, Rfl: 2   Review of Systems     Objective:   Physical Exam  Constitutional: She is oriented to person, place, and time. She appears well-developed and well-nourished. No distress.  obese  HENT:  Head: Normocephalic and atraumatic.  Right Ear: External ear normal.  Left Ear: External ear normal.  Mouth/Throat: Oropharynx is clear and moist. No oropharyngeal exudate.  o2 on  Eyes: Conjunctivae and EOM are normal. Pupils  are equal, round, and reactive to light. Right eye exhibits no discharge. Left eye exhibits no discharge. No scleral icterus.  Neck: Normal range of motion. Neck supple. No JVD present. No tracheal deviation present. No thyromegaly present.  Cardiovascular: Normal rate, regular rhythm, normal heart sounds and intact distal pulses.  Exam reveals no gallop and no friction rub.   No murmur heard. Pulmonary/Chest: Effort normal and breath sounds normal. No respiratory distress. She has no wheezes. She has no rales. She exhibits no tenderness.  purs e lip breathing - baseline without resp distress  Abdominal: Soft. Bowel sounds are normal. She exhibits no distension and no mass. There is no tenderness. There is no rebound and no guarding.  Musculoskeletal: Normal range of motion. She exhibits no edema or tenderness.  Lymphadenopathy:    She has no cervical adenopathy.  Neurological: She is alert and oriented to person, place, and time. She has normal reflexes. No cranial nerve deficit. She exhibits normal muscle tone. Coordination normal.  Skin: Skin is warm and dry. No rash noted. She is not diaphoretic. No erythema. No pallor.  Psychiatric: She has a normal mood and affect. Her behavior is normal. Judgment and thought content normal.  Vitals reviewed.  Vitals:   09/02/16 1106  BP:  112/72  Pulse: 92  SpO2: (!) 3%  Weight: 182 lb (82.6 kg)  Height: 5\' 7"  (1.702 m)   Estimated body mass index is 28.51 kg/m as calculated from the following:   Height as of this encounter: 5\' 7"  (1.702 m).   Weight as of this encounter: 182 lb (82.6 kg).      Assessment:       ICD-9-CM ICD-10-CM   1. Chronic respiratory failure with hypoxia (HCC) 518.83 J96.11    799.02    2. COPD, severe ([place]) 496 J44.9        Plan:      stable disease  Plan - Continue oxygen as before - Continue daily Spiriva as respimat; take sample   continue daily Symbicort; take sample - RN to help the [place] form completeon  - try sample TRELEGY inhaler instead of above for 2 weeks - this is because you were on that research study - Continue albuterol as needed -glad uptodate with flu shot  -   Follow-up - 4 months or sooner if needed - lung cancer screen CT  oct 2018 via [facility]  Dr. Brand Males, M.D., [facility] Pulmonary and Critical Care Medicine Staff Physician [place] [facility] Pager: [phone], If no answer or between  15:00h - 7:00h: call [phone]  09/02/2016 11:18 AM

## 2016-09-03 ENCOUNTER — Encounter (HOSPITAL_COMMUNITY)
Admission: RE | Admit: 2016-09-03 | Discharge: 2016-09-03 | Disposition: A | Discharge status: Home / Self Care | Payer: Self-pay | Source: Ambulatory Visit | Attending: Internal Medicine | Admitting: Internal Medicine

## 2016-09-03 DIAGNOSIS — J42 Unspecified chronic bronchitis: Secondary | ICD-10-CM | POA: Insufficient documentation

## 2016-09-06 DIAGNOSIS — J449 Chronic obstructive pulmonary disease, unspecified: Secondary | ICD-10-CM | POA: Diagnosis not present

## 2016-09-08 ENCOUNTER — Encounter (HOSPITAL_COMMUNITY)
Admission: RE | Admit: 2016-09-08 | Discharge: 2016-09-08 | Disposition: A | Discharge status: Home / Self Care | Payer: Self-pay | Source: Ambulatory Visit | Attending: Internal Medicine | Admitting: Internal Medicine

## 2016-09-10 ENCOUNTER — Encounter (HOSPITAL_COMMUNITY)
Admission: RE | Admit: 2016-09-10 | Discharge: 2016-09-10 | Disposition: A | Discharge status: Home / Self Care | Payer: Self-pay | Source: Ambulatory Visit | Attending: Internal Medicine | Admitting: Internal Medicine

## 2016-09-11 ENCOUNTER — Telehealth: Payer: Self-pay | Admitting: Internal Medicine

## 2016-09-11 DIAGNOSIS — I10 Essential (primary) hypertension: Secondary | ICD-10-CM | POA: Diagnosis not present

## 2016-09-11 DIAGNOSIS — J449 Chronic obstructive pulmonary disease, unspecified: Secondary | ICD-10-CM | POA: Diagnosis not present

## 2016-09-11 DIAGNOSIS — Z6829 Body mass index (BMI) 29.0-29.9, adult: Secondary | ICD-10-CM | POA: Diagnosis not present

## 2016-09-11 DIAGNOSIS — Z9981 Dependence on supplemental oxygen: Secondary | ICD-10-CM | POA: Diagnosis not present

## 2016-09-11 DIAGNOSIS — I2781 Cor pulmonale (chronic): Secondary | ICD-10-CM | POA: Diagnosis not present

## 2016-09-11 NOTE — Telephone Encounter (Signed)
Spoke with pt, states she is checking on status of [place] forms that were left at last office visit. [name] please advise on status of forms.  Thanks!

## 2016-09-14 NOTE — Telephone Encounter (Signed)
Called and spoke to pt. Informed her the pt assistance forms have already been sent and the [employer] form will be faxed once MR signs form. MR is in office on 2.13.2018 will have form signed and faxed. Nothing further needed at this time. Will sign off.

## 2016-09-15 ENCOUNTER — Encounter (HOSPITAL_COMMUNITY)
Admission: RE | Admit: 2016-09-15 | Discharge: 2016-09-15 | Disposition: A | Discharge status: Home / Self Care | Payer: Self-pay | Source: Ambulatory Visit | Attending: Internal Medicine | Admitting: Internal Medicine

## 2016-09-16 ENCOUNTER — Encounter: Payer: Self-pay | Admitting: Internal Medicine

## 2016-09-17 ENCOUNTER — Encounter (HOSPITAL_COMMUNITY): Payer: Self-pay

## 2016-09-18 ENCOUNTER — Telehealth: Payer: Self-pay | Admitting: Internal Medicine

## 2016-09-18 NOTE — Telephone Encounter (Signed)
lmtcb

## 2016-09-21 NOTE — Telephone Encounter (Signed)
lmtcb X 2 

## 2016-09-22 ENCOUNTER — Encounter (HOSPITAL_COMMUNITY)
Admission: RE | Admit: 2016-09-22 | Discharge: 2016-09-22 | Disposition: A | Discharge status: Home / Self Care | Payer: Self-pay | Source: Ambulatory Visit | Attending: Internal Medicine | Admitting: Internal Medicine

## 2016-09-22 NOTE — Telephone Encounter (Signed)
Spoke with pt. She is aware that we have faxed her patient assistance forms but will refax these to [employer].

## 2016-09-22 NOTE — Telephone Encounter (Signed)
Yes, her [employer] forms were completed and faxed on 09/14/16 with confirmation form. Will refax this.

## 2016-09-22 NOTE — Telephone Encounter (Signed)
Katie Greene please advise if you have sent the pt assistance forms in to [place]?  thanks

## 2016-09-24 ENCOUNTER — Encounter (HOSPITAL_COMMUNITY)
Admission: RE | Admit: 2016-09-24 | Discharge: 2016-09-24 | Disposition: A | Discharge status: Home / Self Care | Payer: Self-pay | Source: Ambulatory Visit | Attending: Internal Medicine | Admitting: Internal Medicine

## 2016-09-24 ENCOUNTER — Telehealth: Payer: Self-pay | Admitting: Internal Medicine

## 2016-09-24 NOTE — Telephone Encounter (Signed)
Called [employer] and spoke with Malachy Mood  She states that she is calling on behalf on pt b/c pt told them we have faxed pt assistance forms for Symbicort x 2, but they have not received anything.   She wants [place] to fax the forms again, to attn [place] at [phone] I looked in MR's cubby and do not see his scan folder, so I am unsure where the forms are  [name], do you still have them? Thanks!

## 2016-09-26 DIAGNOSIS — J9611 Chronic respiratory failure with hypoxia: Secondary | ICD-10-CM | POA: Diagnosis not present

## 2016-09-26 DIAGNOSIS — J449 Chronic obstructive pulmonary disease, unspecified: Secondary | ICD-10-CM | POA: Diagnosis not present

## 2016-09-28 ENCOUNTER — Encounter: Payer: Self-pay | Admitting: Internal Medicine

## 2016-09-28 NOTE — Telephone Encounter (Signed)
Called [employer] and was advised to allow 24-48 hours to allow a fax to be processed, form was faxed this morning. Will call back on Wed 09/30/16.

## 2016-09-29 ENCOUNTER — Encounter (HOSPITAL_COMMUNITY)
Admission: RE | Admit: 2016-09-29 | Discharge: 2016-09-29 | Disposition: A | Discharge status: Home / Self Care | Payer: Self-pay | Source: Ambulatory Visit | Attending: Internal Medicine | Admitting: Internal Medicine

## 2016-09-29 NOTE — Telephone Encounter (Signed)
Per previous telephone note:  Collier Salina, RN  5:29 PM  Note    Called and spoke to pt. Informed her the pt assistance forms have already been sent and the [employer] form will be faxed once MR signs form. MR is in office on 2.13.2018 will have form signed and faxed. Nothing further needed at this time. Will sign off.      I do not see anything scanned into the chart and I ws unable to locate these forms in Elise's things.   Please advise Dr Chase Caller if you have [employer] forms on this patient. Thanks.  Will send to [facility] to follow up as well when she returns to work.

## 2016-09-30 NOTE — Telephone Encounter (Signed)
Pt emailed again asking if her [place] forms were received E-mail sent back to patient asking if she brought the forms here to the office vs to [place] and when this was done

## 2016-10-01 ENCOUNTER — Encounter (HOSPITAL_COMMUNITY)
Admission: RE | Admit: 2016-10-01 | Discharge: 2016-10-01 | Disposition: A | Discharge status: Home / Self Care | Payer: Self-pay | Source: Ambulatory Visit | Attending: Internal Medicine | Admitting: Internal Medicine

## 2016-10-01 DIAGNOSIS — J42 Unspecified chronic bronchitis: Secondary | ICD-10-CM | POA: Insufficient documentation

## 2016-10-02 NOTE — Telephone Encounter (Signed)
Called [employer] and was advised they are missing information from the patient - financial information. A letter has been mailed to pt's home from [facility], this was mailed on 2.28.18.

## 2016-10-02 NOTE — Telephone Encounter (Signed)
Katie Greene, Have you received any follow up forms from [employer]

## 2016-10-04 DIAGNOSIS — J449 Chronic obstructive pulmonary disease, unspecified: Secondary | ICD-10-CM | POA: Diagnosis not present

## 2016-10-06 ENCOUNTER — Encounter (HOSPITAL_COMMUNITY)
Admission: RE | Admit: 2016-10-06 | Discharge: 2016-10-06 | Disposition: A | Discharge status: Home / Self Care | Payer: Self-pay | Source: Ambulatory Visit | Attending: Internal Medicine | Admitting: Internal Medicine

## 2016-10-08 ENCOUNTER — Encounter (HOSPITAL_COMMUNITY)
Admission: RE | Admit: 2016-10-08 | Discharge: 2016-10-08 | Disposition: A | Discharge status: Home / Self Care | Payer: Self-pay | Source: Ambulatory Visit | Attending: Internal Medicine | Admitting: Internal Medicine

## 2016-10-08 ENCOUNTER — Telehealth: Payer: Self-pay | Admitting: Internal Medicine

## 2016-10-09 NOTE — Telephone Encounter (Signed)
[name]  I spoke with pt and she received the finical forms, she is just concerned that she has to get the rest of the money to be able to qualify for the patient assistance. She also mentioned that we should receive information for her disability forms. Looks like someone else sent you a message about this yesterday.

## 2016-10-09 NOTE — Telephone Encounter (Signed)
Yes, disability forms were completed and sent back [place]. Pt states she has to meet an out of pocket cost of $900 before pt assistance is able to help. Pt states she has enough medication to last her a few months and then by then she thinks she will have met her out of pocket cost. Pt states if she has not she will call back to see if we can provide samples till pt assistance comes through. Pt verbalized understanding and denied any further questions or concerns at this time.

## 2016-10-09 NOTE — Telephone Encounter (Signed)
Rec'd completed disability form back- fwd to [employer] via interoffice mail - 10/09/16 -pr

## 2016-10-09 NOTE — Telephone Encounter (Signed)
Forms received from MR, placed on Katie Greene's desk per triage protocol.

## 2016-10-13 ENCOUNTER — Encounter (HOSPITAL_COMMUNITY): Payer: Self-pay

## 2016-10-15 ENCOUNTER — Encounter (HOSPITAL_COMMUNITY)
Admission: RE | Admit: 2016-10-15 | Discharge: 2016-10-15 | Disposition: A | Discharge status: Home / Self Care | Payer: Self-pay | Source: Ambulatory Visit | Attending: Internal Medicine | Admitting: Internal Medicine

## 2016-10-19 DIAGNOSIS — I2781 Cor pulmonale (chronic): Secondary | ICD-10-CM | POA: Diagnosis not present

## 2016-10-19 DIAGNOSIS — F329 Major depressive disorder, single episode, unspecified: Secondary | ICD-10-CM | POA: Diagnosis not present

## 2016-10-19 DIAGNOSIS — L570 Actinic keratosis: Secondary | ICD-10-CM | POA: Diagnosis not present

## 2016-10-19 DIAGNOSIS — Z9981 Dependence on supplemental oxygen: Secondary | ICD-10-CM | POA: Diagnosis not present

## 2016-10-19 DIAGNOSIS — Z6829 Body mass index (BMI) 29.0-29.9, adult: Secondary | ICD-10-CM | POA: Diagnosis not present

## 2016-10-19 DIAGNOSIS — J449 Chronic obstructive pulmonary disease, unspecified: Secondary | ICD-10-CM | POA: Diagnosis not present

## 2016-10-19 DIAGNOSIS — I10 Essential (primary) hypertension: Secondary | ICD-10-CM | POA: Diagnosis not present

## 2016-10-20 ENCOUNTER — Encounter (HOSPITAL_COMMUNITY)
Admission: RE | Admit: 2016-10-20 | Discharge: 2016-10-20 | Disposition: A | Discharge status: Home / Self Care | Payer: Self-pay | Source: Ambulatory Visit | Attending: Internal Medicine | Admitting: Internal Medicine

## 2016-10-22 ENCOUNTER — Encounter (HOSPITAL_COMMUNITY)
Admission: RE | Admit: 2016-10-22 | Discharge: 2016-10-22 | Disposition: A | Discharge status: Home / Self Care | Payer: Self-pay | Source: Ambulatory Visit | Attending: Internal Medicine | Admitting: Internal Medicine

## 2016-10-24 DIAGNOSIS — J9611 Chronic respiratory failure with hypoxia: Secondary | ICD-10-CM | POA: Diagnosis not present

## 2016-10-24 DIAGNOSIS — J449 Chronic obstructive pulmonary disease, unspecified: Secondary | ICD-10-CM | POA: Diagnosis not present

## 2016-10-27 ENCOUNTER — Encounter (HOSPITAL_COMMUNITY): Payer: Self-pay

## 2016-10-29 ENCOUNTER — Encounter (HOSPITAL_COMMUNITY)
Admission: RE | Admit: 2016-10-29 | Discharge: 2016-10-29 | Disposition: A | Discharge status: Home / Self Care | Payer: Self-pay | Source: Ambulatory Visit | Attending: Internal Medicine | Admitting: Internal Medicine

## 2016-11-03 ENCOUNTER — Encounter (HOSPITAL_COMMUNITY): Payer: Self-pay

## 2016-11-03 DIAGNOSIS — J42 Unspecified chronic bronchitis: Secondary | ICD-10-CM | POA: Insufficient documentation

## 2016-11-04 DIAGNOSIS — J449 Chronic obstructive pulmonary disease, unspecified: Secondary | ICD-10-CM | POA: Diagnosis not present

## 2016-11-05 ENCOUNTER — Encounter (HOSPITAL_COMMUNITY): Payer: Self-pay

## 2016-11-10 ENCOUNTER — Encounter (HOSPITAL_COMMUNITY): Payer: Self-pay

## 2016-11-12 ENCOUNTER — Encounter (HOSPITAL_COMMUNITY): Payer: Self-pay

## 2016-11-17 ENCOUNTER — Encounter (HOSPITAL_COMMUNITY)
Admission: RE | Admit: 2016-11-17 | Discharge: 2016-11-17 | Disposition: A | Discharge status: Home / Self Care | Payer: Self-pay | Source: Ambulatory Visit | Attending: Internal Medicine | Admitting: Internal Medicine

## 2016-11-19 ENCOUNTER — Encounter (HOSPITAL_COMMUNITY)
Admission: RE | Admit: 2016-11-19 | Discharge: 2016-11-19 | Disposition: A | Discharge status: Home / Self Care | Payer: Self-pay | Source: Ambulatory Visit | Attending: Internal Medicine | Admitting: Internal Medicine

## 2016-11-24 ENCOUNTER — Encounter (HOSPITAL_COMMUNITY): Payer: Self-pay

## 2016-11-24 DIAGNOSIS — J449 Chronic obstructive pulmonary disease, unspecified: Secondary | ICD-10-CM | POA: Diagnosis not present

## 2016-11-24 DIAGNOSIS — J9611 Chronic respiratory failure with hypoxia: Secondary | ICD-10-CM | POA: Diagnosis not present

## 2016-11-25 DIAGNOSIS — L918 Other hypertrophic disorders of the skin: Secondary | ICD-10-CM | POA: Diagnosis not present

## 2016-11-25 DIAGNOSIS — D2372 Other benign neoplasm of skin of left lower limb, including hip: Secondary | ICD-10-CM | POA: Diagnosis not present

## 2016-11-25 DIAGNOSIS — D2262 Melanocytic nevi of left upper limb, including shoulder: Secondary | ICD-10-CM | POA: Diagnosis not present

## 2016-11-25 DIAGNOSIS — D2371 Other benign neoplasm of skin of right lower limb, including hip: Secondary | ICD-10-CM | POA: Diagnosis not present

## 2016-11-26 ENCOUNTER — Encounter (HOSPITAL_COMMUNITY)
Admission: RE | Admit: 2016-11-26 | Discharge: 2016-11-26 | Disposition: A | Discharge status: Home / Self Care | Payer: Self-pay | Source: Ambulatory Visit | Attending: Internal Medicine | Admitting: Internal Medicine

## 2016-12-01 ENCOUNTER — Encounter (HOSPITAL_COMMUNITY)
Admission: RE | Admit: 2016-12-01 | Discharge: 2016-12-01 | Disposition: A | Discharge status: Home / Self Care | Payer: Medicare HMO | Source: Ambulatory Visit | Attending: Internal Medicine | Admitting: Internal Medicine

## 2016-12-01 DIAGNOSIS — J42 Unspecified chronic bronchitis: Secondary | ICD-10-CM | POA: Insufficient documentation

## 2016-12-03 ENCOUNTER — Encounter (HOSPITAL_COMMUNITY): Payer: Medicare HMO

## 2016-12-04 DIAGNOSIS — J449 Chronic obstructive pulmonary disease, unspecified: Secondary | ICD-10-CM | POA: Diagnosis not present

## 2016-12-08 ENCOUNTER — Encounter (HOSPITAL_COMMUNITY)
Admission: RE | Admit: 2016-12-08 | Discharge: 2016-12-08 | Disposition: A | Discharge status: Home / Self Care | Payer: Medicare HMO | Source: Ambulatory Visit | Attending: Internal Medicine | Admitting: Internal Medicine

## 2016-12-10 ENCOUNTER — Encounter (HOSPITAL_COMMUNITY)
Admission: RE | Admit: 2016-12-10 | Discharge: 2016-12-10 | Disposition: A | Discharge status: Home / Self Care | Payer: Medicare HMO | Source: Ambulatory Visit | Attending: Internal Medicine | Admitting: Internal Medicine

## 2016-12-15 ENCOUNTER — Encounter (HOSPITAL_COMMUNITY)
Admission: RE | Admit: 2016-12-15 | Discharge: 2016-12-15 | Disposition: A | Discharge status: Home / Self Care | Payer: Medicare HMO | Source: Ambulatory Visit | Attending: Internal Medicine | Admitting: Internal Medicine

## 2016-12-17 ENCOUNTER — Encounter (HOSPITAL_COMMUNITY): Payer: Medicare HMO

## 2016-12-22 ENCOUNTER — Encounter (HOSPITAL_COMMUNITY)
Admission: RE | Admit: 2016-12-22 | Discharge: 2016-12-22 | Disposition: A | Discharge status: Home / Self Care | Payer: Medicare HMO | Source: Ambulatory Visit | Attending: Internal Medicine | Admitting: Internal Medicine

## 2016-12-24 ENCOUNTER — Encounter (HOSPITAL_COMMUNITY)
Admission: RE | Admit: 2016-12-24 | Discharge: 2016-12-24 | Disposition: A | Discharge status: Home / Self Care | Payer: Medicare HMO | Source: Ambulatory Visit | Attending: Internal Medicine | Admitting: Internal Medicine

## 2016-12-24 DIAGNOSIS — J449 Chronic obstructive pulmonary disease, unspecified: Secondary | ICD-10-CM | POA: Diagnosis not present

## 2016-12-24 DIAGNOSIS — J9611 Chronic respiratory failure with hypoxia: Secondary | ICD-10-CM | POA: Diagnosis not present

## 2016-12-29 ENCOUNTER — Encounter (HOSPITAL_COMMUNITY): Payer: Medicare HMO

## 2016-12-29 ENCOUNTER — Telehealth (HOSPITAL_COMMUNITY): Payer: Self-pay

## 2016-12-31 ENCOUNTER — Telehealth (HOSPITAL_COMMUNITY): Payer: Self-pay

## 2016-12-31 ENCOUNTER — Encounter (HOSPITAL_COMMUNITY): Payer: Medicare HMO

## 2016-12-31 ENCOUNTER — Encounter: Payer: Self-pay | Admitting: Internal Medicine

## 2016-12-31 ENCOUNTER — Ambulatory Visit (INDEPENDENT_AMBULATORY_CARE_PROVIDER_SITE_OTHER): Payer: Medicare HMO | Admitting: Internal Medicine

## 2016-12-31 DIAGNOSIS — J449 Chronic obstructive pulmonary disease, unspecified: Secondary | ICD-10-CM | POA: Diagnosis not present

## 2016-12-31 NOTE — Progress Notes (Signed)
Subjective:     Patient ID: Katie Greene, female   DOB: [birth year], 61 y.o.   MRN: [id number]  HPI    OV 02/18/2016  Chief Complaint  Patient presents with  . Follow-up    pt c/o wheezing, runny nose worse qhs- tries to stay indoors d/t heat and humidity worsening her SOB.  Pt requesting an order for a wheelchair.     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 The summer heat is bothering her and she is trying to stay indoors. In accounting for the heat she does not seem to be in an exacerbation. Her sister passed away and she was planning to move to [place] to be with her daughter but these plans of change. She inherited some large piece of land. She's disposing that right now and with that she'll be able to afford living and [place]. Early August 2017 she is going to trip to [place] with her daughter for a family reunion and medication. She wants a wheelchair.  OV 05/26/2016  Chief Complaint  Patient presents with  . Follow-up    Pt states her SOB has slightly worsened since last OV in 02/2016. Pt states her cough is at baseline - with little mucus production with yellow to green mucus. Pt states her appetite has decreased. Pt denies CP/tightness and f/c/s.    Goald 3- 4 3. Chronic hypoxemic respilure.Presents for routine follow-up. She visited [place], [place] for a family reunion in August 2017. This went well without any exacerbation. Overall she is doing well. Shortness of breath and cough is at baseline. There no new issues. She now has a wheelchair. She uses 3 L of oxygen at baseline but sometimes s2    OV 09/02/2016  Chief Complaint  Patient presents with  . Follow-up    Pt states overall her breathing is unchanged. Pt c/o hoarseness. Pt deneis significant cough and CP/tightness.    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 Presents for routine follow-up. At this point in time COPD stable. She is  compliant with the Spiriva and Symbicort. She is 3 L of oxygen at baseline. She's undergoing grief counseling because of her sister's death. There are no new issues she is up-to-date with flu shot. She wants me to fill out some financial assistance form for a Symbicort    OV 12/31/2016  Chief Complaint  Patient presents with  . Follow-up    Pt states her SOB has slightly more SOB. Pt c/o prod cough with little mucus - green in color. Pt denies CP/tightness.    Follow-up Gold stage IV COPD. She is on triple inhaler therapy with Spiriva and Symbicort. She says this is working well for her. There are no new issues. She feels COPD is best ever. She recently in April 2018 separated from her husband and she feels emotionally better as a result. CAT score is 27 and is baseline. USes o2 3L [place]    CAT COPD Symptom & Quality of Life Score (GSK trademark) 0 is no burden. 5 is highest burden 12/31/2016   Never Cough -> Cough all the time 3  No phlegm in chest -> Chest is full of phlegm 3  No chest tightness -> Chest feels very tight 3  No dyspnea for 1 flight stairs/hill -> Very dyspneic for 1 flight of stairs 5  No limitations for ADL at home -> Very limited with ADL at home 5  Confident leaving home ->  Not at all confident leaving home 2  Sleep soundly -> Do not sleep soundly because of lung condition 2  Lots of Energy -> No energy at all 4  TOTAL Score (max 40)  27        has a past medical history of Anxiety; Arthritis; Chest pain; COPD (chronic obstructive pulmonary disease) ([place]); Depression; Fatigue; GERD (gastroesophageal reflux disease); Hemorrhoids; Hyperlipidemia; Hypertension; On home oxygen therapy; and Palpitations.   reports that she quit smoking about 4 years ago. Her smoking use included Cigarettes. She has a 12.50 pack-year smoking history. She has never used smokeless tobacco.  Past Surgical History:  Procedure Laterality Date  . CHOLECYSTECTOMY     laparosopic attempted,  then had to open  . COLONOSCOPY     x2 with polyps removed  . COLONOSCOPY N/A 08/27/2015   Procedure: COLONOSCOPY;  Surgeon: Manus Gunning, MD;  Location: [facility];  Service: Gastroenterology;  Laterality: N/A;  . oopherectomy     1 ovary removed only  . POLYPECTOMY    . SALPINGECTOMY     1 fallopian tube removed    No Known Allergies  Immunization History  Administered Date(s) Administered  . Influenza Split 06/10/2012, 04/03/2014, 04/20/2015  . Influenza,inj,Quad PF,36+ Mos 04/24/2013, 03/03/2016  . Pneumococcal Conjugate-13 05/07/2015  . Pneumococcal Polysaccharide-23 06/10/2012  . Zoster 05/10/2014    Family History  Problem Relation Age of Onset  . Heart attack Mother   . Pancreatic cancer Mother   . Prostate cancer Father   . Hypertension Father   . Lung cancer Father   . Throat cancer Brother        Throat Cancer  . Colon cancer Sister      Current Outpatient Prescriptions:  .  Acetylcysteine 600 MG CAPS, Take 1 capsule by mouth 2 (two) times daily., Disp: , Rfl:  .  ALPRAZolam (XANAX) 0.5 MG tablet, Take 0.5 mg by mouth 3 (three) times daily as needed for anxiety. , Disp: , Rfl:  .  aspirin EC 81 MG tablet, Take 81 mg by mouth every morning. , Disp: , Rfl:  .  budesonide-formoterol (SYMBICORT) 160-4.5 MCG/ACT inhaler, Inhale 2 puffs into the lungs 2 (two) times daily., Disp: 1 Inhaler, Rfl: 6 .  buPROPion (WELLBUTRIN SR) 100 MG 12 hr tablet, Take 100 mg by mouth every morning. , Disp: , Rfl:  .  cetirizine (ZYRTEC) 10 MG tablet, Take 10 mg by mouth at bedtime., Disp: , Rfl:  .  cholecalciferol (VITAMIN D) 1000 UNITS tablet, Take 1,000 Units by mouth daily., Disp: , Rfl:  .  diltiazem (CARDIZEM CD) 180 MG 24 hr capsule, Take 180 mg by mouth every morning. , Disp: , Rfl:  .  ferrous sulfate 325 (65 FE) MG tablet, Take 325 mg by mouth daily with breakfast., Disp: , Rfl:  .  fluticasone (FLONASE) 50 MCG/ACT nasal spray, Place 2 sprays into both nostrils  daily., Disp: 16 g, Rfl: 5 .  hydrochlorothiazide (HYDRODIURIL) 25 MG tablet, Take 25 mg by mouth every morning. , Disp: , Rfl:  .  meloxicam (MOBIC) 15 MG tablet, Take 15 mg by mouth daily., Disp: , Rfl:  .  metoprolol succinate (TOPROL-XL) 50 MG 24 hr tablet, Take 50 mg by mouth every morning. Take with or immediately following a meal., Disp: , Rfl:  .  omeprazole (PRILOSEC) 20 MG capsule, Take 20 mg by mouth daily. , Disp: , Rfl:  .  OVER THE COUNTER MEDICATION, Hemp oil alternating between CBD oil (DAILY),  Disp: , Rfl:  .  OVER THE COUNTER MEDICATION, CBD oil - alternating between hemp oil and CBD oil daily, Disp: , Rfl:  .  OXYGEN, Inhale 2-3 L into the lungs daily., Disp: , Rfl:  .  polyethylene glycol (MIRALAX / GLYCOLAX) packet, Take 17 g by mouth daily., Disp: , Rfl:  .  pravastatin (PRAVACHOL) 80 MG tablet, Take 80 mg by mouth every morning. , Disp: , Rfl:  .  spironolactone (ALDACTONE) 50 MG tablet, Take 25 mg by mouth every morning. , Disp: , Rfl:  .  temazepam (RESTORIL) 30 MG capsule, Take 30 mg by mouth at bedtime as needed for sleep. , Disp: , Rfl:  .  umeclidinium bromide (INCRUSE ELLIPTA) 62.5 MCG/INH AEPB, Inhale 1 puff into the lungs daily., Disp: 90 each, Rfl: 1 .  VENTOLIN HFA 108 (90 Base) MCG/ACT inhaler, INHALE 2 PUFFS INTO THE LUNGS EVERY 6 (SIX) HOURS AS NEEDED., Disp: 18 g, Rfl: 2   Review of Systems     Objective:   Physical Exam  Constitutional: She is oriented to person, place, and time. She appears well-developed and well-nourished. No distress.  HENT:  Head: Normocephalic and atraumatic.  Right Ear: External ear normal.  Left Ear: External ear normal.  Mouth/Throat: Oropharynx is clear and moist. No oropharyngeal exudate.  Eyes: Conjunctivae and EOM are normal. Pupils are equal, round, and reactive to light. Right eye exhibits no discharge. Left eye exhibits no discharge. No scleral icterus.  Neck: Normal range of motion. Neck supple. No JVD present. No  tracheal deviation present. No thyromegaly present.  Cardiovascular: Normal rate, regular rhythm, normal heart sounds and intact distal pulses.  Exam reveals no gallop and no friction rub.   No murmur heard. Pulmonary/Chest: Effort normal and breath sounds normal. No respiratory distress. She has no wheezes. She has no rales. She exhibits no tenderness.  barrell chest + Very rare faint wheeze in lung base +   Abdominal: Soft. Bowel sounds are normal. She exhibits no distension and no mass. There is no tenderness. There is no rebound and no guarding.  Musculoskeletal: Normal range of motion. She exhibits no edema or tenderness.  Lymphadenopathy:    She has no cervical adenopathy.  Neurological: She is alert and oriented to person, place, and time. She has normal reflexes. No cranial nerve deficit. She exhibits normal muscle tone. Coordination normal.  Skin: Skin is warm and dry. No rash noted. She is not diaphoretic. No erythema. No pallor.  Psychiatric: She has a normal mood and affect. Her behavior is normal. Judgment and thought content normal.  Vitals reviewed.  Vitals:   12/31/16 1146 12/31/16 1147  BP: 112/86   Pulse: 100   SpO2: (!) 81% (!) 84%  Weight: 177 lb 6.4 oz (80.5 kg)   Height: 5\' 7"  (1.702 m)    Estimated body mass index is 27.78 kg/m as calculated from the following:   Height as of this encounter: 5\' 7"  (1.702 m).   Weight as of this encounter: 177 lb 6.4 oz (80.5 kg).     Assessment:       ICD-9-CM ICD-10-CM   1. COPD, severe ([place]) 496 J44.9        Plan:     COPD, severe Stable disease  Plan Continue o2, incruse and symbicort scheduled Use albuterol as needed Continue to be as active as you can Flu shot in fall  Followup 6 months or sooner if needed    Dr. Brand Males, M.D., [facility].C.P  Pulmonary and Critical Care Medicine Staff Physician [place] [facility] Pager: [phone], If no answer or between   15:00h - 7:00h: call [phone]  12/31/2016 11:59 AM

## 2016-12-31 NOTE — Patient Instructions (Signed)
COPD, severe Stable disease  Plan Continue o2, incruse and symbicort scheduled Use albuterol as needed Continue to be as active as you can Flu shot in fall  Followup 6 months or sooner if needed

## 2016-12-31 NOTE — Assessment & Plan Note (Signed)
Stable disease  Plan Continue o2, incruse and symbicort scheduled Use albuterol as needed Continue to be as active as you can Flu shot in fall  Followup 6 months or sooner if needed

## 2017-01-04 ENCOUNTER — Telehealth: Payer: Self-pay | Admitting: Internal Medicine

## 2017-01-04 DIAGNOSIS — J449 Chronic obstructive pulmonary disease, unspecified: Secondary | ICD-10-CM | POA: Diagnosis not present

## 2017-01-04 MED ORDER — UMECLIDINIUM BROMIDE 62.5 MCG/INH IN AEPB: 1.0000 | INHALATION_SPRAY | Freq: Every day | RESPIRATORY_TRACT | 11 refills | Status: DC

## 2017-01-04 NOTE — Telephone Encounter (Signed)
Spoke with the pt  She states that she is needing 30 day supply Incruse sent to [place] advised typically [employer] needs a 90 days supply, but she wanted 30 and states she already discussed this with the pharmacist there  Rx was sent and nothing further needed

## 2017-01-05 ENCOUNTER — Encounter (HOSPITAL_COMMUNITY)
Admission: RE | Admit: 2017-01-05 | Discharge: 2017-01-05 | Disposition: A | Discharge status: Home / Self Care | Payer: Self-pay | Source: Ambulatory Visit | Attending: Internal Medicine | Admitting: Internal Medicine

## 2017-01-05 DIAGNOSIS — J42 Unspecified chronic bronchitis: Secondary | ICD-10-CM | POA: Insufficient documentation

## 2017-01-07 ENCOUNTER — Encounter (HOSPITAL_COMMUNITY)
Admission: RE | Admit: 2017-01-07 | Discharge: 2017-01-07 | Disposition: A | Discharge status: Home / Self Care | Payer: Self-pay | Source: Ambulatory Visit | Attending: Internal Medicine | Admitting: Internal Medicine

## 2017-01-12 ENCOUNTER — Encounter (HOSPITAL_COMMUNITY): Payer: Self-pay

## 2017-01-14 ENCOUNTER — Encounter (HOSPITAL_COMMUNITY): Payer: Self-pay

## 2017-01-19 ENCOUNTER — Encounter (HOSPITAL_COMMUNITY): Payer: Self-pay

## 2017-01-21 ENCOUNTER — Encounter (HOSPITAL_COMMUNITY): Payer: Self-pay

## 2017-01-22 DIAGNOSIS — I2781 Cor pulmonale (chronic): Secondary | ICD-10-CM | POA: Diagnosis not present

## 2017-01-22 DIAGNOSIS — I10 Essential (primary) hypertension: Secondary | ICD-10-CM | POA: Diagnosis not present

## 2017-01-22 DIAGNOSIS — J449 Chronic obstructive pulmonary disease, unspecified: Secondary | ICD-10-CM | POA: Diagnosis not present

## 2017-01-22 DIAGNOSIS — Z6828 Body mass index (BMI) 28.0-28.9, adult: Secondary | ICD-10-CM | POA: Diagnosis not present

## 2017-01-22 DIAGNOSIS — Z1389 Encounter for screening for other disorder: Secondary | ICD-10-CM | POA: Diagnosis not present

## 2017-01-22 DIAGNOSIS — M545 Low back pain: Secondary | ICD-10-CM | POA: Diagnosis not present

## 2017-01-24 DIAGNOSIS — J9611 Chronic respiratory failure with hypoxia: Secondary | ICD-10-CM | POA: Diagnosis not present

## 2017-01-24 DIAGNOSIS — J449 Chronic obstructive pulmonary disease, unspecified: Secondary | ICD-10-CM | POA: Diagnosis not present

## 2017-01-26 ENCOUNTER — Encounter (HOSPITAL_COMMUNITY)
Admission: RE | Admit: 2017-01-26 | Discharge: 2017-01-26 | Disposition: A | Discharge status: Home / Self Care | Payer: Self-pay | Source: Ambulatory Visit | Attending: Internal Medicine | Admitting: Internal Medicine

## 2017-01-28 ENCOUNTER — Encounter (HOSPITAL_COMMUNITY)
Admission: RE | Admit: 2017-01-28 | Discharge: 2017-01-28 | Disposition: A | Discharge status: Home / Self Care | Payer: Medicare HMO | Source: Ambulatory Visit | Attending: Internal Medicine | Admitting: Internal Medicine

## 2017-02-02 ENCOUNTER — Encounter (HOSPITAL_COMMUNITY)
Admission: RE | Admit: 2017-02-02 | Discharge: 2017-02-02 | Disposition: A | Discharge status: Home / Self Care | Payer: Self-pay | Source: Ambulatory Visit | Attending: Internal Medicine | Admitting: Internal Medicine

## 2017-02-02 DIAGNOSIS — J42 Unspecified chronic bronchitis: Secondary | ICD-10-CM | POA: Insufficient documentation

## 2017-02-03 DIAGNOSIS — J449 Chronic obstructive pulmonary disease, unspecified: Secondary | ICD-10-CM | POA: Diagnosis not present

## 2017-02-04 ENCOUNTER — Encounter (HOSPITAL_COMMUNITY)
Admission: RE | Admit: 2017-02-04 | Discharge: 2017-02-04 | Disposition: A | Discharge status: Home / Self Care | Payer: Self-pay | Source: Ambulatory Visit | Attending: Internal Medicine | Admitting: Internal Medicine

## 2017-02-09 ENCOUNTER — Encounter (HOSPITAL_COMMUNITY)
Admission: RE | Admit: 2017-02-09 | Discharge: 2017-02-09 | Disposition: A | Discharge status: Home / Self Care | Payer: Self-pay | Source: Ambulatory Visit | Attending: Internal Medicine | Admitting: Internal Medicine

## 2017-02-16 ENCOUNTER — Telehealth: Payer: Self-pay | Admitting: Internal Medicine

## 2017-02-16 DIAGNOSIS — J9611 Chronic respiratory failure with hypoxia: Secondary | ICD-10-CM

## 2017-02-16 NOTE — Telephone Encounter (Signed)
Spoke with pt, states that she does not have a connecter for her home concentrator to connect her cannula to her concentrator- has been having to use her portable tanks.  Pt is requesting an order for the connector to be sent to below verified DME company.  This order has been placed.  Nothing further needed.

## 2017-02-22 ENCOUNTER — Encounter (INDEPENDENT_AMBULATORY_CARE_PROVIDER_SITE_OTHER): Payer: Self-pay | Admitting: Physical Medicine and Rehabilitation

## 2017-02-22 ENCOUNTER — Ambulatory Visit (INDEPENDENT_AMBULATORY_CARE_PROVIDER_SITE_OTHER): Payer: Medicare HMO

## 2017-02-22 ENCOUNTER — Ambulatory Visit (INDEPENDENT_AMBULATORY_CARE_PROVIDER_SITE_OTHER): Payer: Medicare HMO | Admitting: Physical Medicine and Rehabilitation

## 2017-02-22 VITALS — BP 135/90 | HR 110

## 2017-02-22 DIAGNOSIS — R202 Paresthesia of skin: Secondary | ICD-10-CM | POA: Diagnosis not present

## 2017-02-22 DIAGNOSIS — M47816 Spondylosis without myelopathy or radiculopathy, lumbar region: Secondary | ICD-10-CM

## 2017-02-22 DIAGNOSIS — M25552 Pain in left hip: Secondary | ICD-10-CM

## 2017-02-22 DIAGNOSIS — M5416 Radiculopathy, lumbar region: Secondary | ICD-10-CM

## 2017-02-22 DIAGNOSIS — M5442 Lumbago with sciatica, left side: Secondary | ICD-10-CM

## 2017-02-22 DIAGNOSIS — M419 Scoliosis, unspecified: Secondary | ICD-10-CM | POA: Diagnosis not present

## 2017-02-22 DIAGNOSIS — G8929 Other chronic pain: Secondary | ICD-10-CM | POA: Diagnosis not present

## 2017-02-22 MED ORDER — LIDOCAINE HCL (PF) 1 % IJ SOLN
2.0000 mL | Freq: Once | INTRAMUSCULAR | Status: AC
  Administered 2017-02-22: 2 mL

## 2017-02-22 MED ORDER — METHYLPREDNISOLONE ACETATE 80 MG/ML IJ SUSP
80.0000 mg | Freq: Once | INTRAMUSCULAR | Status: AC
  Administered 2017-02-22: 80 mg

## 2017-02-22 NOTE — Progress Notes (Deleted)
Left side low back for several months. Radiates down back of left leg to knee. Worse with walking and standing. Some relief with sitting. Numbness and tingling in left thigh with sleeping on left side.

## 2017-02-22 NOTE — Progress Notes (Signed)
Katie Greene - 61 y.o. female MRN [id number]  Date of birth: 05-09-1956  Office Visit Note: Visit Date: 02/22/2017 PCP: Leanna Battles, MD Referred by: Leanna Battles, MD  Subjective: Chief Complaint  Patient presents with  . Lower Back - Pain   HPI: Katie Greene is a 61 year old female that I last saw in 2015 at the request of Dr. Philip Aspen her primary care physician. At that time he wanted me to see her for chronic low back pain but in the office she had a multitude of somatic pain complaints of neck pain and shoulder pain knee pain and back pain. Her brief history is that she saw Dr. Sherley Bounds the neurosurgeon in 2012 and he completed MRI and myelogram of her lumbar spine. These images are reviewed below but showed some listhesis and facet arthropathy without significant stenosis or nerve compression. She went on at the time to complete epidural-type injections with either Dr. Brien Few or Dr. Maryjean Ka through their office and she feels like they were mildly beneficial. When I saw her for her back pain it was really one of the least issues at the time and we did not complete any interventional procedures on her back at the time. She also saw Dr. Ninfa Linden in our office at that point for her hip and he did not deem is to be really a significant hip issue. She had fairly normal hips at that time. She comes in today complaining of several months of left-sided low back pain that refers to the anterior lateral part of the hip and to the left leg to the knee somewhat anterior and laterally. This is worse with walking and standing. She does get relief with sitting. She feels like maybe there is some numbness and tingling in the left thigh when sleeping on that left side. She reports is hard to sleep on that side due to the pain. She has not had any new spine imaging. She has not had recent x-rays of the pelvis or hips. She is getting some referral pattern into the groin at times on the left. She does not  endorse any tingling numbness down to the feet. Her case is complicated by significant COPD and she is on supplemental oxygen. She does not endorse any recent trauma or falls. She did not endorse any focal weakness. She's had no changes in bowel or bladder symptoms.    Review of Systems  Constitutional: Negative for chills, fever, malaise/fatigue and weight loss.  HENT: Negative for hearing loss and sinus pain.   Eyes: Negative for blurred vision, double vision and photophobia.  Respiratory: Negative for cough and shortness of breath.   Cardiovascular: Negative for chest pain, palpitations and leg swelling.  Gastrointestinal: Negative for abdominal pain, nausea and vomiting.  Genitourinary: Negative for flank pain.  Musculoskeletal: Positive for back pain and joint pain. Negative for myalgias.  Skin: Negative for itching and rash.  Neurological: Positive for tingling. Negative for tremors, focal weakness and weakness.  Endo/Heme/Allergies: Negative.   Psychiatric/Behavioral: Negative for depression.  All other systems reviewed and are negative.  Otherwise per HPI.  Assessment & Plan: Visit Diagnoses:  1. Spondylosis without myelopathy or radiculopathy, lumbar region   2. Scoliosis of thoracolumbar spine, unspecified scoliosis type   3. Chronic left-sided low back pain with left-sided sciatica   4. Paresthesia of skin   5. Pain in left hip   6. Lumbar radiculopathy     Plan: Findings:  Chronic worsening low back and left low  back and particular pain radiating into the hip and anterior lateral and posterior laterally to the knee with sometimes numbness and tingling. Again the patient hasn't seen her a few years ago has various somatic type physical complaints. I think there is an underlying anxiety issues and possibly fibromyalgia or myofascial pain. Prior MRI and myelogram did show significant facet arthropathy at L4-5 but without significant central stenosis. I think her back pain and  hip pain really is related to the facet joints probably at that level but she is getting a radicular type pain. We also felt today that she was having potential hip issues once again and I did obtain another x-ray of her hips bilaterally which really show very minimal degenerative change. I think the best approach is epidural injection one time to see if that helps. If it doesn't help greatly I would look at either facet joint block or updating her MRI of her lumbar spine. She would not be a great candidate for prolonged opioid medication with a level of pain that she is having and her other medical issues. She likely would do well regrouping with physical therapy for a short course if we can identify significant pain generator. I spent more than 30 minutes speaking face-to-face with the patient with 50% of the time in counseling. Epidural injection was performed today.    Meds & Orders:  Meds ordered this encounter  Medications  . lidocaine (PF) (XYLOCAINE) 1 % injection 2 mL  . methylPREDNISolone acetate (DEPO-MEDROL) injection 80 mg    Orders Placed This Encounter  Procedures  . XR HIP UNILAT W OR W/O PELVIS 2-3 VIEWS LEFT  . XR C-ARM NO REPORT  . Epidural Steroid injection    Follow-up: Return if symptoms worsen or fail to improve.   Procedures: No procedures performed  Lumbar Epidural Steroid Injection - Interlaminar Approach with Fluoroscopic Guidance  Patient: Katie Greene      Date of Birth: [birth year] MRN: [id number] PCP: Leanna Battles, MD      Visit Date: 02/22/2017   Universal Protocol:    Date/Time: 07/25/186:31 AM  Consent Given By: the patient  Position: PRONE  Additional Comments: Vital signs were monitored before and after the procedure. Patient was prepped and draped in the usual sterile fashion. The correct patient, procedure, and site was verified.   Injection Procedure Details:  Procedure Site One Meds Administered:  Meds ordered this encounter    Medications  . lidocaine (PF) (XYLOCAINE) 1 % injection 2 mL  . methylPREDNISolone acetate (DEPO-MEDROL) injection 80 mg     Laterality: Left  Location/Site:  L5-S1  Needle size: 20 G  Needle type: Tuohy  Needle Placement: Paramedian epidural  Findings:  -Contrast Used: 1 mL iohexol 180 mg iodine/mL   -Comments: Excellent flow of contrast into the epidural space.  Procedure Details: Using a paramedian approach from the side mentioned above, the region overlying the inferior lamina was localized under fluoroscopic visualization and the soft tissues overlying this structure were infiltrated with 4 ml. of 1% Lidocaine without Epinephrine. The Tuohy needle was inserted into the epidural space using a paramedian approach.   The epidural space was localized using loss of resistance along with lateral and bi-planar fluoroscopic views.  After negative aspirate for air, blood, and CSF, a 2 ml. volume of Isovue-250 was injected into the epidural space and the flow of contrast was observed. Radiographs were obtained for documentation purposes.    The injectate was administered into the level noted above.  Additional Comments:  The patient tolerated the procedure well No complications occurred Dressing: Band-Aid    Post-procedure details: Patient was observed during the procedure. Post-procedure instructions were reviewed.  Patient left the clinic in stable condition.    Clinical History: 12/03/2010 CT MYELOGRAPHY LUMBAR SPINE  Technique:  CT imaging of the lumbar spine was performed after intrathecal contrast administration.  Multiplanar CT image reconstructions were also generated.  Findings:  The lumbar spine is imaged from midbody of T12-S3.  The conus medullaris terminates at the lower limits of normal, L2-3. The vertebral body heights maintained.  Slight anterolisthesis is present at L4-5 with some uncovering of the disc.  Minimal rightward curvature of the lumbar  spine is evident.  The individual disc levels are as follows.  The disc levels at L2-3 above are normal.  L3-4:  Mild facet hypertrophy is worse on the right.  There is no significant stenosis.  L4-5:  Slight anterolisthesis is evident.  Advanced facet arthropathy is worse on the right.  The facet spurring results in mild right foraminal narrowing.  The disc bulge is slightly asymmetric to the left without significant stenosis.  L5-S1:  No significant disc herniation or focal stenosis is evident.  Mild facet arthropathy is present.  IMPRESSION:  1.  Advanced facet hypertrophy and grade 1 anterolisthesis at L4-5. 2.  Mild right foraminal narrowing secondary to facet spurring at L4-5. 3.  Mild leftward disc bulging at L4-5 without significant disc related stenosis. 4.  Mild facet hypertrophy L3-4 and L5-S1 without significant stenosis at either level.  09/15/10 IMPRESSION: 1. L4-L5 grade 1 anterolisthesis secondary to right greater than left bilateral facet arthrosis. There is mild right foraminal encroachment at L4-L5, potentially affecting the right L4 nerve root although the clinical significance is unclear in this patient with predominately left-sided symptoms. 2. Mild L5-S1 disc degeneration and shallow broad-based bulge without significant stenosis.  She reports that she quit smoking about 4 years ago. Her smoking use included Cigarettes. She has a 12.50 pack-year smoking history. She has never used smokeless tobacco. No results for input(s): HGBA1C, LABURIC in the last 8760 hours.  Objective:  VS:  HT:    WT:   BMI:     BP:135/90  HR:(!) 110bpm  TEMP: ( )  RESP:97 % Physical Exam  Constitutional: She is oriented to person, place, and time. She appears well-developed and well-nourished. No distress.  HENT:  Head: Normocephalic and atraumatic.  Nose: Nose normal.  Mouth/Throat: Oropharynx is clear and moist.  Eyes: Pupils are equal, round, and reactive to  light. Conjunctivae are normal.  Neck: Normal range of motion. Neck supple.  Cardiovascular: Regular rhythm and intact distal pulses.   Pulmonary/Chest: Effort normal. No respiratory distress.  Patient is on oxygen by nasal cannula  Abdominal: Soft. She exhibits no distension. There is no guarding.  Musculoskeletal:  Patient has difficulty arising from a seated position. She has good distal strength. She has equivocal pain with rotation of the hip with internal rotation and external rotation. She has a negative slump test bilaterally. She does have some tenderness over the greater trochanters bilaterally and some tenderness across the lower spine. She has concordant pain with extension of the low back.  Neurological: She is alert and oriented to person, place, and time. She exhibits normal muscle tone. Coordination normal.  Skin: Skin is warm. No rash noted. No erythema.  Psychiatric: She has a normal mood and affect. Her behavior is normal.  Nursing note and vitals reviewed.  Ortho Exam Imaging: No results found.  Past Medical/Family/Surgical/Social History: Medications & Allergies reviewed per EMR Patient Active Problem List   Diagnosis Date Noted  . Blood in stool   . History of colonic polyps   . Dyspnea and respiratory abnormality 12/12/2014  . Patient in clinical research study 08/23/2014  . COPD, severe ([place]) 08/23/2014  . Research exam 01/04/2014  . Leg cramps 09/28/2013  . Chronic respiratory failure ([place]) 09/28/2013  . Postnasal drip 12/09/2012  . Hemoptysis 09/22/2012  . COPD exacerbation ([place]) 09/22/2012  . History of smoking 09/22/2012  . Smoking history 06/29/2012  . Cancer screening 06/29/2012  . COPD (chronic obstructive pulmonary disease) ([place]) 06/12/2012   Past Medical History:  Diagnosis Date  . Anxiety   . Arthritis   . Chest pain    Improved  . 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 Depression   . Fatigue   . GERD  (gastroesophageal reflux disease)   . Hemorrhoids   . Hyperlipidemia   . Hypertension   . On home oxygen therapy    oxygen therapy 2-3 L/m nasally 24/7  . Palpitations    Family History  Problem Relation Age of Onset  . Heart attack Mother   . Pancreatic cancer Mother   . Prostate cancer Father   . Hypertension Father   . Lung cancer Father   . Throat cancer Brother        Throat Cancer  . Colon cancer Sister    Past Surgical History:  Procedure Laterality Date  . CHOLECYSTECTOMY     laparosopic attempted, then had to open  . COLONOSCOPY     x2 with polyps removed  . COLONOSCOPY N/A 08/27/2015   Procedure: COLONOSCOPY;  Surgeon: Manus Gunning, MD;  Location: [facility];  Service: Gastroenterology;  Laterality: N/A;  . oopherectomy     1 ovary removed only  . POLYPECTOMY    . SALPINGECTOMY     1 fallopian tube removed   Social History   Occupational History  . Retired      Market researcher    Social History Main Topics  . Smoking status: Former Smoker    Packs/day: 0.50    Years: 25.00    Types: Cigarettes    Quit date: 03/02/2012  . Smokeless tobacco: Never Used     Comment: pt states she smoked some and stopped again 8-13.  Marland Kitchen Alcohol use No  . Drug use: No  . Sexual activity: Not on file

## 2017-02-22 NOTE — Progress Notes (Unsigned)
Fluoro Time:  13  MGy:  12.12

## 2017-02-22 NOTE — Patient Instructions (Signed)

## 2017-02-23 ENCOUNTER — Telehealth (INDEPENDENT_AMBULATORY_CARE_PROVIDER_SITE_OTHER): Payer: Self-pay | Admitting: Radiology

## 2017-02-23 DIAGNOSIS — J9611 Chronic respiratory failure with hypoxia: Secondary | ICD-10-CM | POA: Diagnosis not present

## 2017-02-23 DIAGNOSIS — J449 Chronic obstructive pulmonary disease, unspecified: Secondary | ICD-10-CM | POA: Diagnosis not present

## 2017-02-23 NOTE — Telephone Encounter (Signed)
FYI  Patient called and lmom that she was having a mild headache.  -------- I called her back and advised to take whatever she would normally take for her headaches.  She states that she was also having increased pain in her back last night.  I told her to take what she normally would for the pain and to use the ice 20 mins on and 20 mins off.  She states that she understands and that she was doing better now this evening.  I advised that if she needs anything to call us back.

## 2017-02-24 ENCOUNTER — Encounter (INDEPENDENT_AMBULATORY_CARE_PROVIDER_SITE_OTHER): Payer: Self-pay | Admitting: Physical Medicine and Rehabilitation

## 2017-02-24 NOTE — Telephone Encounter (Signed)
I concur, thanks

## 2017-02-24 NOTE — Procedures (Signed)
Lumbar Epidural Steroid Injection - Interlaminar Approach with Fluoroscopic Guidance  Patient: Katie Greene      Date of Birth: 1956/07/25 MRN: [id number] PCP: Leanna Battles, MD      Visit Date: 02/22/2017   Universal Protocol:    Date/Time: 07/25/186:31 AM  Consent Given By: the patient  Position: PRONE  Additional Comments: Vital signs were monitored before and after the procedure. Patient was prepped and draped in the usual sterile fashion. The correct patient, procedure, and site was verified.   Injection Procedure Details:  Procedure Site One Meds Administered:  Meds ordered this encounter  Medications  . lidocaine (PF) (XYLOCAINE) 1 % injection 2 mL  . methylPREDNISolone acetate (DEPO-MEDROL) injection 80 mg     Laterality: Left  Location/Site:  L5-S1  Needle size: 20 G  Needle type: Tuohy  Needle Placement: Paramedian epidural  Findings:  -Contrast Used: 1 mL iohexol 180 mg iodine/mL   -Comments: Excellent flow of contrast into the epidural space.  Procedure Details: Using a paramedian approach from the side mentioned above, the region overlying the inferior lamina was localized under fluoroscopic visualization and the soft tissues overlying this structure were infiltrated with 4 ml. of 1% Lidocaine without Epinephrine. The Tuohy needle was inserted into the epidural space using a paramedian approach.   The epidural space was localized using loss of resistance along with lateral and bi-planar fluoroscopic views.  After negative aspirate for air, blood, and CSF, a 2 ml. volume of Isovue-250 was injected into the epidural space and the flow of contrast was observed. Radiographs were obtained for documentation purposes.    The injectate was administered into the level noted above.   Additional Comments:  The patient tolerated the procedure well No complications occurred Dressing: Band-Aid    Post-procedure details: Patient was observed during the  procedure. Post-procedure instructions were reviewed.  Patient left the clinic in stable condition.

## 2017-03-02 ENCOUNTER — Encounter: Payer: Self-pay | Admitting: Internal Medicine

## 2017-03-02 NOTE — Telephone Encounter (Signed)
See if you can give  A)  incruse/tudorza/spiriva sample + Breo (any dose) /symbicort/dulera  B) stiolto + arnuity/pulmicort  C) trelegy  Total sample for 2 months  Also currenty mild flare up  cephalexin 500mg  three times daily x  5 days   Please take prednisone 40 mg x1 day, then 30 mg x1 day, then 20 mg x1 day, then 10 mg x1 day, and then 5 mg x1 day and stop   Dr. Brand Males, M.D., [facility].C.P Pulmonary and Critical Care Medicine Staff Physician [facility] Pager: [phone], If no answer or between  15:00h - 7:00h: call [phone]  03/02/2017 4:43 PM

## 2017-03-02 NOTE — Telephone Encounter (Signed)
Pt sent an email in and stated that she has a cough with white to green sputum. She is using mucinex DM with plenty of water.  She stated that she is getting assistance with DSS but he medicaid is taking a little longer.  She has ventolin left and she is currently working to pay off her bill for the pharmacy.  She wanted to see if there is anything else that we may be able to help her out with .  MR please advise. Thanks

## 2017-03-03 ENCOUNTER — Telehealth: Payer: Self-pay | Admitting: Internal Medicine

## 2017-03-03 MED ORDER — PREDNISONE 10 MG PO TABS: ORAL_TABLET | ORAL | 0 refills | Status: DC

## 2017-03-03 MED ORDER — CEPHALEXIN 500 MG PO CAPS: 500.0000 mg | ORAL_CAPSULE | Freq: Three times a day (TID) | ORAL | 0 refills | Status: DC

## 2017-03-03 NOTE — Telephone Encounter (Signed)
Pt given her samples of Trelegy and was instructed on how to use  Nothing further needed

## 2017-03-04 ENCOUNTER — Telehealth (HOSPITAL_COMMUNITY): Payer: Self-pay | Admitting: *Deleted

## 2017-03-04 ENCOUNTER — Encounter (HOSPITAL_COMMUNITY): Admission: RE | Admit: 2017-03-04 | Payer: Self-pay | Source: Ambulatory Visit

## 2017-03-06 DIAGNOSIS — J449 Chronic obstructive pulmonary disease, unspecified: Secondary | ICD-10-CM | POA: Diagnosis not present

## 2017-03-08 NOTE — Telephone Encounter (Signed)
MR please advise on additional email sent in by patient: ---------- Good Morning. Hope all is well. I would like to thank you all for all you did for me. I appreciate it very much.I have taken all the medications as of yesterday,but still have a cough which is dry and I really have to make an effort to get it up. Could there be something else going on? It bothers me because I just had a friend's son pass from what was suppose to be a cold and he had walking pneumonia. Mucus still not clear all the time,but still thick once I get it up. Still no fever which is good I hope. Just concerned.  Thanks,  Katie Greene

## 2017-03-09 ENCOUNTER — Encounter (HOSPITAL_COMMUNITY): Admission: RE | Admit: 2017-03-09 | Payer: Self-pay | Source: Ambulatory Visit

## 2017-03-09 ENCOUNTER — Ambulatory Visit (INDEPENDENT_AMBULATORY_CARE_PROVIDER_SITE_OTHER): Payer: Medicare HMO | Admitting: Internal Medicine

## 2017-03-09 ENCOUNTER — Ambulatory Visit (INDEPENDENT_AMBULATORY_CARE_PROVIDER_SITE_OTHER)
Admission: RE | Admit: 2017-03-09 | Discharge: 2017-03-09 | Disposition: A | Discharge status: Home / Self Care | Payer: Medicare HMO | Source: Ambulatory Visit | Attending: Internal Medicine | Admitting: Internal Medicine

## 2017-03-09 ENCOUNTER — Telehealth: Payer: Self-pay | Admitting: Internal Medicine

## 2017-03-09 ENCOUNTER — Other Ambulatory Visit (INDEPENDENT_AMBULATORY_CARE_PROVIDER_SITE_OTHER): Payer: Medicare HMO

## 2017-03-09 ENCOUNTER — Encounter: Payer: Self-pay | Admitting: Internal Medicine

## 2017-03-09 VITALS — BP 134/80 | HR 120 | Ht 67.0 in | Wt 177.0 lb

## 2017-03-09 DIAGNOSIS — J9611 Chronic respiratory failure with hypoxia: Secondary | ICD-10-CM

## 2017-03-09 DIAGNOSIS — J441 Chronic obstructive pulmonary disease with (acute) exacerbation: Secondary | ICD-10-CM

## 2017-03-09 DIAGNOSIS — R0789 Other chest pain: Secondary | ICD-10-CM | POA: Diagnosis not present

## 2017-03-09 DIAGNOSIS — J42 Unspecified chronic bronchitis: Secondary | ICD-10-CM | POA: Insufficient documentation

## 2017-03-09 LAB — BASIC METABOLIC PANEL
BUN: 26 mg/dL — AB (ref 6–23)
CALCIUM: 9.8 mg/dL (ref 8.4–10.5)
CO2: 38 meq/L — AB (ref 19–32)
Chloride: 93 mEq/L — ABNORMAL LOW (ref 96–112)
Creatinine, Ser: 1.15 mg/dL (ref 0.40–1.20)
GFR: 61.23 mL/min (ref >60.00)
Glucose, Bld: 95 mg/dL (ref 70–99)
POTASSIUM: 4.2 meq/L (ref 3.5–5.1)
Sodium: 137 mEq/L (ref 135–145)

## 2017-03-09 LAB — CBC WITH DIFFERENTIAL/PLATELET
BASOS PCT: 0.6 % (ref 0.0–3.0)
Basophils Absolute: 0.1 10*3/uL (ref 0.0–0.1)
EOS PCT: 0.9 % (ref 0.0–5.0)
Eosinophils Absolute: 0.1 10*3/uL (ref 0.0–0.7)
HEMATOCRIT: 40.5 % (ref 36.0–46.0)
HEMOGLOBIN: 12.9 g/dL (ref 12.0–15.0)
LYMPHS PCT: 14.7 % (ref 12.0–46.0)
Lymphs Abs: 2.3 10*3/uL (ref 0.7–4.0)
MCHC: 31.8 g/dL (ref 30.0–36.0)
MCV: 89.7 fl (ref 78.0–100.0)
Monocytes Absolute: 1.4 10*3/uL — ABNORMAL HIGH (ref 0.1–1.0)
Monocytes Relative: 9.3 % (ref 3.0–12.0)
Neutro Abs: 11.5 10*3/uL — ABNORMAL HIGH (ref 1.4–7.7)
Neutrophils Relative %: 74.5 % (ref 43.0–77.0)
Platelets: 367 10*3/uL (ref 150.0–400.0)
RBC: 4.51 Mil/uL (ref 3.87–5.11)
RDW: 14.2 % (ref 11.5–15.5)
WBC: 15.4 10*3/uL — AB (ref 4.0–10.5)

## 2017-03-09 MED ORDER — PREDNISONE 10 MG PO TABS: ORAL_TABLET | ORAL | 0 refills | Status: DC

## 2017-03-09 MED ORDER — METHYLPREDNISOLONE ACETATE 80 MG/ML IJ SUSP
80.0000 mg | Freq: Once | INTRAMUSCULAR | Status: AC
  Administered 2017-03-09: 80 mg via INTRAMUSCULAR

## 2017-03-09 MED ORDER — LEVOFLOXACIN 500 MG PO TABS: 500.0000 mg | ORAL_TABLET | Freq: Every day | ORAL | 0 refills | Status: DC

## 2017-03-09 MED ORDER — LEVALBUTEROL HCL 0.63 MG/3ML IN NEBU
0.6300 mg | INHALATION_SOLUTION | Freq: Once | RESPIRATORY_TRACT | Status: AC
  Administered 2017-03-09: 0.63 mg via RESPIRATORY_TRACT

## 2017-03-09 NOTE — Progress Notes (Signed)
Subjective:     Patient ID: Katie Greene, female   DOB: [birth year], 61 y.o.   MRN: [id number]  HPI   OV 02/18/2016  Chief Complaint  Patient presents with  . Follow-up    pt c/o wheezing, runny nose worse qhs- tries to stay indoors d/t heat and humidity worsening her SOB.  Pt requesting an order for a wheelchair.     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 The summer heat is bothering her and she is trying to stay indoors. In accounting for the heat she does not seem to be in an exacerbation. Her sister passed away and she was planning to move to [place] to be with her daughter but these plans of change. She inherited some large piece of land. She's disposing that right now and with that she'll be able to afford living and [place]. Early August 2017 she is going to trip to [place] with her daughter for a family reunion and medication. She wants a wheelchair.  OV 05/26/2016  Chief Complaint  Patient presents with  . Follow-up    Pt states her SOB has slightly worsened since last OV in 02/2016. Pt states her cough is at baseline - with little mucus production with yellow to green mucus. Pt states her appetite has decreased. Pt denies CP/tightness and f/c/s.    [place] 3- 4 3. Chronic hypoxemic respilure.Presents for routine follow-up. She visited [place], [place] for a family reunion in August 2017. This went well without any exacerbation. Overall she is doing well. Shortness of breath and cough is at baseline. There no new issues. She now has a wheelchair. She uses 3 L of oxygen at baseline but sometimes s2    OV 09/02/2016  Chief Complaint  Patient presents with  . Follow-up    Pt states overall her breathing is unchanged. Pt c/o hoarseness. Pt deneis significant cough and CP/tightness.    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 Presents for routine follow-up. At this point in time COPD stable. She is  compliant with the Spiriva and Symbicort. She is 3 L of oxygen at baseline. She's undergoing grief counseling because of her sister's death. There are no new issues she is up-to-date with flu shot. She wants me to fill out some financial assistance form for a Symbicort    OV 12/31/2016  Chief Complaint  Patient presents with  . Follow-up    Pt states her SOB has slightly more SOB. Pt c/o prod cough with little mucus - green in color. Pt denies CP/tightness.    Follow-up Gold stage IV COPD. She is on triple inhaler therapy with Spiriva and Symbicort. She says this is working well for her. There are no new issues. She feels COPD is best ever. She recently in April 2018 separated from her husband and she feels emotionally better as a result. CAT score is 27 and is baseline. USes o2 3L [place]   OV 03/09/2017  Chief Complaint  Patient presents with  . Acute Visit    Pt was given abx and pred but states she only slightly feels improved. Pt c/o SOB and prod cough with yellow mucus and chest tightness. Pt denies f/c/s.    Gold stage IV COPD patient. She is recently separated from her husband is having insurance difficulties. We did help her with sample of triple inhaler therapy combo trelegy/ . She continues with 4 L nasal oxygen at baseline. Mid July 2008  and she went to [place], [place] for a family get together and was exposed to sick people. She called was end of July 2018 and we treated her with 5 days of cephalexin and prednisone for COPD flareup but she's not any better. She still having significant cough and wheezing and white phlegm with occasional yellow tint. She is worried she has pneumonia. Her friend recently buried her son with pneumonia. There is no fever or chills or edema or hemoptysis. In the office she is wheezing which is a little bit unusual for her.   CAT COPD Symptom & Quality of Life Score (GSK trademark) 0 is no burden. 5 is highest burden 12/31/2016  03/09/2017   Never Cough ->  Cough all the time 3   No phlegm in chest -> Chest is full of phlegm 3   No chest tightness -> Chest feels very tight 3   No dyspnea for 1 flight stairs/hill -> Very dyspneic for 1 flight of stairs 5   No limitations for ADL at home -> Very limited with ADL at home 5   Confident leaving home -> Not at all confident leaving home 2   Sleep soundly -> Do not sleep soundly because of lung condition 2   Lots of Energy -> No energy at all 4   TOTAL Score (max 40)  27       Review of Systems     Objective:   Physical Exam  Constitutional: She is oriented to person, place, and time. She appears well-developed and well-nourished. No distress.  HENT:  Head: Normocephalic and atraumatic.  Right Ear: External ear normal.  Left Ear: External ear normal.  Mouth/Throat: Oropharynx is clear and moist. No oropharyngeal exudate.  [place] on  Eyes: Pupils are equal, round, and reactive to light. Conjunctivae and EOM are normal. Right eye exhibits no discharge. Left eye exhibits no discharge. No scleral icterus.  Neck: Normal range of motion. Neck supple. No JVD present. No tracheal deviation present. No thyromegaly present.  Cardiovascular: Normal rate, regular rhythm, normal heart sounds and intact distal pulses.  Exam reveals no gallop and no friction rub.   No murmur heard. Pulmonary/Chest: Effort normal and breath sounds normal. No respiratory distress. She has no wheezes. She has no rales. She exhibits no tenderness.  Mild cough intermittently with purse lip breathing O2 on +  Abdominal: Soft. Bowel sounds are normal. She exhibits no distension and no mass. There is no tenderness. There is no rebound and no guarding.  obese  Musculoskeletal: Normal range of motion. She exhibits no edema or tenderness.  Lymphadenopathy:    She has no cervical adenopathy.  Neurological: She is alert and oriented to person, place, and time. She has normal reflexes. No cranial nerve deficit. She exhibits normal muscle  tone. Coordination normal.  Skin: Skin is warm and dry. No rash noted. She is not diaphoretic. No erythema. No pallor.  Psychiatric: She has a normal mood and affect. Her behavior is normal. Judgment and thought content normal.  Vitals reviewed.  Vitals:   03/09/17 0940  BP: 134/80  Pulse: (!) 120  SpO2: 94%  Weight: 177 lb (80.3 kg)  Height: 5\' 7"  (1.702 m)   Estimated body mass index is 27.72 kg/m as calculated from the following:   Height as of this encounter: 5\' 7"  (1.702 m).   Weight as of this encounter: 177 lb (80.3 kg).      Assessment:       ICD-10-CM  1. COPD exacerbation ([place]) J44.1   2. Chronic respiratory failure with hypoxia (HCC) J96.11        Plan:      - IM Depo-Medrol 80 mg 1 in the office - Xopenex or albuterol nebulizer 1 in the office - Chest x-ray 2 view - Complete blood count and bmet 03/09/2017  - Please take Take prednisone 40mg  once daily x 3 days, then 30mg  once daily x 3 days, then 20mg  once daily x 3 days, then prednisone 10mg  once daily  x 3 days and stop   take levaquin 500mg  once daily  X 5 days  - continue trelegy daily  - Continue oxygen 4 L daily - Continue albuterol as needed  If symptoms get worse please go to emergency room  Follow-up - 3 months or sooner if needed [cancel interim appointments]    Dr. Brand Males, M.D., [facility].C.P Pulmonary and Critical Care Medicine Staff Physician [place] [facility] Pager: [phone], If no answer or between  15:00h - 7:00h: call [phone]  03/09/2017 10:15 AM

## 2017-03-09 NOTE — Telephone Encounter (Signed)
  cxr - no pnuemonia. She will need low dose lung cancer CT in oct 2018  Also bicarb high - Check ABG     Labs   PULMONARY No results for input(s): PHART, PCO2ART, PO2ART, HCO3, TCO2, O2SAT in the last 168 hours.  Invalid input(s): PCO2, PO2  CBC  Recent Labs Lab 03/09/17 1034  HGB 12.9  HCT 40.5  WBC 15.4*  PLT 367.0    COAGULATION No results for input(s): INR in the last 168 hours.  CARDIAC  No results for input(s): TROPONINI in the last 168 hours. No results for input(s): PROBNP in the last 168 hours.   CHEMISTRY  Recent Labs Lab 03/09/17 1034  NA 137  K 4.2  CL 93*  CO2 38*  GLUCOSE 95  BUN 26*  CREATININE 1.15  CALCIUM 9.8   Estimated Creatinine Clearance: 54.6 mL/min (by C-G formula based on SCr of 1.15 mg/dL).   LIVER No results for input(s): AST, ALT, ALKPHOS, BILITOT, PROT, ALBUMIN, INR in the last 168 hours.   INFECTIOUS No results for input(s): LATICACIDVEN, PROCALCITON in the last 168 hours.   ENDOCRINE CBG (last 3)  No results for input(s): GLUCAP in the last 72 hours.       IMAGING x48h  - image(s) personally visualized  -   highlighted in bold Dg Chest 2 View  Result Date: 03/09/2017 CLINICAL DATA:  Chest tightness, wheezing for 7 days, former smoking history EXAM: CHEST  2 VIEW COMPARISON:  CT chest of 05/22/2016 and chest x-ray of 11/22/2013 FINDINGS: No active infiltrate or effusion is seen. Minimal linear atelectasis or scarring is noted at the bases. On the lateral view there are slightly more prominent markings anteriorly, but no abnormality is seen with certainty on the frontal view. This vague opacity may be artifactual, but attention to this area is recommended on followup chest x-ray. Mediastinal and hilar contours are unremarkable. The heart is within upper limits of normal. No bony abnormality is seen. IMPRESSION: 1. No definite active process. Minimal linear atelectasis or scarring at the bases. 2. Vague opacity  anteriorly on the lateral view not seen on the frontal projection, possibly artifactual. Consider followup chest x-ray to assess stability. Electronically Signed   By: Ivar Drape M.D.   On: 03/09/2017 13:28

## 2017-03-09 NOTE — Telephone Encounter (Signed)
I already saw her today. Please sharre results with her  Dr. Brand Males, M.D., [facility].C.P Pulmonary and Critical Care Medicine Staff Physician [place] [facility] Pager: [phone], If no answer or between  15:00h - 7:00h: call [phone]  03/09/2017 2:45 PM

## 2017-03-09 NOTE — Patient Instructions (Signed)
COPD exacerbation (HCC)  Chronic respiratory failure with hypoxia (HCC)   - IM Depo-Medrol 80 mg 1 in the office - Xopenex or albuterol nebulizer 1 in the office - Chest x-ray 2 view - Complete blood count and bmet 03/09/2017  - Please take Take prednisone 40mg  once daily x 3 days, then 30mg  once daily x 3 days, then 20mg  once daily x 3 days, then prednisone 10mg  once daily  x 3 days and stop   take levaquin 500mg  once daily  X 5 days  - continue trelegy daily  - Continue oxygen 4 L daily - Continue albuterol as needed  If symptoms get worse please go to emergency room  Follow-up - 3 months or sooner if needed [cancel interim appointments]

## 2017-03-10 ENCOUNTER — Encounter: Payer: Self-pay | Admitting: Internal Medicine

## 2017-03-10 DIAGNOSIS — Z1231 Encounter for screening mammogram for malignant neoplasm of breast: Secondary | ICD-10-CM | POA: Diagnosis not present

## 2017-03-11 ENCOUNTER — Encounter (HOSPITAL_COMMUNITY): Payer: Self-pay

## 2017-03-11 NOTE — Telephone Encounter (Signed)
Pt is requesting lab and xray results from 03/09/17  MR please advise. Thanks.

## 2017-03-15 NOTE — Telephone Encounter (Signed)
Will forward to MR as FYI 

## 2017-03-15 NOTE — Telephone Encounter (Signed)
PM please advise as MR is not available. Thanks.

## 2017-03-16 ENCOUNTER — Encounter (HOSPITAL_COMMUNITY): Payer: Self-pay

## 2017-03-16 NOTE — Telephone Encounter (Signed)
Made pt an appt for 03/18/17 at 8:30 for ABG's pt aware of appt.

## 2017-03-16 NOTE — Telephone Encounter (Signed)
Called pt letting her know MR wanted her to have an ABG done due to bicarb being elevated. Pt expressed understanding about that.  Order placed.  MR, please advise what pt's bicarb level is. Unable to locate this in pt's chart and she is wanting to know exact result.   [place], please schedule ABG for pt.

## 2017-03-18 ENCOUNTER — Encounter (HOSPITAL_COMMUNITY): Admission: RE | Admit: 2017-03-18 | Payer: Self-pay | Source: Ambulatory Visit

## 2017-03-18 ENCOUNTER — Ambulatory Visit (HOSPITAL_COMMUNITY)
Admission: RE | Admit: 2017-03-18 | Discharge: 2017-03-18 | Disposition: A | Discharge status: Home / Self Care | Payer: Medicare HMO | Source: Ambulatory Visit | Attending: Internal Medicine | Admitting: Internal Medicine

## 2017-03-18 DIAGNOSIS — J9611 Chronic respiratory failure with hypoxia: Secondary | ICD-10-CM | POA: Diagnosis not present

## 2017-03-18 LAB — BLOOD GAS, ARTERIAL
ACID-BASE EXCESS: 3.9 mmol/L — AB (ref 0.0–2.0)
Bicarbonate: 28.4 mmol/L — ABNORMAL HIGH (ref 20.0–28.0)
Drawn by: 205171
O2 CONTENT: 4 L/min
O2 SAT: 95 %
PCO2 ART: 46.9 mmHg (ref 32.0–48.0)
PO2 ART: 78.6 mmHg — AB (ref 83.0–108.0)
Patient temperature: 98.6
pH, Arterial: 7.399 (ref 7.350–7.450)

## 2017-03-22 ENCOUNTER — Encounter: Payer: Self-pay | Admitting: Internal Medicine

## 2017-03-22 NOTE — Telephone Encounter (Signed)
MR please advise. Thanks! 

## 2017-03-23 ENCOUNTER — Encounter (HOSPITAL_COMMUNITY)
Admission: RE | Admit: 2017-03-23 | Discharge: 2017-03-23 | Disposition: A | Discharge status: Home / Self Care | Payer: Self-pay | Source: Ambulatory Visit | Attending: Internal Medicine | Admitting: Internal Medicine

## 2017-03-23 ENCOUNTER — Other Ambulatory Visit: Payer: Self-pay | Admitting: Emergency Medicine

## 2017-03-23 ENCOUNTER — Telehealth: Payer: Self-pay | Admitting: Internal Medicine

## 2017-03-23 DIAGNOSIS — J449 Chronic obstructive pulmonary disease, unspecified: Secondary | ICD-10-CM

## 2017-03-23 MED ORDER — FLUTICASONE PROPIONATE 50 MCG/ACT NA SUSP: 2.0000 | Freq: Every day | NASAL | 5 refills | Status: DC

## 2017-03-23 MED ORDER — ALBUTEROL SULFATE (2.5 MG/3ML) 0.083% IN NEBU: 2.5000 mg | INHALATION_SOLUTION | RESPIRATORY_TRACT | 6 refills | Status: DC | PRN

## 2017-03-23 NOTE — Telephone Encounter (Signed)
Pt states she was at pulm rehab today, and it was suggested by pulm rehab that she have a home nebulizer.  Pt is requesting MR's recs on this.  Pt also requesting ABG results from last Thursday.  MR please advise.  Thanks.

## 2017-03-23 NOTE — Telephone Encounter (Signed)
Pt is aware of results and voiced her understanding. Order for neb machine and albuterol has been sent to [facility] per pt request. Rx has been placed to MR's cubby for signature.   Will route to [place] to f/u on.

## 2017-03-23 NOTE — Telephone Encounter (Signed)
Results for TARSHIA, KOT (MRN [id number]) as of 03/23/2017 16:42  Ref. Range 03/18/2017 08:07  O2 Content Latest Units: L/min 4.0  pH, Arterial Latest Ref Range: 7.350 - 7.450  7.399  pCO2 arterial Latest Ref Range: 32.0 - 48.0 mmHg 46.9  pO2, Arterial Latest Ref Range: 83.0 - 108.0 mmHg 78.6 (L)    ABG  - co2 levels < 50 at rest - so does NOT qualify for bipap at night - o2 level 78 on 4L [place] - she can try 3L [place] at rest and keep it at [facility] as long as pulse ox >88%  And OK for neb alb at home prn q4h  Dr. Brand Males, M.D., [facility].C.P Pulmonary and Critical Care Medicine Staff Physician [place] [facility] Pager: [phone], If no answer or between  15:00h - 7:00h: call [phone]  03/23/2017 4:44 PM

## 2017-03-24 DIAGNOSIS — J449 Chronic obstructive pulmonary disease, unspecified: Secondary | ICD-10-CM | POA: Diagnosis not present

## 2017-03-24 DIAGNOSIS — J9611 Chronic respiratory failure with hypoxia: Secondary | ICD-10-CM | POA: Diagnosis not present

## 2017-03-24 MED ORDER — ALBUTEROL SULFATE (2.5 MG/3ML) 0.083% IN NEBU: 2.5000 mg | INHALATION_SOLUTION | RESPIRATORY_TRACT | 6 refills | Status: DC | PRN

## 2017-03-24 NOTE — Telephone Encounter (Signed)
E-mail from patient asking that her albuterol neb soln not be sent to [facility] but instead be sent to [facility].  However, because the Rx was generated in a 'future encounter' Epic will not allow me to send the Rx in this 8.20.18 encounter.  Will send Rx in the 8.21.18 phone note.  MR please advise on the ABG results.  Thank you.

## 2017-03-24 NOTE — Telephone Encounter (Addendum)
Per the 8.20.18 e-mail chain w/ patient, she is now requesting her albuterol neb soln to be sent to [facility].  Per Epic, unable to send Rx in that encounter because the medication was last changed in a 'future encounter' (this being dated 8.21.18)  Rx resent to [place] per pt's request in the above encounter  Will let [name] know so that the physical Rx can be discarded

## 2017-03-24 NOTE — Telephone Encounter (Signed)
abg results shared on phone note yesterday

## 2017-03-25 ENCOUNTER — Encounter (HOSPITAL_COMMUNITY)
Admission: RE | Admit: 2017-03-25 | Discharge: 2017-03-25 | Disposition: A | Discharge status: Home / Self Care | Payer: Self-pay | Source: Ambulatory Visit | Attending: Internal Medicine | Admitting: Internal Medicine

## 2017-03-30 ENCOUNTER — Encounter (HOSPITAL_COMMUNITY)
Admission: RE | Admit: 2017-03-30 | Discharge: 2017-03-30 | Disposition: A | Discharge status: Home / Self Care | Payer: Self-pay | Source: Ambulatory Visit | Attending: Internal Medicine | Admitting: Internal Medicine

## 2017-04-01 ENCOUNTER — Encounter (HOSPITAL_COMMUNITY)
Admission: RE | Admit: 2017-04-01 | Discharge: 2017-04-01 | Disposition: A | Discharge status: Home / Self Care | Payer: Self-pay | Source: Ambulatory Visit | Attending: Internal Medicine | Admitting: Internal Medicine

## 2017-04-01 ENCOUNTER — Encounter: Payer: Self-pay | Admitting: Internal Medicine

## 2017-04-02 ENCOUNTER — Other Ambulatory Visit: Payer: Self-pay

## 2017-04-02 MED ORDER — ALBUTEROL SULFATE HFA 108 (90 BASE) MCG/ACT IN AERS: 2.0000 | INHALATION_SPRAY | Freq: Four times a day (QID) | RESPIRATORY_TRACT | 2 refills | Status: DC | PRN

## 2017-04-06 ENCOUNTER — Encounter (HOSPITAL_COMMUNITY): Payer: Self-pay

## 2017-04-06 DIAGNOSIS — J42 Unspecified chronic bronchitis: Secondary | ICD-10-CM | POA: Insufficient documentation

## 2017-04-06 DIAGNOSIS — J449 Chronic obstructive pulmonary disease, unspecified: Secondary | ICD-10-CM | POA: Diagnosis not present

## 2017-04-08 ENCOUNTER — Encounter (HOSPITAL_COMMUNITY)
Admission: RE | Admit: 2017-04-08 | Discharge: 2017-04-08 | Disposition: A | Discharge status: Home / Self Care | Payer: Self-pay | Source: Ambulatory Visit | Attending: Internal Medicine | Admitting: Internal Medicine

## 2017-04-13 ENCOUNTER — Encounter (HOSPITAL_COMMUNITY)
Admission: RE | Admit: 2017-04-13 | Discharge: 2017-04-13 | Disposition: A | Discharge status: Home / Self Care | Payer: Self-pay | Source: Ambulatory Visit | Attending: Internal Medicine | Admitting: Internal Medicine

## 2017-04-15 ENCOUNTER — Encounter (HOSPITAL_COMMUNITY)
Admission: RE | Admit: 2017-04-15 | Discharge: 2017-04-15 | Disposition: A | Discharge status: Home / Self Care | Payer: Self-pay | Source: Ambulatory Visit | Attending: Internal Medicine | Admitting: Internal Medicine

## 2017-04-20 ENCOUNTER — Encounter (HOSPITAL_COMMUNITY)
Admission: RE | Admit: 2017-04-20 | Discharge: 2017-04-20 | Disposition: A | Discharge status: Home / Self Care | Payer: Self-pay | Source: Ambulatory Visit | Attending: Internal Medicine | Admitting: Internal Medicine

## 2017-04-20 ENCOUNTER — Telehealth: Payer: Self-pay | Admitting: Internal Medicine

## 2017-04-20 ENCOUNTER — Encounter: Payer: Self-pay | Admitting: Internal Medicine

## 2017-04-20 MED ORDER — FLUTICASONE-UMECLIDIN-VILANT 100-62.5-25 MCG/INH IN AEPB: 1.0000 | INHALATION_SPRAY | Freq: Every day | RESPIRATORY_TRACT | 3 refills | Status: DC

## 2017-04-20 NOTE — Telephone Encounter (Signed)
Rx has been printed and is in MR's look-ats, will update chart once this has been received back from MR.

## 2017-04-20 NOTE — Telephone Encounter (Signed)
I received paperwork and placed in MR look at folder.

## 2017-04-22 ENCOUNTER — Encounter (HOSPITAL_COMMUNITY)
Admission: RE | Admit: 2017-04-22 | Discharge: 2017-04-22 | Disposition: A | Discharge status: Home / Self Care | Payer: Self-pay | Source: Ambulatory Visit | Attending: Internal Medicine | Admitting: Internal Medicine

## 2017-04-26 NOTE — Telephone Encounter (Signed)
Pt sent an email checking the status of her [place] application.  Please advise.  Thanks!

## 2017-04-27 ENCOUNTER — Encounter (HOSPITAL_COMMUNITY)
Admission: RE | Admit: 2017-04-27 | Discharge: 2017-04-27 | Disposition: A | Discharge status: Home / Self Care | Payer: Self-pay | Source: Ambulatory Visit | Attending: Internal Medicine | Admitting: Internal Medicine

## 2017-04-27 NOTE — Telephone Encounter (Signed)
These forms were placed in MR's look-ats. MR please advise if you have completed these forms. Thanks.

## 2017-04-28 NOTE — Telephone Encounter (Signed)
Will be giving to [facility] or [facility] by end of week   Dr. Brand Males, M.D., [facility].C.P Pulmonary and Critical Care Medicine Staff Physician [place] [facility] Pager: [phone], If no answer or between  15:00h - 7:00h: call [phone]  04/28/2017 6:22 PM

## 2017-04-29 ENCOUNTER — Encounter (HOSPITAL_COMMUNITY)
Admission: RE | Admit: 2017-04-29 | Discharge: 2017-04-29 | Disposition: A | Discharge status: Home / Self Care | Payer: Medicare HMO | Source: Ambulatory Visit | Attending: Internal Medicine | Admitting: Internal Medicine

## 2017-04-29 NOTE — Telephone Encounter (Signed)
Will keep this message open until I receive the forms back from MR.

## 2017-04-29 NOTE — Telephone Encounter (Signed)
Routing to [place] to look out for forms from MR>

## 2017-04-30 NOTE — Telephone Encounter (Signed)
I received the forms back from MR and will take care of faxing them for pt. Nothing further needed at this time.

## 2017-05-01 DIAGNOSIS — Z23 Encounter for immunization: Secondary | ICD-10-CM | POA: Diagnosis not present

## 2017-05-04 ENCOUNTER — Encounter (HOSPITAL_COMMUNITY)
Admission: RE | Admit: 2017-05-04 | Discharge: 2017-05-04 | Disposition: A | Discharge status: Home / Self Care | Payer: Self-pay | Source: Ambulatory Visit | Attending: Internal Medicine | Admitting: Internal Medicine

## 2017-05-04 DIAGNOSIS — J42 Unspecified chronic bronchitis: Secondary | ICD-10-CM | POA: Insufficient documentation

## 2017-05-06 ENCOUNTER — Encounter (HOSPITAL_COMMUNITY)
Admission: RE | Admit: 2017-05-06 | Discharge: 2017-05-06 | Disposition: A | Discharge status: Home / Self Care | Payer: Self-pay | Source: Ambulatory Visit | Attending: Internal Medicine | Admitting: Internal Medicine

## 2017-05-06 DIAGNOSIS — J449 Chronic obstructive pulmonary disease, unspecified: Secondary | ICD-10-CM | POA: Diagnosis not present

## 2017-05-06 NOTE — Telephone Encounter (Signed)
I have given this last week to Emily/Elise  Dr. Brand Males, M.D., [facility].C.P Pulmonary and Critical Care Medicine Staff Physician [place] [facility] Pager: [phone], If no answer or between  15:00h - 7:00h: call [phone]  05/06/2017 2:49 PM

## 2017-05-08 ENCOUNTER — Encounter: Payer: Self-pay | Admitting: Internal Medicine

## 2017-05-10 ENCOUNTER — Other Ambulatory Visit: Payer: Self-pay

## 2017-05-10 MED ORDER — FLUTICASONE-UMECLIDIN-VILANT 100-62.5-25 MCG/INH IN AEPB: 1.0000 | INHALATION_SPRAY | Freq: Every day | RESPIRATORY_TRACT | 0 refills | Status: DC

## 2017-05-10 NOTE — Telephone Encounter (Deleted)
Rx printed by accident Will resend electronically

## 2017-05-11 ENCOUNTER — Encounter (HOSPITAL_COMMUNITY)
Admission: RE | Admit: 2017-05-11 | Discharge: 2017-05-11 | Disposition: A | Discharge status: Home / Self Care | Payer: Self-pay | Source: Ambulatory Visit | Attending: Internal Medicine | Admitting: Internal Medicine

## 2017-05-12 MED ORDER — FLUTICASONE-UMECLIDIN-VILANT 100-62.5-25 MCG/INH IN AEPB: 1.0000 | INHALATION_SPRAY | Freq: Every day | RESPIRATORY_TRACT | 3 refills | Status: DC

## 2017-05-12 NOTE — Addendum Note (Signed)
Addended by: Desmond Dike C on: 05/12/2017 12:52 PM   Modules accepted: Orders

## 2017-05-13 ENCOUNTER — Encounter (HOSPITAL_COMMUNITY): Payer: Self-pay

## 2017-05-17 DIAGNOSIS — N390 Urinary tract infection, site not specified: Secondary | ICD-10-CM | POA: Diagnosis not present

## 2017-05-17 DIAGNOSIS — Z0000 Encounter for general adult medical examination without abnormal findings: Secondary | ICD-10-CM | POA: Diagnosis not present

## 2017-05-17 DIAGNOSIS — E7849 Other hyperlipidemia: Secondary | ICD-10-CM | POA: Diagnosis not present

## 2017-05-17 DIAGNOSIS — I10 Essential (primary) hypertension: Secondary | ICD-10-CM | POA: Diagnosis not present

## 2017-05-18 ENCOUNTER — Encounter (HOSPITAL_COMMUNITY)
Admission: RE | Admit: 2017-05-18 | Discharge: 2017-05-18 | Disposition: A | Discharge status: Home / Self Care | Payer: Self-pay | Source: Ambulatory Visit | Attending: Internal Medicine | Admitting: Internal Medicine

## 2017-05-20 ENCOUNTER — Encounter (HOSPITAL_COMMUNITY)
Admission: RE | Admit: 2017-05-20 | Discharge: 2017-05-20 | Disposition: A | Discharge status: Home / Self Care | Payer: Self-pay | Source: Ambulatory Visit | Attending: Internal Medicine | Admitting: Internal Medicine

## 2017-05-24 DIAGNOSIS — J449 Chronic obstructive pulmonary disease, unspecified: Secondary | ICD-10-CM | POA: Diagnosis not present

## 2017-05-24 DIAGNOSIS — E7849 Other hyperlipidemia: Secondary | ICD-10-CM | POA: Diagnosis not present

## 2017-05-24 DIAGNOSIS — I2781 Cor pulmonale (chronic): Secondary | ICD-10-CM | POA: Diagnosis not present

## 2017-05-24 DIAGNOSIS — Z9981 Dependence on supplemental oxygen: Secondary | ICD-10-CM | POA: Diagnosis not present

## 2017-05-24 DIAGNOSIS — M545 Low back pain: Secondary | ICD-10-CM | POA: Diagnosis not present

## 2017-05-24 DIAGNOSIS — H6121 Impacted cerumen, right ear: Secondary | ICD-10-CM | POA: Diagnosis not present

## 2017-05-24 DIAGNOSIS — F3289 Other specified depressive episodes: Secondary | ICD-10-CM | POA: Diagnosis not present

## 2017-05-24 DIAGNOSIS — K5909 Other constipation: Secondary | ICD-10-CM | POA: Diagnosis not present

## 2017-05-24 DIAGNOSIS — Z0000 Encounter for general adult medical examination without abnormal findings: Secondary | ICD-10-CM | POA: Diagnosis not present

## 2017-05-25 ENCOUNTER — Encounter (HOSPITAL_COMMUNITY)
Admission: RE | Admit: 2017-05-25 | Discharge: 2017-05-25 | Disposition: A | Discharge status: Home / Self Care | Payer: Self-pay | Source: Ambulatory Visit | Attending: Internal Medicine | Admitting: Internal Medicine

## 2017-05-25 ENCOUNTER — Ambulatory Visit (INDEPENDENT_AMBULATORY_CARE_PROVIDER_SITE_OTHER)
Admission: RE | Admit: 2017-05-25 | Discharge: 2017-05-25 | Disposition: A | Discharge status: Home / Self Care | Payer: Medicare HMO | Source: Ambulatory Visit | Attending: Acute Care | Admitting: Acute Care

## 2017-05-25 DIAGNOSIS — Z87891 Personal history of nicotine dependence: Secondary | ICD-10-CM

## 2017-05-27 ENCOUNTER — Telehealth: Payer: Self-pay | Admitting: Internal Medicine

## 2017-05-27 ENCOUNTER — Encounter (HOSPITAL_COMMUNITY)
Admission: RE | Admit: 2017-05-27 | Discharge: 2017-05-27 | Disposition: A | Discharge status: Home / Self Care | Payer: Self-pay | Source: Ambulatory Visit | Attending: Internal Medicine | Admitting: Internal Medicine

## 2017-05-27 DIAGNOSIS — Z122 Encounter for screening for malignant neoplasm of respiratory organs: Secondary | ICD-10-CM

## 2017-05-27 DIAGNOSIS — Z87891 Personal history of nicotine dependence: Secondary | ICD-10-CM

## 2017-05-27 NOTE — Telephone Encounter (Signed)
Called and spoke with pt and she is aware of results of the ct scan.  She stated that she will contact her PCP to review the scan to see if anything needs to be done further.  Will forward back to DP to follow up on next LDCT scan.

## 2017-05-27 NOTE — Telephone Encounter (Signed)
Results faxed to PCP.  Order placed for 1 yr f/u LDCT.

## 2017-05-31 ENCOUNTER — Telehealth: Payer: Self-pay | Admitting: Internal Medicine

## 2017-05-31 DIAGNOSIS — Z1212 Encounter for screening for malignant neoplasm of rectum: Secondary | ICD-10-CM | POA: Diagnosis not present

## 2017-05-31 MED ORDER — ALBUTEROL SULFATE HFA 108 (90 BASE) MCG/ACT IN AERS: 2.0000 | INHALATION_SPRAY | Freq: Four times a day (QID) | RESPIRATORY_TRACT | 3 refills | Status: DC | PRN

## 2017-05-31 NOTE — Telephone Encounter (Signed)
rx printed out and placed with MR look at to signed and will need to be faxed to [place].  Will forward to EP to make her aware.

## 2017-06-01 ENCOUNTER — Encounter (HOSPITAL_COMMUNITY)
Admission: RE | Admit: 2017-06-01 | Discharge: 2017-06-01 | Disposition: A | Discharge status: Home / Self Care | Payer: Self-pay | Source: Ambulatory Visit | Attending: Internal Medicine | Admitting: Internal Medicine

## 2017-06-02 DIAGNOSIS — Z124 Encounter for screening for malignant neoplasm of cervix: Secondary | ICD-10-CM | POA: Diagnosis not present

## 2017-06-02 DIAGNOSIS — Z780 Asymptomatic menopausal state: Secondary | ICD-10-CM | POA: Diagnosis not present

## 2017-06-02 DIAGNOSIS — Z6828 Body mass index (BMI) 28.0-28.9, adult: Secondary | ICD-10-CM | POA: Diagnosis not present

## 2017-06-02 DIAGNOSIS — H6121 Impacted cerumen, right ear: Secondary | ICD-10-CM | POA: Diagnosis not present

## 2017-06-02 DIAGNOSIS — N952 Postmenopausal atrophic vaginitis: Secondary | ICD-10-CM | POA: Diagnosis not present

## 2017-06-02 DIAGNOSIS — Z01419 Encounter for gynecological examination (general) (routine) without abnormal findings: Secondary | ICD-10-CM | POA: Diagnosis not present

## 2017-06-03 ENCOUNTER — Encounter (HOSPITAL_COMMUNITY): Payer: Self-pay

## 2017-06-03 DIAGNOSIS — J42 Unspecified chronic bronchitis: Secondary | ICD-10-CM | POA: Insufficient documentation

## 2017-06-04 ENCOUNTER — Other Ambulatory Visit: Payer: Self-pay | Admitting: *Deleted

## 2017-06-04 MED ORDER — FLUTICASONE-UMECLIDIN-VILANT 100-62.5-25 MCG/INH IN AEPB: 1.0000 | INHALATION_SPRAY | Freq: Every day | RESPIRATORY_TRACT | 3 refills | Status: DC

## 2017-06-06 DIAGNOSIS — J449 Chronic obstructive pulmonary disease, unspecified: Secondary | ICD-10-CM | POA: Diagnosis not present

## 2017-06-08 ENCOUNTER — Encounter (HOSPITAL_COMMUNITY)
Admission: RE | Admit: 2017-06-08 | Discharge: 2017-06-08 | Disposition: A | Discharge status: Home / Self Care | Payer: Self-pay | Source: Ambulatory Visit | Attending: Internal Medicine | Admitting: Internal Medicine

## 2017-06-08 NOTE — Telephone Encounter (Signed)
Raquel Sarna please advise if this prescription has been taken care of. Thanks.

## 2017-06-08 NOTE — Telephone Encounter (Signed)
The Rx is in MR's to be signed folder. Once he signs it, I will take care of faxing it in.

## 2017-06-09 NOTE — Telephone Encounter (Signed)
Faxed Rx for pt. Nothing further needed.

## 2017-06-10 ENCOUNTER — Encounter (HOSPITAL_COMMUNITY)
Admission: RE | Admit: 2017-06-10 | Discharge: 2017-06-10 | Disposition: A | Discharge status: Home / Self Care | Payer: Self-pay | Source: Ambulatory Visit | Attending: Internal Medicine | Admitting: Internal Medicine

## 2017-06-14 DIAGNOSIS — Z6828 Body mass index (BMI) 28.0-28.9, adult: Secondary | ICD-10-CM | POA: Diagnosis not present

## 2017-06-14 DIAGNOSIS — J449 Chronic obstructive pulmonary disease, unspecified: Secondary | ICD-10-CM | POA: Diagnosis not present

## 2017-06-14 DIAGNOSIS — I10 Essential (primary) hypertension: Secondary | ICD-10-CM | POA: Diagnosis not present

## 2017-06-14 DIAGNOSIS — E7849 Other hyperlipidemia: Secondary | ICD-10-CM | POA: Diagnosis not present

## 2017-06-14 DIAGNOSIS — R600 Localized edema: Secondary | ICD-10-CM | POA: Diagnosis not present

## 2017-06-15 ENCOUNTER — Encounter (HOSPITAL_COMMUNITY): Payer: Self-pay

## 2017-06-17 ENCOUNTER — Encounter (HOSPITAL_COMMUNITY): Payer: Self-pay

## 2017-06-22 ENCOUNTER — Encounter (HOSPITAL_COMMUNITY)
Admission: RE | Admit: 2017-06-22 | Discharge: 2017-06-22 | Disposition: A | Discharge status: Home / Self Care | Payer: Self-pay | Source: Ambulatory Visit | Attending: Internal Medicine | Admitting: Internal Medicine

## 2017-06-29 ENCOUNTER — Encounter (HOSPITAL_COMMUNITY): Payer: Self-pay

## 2017-06-29 ENCOUNTER — Encounter: Payer: Self-pay | Admitting: Internal Medicine

## 2017-06-29 ENCOUNTER — Ambulatory Visit: Payer: Medicare HMO | Admitting: Internal Medicine

## 2017-06-29 VITALS — BP 112/64 | HR 126 | Ht 67.0 in | Wt 176.4 lb

## 2017-06-29 DIAGNOSIS — J449 Chronic obstructive pulmonary disease, unspecified: Secondary | ICD-10-CM | POA: Diagnosis not present

## 2017-06-29 DIAGNOSIS — J9611 Chronic respiratory failure with hypoxia: Secondary | ICD-10-CM | POA: Diagnosis not present

## 2017-06-29 MED ORDER — ROFLUMILAST 500 MCG PO TABS: 500.0000 ug | ORAL_TABLET | Freq: Every day | ORAL | 3 refills | Status: DC

## 2017-06-29 MED ORDER — ROFLUMILAST 250 MCG PO TABS: 1.0000 | ORAL_TABLET | Freq: Every day | ORAL | 0 refills | Status: DC

## 2017-06-29 MED ORDER — ROFLUMILAST 500 MCG PO TABS: 500.0000 ug | ORAL_TABLET | Freq: Every day | ORAL | 11 refills | Status: DC

## 2017-06-29 NOTE — Patient Instructions (Signed)
ICD-10-CM   1. Chronic respiratory failure with hypoxia (HCC) J96.11   2. COPD, severe ([place]) J44.9    Glad COPD stable and you are up-to-date with the flu shot  Plan -Start Daliresp to prevent COPD exacerbations -Continue N-acetylcysteine to prevent COPD exacerbations -Continue triple inhaler therapy trilogy scheduled -Albuterol as needed -Continue schedule oxygen as before  Follow-up 3 months or sooner with Dr. Chase Caller

## 2017-06-29 NOTE — Progress Notes (Signed)
Subjective:     Patient ID: Katie Greene, female   DOB: Dec 18, 1955, 61 y.o.   MRN: [id number]  PCP Leanna Battles, MD   HPI    OV 02/18/2016  Chief Complaint  Patient presents with  . Follow-up    pt c/o wheezing, runny nose worse qhs- tries to stay indoors d/t heat and humidity worsening her SOB.  Pt requesting an order for a wheelchair.     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 The summer heat is bothering her and she is trying to stay indoors. In accounting for the heat she does not seem to be in an exacerbation. Her sister passed away and she was planning to move to [place] to be with her daughter but these plans of change. She inherited some large piece of land. She's disposing that right now and with that she'll be able to afford living and [place]. Early August 2017 she is going to trip to [place] with her daughter for a family reunion and medication. She wants a wheelchair.  OV 05/26/2016  Chief Complaint  Patient presents with  . Follow-up    Pt states her SOB has slightly worsened since last OV in 02/2016. Pt states her cough is at baseline - with little mucus production with yellow to green mucus. Pt states her appetite has decreased. Pt denies CP/tightness and f/c/s.    [place] 3- 4 3. Chronic hypoxemic respilure.Presents for routine follow-up. She visited [place], [place] for a family reunion in August 2017. This went well without any exacerbation. Overall she is doing well. Shortness of breath and cough is at baseline. There no new issues. She now has a wheelchair. She uses 3 L of oxygen at baseline but sometimes s2    OV 09/02/2016  Chief Complaint  Patient presents with  . Follow-up    Pt states overall her breathing is unchanged. Pt c/o hoarseness. Pt deneis significant cough and CP/tightness.    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 Presents for routine follow-up. At this point  in time COPD stable. She is compliant with the Spiriva and Symbicort. She is 3 L of oxygen at baseline. She's undergoing grief counseling because of her sister's death. There are no new issues she is up-to-date with flu shot. She wants me to fill out some financial assistance form for a Symbicort    OV 12/31/2016  Chief Complaint  Patient presents with  . Follow-up    Pt states her SOB has slightly more SOB. Pt c/o prod cough with little mucus - green in color. Pt denies CP/tightness.    Follow-up Gold stage IV COPD. She is on triple inhaler therapy with Spiriva and Symbicort. She says this is working well for her. There are no new issues. She feels COPD is best ever. She recently in April 2018 separated from her husband and she feels emotionally better as a result. CAT score is 27 and is baseline. USes o2 3L [place]   OV 03/09/2017  Chief Complaint  Patient presents with  . Acute Visit    Pt was given abx and pred but states she only slightly feels improved. Pt c/o SOB and prod cough with yellow mucus and chest tightness. Pt denies f/c/s.    Gold stage IV COPD patient. She is recently separated from her husband is having insurance difficulties. We did help her with sample of triple inhaler therapy combo trelegy/ . She continues with 4 L  nasal oxygen at baseline. Mid July 2008 and she went to [place], [place] for a family get together and was exposed to sick people. She called was end of July 2018 and we treated her with 5 days of cephalexin and prednisone for COPD flareup but she's not any better. She still having significant cough and wheezing and white phlegm with occasional yellow tint. She is worried she has pneumonia. Her friend recently buried her son with pneumonia. There is no fever or chills or edema or hemoptysis. In the office she is wheezing which is a little bit unusual for her.   OV 06/29/2017  Chief Complaint  Patient presents with  . Follow-up    Pt states that she has been  doing okay. States that she has an occ. cough, no change in the SOB. Denies any CP.     Follow-up advanced COPD Gold stage IV with chronic hypoxemic restaurant failure.  Last visit she was in exacerbation.  After the exacerbation resolved be tested for hypercapnia but her PCO2 is 45.  Therefore she did not qualify for nocturnal BiPAP.  Currently she feels fine.  COPD CAT score is 27 documented below and is at baseline.  She is on triple inhaler therapy by [place].  She was on the study for this and it worked well for her.  She is also on albuterol as needed chronic oxygen and N-acetylcysteine to prevent COPD exacerbations.  She is interested in [place] to prevent COPD exacerbations.  She is up-to-date with her flu shot.  CAT COPD Symptom & Quality of Life Score (GSK trademark) 0 is no burden. 5 is highest burden 12/31/2016  03/09/2017   Never Cough -> Cough all the time 3 2  No phlegm in chest -> Chest is full of phlegm 3 2  No chest tightness -> Chest feels very tight 3 3  No dyspnea for 1 flight stairs/hill -> Very dyspneic for 1 flight of stairs 5 5  No limitations for ADL at home -> Very limited with ADL at home 5 3  Confident leaving home -> Not at all confident leaving home 2 5  Sleep soundly -> Do not sleep soundly because of lung condition 2 4  Lots of Energy -> No energy at all 4 3  TOTAL Score (max 40)  27 27         has a past medical history of Anxiety, Arthritis, Chest pain, COPD (chronic obstructive pulmonary disease) ([place]), Depression, Fatigue, GERD (gastroesophageal reflux disease), Hemorrhoids, Hyperlipidemia, Hypertension, On home oxygen therapy, and Palpitations.   reports that she quit smoking about 5 years ago. Her smoking use included cigarettes. She has a 12.50 pack-year smoking history. she has never used smokeless tobacco.  Past Surgical History:  Procedure Laterality Date  . CHOLECYSTECTOMY     laparosopic attempted, then had to open  . COLONOSCOPY     x2 with  polyps removed  . COLONOSCOPY N/A 08/27/2015   Procedure: COLONOSCOPY;  Surgeon: Manus Gunning, MD;  Location: [facility];  Service: Gastroenterology;  Laterality: N/A;  . oopherectomy     1 ovary removed only  . POLYPECTOMY    . SALPINGECTOMY     1 fallopian tube removed    No Known Allergies  Immunization History  Administered Date(s) Administered  . Influenza Split 06/10/2012, 04/03/2014, 04/20/2015  . Influenza,inj,Quad PF,6+ Mos 04/24/2013, 03/03/2016  . Influenza-Unspecified 05/29/2017  . Pneumococcal Conjugate-13 05/07/2015  . Pneumococcal Polysaccharide-23 06/10/2012  . Zoster 05/10/2014    Family  History  Problem Relation Age of Onset  . Heart attack Mother   . Pancreatic cancer Mother   . Prostate cancer Father   . Hypertension Father   . Lung cancer Father   . Throat cancer Brother        Throat Cancer  . Colon cancer Sister      Current Outpatient Medications:  .  Acetylcysteine 600 MG CAPS, Take 1 capsule by mouth 2 (two) times daily., Disp: , Rfl:  .  albuterol (PROVENTIL) (2.5 MG/3ML) 0.083% nebulizer solution, Take 3 mLs (2.5 mg total) by nebulization every 4 (four) hours as needed for wheezing or shortness of breath. Dx code j44.9, Disp: 125 mL, Rfl: 6 .  albuterol (VENTOLIN HFA) 108 (90 Base) MCG/ACT inhaler, Inhale 2 puffs into the lungs every 6 (six) hours as needed for wheezing or shortness of breath., Disp: 3 Inhaler, Rfl: 3 .  ALPRAZolam (XANAX) 0.5 MG tablet, Take 0.5 mg by mouth 3 (three) times daily as needed for anxiety. , Disp: , Rfl:  .  aspirin EC 81 MG tablet, Take 81 mg by mouth every morning. , Disp: , Rfl:  .  buPROPion (WELLBUTRIN SR) 100 MG 12 hr tablet, Take 100 mg by mouth every morning. , Disp: , Rfl:  .  cetirizine (ZYRTEC) 10 MG tablet, Take 10 mg by mouth at bedtime., Disp: , Rfl:  .  cholecalciferol (VITAMIN D) 1000 UNITS tablet, Take 1,000 Units by mouth daily., Disp: , Rfl:  .  diltiazem (CARDIZEM CD) 180 MG 24 hr  capsule, Take 180 mg by mouth every morning. , Disp: , Rfl:  .  ferrous sulfate 325 (65 FE) MG tablet, Take 325 mg by mouth daily with breakfast., Disp: , Rfl:  .  fluticasone (FLONASE) 50 MCG/ACT nasal spray, Place 2 sprays into both nostrils daily., Disp: 16 g, Rfl: 5 .  Fluticasone-Umeclidin-Vilant (TRELEGY ELLIPTA) 100-62.5-25 MCG/INH AEPB, Inhale 1 puff into the lungs daily., Disp: 180 each, Rfl: 3 .  hydrochlorothiazide (HYDRODIURIL) 25 MG tablet, Take 25 mg by mouth every morning. , Disp: , Rfl:  .  meloxicam (MOBIC) 15 MG tablet, Take 15 mg by mouth daily., Disp: , Rfl:  .  metoprolol succinate (TOPROL-XL) 50 MG 24 hr tablet, Take 50 mg by mouth every morning. Take with or immediately following a meal., Disp: , Rfl:  .  omeprazole (PRILOSEC) 20 MG capsule, Take 20 mg by mouth daily. , Disp: , Rfl:  .  OVER THE COUNTER MEDICATION, CBD oil - alternating between hemp oil and CBD oil daily, Disp: , Rfl:  .  OXYGEN, Inhale 2-3 L into the lungs daily., Disp: , Rfl:  .  polyethylene glycol (MIRALAX / GLYCOLAX) packet, Take 17 g by mouth daily., Disp: , Rfl:  .  pravastatin (PRAVACHOL) 80 MG tablet, Take 80 mg by mouth every morning. , Disp: , Rfl:  .  spironolactone (ALDACTONE) 50 MG tablet, Take 25 mg by mouth every morning. , Disp: , Rfl:  .  temazepam (RESTORIL) 30 MG capsule, Take 30 mg by mouth at bedtime as needed for sleep. , Disp: , Rfl:     Review of Systems     Objective:   Physical Exam  Constitutional: She is oriented to person, place, and time. She appears well-developed and well-nourished. No distress.  HENT:  Head: Normocephalic and atraumatic.  Right Ear: External ear normal.  Left Ear: External ear normal.  Mouth/Throat: Oropharynx is clear and moist. No oropharyngeal exudate.  Eyes: Conjunctivae and  EOM are normal. Pupils are equal, round, and reactive to light. Right eye exhibits no discharge. Left eye exhibits no discharge. No scleral icterus.  Neck: Normal range of  motion. Neck supple. No JVD present. No tracheal deviation present. No thyromegaly present.  Cardiovascular: Normal rate, regular rhythm, normal heart sounds and intact distal pulses. Exam reveals no gallop and no friction rub.  No murmur heard. Pulmonary/Chest: Effort normal and breath sounds normal. No respiratory distress. She has no wheezes. She has no rales. She exhibits no tenderness.  barrell chest [place] o2   On +  Abdominal: Soft. Bowel sounds are normal. She exhibits no distension and no mass. There is no tenderness. There is no rebound and no guarding.  Musculoskeletal: Normal range of motion. She exhibits no edema or tenderness.  Lymphadenopathy:    She has no cervical adenopathy.  Neurological: She is alert and oriented to person, place, and time. She has normal reflexes. No cranial nerve deficit. She exhibits normal muscle tone. Coordination normal.  Skin: Skin is warm and dry. No rash noted. She is not diaphoretic. No erythema. No pallor.  Psychiatric: She has a normal mood and affect. Her behavior is normal. Judgment and thought content normal.  Vitals reviewed.  Vitals:   06/29/17 0908  BP: 112/64  Pulse: (!) 126  SpO2: 94%  Weight: 176 lb 6.4 oz (80 kg)  Height: 5\' 7"  (1.702 m)    Estimated body mass index is 27.63 kg/m as calculated from the following:   Height as of this encounter: 5\' 7"  (1.702 m).   Weight as of this encounter: 176 lb 6.4 oz (80 kg).      Assessment:       ICD-10-CM   1. Chronic respiratory failure with hypoxia (HCC) J96.11   2. COPD, severe ([place]) J44.9        Plan:      Glad COPD stable and you are up-to-date with the flu shot  Plan -Start Daliresp to prevent COPD exacerbations -Continue N-acetylcysteine to prevent COPD exacerbations -Continue triple inhaler therapy trilogy scheduled -Albuterol as needed -Continue schedule oxygen as before  Follow-up 3 months or sooner with Dr. Chase Caller   Dr. Brand Males, M.D.,  [facility].C.P Pulmonary and Critical Care Medicine Staff Physician, [place] Director - Interstitial Lung Disease  Program  [facility] [place] [facility] [place], [place], [address]  Pager: [phone], If no answer or between  15:00h - 7:00h: call [phone] Telephone: [phone]

## 2017-06-30 ENCOUNTER — Telehealth: Payer: Self-pay | Admitting: Internal Medicine

## 2017-06-30 MED ORDER — ROFLUMILAST 500 MCG PO TABS: 500.0000 ug | ORAL_TABLET | Freq: Every day | ORAL | 3 refills | Status: DC

## 2017-06-30 NOTE — Telephone Encounter (Signed)
Ok thanks 

## 2017-06-30 NOTE — Telephone Encounter (Signed)
Spoke with pt, she states the Daliresp is too expensive and wanted to [facility] the [facility] to see if it is cheaper. I offered pt assistance but she states she tried to go through [employer] and they denied her. I sent in the medications to the pharmacy requested. FYI MR.

## 2017-07-01 ENCOUNTER — Encounter (HOSPITAL_COMMUNITY): Payer: Self-pay

## 2017-07-06 ENCOUNTER — Encounter (HOSPITAL_COMMUNITY)
Admission: RE | Admit: 2017-07-06 | Discharge: 2017-07-06 | Disposition: A | Discharge status: Home / Self Care | Payer: Medicare HMO | Source: Ambulatory Visit | Attending: Internal Medicine | Admitting: Internal Medicine

## 2017-07-06 DIAGNOSIS — J449 Chronic obstructive pulmonary disease, unspecified: Secondary | ICD-10-CM | POA: Diagnosis not present

## 2017-07-06 DIAGNOSIS — J42 Unspecified chronic bronchitis: Secondary | ICD-10-CM | POA: Insufficient documentation

## 2017-07-07 DIAGNOSIS — I10 Essential (primary) hypertension: Secondary | ICD-10-CM | POA: Diagnosis not present

## 2017-07-07 DIAGNOSIS — Z6828 Body mass index (BMI) 28.0-28.9, adult: Secondary | ICD-10-CM | POA: Diagnosis not present

## 2017-07-07 DIAGNOSIS — J449 Chronic obstructive pulmonary disease, unspecified: Secondary | ICD-10-CM | POA: Diagnosis not present

## 2017-07-07 DIAGNOSIS — Z9981 Dependence on supplemental oxygen: Secondary | ICD-10-CM | POA: Diagnosis not present

## 2017-07-07 DIAGNOSIS — R600 Localized edema: Secondary | ICD-10-CM | POA: Diagnosis not present

## 2017-07-08 ENCOUNTER — Encounter (HOSPITAL_COMMUNITY): Payer: Medicare HMO

## 2017-07-13 ENCOUNTER — Encounter (HOSPITAL_COMMUNITY): Payer: Medicare HMO

## 2017-07-15 ENCOUNTER — Encounter (HOSPITAL_COMMUNITY): Payer: Medicare HMO

## 2017-07-15 ENCOUNTER — Encounter: Payer: Self-pay | Admitting: Internal Medicine

## 2017-07-16 ENCOUNTER — Telehealth: Payer: Self-pay | Admitting: Internal Medicine

## 2017-07-16 NOTE — Telephone Encounter (Signed)
Received forms placed in MR cubby. Will route to EP.

## 2017-07-19 NOTE — Telephone Encounter (Signed)
Faxed forms for pt. Nothing further needed.

## 2017-07-20 ENCOUNTER — Encounter (HOSPITAL_COMMUNITY)
Admission: RE | Admit: 2017-07-20 | Discharge: 2017-07-20 | Disposition: A | Discharge status: Home / Self Care | Payer: Medicare HMO | Source: Ambulatory Visit | Attending: Internal Medicine | Admitting: Internal Medicine

## 2017-07-21 NOTE — Telephone Encounter (Signed)
I spoke with Raquel Sarna and she states these were faxed at the beginning of the wk

## 2017-07-22 ENCOUNTER — Encounter (HOSPITAL_COMMUNITY): Payer: Medicare HMO

## 2017-07-22 DIAGNOSIS — H524 Presbyopia: Secondary | ICD-10-CM | POA: Diagnosis not present

## 2017-07-29 ENCOUNTER — Encounter (HOSPITAL_COMMUNITY)
Admission: RE | Admit: 2017-07-29 | Discharge: 2017-07-29 | Disposition: A | Discharge status: Home / Self Care | Payer: Medicare HMO | Source: Ambulatory Visit | Attending: Internal Medicine | Admitting: Internal Medicine

## 2017-08-05 ENCOUNTER — Encounter (HOSPITAL_COMMUNITY): Payer: Self-pay

## 2017-08-05 DIAGNOSIS — J42 Unspecified chronic bronchitis: Secondary | ICD-10-CM | POA: Insufficient documentation

## 2017-08-06 DIAGNOSIS — J449 Chronic obstructive pulmonary disease, unspecified: Secondary | ICD-10-CM | POA: Diagnosis not present

## 2017-08-10 ENCOUNTER — Encounter (HOSPITAL_COMMUNITY)
Admission: RE | Admit: 2017-08-10 | Discharge: 2017-08-10 | Disposition: A | Discharge status: Home / Self Care | Payer: Self-pay | Source: Ambulatory Visit | Attending: Internal Medicine | Admitting: Internal Medicine

## 2017-08-11 ENCOUNTER — Other Ambulatory Visit: Payer: Self-pay | Admitting: *Deleted

## 2017-08-12 ENCOUNTER — Encounter (HOSPITAL_COMMUNITY): Payer: Self-pay

## 2017-08-16 ENCOUNTER — Encounter: Payer: Self-pay | Admitting: Internal Medicine

## 2017-08-17 ENCOUNTER — Encounter (HOSPITAL_COMMUNITY)
Admission: RE | Admit: 2017-08-17 | Discharge: 2017-08-17 | Disposition: A | Discharge status: Home / Self Care | Payer: Self-pay | Source: Ambulatory Visit | Attending: Internal Medicine | Admitting: Internal Medicine

## 2017-08-19 ENCOUNTER — Encounter (HOSPITAL_COMMUNITY): Payer: Self-pay

## 2017-08-24 ENCOUNTER — Encounter (HOSPITAL_COMMUNITY): Payer: Self-pay

## 2017-08-24 DIAGNOSIS — J449 Chronic obstructive pulmonary disease, unspecified: Secondary | ICD-10-CM | POA: Diagnosis not present

## 2017-08-26 ENCOUNTER — Encounter (HOSPITAL_COMMUNITY): Payer: Self-pay

## 2017-08-31 ENCOUNTER — Encounter (HOSPITAL_COMMUNITY)
Admission: RE | Admit: 2017-08-31 | Discharge: 2017-08-31 | Disposition: A | Discharge status: Home / Self Care | Payer: Self-pay | Source: Ambulatory Visit | Attending: Internal Medicine | Admitting: Internal Medicine

## 2017-09-02 ENCOUNTER — Encounter (HOSPITAL_COMMUNITY): Payer: Self-pay

## 2017-09-06 DIAGNOSIS — J449 Chronic obstructive pulmonary disease, unspecified: Secondary | ICD-10-CM | POA: Diagnosis not present

## 2017-09-07 ENCOUNTER — Encounter (HOSPITAL_COMMUNITY)
Admission: RE | Admit: 2017-09-07 | Discharge: 2017-09-07 | Disposition: A | Discharge status: Home / Self Care | Payer: Self-pay | Source: Ambulatory Visit | Attending: Internal Medicine | Admitting: Internal Medicine

## 2017-09-07 DIAGNOSIS — J42 Unspecified chronic bronchitis: Secondary | ICD-10-CM | POA: Insufficient documentation

## 2017-09-09 ENCOUNTER — Encounter (HOSPITAL_COMMUNITY)
Admission: RE | Admit: 2017-09-09 | Discharge: 2017-09-09 | Disposition: A | Discharge status: Home / Self Care | Payer: Self-pay | Source: Ambulatory Visit | Attending: Internal Medicine | Admitting: Internal Medicine

## 2017-09-13 ENCOUNTER — Telehealth: Payer: Self-pay | Admitting: Internal Medicine

## 2017-09-13 NOTE — Telephone Encounter (Signed)
lmtcb for pt.  

## 2017-09-14 ENCOUNTER — Encounter (HOSPITAL_COMMUNITY): Payer: Self-pay

## 2017-09-14 MED ORDER — DOXYCYCLINE HYCLATE 100 MG PO TABS: 100.0000 mg | ORAL_TABLET | Freq: Two times a day (BID) | ORAL | 0 refills | Status: DC

## 2017-09-14 MED ORDER — PREDNISONE 10 MG PO TABS: ORAL_TABLET | ORAL | 0 refills | Status: DC

## 2017-09-14 NOTE — Telephone Encounter (Signed)
MR, patient complains of dry chronic cough, hoarseness, and congestion. Denies fever, body aches, and chest pain. Patient is requesting medication to be sent to her pharmacy. Please advise

## 2017-09-14 NOTE — Telephone Encounter (Signed)
Called pt and advised message from the provider. Pt understood and verbalized understanding. Nothing further is needed.   Rx's sent in.  

## 2017-09-14 NOTE — Telephone Encounter (Signed)
aecopd  Take doxycycline 100mg  po twice daily x 5 days; take after meals and avoid sunlight   Please take prednisone 40 mg x1 day, then 30 mg x1 day, then 20 mg x1 day, then 10 mg x1 day, and then 5 mg x1 day and stop

## 2017-09-16 ENCOUNTER — Encounter (HOSPITAL_COMMUNITY): Payer: Self-pay

## 2017-09-21 ENCOUNTER — Encounter (HOSPITAL_COMMUNITY)
Admission: RE | Admit: 2017-09-21 | Discharge: 2017-09-21 | Disposition: A | Discharge status: Home / Self Care | Payer: Self-pay | Source: Ambulatory Visit | Attending: Internal Medicine | Admitting: Internal Medicine

## 2017-09-22 ENCOUNTER — Encounter: Payer: Self-pay | Admitting: Internal Medicine

## 2017-09-23 ENCOUNTER — Encounter (HOSPITAL_COMMUNITY): Payer: Self-pay

## 2017-09-23 ENCOUNTER — Telehealth: Payer: Self-pay | Admitting: Internal Medicine

## 2017-09-23 MED ORDER — ALBUTEROL SULFATE HFA 108 (90 BASE) MCG/ACT IN AERS: 2.0000 | INHALATION_SPRAY | Freq: Four times a day (QID) | RESPIRATORY_TRACT | 3 refills | Status: DC | PRN

## 2017-09-23 MED ORDER — FLUTICASONE-UMECLIDIN-VILANT 100-62.5-25 MCG/INH IN AEPB: 1.0000 | INHALATION_SPRAY | Freq: Every day | RESPIRATORY_TRACT | 0 refills | Status: DC

## 2017-09-23 NOTE — Telephone Encounter (Signed)
Pt aware of recs.  Samples of trelegy left for pickup.  Will discuss med changes further with MR at 3/11 OV.  Nothing further needed at this time.

## 2017-09-23 NOTE — Telephone Encounter (Signed)
These are not the same devices / meds  Needs samples of trelegy and f/u asap with formulary to sort out best options

## 2017-09-23 NOTE — Telephone Encounter (Signed)
Called and spoke to pt. Pt states that Trelegy has a high co pay.  Pt is requesting to switch to Spiriva or Stiolto.  Pt states she is completely out of Trelegy and request an alternative today. Pt also states that Ventolin has a high co pay. I have spoken to [facility] with Rx assistance. Sunny Schlein states that [place] requires medicare part D pt's to spend 600 dollars out of pocket, before assistance can be provided. Sunny Schlein states that covered alternative is proventil. kamil states she will go ahead and fax over application for Proventil.   MW please advise on alternative, as MR is unavailable. Thanks    AVS 06/29/17:  1. Chronic respiratory failure with hypoxia (HCC) J96.11   2. COPD, severe ([place]) J44.9    Glad COPD stable and you are up-to-date with the flu shot  Plan -Start Daliresp to prevent COPD exacerbations -Continue N-acetylcysteine to prevent COPD exacerbations -Continue triple inhaler therapy trilogy scheduled -Albuterol as needed -Continue schedule oxygen as before  Follow-up 3 months or sooner with Dr. Chase Caller

## 2017-09-28 ENCOUNTER — Encounter (HOSPITAL_COMMUNITY)
Admission: RE | Admit: 2017-09-28 | Discharge: 2017-09-28 | Disposition: A | Discharge status: Home / Self Care | Payer: Self-pay | Source: Ambulatory Visit | Attending: Internal Medicine | Admitting: Internal Medicine

## 2017-09-30 ENCOUNTER — Encounter (HOSPITAL_COMMUNITY)
Admission: RE | Admit: 2017-09-30 | Discharge: 2017-09-30 | Disposition: A | Discharge status: Home / Self Care | Payer: Self-pay | Source: Ambulatory Visit | Attending: Internal Medicine | Admitting: Internal Medicine

## 2017-10-02 ENCOUNTER — Encounter (HOSPITAL_COMMUNITY): Payer: Self-pay | Admitting: Emergency Medicine

## 2017-10-02 ENCOUNTER — Other Ambulatory Visit: Payer: Self-pay

## 2017-10-02 ENCOUNTER — Emergency Department (HOSPITAL_COMMUNITY)
Admission: EM | Admit: 2017-10-02 | Discharge: 2017-10-03 | Disposition: A | Discharge status: Home / Self Care | Payer: Medicare HMO | Attending: Emergency Medicine | Admitting: Emergency Medicine

## 2017-10-02 DIAGNOSIS — R112 Nausea with vomiting, unspecified: Secondary | ICD-10-CM | POA: Diagnosis not present

## 2017-10-02 DIAGNOSIS — I10 Essential (primary) hypertension: Secondary | ICD-10-CM | POA: Insufficient documentation

## 2017-10-02 DIAGNOSIS — R51 Headache: Secondary | ICD-10-CM | POA: Insufficient documentation

## 2017-10-02 DIAGNOSIS — R197 Diarrhea, unspecified: Secondary | ICD-10-CM

## 2017-10-02 DIAGNOSIS — E860 Dehydration: Secondary | ICD-10-CM | POA: Insufficient documentation

## 2017-10-02 DIAGNOSIS — Z79899 Other long term (current) drug therapy: Secondary | ICD-10-CM | POA: Diagnosis not present

## 2017-10-02 DIAGNOSIS — Z7982 Long term (current) use of aspirin: Secondary | ICD-10-CM | POA: Insufficient documentation

## 2017-10-02 DIAGNOSIS — J449 Chronic obstructive pulmonary disease, unspecified: Secondary | ICD-10-CM | POA: Insufficient documentation

## 2017-10-02 DIAGNOSIS — Z87891 Personal history of nicotine dependence: Secondary | ICD-10-CM | POA: Diagnosis not present

## 2017-10-02 LAB — URINALYSIS, ROUTINE W REFLEX MICROSCOPIC
BACTERIA UA: NONE SEEN
Bilirubin Urine: NEGATIVE
Glucose, UA: NEGATIVE mg/dL
Hgb urine dipstick: NEGATIVE
Ketones, ur: NEGATIVE mg/dL
Nitrite: NEGATIVE
PH: 5 (ref 5.0–8.0)
Protein, ur: NEGATIVE mg/dL
SPECIFIC GRAVITY, URINE: 1.003 — AB (ref 1.005–1.030)

## 2017-10-02 LAB — COMPREHENSIVE METABOLIC PANEL
ALT: 15 U/L (ref 14–54)
AST: 23 U/L (ref 15–41)
Albumin: 4.4 g/dL (ref 3.5–5.0)
Alkaline Phosphatase: 85 U/L (ref 38–126)
Anion gap: 12 (ref 5–15)
BUN: 14 mg/dL (ref 6–20)
CHLORIDE: 95 mmol/L — AB (ref 101–111)
CO2: 29 mmol/L (ref 22–32)
CREATININE: 1.3 mg/dL — AB (ref 0.44–1.00)
Calcium: 10.1 mg/dL (ref 8.9–10.3)
GFR calc Af Amer: 50 mL/min — ABNORMAL LOW (ref >60)
GFR, EST NON AFRICAN AMERICAN: 43 mL/min — AB (ref >60)
GLUCOSE: 111 mg/dL — AB (ref 65–99)
Potassium: 3.9 mmol/L (ref 3.5–5.1)
SODIUM: 136 mmol/L (ref 135–145)
Total Bilirubin: 0.4 mg/dL (ref 0.3–1.2)
Total Protein: 7.6 g/dL (ref 6.5–8.1)

## 2017-10-02 LAB — CBC
HEMATOCRIT: 36.5 % (ref 36.0–46.0)
Hemoglobin: 11.6 g/dL — ABNORMAL LOW (ref 12.0–15.0)
MCH: 28.6 pg (ref 26.0–34.0)
MCHC: 31.8 g/dL (ref 30.0–36.0)
MCV: 89.9 fL (ref 78.0–100.0)
PLATELETS: 307 10*3/uL (ref 150–400)
RBC: 4.06 MIL/uL (ref 3.87–5.11)
RDW: 12.3 % (ref 11.5–15.5)
WBC: 9.9 10*3/uL (ref 4.0–10.5)

## 2017-10-02 LAB — LIPASE, BLOOD: LIPASE: 21 U/L (ref 11–51)

## 2017-10-02 MED ORDER — SODIUM CHLORIDE 0.9 % IV BOLUS (SEPSIS)
500.0000 mL | Freq: Once | INTRAVENOUS | Status: AC
  Administered 2017-10-02: 500 mL via INTRAVENOUS

## 2017-10-02 MED ORDER — ONDANSETRON HCL 4 MG/2ML IJ SOLN
4.0000 mg | Freq: Once | INTRAMUSCULAR | Status: AC
  Administered 2017-10-02: 4 mg via INTRAVENOUS
  Filled 2017-10-02: qty 2

## 2017-10-02 MED ORDER — ACETAMINOPHEN 325 MG PO TABS
650.0000 mg | ORAL_TABLET | Freq: Once | ORAL | Status: AC
  Administered 2017-10-02: 650 mg via ORAL
  Filled 2017-10-02: qty 2

## 2017-10-02 NOTE — ED Triage Notes (Signed)
Patient c/o intermittent abdominal pain with N/V and cough x2 days. Denies diarrhea. Hx COPD on 3L [place] baseline.

## 2017-10-02 NOTE — ED Provider Notes (Signed)
[facility] DEPT Provider Note   CSN: [id number] Arrival date & time: 10/02/17  1849     History   Chief Complaint Chief Complaint  Patient presents with  . Emesis  . Headache    HPI Katie Greene is a 62 y.o. female.  The history is provided by the patient. No language interpreter was used.  Emesis   Associated symptoms include headaches.  Headache   Associated symptoms include vomiting.    Katie Greene is a 62 y.o. female who presents to the Emergency Department complaining of vomiting.  She reports nausea/vomiting that began yesterday morning, unable to eat due to recurrent vomiting.  She has had multiple episodes of emesis, three since arriving to the ED.  Denies fevers, chest pain. Has chills, diarrhea, mild lower abdominal pain, headaches.  Headache developed a few days before then nausea and vomiting and diarrhea is mild and intermittent in nature.  She has chronic sob - stable for her.      Past Medical History:  Diagnosis Date  . Anxiety   . Arthritis   . Chest pain    Improved  . 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 Depression   . Fatigue   . GERD (gastroesophageal reflux disease)   . Hemorrhoids   . Hyperlipidemia   . Hypertension   . On home oxygen therapy    oxygen therapy 2-3 L/m nasally 24/7  . Palpitations     Patient Active Problem List   Diagnosis Date Noted  . Blood in stool   . History of colonic polyps   . Dyspnea and respiratory abnormality 12/12/2014  . Patient in clinical research study 08/23/2014  . COPD, severe ([place]) 08/23/2014  . Research exam 01/04/2014  . Leg cramps 09/28/2013  . Chronic respiratory failure ([place]) 09/28/2013  . Postnasal drip 12/09/2012  . Hemoptysis 09/22/2012  . COPD exacerbation ([place]) 09/22/2012  . History of smoking 09/22/2012  . Smoking history 06/29/2012  . Cancer screening 06/29/2012  . COPD (chronic obstructive pulmonary disease)  ([place]) 06/12/2012    Past Surgical History:  Procedure Laterality Date  . CHOLECYSTECTOMY     laparosopic attempted, then had to open  . COLONOSCOPY     x2 with polyps removed  . COLONOSCOPY N/A 08/27/2015   Procedure: COLONOSCOPY;  Surgeon: Manus Gunning, MD;  Location: [facility];  Service: Gastroenterology;  Laterality: N/A;  . oopherectomy     1 ovary removed only  . POLYPECTOMY    . SALPINGECTOMY     1 fallopian tube removed    OB History    No data available       Home Medications    Prior to Admission medications   Medication Sig Start Date End Date Taking? Authorizing Provider  Acetylcysteine 600 MG CAPS Take 1 capsule by mouth 2 (two) times daily.    [provider]  albuterol (PROVENTIL) (2.5 MG/3ML) 0.083% nebulizer solution Take 3 mLs (2.5 mg total) by nebulization every 4 (four) hours as needed for wheezing or shortness of breath. Dx code j44.9 03/24/17   Brand Males, MD  albuterol (VENTOLIN HFA) 108 (90 Base) MCG/ACT inhaler Inhale 2 puffs into the lungs every 6 (six) hours as needed for wheezing or shortness of breath. 09/23/17   Brand Males, MD  ALPRAZolam Duanne Moron) 0.5 MG tablet Take 0.5 mg by mouth 3 (three) times daily as needed for anxiety.  05/09/15   [provider]  aspirin EC 81 MG tablet Take 81 mg by mouth every morning.     [provider]  buPROPion (WELLBUTRIN SR) 100 MG 12 hr tablet Take 100 mg by mouth every morning.  05/08/15   [provider]  cetirizine (ZYRTEC) 10 MG tablet Take 10 mg by mouth at bedtime.    [provider]  cholecalciferol (VITAMIN D) 1000 UNITS tablet Take 1,000 Units by mouth daily.    [provider]  diltiazem (CARDIZEM CD) 180 MG 24 hr capsule Take 180 mg by mouth every morning.     [provider]  doxycycline (VIBRA-TABS) 100 MG tablet Take 1 tablet (100 mg total) by mouth 2 (two) times daily. 09/14/17   Brand Males, MD  ferrous sulfate  325 (65 FE) MG tablet Take 325 mg by mouth daily with breakfast.    [provider]  fluticasone (FLONASE) 50 MCG/ACT nasal spray Place 2 sprays into both nostrils daily. 03/23/17   Brand Males, MD  Fluticasone-Umeclidin-Vilant (TRELEGY ELLIPTA) 100-62.5-25 MCG/INH AEPB Inhale 1 puff into the lungs daily. 06/04/17   Brand Males, MD  Fluticasone-Umeclidin-Vilant (TRELEGY ELLIPTA) 100-62.5-25 MCG/INH AEPB Inhale 1 puff into the lungs daily. 09/23/17   Tanda Rockers, MD  hydrochlorothiazide (HYDRODIURIL) 25 MG tablet Take 25 mg by mouth every morning.     [provider]  meloxicam (MOBIC) 15 MG tablet Take 15 mg by mouth daily.    [provider]  metoprolol succinate (TOPROL-XL) 50 MG 24 hr tablet Take 50 mg by mouth every morning. Take with or immediately following a meal.    [provider]  omeprazole (PRILOSEC) 20 MG capsule Take 20 mg by mouth daily.  05/08/15   [provider]  OVER THE COUNTER MEDICATION CBD oil - alternating between hemp oil and CBD oil daily    [provider]  OXYGEN Inhale 2-3 L into the lungs daily.    [provider]  polyethylene glycol (MIRALAX / GLYCOLAX) packet Take 17 g by mouth daily.    [provider]  pravastatin (PRAVACHOL) 80 MG tablet Take 80 mg by mouth every morning.     [provider]  predniSONE (DELTASONE) 10 MG tablet Take 4 tabs for 1 day, then 3 tabs for 1 day, 2 tabs for 1 day, 1 tab for 1 days,  0.5 tab for 1 day then stop. 09/14/17   Brand Males, MD  roflumilast (DALIRESP) 500 MCG TABS tablet Take 1 tablet (500 mcg total) by mouth daily. 06/29/17   Brand Males, MD  roflumilast (DALIRESP) 500 MCG TABS tablet Take 1 tablet (500 mcg total) by mouth daily. 06/30/17   Brand Males, MD  spironolactone (ALDACTONE) 50 MG tablet Take 25 mg by mouth every morning.     [provider]  temazepam (RESTORIL) 30 MG capsule Take 30 mg by mouth at  bedtime as needed for sleep.  05/07/15   [provider]    Family History Family History  Problem Relation Age of Onset  . Heart attack Mother   . Pancreatic cancer Mother   . Prostate cancer Father   . Hypertension Father   . Lung cancer Father   . Throat cancer Brother        Throat Cancer  . Colon cancer Sister     Social History Social History   Tobacco Use  . Smoking status: Former Smoker    Packs/day: 0.50    Years: 25.00    Pack years: 12.50  Types: Cigarettes    Last attempt to quit: 03/02/2012    Years since quitting: 5.5  . Smokeless tobacco: Never Used  . Tobacco comment: pt states she smoked some and stopped again 8-13.  Substance Use Topics  . Alcohol use: No  . Drug use: No     Allergies   Patient has no known allergies.   Review of Systems Review of Systems  Gastrointestinal: Positive for vomiting.  Neurological: Positive for headaches.  All other systems reviewed and are negative.    Physical Exam Updated Vital Signs BP 127/72 (BP Location: Left Arm)   Pulse 92   Temp 98.2 F (36.8 C) (Oral)   Resp (!) 22   Ht 5\' 7"  (1.702 m)   Wt 75.3 kg (166 lb)   SpO2 98%   BMI 26.00 kg/m   Physical Exam  Constitutional: She is oriented to person, place, and time. She appears well-developed and well-nourished.  HENT:  Head: Normocephalic and atraumatic.  Eyes: EOM are normal.  Cardiovascular: Regular rhythm.  No murmur heard. tachycardic  Pulmonary/Chest: Effort normal. No respiratory distress.  Faint end expiratory wheezes  Abdominal: Soft. There is no tenderness. There is no rebound and no guarding.  Musculoskeletal: She exhibits no tenderness.  Trace nonpitting edema to BLE  Neurological: She is alert and oriented to person, place, and time.  5/5 strength in all four extremities.    Skin: Skin is warm and dry.  Psychiatric: She has a normal mood and affect. Her behavior is normal.  Nursing note and vitals reviewed.    ED  Treatments / Results  Labs (all labs ordered are listed, but only abnormal results are displayed) Labs Reviewed  COMPREHENSIVE METABOLIC PANEL - Abnormal; Notable for the following components:      Result Value   Chloride 95 (*)    Glucose, Bld 111 (*)    Creatinine, Ser 1.30 (*)    GFR calc non Af Amer 43 (*)    GFR calc Af Amer 50 (*)    All other components within normal limits  CBC - Abnormal; Notable for the following components:   Hemoglobin 11.6 (*)    All other components within normal limits  URINALYSIS, ROUTINE W REFLEX MICROSCOPIC - Abnormal; Notable for the following components:   Color, Urine STRAW (*)    Specific Gravity, Urine 1.003 (*)    Leukocytes, UA TRACE (*)    Squamous Epithelial / LPF 0-5 (*)    All other components within normal limits  LIPASE, BLOOD    EKG  EKG Interpretation None       Radiology No results found.  Procedures Procedures (including critical care time)  Medications Ordered in ED Medications  sodium chloride 0.9 % bolus 500 mL (0 mLs Intravenous Stopped 10/02/17 2359)  ondansetron (ZOFRAN) injection 4 mg (4 mg Intravenous Given 10/02/17 2324)  acetaminophen (TYLENOL) tablet 650 mg (650 mg Oral Given 10/02/17 2359)     Initial Impression / Assessment and Plan / ED Course  I have reviewed the triage vital signs and the nursing notes.  Pertinent labs & imaging results that were available during my care of the patient were reviewed by me and considered in my medical decision making (see chart for details).     Patient with history of COPD here for evaluation of nausea, vomiting, diarrhea with mild associated headache.  She is nontoxic appearing on examination with no significant abdominal tenderness.  Following treatment with antiemetics, IV fluid she is feeling improved.  Presentation is not consistent with acute abdomen, subarachnoid hemorrhage, meningitis.  Plan to discharge home with outpatient follow-up and return  precautions.  Final Clinical Impressions(s) / ED Diagnoses   Final diagnoses:  Nausea vomiting and diarrhea  Dehydration    ED Discharge Orders    None       Quintella Reichert, MD 10/03/17 0140

## 2017-10-03 ENCOUNTER — Emergency Department (HOSPITAL_COMMUNITY): Payer: Medicare HMO

## 2017-10-03 DIAGNOSIS — R51 Headache: Secondary | ICD-10-CM | POA: Diagnosis not present

## 2017-10-03 MED ORDER — ONDANSETRON 4 MG PO TBDP: 4.0000 mg | ORAL_TABLET | Freq: Three times a day (TID) | ORAL | 0 refills | Status: DC | PRN

## 2017-10-03 NOTE — ED Notes (Signed)
Pt reports improvement in nausea and is drinking on Ginger Ale without difficulty at this time.

## 2017-10-04 DIAGNOSIS — J449 Chronic obstructive pulmonary disease, unspecified: Secondary | ICD-10-CM | POA: Diagnosis not present

## 2017-10-07 ENCOUNTER — Encounter (HOSPITAL_COMMUNITY): Payer: Self-pay

## 2017-10-07 DIAGNOSIS — K219 Gastro-esophageal reflux disease without esophagitis: Secondary | ICD-10-CM | POA: Diagnosis not present

## 2017-10-07 DIAGNOSIS — J449 Chronic obstructive pulmonary disease, unspecified: Secondary | ICD-10-CM | POA: Diagnosis not present

## 2017-10-07 DIAGNOSIS — Z6827 Body mass index (BMI) 27.0-27.9, adult: Secondary | ICD-10-CM | POA: Diagnosis not present

## 2017-10-07 DIAGNOSIS — I10 Essential (primary) hypertension: Secondary | ICD-10-CM | POA: Diagnosis not present

## 2017-10-07 DIAGNOSIS — R112 Nausea with vomiting, unspecified: Secondary | ICD-10-CM | POA: Diagnosis not present

## 2017-10-07 DIAGNOSIS — J42 Unspecified chronic bronchitis: Secondary | ICD-10-CM | POA: Insufficient documentation

## 2017-10-11 ENCOUNTER — Telehealth: Payer: Self-pay | Admitting: Internal Medicine

## 2017-10-11 ENCOUNTER — Other Ambulatory Visit (INDEPENDENT_AMBULATORY_CARE_PROVIDER_SITE_OTHER): Payer: Medicare HMO

## 2017-10-11 ENCOUNTER — Ambulatory Visit: Payer: Medicare HMO | Admitting: Internal Medicine

## 2017-10-11 ENCOUNTER — Encounter: Payer: Self-pay | Admitting: Internal Medicine

## 2017-10-11 VITALS — BP 130/76 | HR 102 | Ht 67.0 in | Wt 168.6 lb

## 2017-10-11 DIAGNOSIS — J9611 Chronic respiratory failure with hypoxia: Secondary | ICD-10-CM

## 2017-10-11 DIAGNOSIS — J449 Chronic obstructive pulmonary disease, unspecified: Secondary | ICD-10-CM | POA: Diagnosis not present

## 2017-10-11 DIAGNOSIS — N179 Acute kidney failure, unspecified: Secondary | ICD-10-CM

## 2017-10-11 DIAGNOSIS — B370 Candidal stomatitis: Secondary | ICD-10-CM | POA: Diagnosis not present

## 2017-10-11 LAB — BASIC METABOLIC PANEL
BUN: 18 mg/dL (ref 6–23)
CO2: 33 meq/L — AB (ref 19–32)
Calcium: 10.3 mg/dL (ref 8.4–10.5)
Chloride: 98 mEq/L (ref 96–112)
Creatinine, Ser: 1.6 mg/dL — ABNORMAL HIGH (ref 0.40–1.20)
GFR: 41.75 mL/min — ABNORMAL LOW (ref >60.00)
GLUCOSE: 126 mg/dL — AB (ref 70–99)
POTASSIUM: 4.3 meq/L (ref 3.5–5.1)
SODIUM: 139 meq/L (ref 135–145)

## 2017-10-11 MED ORDER — NYSTATIN 100000 UNIT/ML MT SUSP: 5.0000 mL | Freq: Four times a day (QID) | OROMUCOSAL | 0 refills | Status: DC

## 2017-10-11 MED ORDER — CLOTRIMAZOLE 10 MG MT TROC: 10.0000 mg | Freq: Every day | OROMUCOSAL | 0 refills | Status: DC

## 2017-10-11 NOTE — Telephone Encounter (Signed)
Spoke with MR he has requested I route this message to him. Called and spoke with pharmacy to advise them of this. Will call her back once we hear from MR.   MR please advise on this. Thanks.

## 2017-10-11 NOTE — Patient Instructions (Addendum)
Chronic respiratory failure with hypoxia (HCC) COPD, severe (HCC)  - stable disease  Plan - continue o2 and your inhalers; ensure adequate rinsing of mouth  - have emailed rehab as to why they are not checking BP with maintenance rehab   AKI (acute kidney injury) ([place]) - mild dehdyration 10/02/17; recheck bmet 10/11/2017   Thrush, oral # Oral thrush - For Oral thrush: Take Suspension (swish and swallow): 500,000 units 4 times/day for 5 days; swish in the mouth and retain for as long as possible (several minutes) before swallowing  Followup 3 months or sooner if needed

## 2017-10-11 NOTE — Progress Notes (Signed)
Subjective:     Patient ID: Katie Greene, female   DOB: 1956/05/25, 62 y.o.   MRN: [id number]  HPI   HPI    OV 02/18/2016  Chief Complaint  Patient presents with  . Follow-up    pt c/o wheezing, runny nose worse qhs- tries to stay indoors d/t heat and humidity worsening her SOB.  Pt requesting an order for a wheelchair.     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 The summer heat is bothering her and she is trying to stay indoors. In accounting for the heat she does not seem to be in an exacerbation. Her sister passed away and she was planning to move to [place] to be with her daughter but these plans of change. She inherited some large piece of land. She's disposing that right now and with that she'll be able to afford living and [place]. Early August 2017 she is going to trip to [place] with her daughter for a family reunion and medication. She wants a wheelchair.  OV 05/26/2016  Chief Complaint  Patient presents with  . Follow-up    Pt states her SOB has slightly worsened since last OV in 02/2016. Pt states her cough is at baseline - with little mucus production with yellow to green mucus. Pt states her appetite has decreased. Pt denies CP/tightness and f/c/s.    [place] 3- 4 3. Chronic hypoxemic respilure.Presents for routine follow-up. She visited [place], [place] for a family reunion in August 2017. This went well without any exacerbation. Overall she is doing well. Shortness of breath and cough is at baseline. There no new issues. She now has a wheelchair. She uses 3 L of oxygen at baseline but sometimes s2    OV 09/02/2016  Chief Complaint  Patient presents with  . Follow-up    Pt states overall her breathing is unchanged. Pt c/o hoarseness. Pt deneis significant cough and CP/tightness.    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 Presents for routine follow-up. At this point in time COPD stable.  She is compliant with the Spiriva and Symbicort. She is 3 L of oxygen at baseline. She's undergoing grief counseling because of her sister's death. There are no new issues she is up-to-date with flu shot. She wants me to fill out some financial assistance form for a Symbicort    OV 12/31/2016  Chief Complaint  Patient presents with  . Follow-up    Pt states her SOB has slightly more SOB. Pt c/o prod cough with little mucus - green in color. Pt denies CP/tightness.    Follow-up Gold stage IV COPD. She is on triple inhaler therapy with Spiriva and Symbicort. She says this is working well for her. There are no new issues. She feels COPD is best ever. She recently in April 2018 separated from her husband and she feels emotionally better as a result. CAT score is 27 and is baseline. USes o2 3L [place]   OV 03/09/2017  Chief Complaint  Patient presents with  . Acute Visit    Pt was given abx and pred but states she only slightly feels improved. Pt c/o SOB and prod cough with yellow mucus and chest tightness. Pt denies f/c/s.    Gold stage IV COPD patient. She is recently separated from her husband is having insurance difficulties. We did help her with sample of triple inhaler therapy combo trelegy/ . She continues with 4 L nasal oxygen at  baseline. Mid July 2008 and she went to [place], [place] for a family get together and was exposed to sick people. She called was end of July 2018 and we treated her with 5 days of cephalexin and prednisone for COPD flareup but she's not any better. She still having significant cough and wheezing and white phlegm with occasional yellow tint. She is worried she has pneumonia. Her friend recently buried her son with pneumonia. There is no fever or chills or edema or hemoptysis. In the office she is wheezing which is a little bit unusual for her.   OV 06/29/2017  Chief Complaint  Patient presents with  . Follow-up    Pt states that she has been doing okay. States that  she has an occ. cough, no change in the SOB. Denies any CP.     Follow-up advanced COPD Gold stage IV with chronic hypoxemic restaurant failure.  Last visit she was in exacerbation.  After the exacerbation resolved be tested for hypercapnia but her PCO2 is 45.  Therefore she did not qualify for nocturnal BiPAP.  Currently she feels fine.  COPD CAT score is 27 documented below and is at baseline.  She is on triple inhaler therapy by [place].  She was on the study for this and it worked well for her.  She is also on albuterol as needed chronic oxygen and N-acetylcysteine to prevent COPD exacerbations.  She is interested in [employer] to prevent COPD exacerbations.  She is up-to-date with her flu shot.   OV 10/11/2017  Chief Complaint  Patient presents with  . Follow-up    Pt states she has been doing good since last visit. States she has a mild cough with clear mucus. DME: [employer], 4L pulse     Follow-up advanced COPD Gold stage IV with chronic hypoxemic restaurant failure.  Presents for routine follow-up.  She is compliant with her oxygen and nebulizer/inhaler.  No new issues.  COPD CAT score is 30 and reflects a slight uptake in her basic symptom profile.  There is no exacerbation issues.  She tells me that on October 02, 2017 she ended up in the ER with nausea and vomiting.  Review of the lab shows mild increase in her creatinine and she was told she was dehydrated and to drink a lot of fluids.  She says she feels fine now but wants a creatinine rechecked.  She is also frustrated with pulmonary rehabilitation department because she is on maintenance phase of rehabilitation and did not checking her vital signs especially blood pressure.  On exam she seems to have thrush.   CAT COPD Symptom & Quality of Life Score (GSK trademark) 0 is no burden. 5 is highest burden 12/31/2016  03/09/2017  10/11/2017   Never Cough -> Cough all the time 3 2 4   No phlegm in chest -> Chest is full of phlegm 3 2 3   No chest  tightness -> Chest feels very tight 3 3 3   No dyspnea for 1 flight stairs/hill -> Very dyspneic for 1 flight of stairs 5 5 5   No limitations for ADL at home -> Very limited with ADL at home 5 3 5   Confident leaving home -> Not at all confident leaving home 2 5 3   Sleep soundly -> Do not sleep soundly because of lung condition 2 4 3   Lots of Energy -> No energy at all 4 3 3   TOTAL Score (max 40)  27 27 30  has a past medical history of Anxiety, Arthritis, Chest pain, COPD (chronic obstructive pulmonary disease) ([place]), Depression, Fatigue, GERD (gastroesophageal reflux disease), Hemorrhoids, Hyperlipidemia, Hypertension, On home oxygen therapy, and Palpitations.   reports that she quit smoking about 5 years ago. Her smoking use included cigarettes. She has a 12.50 pack-year smoking history. she has never used smokeless tobacco.  Past Surgical History:  Procedure Laterality Date  . CHOLECYSTECTOMY     laparosopic attempted, then had to open  . COLONOSCOPY     x2 with polyps removed  . COLONOSCOPY N/A 08/27/2015   Procedure: COLONOSCOPY;  Surgeon: Manus Gunning, MD;  Location: [facility];  Service: Gastroenterology;  Laterality: N/A;  . oopherectomy     1 ovary removed only  . POLYPECTOMY    . SALPINGECTOMY     1 fallopian tube removed    No Known Allergies  Immunization History  Administered Date(s) Administered  . Influenza Split 06/10/2012, 04/03/2014, 04/20/2015  . Influenza,inj,Quad PF,6+ Mos 04/24/2013, 03/03/2016  . Influenza-Unspecified 05/29/2017  . Pneumococcal Conjugate-13 05/07/2015  . Pneumococcal Polysaccharide-23 06/10/2012  . Zoster 05/10/2014    Family History  Problem Relation Age of Onset  . Heart attack Mother   . Pancreatic cancer Mother   . Prostate cancer Father   . Hypertension Father   . Lung cancer Father   . Throat cancer Brother        Throat Cancer  . Colon cancer Sister      Current Outpatient Medications:  .   Acetylcysteine 600 MG CAPS, Take 1 capsule by mouth 2 (two) times daily., Disp: , Rfl:  .  albuterol (PROVENTIL) (2.5 MG/3ML) 0.083% nebulizer solution, Take 3 mLs (2.5 mg total) by nebulization every 4 (four) hours as needed for wheezing or shortness of breath. Dx code j44.9, Disp: 125 mL, Rfl: 6 .  albuterol (VENTOLIN HFA) 108 (90 Base) MCG/ACT inhaler, Inhale 2 puffs into the lungs every 6 (six) hours as needed for wheezing or shortness of breath., Disp: 3 Inhaler, Rfl: 3 .  ALPRAZolam (XANAX) 0.5 MG tablet, Take 0.5 mg by mouth 3 (three) times daily as needed for anxiety. , Disp: , Rfl:  .  aspirin EC 81 MG tablet, Take 81 mg by mouth every morning. , Disp: , Rfl:  .  buPROPion (ZYBAN) 150 MG 12 hr tablet, Take 150 mg by mouth daily after breakfast., Disp: , Rfl:  .  cetirizine (ZYRTEC) 10 MG tablet, Take 10 mg by mouth at bedtime., Disp: , Rfl:  .  cholecalciferol (VITAMIN D) 1000 UNITS tablet, Take 1,000 Units by mouth daily., Disp: , Rfl:  .  diltiazem (CARDIZEM CD) 180 MG 24 hr capsule, Take 180 mg by mouth every morning. , Disp: , Rfl:  .  ferrous sulfate 325 (65 FE) MG tablet, Take 325 mg by mouth daily with breakfast., Disp: , Rfl:  .  fluticasone (FLONASE) 50 MCG/ACT nasal spray, Place 2 sprays into both nostrils daily., Disp: 16 g, Rfl: 5 .  Fluticasone-Umeclidin-Vilant (TRELEGY ELLIPTA) 100-62.5-25 MCG/INH AEPB, Inhale 1 puff into the lungs daily., Disp: 2 each, Rfl: 0 .  hydrochlorothiazide (HYDRODIURIL) 25 MG tablet, Take 25 mg by mouth every morning. , Disp: , Rfl:  .  meloxicam (MOBIC) 15 MG tablet, Take 15 mg by mouth daily., Disp: , Rfl:  .  metoprolol succinate (TOPROL-XL) 50 MG 24 hr tablet, Take 50 mg by mouth every morning. Take with or immediately following a meal., Disp: , Rfl:  .  omeprazole (PRILOSEC) 20  MG capsule, Take 20 mg by mouth daily. , Disp: , Rfl:  .  OXYGEN, Inhale 2-3 L into the lungs daily., Disp: , Rfl:  .  polyethylene glycol (MIRALAX / GLYCOLAX) packet,  Take 17 g by mouth daily., Disp: , Rfl:  .  pravastatin (PRAVACHOL) 80 MG tablet, Take 80 mg by mouth every morning. , Disp: , Rfl:  .  roflumilast (DALIRESP) 500 MCG TABS tablet, Take 1 tablet (500 mcg total) by mouth daily., Disp: 30 tablet, Rfl: 3 .  spironolactone (ALDACTONE) 50 MG tablet, Take 25 mg by mouth every morning. , Disp: , Rfl:  .  temazepam (RESTORIL) 30 MG capsule, Take 30 mg by mouth at bedtime as needed for sleep. , Disp: , Rfl:  .  ondansetron (ZOFRAN ODT) 4 MG disintegrating tablet, Take 1 tablet (4 mg total) by mouth every 8 (eight) hours as needed for nausea or vomiting. (Patient not taking: Reported on 10/11/2017), Disp: 10 tablet, Rfl: 0   Review of Systems     Objective:   Physical Exam Vitals:   10/11/17 1000  BP: 130/76  Pulse: (!) 102  SpO2: 100%  Weight: 168 lb 9.6 oz (76.5 kg)  Height: 5\' 7"  (1.702 m)    Estimated body mass index is 26.41 kg/m as calculated from the following:   Height as of this encounter: 5\' 7"  (1.702 m).   Weight as of this encounter: 168 lb 9.6 oz (76.5 kg).     Assessment:       ICD-10-CM   1. Chronic respiratory failure with hypoxia (HCC) J96.11   2. COPD, severe ([place]) J44.9   3. AKI (acute kidney injury) ([place]) N17.9   4. Thrush, oral B37.0        Plan:     Chronic respiratory failure with hypoxia (HCC) COPD, severe (HCC)  - stable disease  Plan - continue o2 and your inhalers; ensure adequate rinsing of mouth  - - have emailed rehab as to why they are not checking BP with maintenance rehab   AKI (acute kidney injury) ([place]) - mild dehdyration 10/02/17; recheck bmet 10/11/2017   Thrush, oral # Oral thrush - For Oral thrush: Take Suspension (swish and swallow): 500,000 units 4 times/day for 5 days; swish in the mouth and retain for as long as possible (several minutes) before swallowing   Dr. Brand Males, M.D., [facility].C.P Pulmonary and Critical Care Medicine Staff Physician, [place]  Director - Interstitial Lung Disease  Program  Pulmonary [place] at [place], [place], [address]  Pager: [phone], If no answer or between  15:00h - 7:00h: call [phone] Telephone: [phone]

## 2017-10-11 NOTE — Telephone Encounter (Signed)
Called and spoke with Katie Greene, she needed clarification of the quantity for the medication called in. Information given nothing further needed.

## 2017-10-11 NOTE — Telephone Encounter (Signed)
 #   Oral thrush - For Oral thrush: IF nystatin is in back order: alternatives are per [facility]  A) clotrimazole troche (throt lozenge) 10mg  dissolved and swish and swallow 5 times a day for 14 days. Or B  B) Miconazole 2% oral gel  Please check with pharmacy if #A is avialalbe  - if so I prefer that  Dr. Brand Males, M.D., [facility].C.P Pulmonary and Critical Care Medicine Staff Physician, [place] Director - Interstitial Lung Disease  Program  Pulmonary [place] at [place], [place], [address]  Pager: [phone], If no answer or between  15:00h - 7:00h: call [phone] Telephone: [phone]

## 2017-10-11 NOTE — Telephone Encounter (Signed)
Pt is calling back [phone]

## 2017-10-11 NOTE — Telephone Encounter (Signed)
Spoke with Katie Greene at [facility], clotrimazole has been called in.   Spoke with patient, she is aware of the medication change.   Nothing else needed at time of call.

## 2017-10-12 ENCOUNTER — Encounter (HOSPITAL_COMMUNITY)
Admission: RE | Admit: 2017-10-12 | Discharge: 2017-10-12 | Disposition: A | Discharge status: Home / Self Care | Payer: Self-pay | Source: Ambulatory Visit | Attending: Internal Medicine | Admitting: Internal Medicine

## 2017-10-12 ENCOUNTER — Telehealth: Payer: Self-pay | Admitting: Internal Medicine

## 2017-10-12 DIAGNOSIS — Z006 Encounter for examination for normal comparison and control in clinical research program: Secondary | ICD-10-CM

## 2017-10-12 NOTE — Telephone Encounter (Signed)
Her creatinine is rising FAST and is clearly abnormal now. WAs high normal at ER. She has Acute kidney injury  PLAN  - ask her if she is taking her meloxicam and what dose? And for how long? - and how long has she been on HCTZ   Dr. Brand Males, M.D., [facility].C.P Pulmonary and Critical Care Medicine Staff Physician, [place] Director - Interstitial Lung Disease  Program  Pulmonary [place] at [place], [place], [address]  Pager: [phone], If no answer or between  15:00h - 7:00h: call [phone] Telephone: [place]  Lab 10/11/17 1041  CREATININE 1.60*

## 2017-10-13 ENCOUNTER — Encounter: Payer: Self-pay | Admitting: Internal Medicine

## 2017-10-13 NOTE — Telephone Encounter (Signed)
ATC pt, no answer. Left message for pt to call back.  

## 2017-10-13 NOTE — Telephone Encounter (Signed)
Pt is calling back [phone]

## 2017-10-13 NOTE — Telephone Encounter (Signed)
Meloxicam 15 mg, she has taken this everyday for years. According to her chart  The last first RX may have been written 12/26/2011  She has been on HCTZ for years as well. According to her chart she may have been on HCTZ since 08/23/2014

## 2017-10-14 ENCOUNTER — Other Ambulatory Visit: Payer: Self-pay | Admitting: *Deleted

## 2017-10-14 ENCOUNTER — Encounter (HOSPITAL_COMMUNITY)
Admission: RE | Admit: 2017-10-14 | Discharge: 2017-10-14 | Disposition: A | Discharge status: Home / Self Care | Payer: Self-pay | Source: Ambulatory Visit | Attending: Internal Medicine | Admitting: Internal Medicine

## 2017-10-14 DIAGNOSIS — R7989 Other specified abnormal findings of blood chemistry: Secondary | ICD-10-CM

## 2017-10-14 DIAGNOSIS — N179 Acute kidney failure, unspecified: Secondary | ICD-10-CM

## 2017-10-14 NOTE — Telephone Encounter (Signed)
Pt is returning call. Cb is [phone].

## 2017-10-14 NOTE — Telephone Encounter (Signed)
She needs to stop meloxicam if she can or atleast cut it down by 50% Also refer to renal Drink 3L of wtaer daily atleast

## 2017-10-14 NOTE — Telephone Encounter (Signed)
Patient is aware of results from MR. Patient will no longer take meloxicam and will drink 3L of water daily. I have placed order for referral.

## 2017-10-15 ENCOUNTER — Other Ambulatory Visit: Payer: Self-pay | Admitting: *Deleted

## 2017-10-15 DIAGNOSIS — N179 Acute kidney failure, unspecified: Secondary | ICD-10-CM

## 2017-10-15 DIAGNOSIS — J449 Chronic obstructive pulmonary disease, unspecified: Secondary | ICD-10-CM

## 2017-10-18 ENCOUNTER — Encounter: Payer: Self-pay | Admitting: Internal Medicine

## 2017-10-18 ENCOUNTER — Other Ambulatory Visit (INDEPENDENT_AMBULATORY_CARE_PROVIDER_SITE_OTHER): Payer: Medicare HMO

## 2017-10-18 ENCOUNTER — Telehealth: Payer: Self-pay | Admitting: Internal Medicine

## 2017-10-18 DIAGNOSIS — N179 Acute kidney failure, unspecified: Secondary | ICD-10-CM

## 2017-10-18 DIAGNOSIS — J449 Chronic obstructive pulmonary disease, unspecified: Secondary | ICD-10-CM | POA: Diagnosis not present

## 2017-10-18 LAB — BASIC METABOLIC PANEL
BUN: 10 mg/dL (ref 6–23)
CALCIUM: 10.2 mg/dL (ref 8.4–10.5)
CO2: 31 meq/L (ref 19–32)
Chloride: 91 mEq/L — ABNORMAL LOW (ref 96–112)
Creatinine, Ser: 1.25 mg/dL — ABNORMAL HIGH (ref 0.40–1.20)
GFR: 55.51 mL/min — ABNORMAL LOW (ref >60.00)
GLUCOSE: 122 mg/dL — AB (ref 70–99)
POTASSIUM: 3.7 meq/L (ref 3.5–5.1)
SODIUM: 132 meq/L — AB (ref 135–145)

## 2017-10-18 MED ORDER — TIOTROPIUM BROMIDE-OLODATEROL 2.5-2.5 MCG/ACT IN AERS: 2.0000 | INHALATION_SPRAY | Freq: Every day | RESPIRATORY_TRACT | 3 refills | Status: DC

## 2017-10-18 NOTE — Telephone Encounter (Signed)
thisresult already given in phone note 10/18/2017   Recent Labs  Lab 10/18/17 1102  NA 132*  K 3.7  CL 91*  CO2 31  GLUCOSE 122*  BUN 10  CREATININE 1.25*  CALCIUM 10.2

## 2017-10-18 NOTE — Telephone Encounter (Signed)
MR can you please clarify on Spiriva dosage. Thanks

## 2017-10-18 NOTE — Telephone Encounter (Signed)
I wanted her on stiolto - if there are 2 doses of it go for higher dose   Dr. Brand Males, M.D., [facility].C.P Pulmonary and Critical Care Medicine Staff Physician, [place] Director - Interstitial Lung Disease  Program  Pulmonary [place] at [place], [place], [address]  Pager: [phone], If no answer or between  15:00h - 7:00h: call [phone] Telephone: [phone]

## 2017-10-18 NOTE — Telephone Encounter (Signed)
MR please advise 

## 2017-10-18 NOTE — Telephone Encounter (Signed)
Called and spoke with pt Friday, 10/15/17 letting her know MR wanted her to come in and have the BMET repeated due to previous elevated result.  Pt stated to me she was needing her Trelegy switched to a different inhaler due to receiving patient assistance for prescriptions through [place].  Pt states she needs the inhaler switched to either Stiolto or Spiriva.   MR, please advise which inhaler we can change pt to. Thanks!

## 2017-10-18 NOTE — Telephone Encounter (Signed)
  Let [name] know  A. Chagne trelegy to stiolto + a inhaled steroid (and this can be arnuity) B. Kidney function better with fluids and stopping meloxicam  - she does not need to see kidney doctor  - but she needs to take this issue with pcp  Leanna Battles, MD    .Results for Katie Greene, Katie Greene (MRN [id number]) as of 10/18/2017 14:41  Ref. Range 06/10/2012 12:57 03/09/2017 10:34 10/02/2017 19:12 10/11/2017 10:41 10/18/2017 11:02  Creatinine Latest Ref Range: 0.40 - 1.20 mg/dL  1.15 1.30 (H) 1.60 (H) 1.25 (H)

## 2017-10-19 ENCOUNTER — Telehealth: Payer: Self-pay

## 2017-10-19 ENCOUNTER — Encounter (HOSPITAL_COMMUNITY)
Admission: RE | Admit: 2017-10-19 | Discharge: 2017-10-19 | Disposition: A | Discharge status: Home / Self Care | Payer: Self-pay | Source: Ambulatory Visit | Attending: Internal Medicine | Admitting: Internal Medicine

## 2017-10-19 NOTE — Telephone Encounter (Signed)
Spoke with pt, she states she needs Proventil Rx and Stiolto to be sent to the financial assistance program. She states they are sending over the information for MR to fill out. I spoke with Raquel Sarna verbally and she stated she didn't receive anything. I check both fax machines and I am not able to locate any paperwork regarding Rx's.

## 2017-10-20 ENCOUNTER — Telehealth: Payer: Self-pay | Admitting: Internal Medicine

## 2017-10-20 NOTE — Telephone Encounter (Signed)
I still have not located paperwork. ATC pt, no answer. Left message for pt to call back.

## 2017-10-20 NOTE — Telephone Encounter (Signed)
Spoke with Museum/gallery conservator at [facility]  We referred the pt there for elevated creatinine  She states Dr. Louie Boston reviewed pt's records and suggest that while waiting on her appt there, she should go ahead and d/c her meloxicam   Called and spoke with the pt and notified of the above  She states she already stopped mobic  She has cancelled the appt with nephrology b/c had repeat labs on 10/18/17  She PN dated 3/18/19Brand Males, MD      10/18/17 2:41 PM  Note     Let [name] know  A. Chagne trelegy to stiolto + a inhaled steroid (and this can be arnuity) B. Kidney function better with fluids and stopping meloxicam             - she does not need to see kidney doctor             - but she needs to take this issue with pcp  Leanna Battles, MD    .Results for NICOLETTA, HUSH (MRN [id number]) as of 10/18/2017 14:41  Ref. Range 06/10/2012 12:57 03/09/2017 10:34 10/02/2017 19:12 10/11/2017 10:41 10/18/2017 11:02  Creatinine Latest Ref Range: 0.40 - 1.20 mg/dL

## 2017-10-21 ENCOUNTER — Encounter (HOSPITAL_COMMUNITY)
Admission: RE | Admit: 2017-10-21 | Discharge: 2017-10-21 | Disposition: A | Discharge status: Home / Self Care | Payer: Self-pay | Source: Ambulatory Visit | Attending: Internal Medicine | Admitting: Internal Medicine

## 2017-10-21 ENCOUNTER — Telehealth: Payer: Self-pay | Admitting: Internal Medicine

## 2017-10-21 MED ORDER — FLUTICASONE-UMECLIDIN-VILANT 100-62.5-25 MCG/INH IN AEPB: 1.0000 | INHALATION_SPRAY | Freq: Every day | RESPIRATORY_TRACT | 0 refills | Status: DC

## 2017-10-21 MED ORDER — FLUTICASONE FUROATE 100 MCG/ACT IN AEPB: 1.0000 | INHALATION_SPRAY | Freq: Every day | RESPIRATORY_TRACT | 3 refills | Status: DC

## 2017-10-21 NOTE — Telephone Encounter (Signed)
Called and spoke with pt letting her know MR changed the Trelegy inhaler from that to [employer].  Told pt a Rx of Arnuity was sent to the same pharmacy as the [place].  Pt expressed understanding. Nothing further needed at this time.

## 2017-10-21 NOTE — Addendum Note (Signed)
Addended by: Lorretta Harp on: 10/21/2017 10:34 AM   Modules accepted: Orders

## 2017-10-21 NOTE — Telephone Encounter (Addendum)
After placing a sample of Trelegy up front for pt, looked at a previous message from MR where he stated to change pt's Trelegy to [employer] [place].  Have discontinued the Trelegy and sent a script of Arnuity to correct pharmacy.  Tried to call pt to let her know this information but unable to reach her.  Left a message for pt to call us back x1.  Since I had previously closed this message out, will route it to myself so I can follow up on.

## 2017-10-21 NOTE — Telephone Encounter (Signed)
Pt made aware of previous message.  Nothing further needed at this time.

## 2017-10-21 NOTE — Telephone Encounter (Signed)
Pt's disability paperwork was faxed 10/18/17.  We also do have a sample of Trelegy that I am placing up front for pt.  Called and spoke with pt who stated pt had just recently received a phone call about her disability paperwork being received.  Stated to pt I was placing a sample up front for her to come pick up.  Pt expressed understanding. Nothing further needed at this current time.

## 2017-10-26 ENCOUNTER — Encounter (HOSPITAL_COMMUNITY)
Admission: RE | Admit: 2017-10-26 | Discharge: 2017-10-26 | Disposition: A | Discharge status: Home / Self Care | Payer: Self-pay | Source: Ambulatory Visit | Attending: Internal Medicine | Admitting: Internal Medicine

## 2017-10-27 ENCOUNTER — Telehealth: Payer: Self-pay | Admitting: Internal Medicine

## 2017-10-27 NOTE — Telephone Encounter (Signed)
Spoke with patient. She was checking on the status of her patient assistance forms. Patient stated that these forms needed to be faxed to a program called [phone]. Found forms that scanned into patient's chart.   Will re-fax forms to [employer] again. Patient verbalized understanding. Nothing else needed at time of call.

## 2017-10-28 ENCOUNTER — Encounter (HOSPITAL_COMMUNITY)
Admission: RE | Admit: 2017-10-28 | Discharge: 2017-10-28 | Disposition: A | Discharge status: Home / Self Care | Payer: Self-pay | Source: Ambulatory Visit | Attending: Internal Medicine | Admitting: Internal Medicine

## 2017-10-28 ENCOUNTER — Telehealth: Payer: Self-pay | Admitting: Internal Medicine

## 2017-10-28 NOTE — Telephone Encounter (Signed)
Called patient unable to reach left message to give us a call back.

## 2017-10-28 NOTE — Telephone Encounter (Signed)
Please see previous message.  Patient is frustrated about this and states she will not call anyone about this anymore but requests we call her.

## 2017-10-28 NOTE — Telephone Encounter (Signed)
Called and spoke with patient, advised her that the forms were faxed in on 3.27.19. Patient is aware and nothing further needed.

## 2017-10-29 ENCOUNTER — Telehealth: Payer: Self-pay | Admitting: Internal Medicine

## 2017-10-29 ENCOUNTER — Other Ambulatory Visit: Payer: Self-pay | Admitting: *Deleted

## 2017-10-29 MED ORDER — FLUTICASONE-UMECLIDIN-VILANT 100-62.5-25 MCG/INH IN AEPB: 1.0000 | INHALATION_SPRAY | Freq: Every day | RESPIRATORY_TRACT | 0 refills | Status: DC

## 2017-10-29 NOTE — Telephone Encounter (Signed)
Patient is calling back and states once she got home and figured her annual income it was different than what she stated in the office. She states it is $14,648.  Patient's CB is [phone] if need to call back.

## 2017-10-29 NOTE — Telephone Encounter (Signed)
See message from 10/29/17.

## 2017-10-29 NOTE — Progress Notes (Signed)
Pt came to the office to sign the [employer] application for her proventil and stilto meds.  After pt signed the application, she stated to me she is currently completely out of Trelegy.  Went to check to see if we had any samples of both Stiolto and Arnuity as these are the meds we are switching her to instead of the trelegy so she can be able to receive Rx assistance.  We did not have any samples of either of those meds but did have Trelegy.  Gave pt one sample of Trelegy so that way it could hopefully hold her over until she received the new inhalers we are switching her to.  Stated to pt once she receives the new inhalers of Stiolto and Arnuity for her to stop using the Trelegy and start taking those meds.  Pt expressed understanding. Nothing further needed at this time.

## 2017-10-29 NOTE — Telephone Encounter (Signed)
Called patient unable to reach, will route this to EP to follow up on.

## 2017-10-29 NOTE — Telephone Encounter (Signed)
Received a fax from [employer] for pt assistance program enrollment form.  Looking through the paperwork, there are places pt needs to sign as well as getting a current gross income from pt.  MR will also need to sign the forms before they can be mailed in as well. Will get MR to sign once he returns back to the office 11/02/17.  Spoke with pt letting her know we had received the fax and that I had places on the paperwork she needed to sign.  Pt stated to me she would come by later today to sign the paperwork.  Will leave encounter open until application has been mailed for pt.

## 2017-10-29 NOTE — Telephone Encounter (Signed)
Have changed the annual household income amount on pt's application.  Will fax once signed by MR.

## 2017-11-02 ENCOUNTER — Encounter (HOSPITAL_COMMUNITY)
Admission: RE | Admit: 2017-11-02 | Discharge: 2017-11-02 | Disposition: A | Discharge status: Home / Self Care | Payer: Self-pay | Source: Ambulatory Visit | Attending: Internal Medicine | Admitting: Internal Medicine

## 2017-11-02 DIAGNOSIS — J42 Unspecified chronic bronchitis: Secondary | ICD-10-CM | POA: Insufficient documentation

## 2017-11-03 NOTE — Telephone Encounter (Signed)
Patient's assistance forms have been signed by MR and faxed to [employer] for pt.  Called pt letting her know that paperwork has been faxed for her and stated to her to let us know if there was anything needed.  Pt expressed understanding. Nothing needed at this time.

## 2017-11-04 ENCOUNTER — Encounter (HOSPITAL_COMMUNITY)
Admission: RE | Admit: 2017-11-04 | Discharge: 2017-11-04 | Disposition: A | Discharge status: Home / Self Care | Payer: Medicare HMO | Source: Ambulatory Visit | Attending: Internal Medicine | Admitting: Internal Medicine

## 2017-11-04 DIAGNOSIS — J449 Chronic obstructive pulmonary disease, unspecified: Secondary | ICD-10-CM | POA: Diagnosis not present

## 2017-11-05 ENCOUNTER — Telehealth: Payer: Self-pay | Admitting: Internal Medicine

## 2017-11-05 NOTE — Telephone Encounter (Signed)
Forms have been placed in the mail for pt 11/05/17.  Called to let pt know this was done.  Pt expressed understanding. Nothing further needed at this time.

## 2017-11-05 NOTE — Telephone Encounter (Signed)
Called and spoke to patient. Patient reports that she talked with the patient assistance company and that they need the patient's original application for the proventil mailed to them, not faxed.  Pt verified address as: [facility] [address] [place] [address]  Patient stated Raquel Sarna has the forms from the last OV.  Will route to [place] to follow up on.

## 2017-11-09 ENCOUNTER — Encounter (HOSPITAL_COMMUNITY)
Admission: RE | Admit: 2017-11-09 | Discharge: 2017-11-09 | Disposition: A | Discharge status: Home / Self Care | Payer: Self-pay | Source: Ambulatory Visit | Attending: Internal Medicine | Admitting: Internal Medicine

## 2017-11-11 ENCOUNTER — Encounter (HOSPITAL_COMMUNITY)
Admission: RE | Admit: 2017-11-11 | Discharge: 2017-11-11 | Disposition: A | Discharge status: Home / Self Care | Payer: Self-pay | Source: Ambulatory Visit | Attending: Internal Medicine | Admitting: Internal Medicine

## 2017-11-12 ENCOUNTER — Encounter: Payer: Self-pay | Admitting: Internal Medicine

## 2017-11-12 MED ORDER — PREDNISONE 10 MG PO TABS: ORAL_TABLET | ORAL | 0 refills | Status: DC

## 2017-11-12 MED ORDER — AZITHROMYCIN 250 MG PO TABS: ORAL_TABLET | ORAL | 0 refills | Status: AC

## 2017-11-12 NOTE — Telephone Encounter (Signed)
Spoke with the pt over the phone due to the nature of this message. She is aware of Dr. Gustavus Bryant recommendations. Both prescriptions have been sent to [facility] [place]. Nothing further was needed.

## 2017-11-12 NOTE — Telephone Encounter (Signed)
Dr. Wert please advise.  Thanks! 

## 2017-11-12 NOTE — Telephone Encounter (Signed)
Best bet to have on hand for   A) purulent sputum = zpak  B)  For wheeze/cough/sob: Prednisone 10 mg take  4 each am x 2 days,   2 each am x 2 days,  1 each am x 2 days and stop   If has both problems, can take both rx at the same time

## 2017-11-16 ENCOUNTER — Encounter (HOSPITAL_COMMUNITY): Payer: Self-pay

## 2017-11-18 ENCOUNTER — Encounter (HOSPITAL_COMMUNITY)
Admission: RE | Admit: 2017-11-18 | Discharge: 2017-11-18 | Disposition: A | Discharge status: Home / Self Care | Payer: Self-pay | Source: Ambulatory Visit | Attending: Internal Medicine | Admitting: Internal Medicine

## 2017-11-23 ENCOUNTER — Encounter (HOSPITAL_COMMUNITY)
Admission: RE | Admit: 2017-11-23 | Discharge: 2017-11-23 | Disposition: A | Discharge status: Home / Self Care | Payer: Medicare HMO | Source: Ambulatory Visit | Attending: Internal Medicine | Admitting: Internal Medicine

## 2017-11-25 ENCOUNTER — Encounter (HOSPITAL_COMMUNITY)
Admission: RE | Admit: 2017-11-25 | Discharge: 2017-11-25 | Disposition: A | Discharge status: Home / Self Care | Payer: Medicare HMO | Source: Ambulatory Visit | Attending: Internal Medicine | Admitting: Internal Medicine

## 2017-11-25 NOTE — Telephone Encounter (Signed)
Per 3.27.19 phone note, this issue has been taken care of Will sign off

## 2017-11-30 ENCOUNTER — Encounter (HOSPITAL_COMMUNITY)
Admission: RE | Admit: 2017-11-30 | Discharge: 2017-11-30 | Disposition: A | Discharge status: Home / Self Care | Payer: Self-pay | Source: Ambulatory Visit | Attending: Internal Medicine | Admitting: Internal Medicine

## 2017-12-02 ENCOUNTER — Encounter (HOSPITAL_COMMUNITY)
Admission: RE | Admit: 2017-12-02 | Discharge: 2017-12-02 | Disposition: A | Discharge status: Home / Self Care | Payer: Self-pay | Source: Ambulatory Visit | Attending: Internal Medicine | Admitting: Internal Medicine

## 2017-12-02 DIAGNOSIS — J42 Unspecified chronic bronchitis: Secondary | ICD-10-CM | POA: Insufficient documentation

## 2017-12-04 DIAGNOSIS — J449 Chronic obstructive pulmonary disease, unspecified: Secondary | ICD-10-CM | POA: Diagnosis not present

## 2017-12-07 ENCOUNTER — Encounter (HOSPITAL_COMMUNITY): Payer: Self-pay

## 2017-12-09 ENCOUNTER — Encounter (HOSPITAL_COMMUNITY)
Admission: RE | Admit: 2017-12-09 | Discharge: 2017-12-09 | Disposition: A | Discharge status: Home / Self Care | Payer: Self-pay | Source: Ambulatory Visit | Attending: Internal Medicine | Admitting: Internal Medicine

## 2017-12-09 NOTE — Progress Notes (Signed)
Nutrition Note Wt Readings from Last 10 Encounters:  10/11/17 168 lb 9.6 oz (76.5 kg)  10/02/17 166 lb (75.3 kg)  06/29/17 176 lb 6.4 oz (80 kg)  03/09/17 177 lb (80.3 kg)  12/31/16 177 lb 6.4 oz (80.5 kg)  09/02/16 182 lb (82.6 kg)  05/26/16 183 lb (83 kg)  02/18/16 184 lb (83.5 kg)  12/19/15 182 lb (82.6 kg)  09/16/15 189 lb (85.7 kg)   Spoke with pt. Pt c/o slow, chronic wt loss. Pt wt is down 9 lb (5.1% decrease) over the past year and 21 lb (11.1% decrease) over the past 2 years. Pt encouraged to eat small, frequent meals and use nutrition supplements as a meal replacement when meals are not desired. Pt states she can increase her O2 if she needs to. Pt encouraged to try increasing her O2 to 4 L while eating to see if that helps aid pt's digestion/absorption of nutrients. Pt expressed understanding of the information reviewed.Continue client-centered nutrition education by RD as part of interdisciplinary care.  Monitor and evaluate progress toward nutrition goal with team.  Derek Mound, M.Ed, RD, LDN, CDE 12/09/2017 12:28 PM

## 2017-12-10 DIAGNOSIS — R630 Anorexia: Secondary | ICD-10-CM | POA: Diagnosis not present

## 2017-12-10 DIAGNOSIS — J449 Chronic obstructive pulmonary disease, unspecified: Secondary | ICD-10-CM | POA: Diagnosis not present

## 2017-12-10 DIAGNOSIS — I10 Essential (primary) hypertension: Secondary | ICD-10-CM | POA: Diagnosis not present

## 2017-12-10 DIAGNOSIS — Z6826 Body mass index (BMI) 26.0-26.9, adult: Secondary | ICD-10-CM | POA: Diagnosis not present

## 2017-12-10 DIAGNOSIS — K219 Gastro-esophageal reflux disease without esophagitis: Secondary | ICD-10-CM | POA: Diagnosis not present

## 2017-12-10 DIAGNOSIS — Z9981 Dependence on supplemental oxygen: Secondary | ICD-10-CM | POA: Diagnosis not present

## 2017-12-10 DIAGNOSIS — R634 Abnormal weight loss: Secondary | ICD-10-CM | POA: Diagnosis not present

## 2017-12-14 ENCOUNTER — Encounter (HOSPITAL_COMMUNITY)
Admission: RE | Admit: 2017-12-14 | Discharge: 2017-12-14 | Disposition: A | Discharge status: Home / Self Care | Payer: Self-pay | Source: Ambulatory Visit | Attending: Internal Medicine | Admitting: Internal Medicine

## 2017-12-14 ENCOUNTER — Encounter: Payer: Self-pay | Admitting: Internal Medicine

## 2017-12-14 NOTE — Telephone Encounter (Signed)
To: [facility]    From: NGUYEN BUTLER    Created: 12/14/2017 8:14 AM     *-*-*This message has not been handled.*-*-*  Good Morning, Hope all is well. This email is to inform Dr Chase Caller that I discontinued the Daliresp tab 500 mg due to the fact my body seems to be rejecting the medication. I had a dr's appt on Friday due to excessive weight, Diarrhea, lost of appetite, upset stomach. I stopped the medication for 4 days and I felt better,when I started taking it again they all came back. Blood was done on Friday 12/09/17 by Dr Shon Baton office.I was told all the blood work came back fine. I am suppose to have my 3 month check up in June ,but has not been scheduledyet.I will speak with Dr Chase Caller when ever I come in for that visit,but wanted him to be aware. Year to date from last year per Collie Siad NP whom I saw on Friday I have lost 20 pounds. Most of that since I started taking this medication. Please let me know when I am scheduled for June. I am eating small meals all day and drinking Ensure. Thanks, Katie Greene     MR please advise. Thanks

## 2017-12-15 NOTE — Telephone Encounter (Signed)
Vesta Mixer and sorry to hear. I agree dc daliresp. Please give her followup with me in May or June   Dr. Brand Males, M.D., [facility].C.P Pulmonary and Critical Care Medicine Staff Physician, [place] Director - Interstitial Lung Disease  Program  Pulmonary [place] at [place], [place], [address]  Pager: [phone], If no answer or between  15:00h - 7:00h: call [phone] Telephone: [phone]

## 2017-12-16 ENCOUNTER — Encounter (HOSPITAL_COMMUNITY)
Admission: RE | Admit: 2017-12-16 | Discharge: 2017-12-16 | Disposition: A | Discharge status: Home / Self Care | Payer: Medicare HMO | Source: Ambulatory Visit | Attending: Internal Medicine | Admitting: Internal Medicine

## 2017-12-21 ENCOUNTER — Encounter (HOSPITAL_COMMUNITY): Payer: Self-pay

## 2017-12-23 ENCOUNTER — Encounter (HOSPITAL_COMMUNITY): Payer: Self-pay

## 2017-12-28 ENCOUNTER — Telehealth: Payer: Self-pay | Admitting: Internal Medicine

## 2017-12-28 ENCOUNTER — Encounter (HOSPITAL_COMMUNITY): Payer: Self-pay

## 2017-12-28 MED ORDER — ALBUTEROL SULFATE (2.5 MG/3ML) 0.083% IN NEBU: 2.5000 mg | INHALATION_SOLUTION | RESPIRATORY_TRACT | 6 refills | Status: DC | PRN

## 2017-12-28 NOTE — Telephone Encounter (Signed)
Spoke with pt and advised rx sent to pharmacy. Nothing further is needed.   

## 2017-12-29 ENCOUNTER — Encounter: Payer: Self-pay | Admitting: Physician Assistant

## 2017-12-29 ENCOUNTER — Ambulatory Visit: Payer: Medicare HMO | Admitting: Physician Assistant

## 2017-12-29 VITALS — BP 100/64 | HR 112 | Ht 66.0 in | Wt 157.0 lb

## 2017-12-29 DIAGNOSIS — R634 Abnormal weight loss: Secondary | ICD-10-CM | POA: Diagnosis not present

## 2017-12-29 DIAGNOSIS — K219 Gastro-esophageal reflux disease without esophagitis: Secondary | ICD-10-CM

## 2017-12-29 DIAGNOSIS — K5900 Constipation, unspecified: Secondary | ICD-10-CM | POA: Diagnosis not present

## 2017-12-29 NOTE — Progress Notes (Signed)
Chief Complaint: weight loss and decrease in appetite  HPI:    [name] is a 62 year old female known to Dr. Havery Moros, with a past medical history of COPD on oxygen, reflux and others listed below,  who was referred to me by Leanna Battles, MD for a complaint of weight loss and decrease in appetite .      OV 05/20/15 for positive Hemoccult stool sample. At that time described family history of colon cancer in her sister at the age of 60. Patient had recently had a colonoscopy in 2012 with no adenomatous polyps. Scheduled for colonoscopy.    08/27/15 colonoscopy with 3 colon polyps removed, tortuous colon, lipoma of the ascending colon more, normal ileum and internal hemorrhoids, thought cause of positive FOBT. Pathology showed adenomas. Repeat was recommended in 3 years.    12/10/17 OV with PCP for weight loss and decreased appetite. Described unexplained weight loss since March, which started fluctuating last year, then patient started a pulmonary medication in March and caused appetite loss, nausea and diarrhea and patient had to force herself to eat. She stopped taking it but her appetite was not returning. Described 20 pound weight loss and 15 months. Seeing nutritionist and told to eat small meals 4-5 times a day +1 Ensure. Labs ordered CBC, CMP and TSH. Referred back to Dr. Havery Moros for consideration of CT of the abdomen.    12/10/17 CMP normal, CBC normal.      Today, explains that she lost her appetite about a year ago and started losing weight, this was initially related to a medication that she was on. This medication has since been stopped and she has continued with a decrease in appetite. Her weight has dropped about 20 pounds over the past year. Currently, she has been seeing a nutritionist and eating 4-5 small meals per day as well as supplementing with at least 2-3 Ensure shakes. Some food is enticing to her and occasionally she will enjoy what she is eating. Does admit that being  on Prednisone makes her eat a lot.    Chronic reflux controlled with Prilosec 20 mg daily.    Increased constipation recently with a bowel movement maybe once a week. Was on MiraLAX daily but this was not helping. Is not on anything now.    Social history positive for going through a divorce, she was married for 20 years.    Denies fever, chills, abdominal pain, blood in her stool, melena, nausea, vomiting, dysphagia or symptoms that awaken her from sleep.  Past Medical History:  Diagnosis Date  . Anxiety   . Arthritis   . Chest pain    Improved  . 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 Depression   . Fatigue   . GERD (gastroesophageal reflux disease)   . Hemorrhoids   . Hyperlipidemia   . Hypertension   . On home oxygen therapy    oxygen therapy 2-3 L/m nasally 24/7  . Palpitations     Past Surgical History:  Procedure Laterality Date  . CHOLECYSTECTOMY     laparosopic attempted, then had to open  . COLONOSCOPY     x2 with polyps removed  . COLONOSCOPY N/A 08/27/2015   Procedure: COLONOSCOPY;  Surgeon: Manus Gunning, MD;  Location: [facility];  Service: Gastroenterology;  Laterality: N/A;  . oopherectomy     1 ovary removed only  . POLYPECTOMY    . SALPINGECTOMY     1 fallopian tube removed  Current Outpatient Medications  Medication Sig Dispense Refill  . Acetylcysteine 600 MG CAPS Take 1 capsule by mouth 2 (two) times daily.    Marland Kitchen albuterol (PROVENTIL) (2.5 MG/3ML) 0.083% nebulizer solution Take 3 mLs (2.5 mg total) by nebulization every 4 (four) hours as needed for wheezing or shortness of breath. Dx code j44.9 125 mL 6  . albuterol (VENTOLIN HFA) 108 (90 Base) MCG/ACT inhaler Inhale 2 puffs into the lungs every 6 (six) hours as needed for wheezing or shortness of breath. 3 Inhaler 3  . ALPRAZolam (XANAX) 0.5 MG tablet Take 0.5 mg by mouth 3 (three) times daily as needed for anxiety.     Marland Kitchen aspirin EC 81 MG tablet Take 81  mg by mouth every morning.     Marland Kitchen buPROPion (ZYBAN) 150 MG 12 hr tablet Take 150 mg by mouth daily after breakfast.    . cetirizine (ZYRTEC) 10 MG tablet Take 10 mg by mouth at bedtime.    . cholecalciferol (VITAMIN D) 1000 UNITS tablet Take 1,000 Units by mouth daily.    . clotrimazole (MYCELEX) 10 MG troche Take 1 tablet (10 mg total) by mouth 5 (five) times daily. 70 tablet 0  . diltiazem (CARDIZEM CD) 180 MG 24 hr capsule Take 180 mg by mouth every morning.     . ferrous sulfate 325 (65 FE) MG tablet Take 325 mg by mouth daily with breakfast.    . fluticasone (FLONASE) 50 MCG/ACT nasal spray Place 2 sprays into both nostrils daily. 16 g 5  . Fluticasone Furoate (ARNUITY ELLIPTA) 100 MCG/ACT AEPB Inhale 1 puff into the lungs daily. 30 each 3  . Fluticasone-Umeclidin-Vilant (TRELEGY ELLIPTA) 100-62.5-25 MCG/INH AEPB Inhale 1 puff into the lungs daily. 1 each 0  . hydrochlorothiazide (HYDRODIURIL) 25 MG tablet Take 25 mg by mouth every morning.     . meloxicam (MOBIC) 15 MG tablet Take 15 mg by mouth daily.    . metoprolol succinate (TOPROL-XL) 50 MG 24 hr tablet Take 50 mg by mouth every morning. Take with or immediately following a meal.    . nystatin (MYCOSTATIN) 100000 UNIT/ML suspension Take 5 mLs (500,000 Units total) by mouth 4 (four) times daily. 120 mL 0  . omeprazole (PRILOSEC) 20 MG capsule Take 20 mg by mouth daily.     . ondansetron (ZOFRAN ODT) 4 MG disintegrating tablet Take 1 tablet (4 mg total) by mouth every 8 (eight) hours as needed for nausea or vomiting. (Patient not taking: Reported on 10/11/2017) 10 tablet 0  . OXYGEN Inhale 2-3 L into the lungs daily.    . polyethylene glycol (MIRALAX / GLYCOLAX) packet Take 17 g by mouth daily.    . pravastatin (PRAVACHOL) 80 MG tablet Take 80 mg by mouth every morning.     . predniSONE (DELTASONE) 10 MG tablet Take 4 tablets for 2 days, 2 tablets for 2 days, 1 tablet for 2 days then stop 14 tablet 0  . roflumilast (DALIRESP) 500 MCG TABS  tablet Take 1 tablet (500 mcg total) by mouth daily. 30 tablet 3  . spironolactone (ALDACTONE) 50 MG tablet Take 25 mg by mouth every morning.     . temazepam (RESTORIL) 30 MG capsule Take 30 mg by mouth at bedtime as needed for sleep.     . Tiotropium Bromide-Olodaterol (STIOLTO RESPIMAT) 2.5-2.5 MCG/ACT AERS Inhale 2 puffs into the lungs daily. 1 Inhaler 3   No current facility-administered medications for this visit.     Allergies as of 12/29/2017  . (  No Known Allergies)    Family History  Problem Relation Age of Onset  . Heart attack Mother   . Pancreatic cancer Mother   . Prostate cancer Father   . Hypertension Father   . Lung cancer Father   . Throat cancer Brother        Throat Cancer  . Colon cancer Sister     Social History   Socioeconomic History  . Marital status: Married    Spouse name: Not on file  . Number of children: 3  . Years of education: Not on file  . Highest education level: Not on file  Occupational History  . Occupation: Retired     Comment: Pharmacologist  . Financial resource strain: Not on file  . Food insecurity:    Worry: Not on file    Inability: Not on file  . Transportation needs:    Medical: Not on file    Non-medical: Not on file  Tobacco Use  . Smoking status: Former Smoker    Packs/day: 0.50    Years: 25.00    Pack years: 12.50    Types: Cigarettes    Last attempt to quit: 03/02/2012    Years since quitting: 5.8  . Smokeless tobacco: Never Used  . Tobacco comment: pt states she smoked some and stopped again 8-13.  Substance and Sexual Activity  . Alcohol use: No  . Drug use: No  . Sexual activity: Not on file  Lifestyle  . Physical activity:    Days per week: Not on file    Minutes per session: Not on file  . Stress: Not on file  Relationships  . Social connections:    Talks on phone: Not on file    Gets together: Not on file    Attends religious service: Not on file    Active member of club or organization:  Not on file    Attends meetings of clubs or organizations: Not on file    Relationship status: Not on file  . Intimate partner violence:    Fear of current or ex partner: Not on file    Emotionally abused: Not on file    Physically abused: Not on file    Forced sexual activity: Not on file  Other Topics Concern  . Not on file  Social History Narrative  . Not on file    Review of Systems:    Constitutional: No fever or chills Cardiovascular: No chest pain Respiratory: +chronic SOB on O2 Gastrointestinal: See HPI and otherwise negative   Physical Exam:  Vital signs: BP 100/64   Pulse (!) 112   Ht 5\' 6"  (1.676 m)   Wt 157 lb (71.2 kg)   SpO2 99%   BMI 25.34 kg/m   Constitutional:   Pleasant AA female appears to be in NAD, Well developed, Well nourished, alert and cooperative Respiratory: Respirations even and unlabored. Lungs clear to auscultation bilaterally.   No wheezes, crackles, or rhonchi. On 02 via [place] Cardiovascular: Normal S1, S2. No MRG. Regular rate and rhythm. No peripheral edema, cyanosis or pallor.  Gastrointestinal:  Soft, nondistended, nontender. No rebound or guarding. Normal bowel sounds. No appreciable masses or hepatomegaly. Rectal:  Not performed.  Psychiatric: Demonstrates good judgement and reason without abnormal affect or behaviors.  See HPI for recent labs.  Assessment: 1. Weight loss: about 20 pounds in the past year, consider relation to decrease in appetite due to medication patient was on previously versus malignancy versus  2. GERD: controlled on Omeprazole 20 mg daily 3. Constipation: likely due to decrease in appetite and decreased food intake  Plan: 1. Discussed that weight loss is likely related to decrease in appetite which sounds as though it came from a medication she was on for a period of time. Labs including CBC, CMP and recent TSH were normal. Did have a recent colonoscopy in 2017, repeat recommended in 2020. At this time due to  patient's increased risk of anesthesia with chronic O2 use, recommend starting with a CT abdomen pelvis with contrast for further evaluation of weight loss. 2. Ordered CT abdomen and pelvis with contrast 3. Pending results of above, could consider EGD as further step in workup. Would recommend she discuss this with Dr. Havery Moros. 4. Patient scheduled with Dr. Havery Moros at his next available appointment. The patient will call if she has problems in the interim. 5. Encouraged patient to continue following with nutritionist 6. Recommend patient restart MiraLAX. Discussed she can use this up to 4 times daily. This should be titrated to a soft solid bowel movement.  Ellouise Newer, PA-C [facility] 12/29/2017, 9:32 AM  Cc: Leanna Battles, MD

## 2017-12-29 NOTE — Patient Instructions (Addendum)
If you are age 64 or younger, your body mass index should be between 19-25. Your Body mass index is 25.34 kg/m. If this is out of the aformentioned range listed, please consider follow up with your Primary Care Provider.   Take Miralax or the generic polyethlene glycol up to 4 doses a day. One dose is 17 grams in 8 oz of water or juice.  Continue 6-8 glasses of water daily.    You have been scheduled for a CT scan of the abdomen and pelvis at [facility] ([address]---this is in the same building as [facility]).   You are scheduled on Friday 6-7 at 2:00 PM You should arrive 15 minutes prior to your appointment time for registration. Please follow the written instructions below on the day of your exam:  WARNING: IF YOU ARE ALLERGIC TO IODINE/X-RAY DYE, PLEASE NOTIFY RADIOLOGY IMMEDIATELY AT [phone]! YOU WILL BE GIVEN A 13 HOUR PREMEDICATION PREP.  1) Do not eat  anything after 10:00 am(4 hours prior to your test) 2) You have been given 2 bottles of oral contrast to drink. The solution may taste better if refrigerated, but do NOT add ice or any other liquid to this solution. Shake well before drinking.    Drink 1 bottle of contrast @ 12:00 Noon (2 hours prior to your exam)  Drink 1 bottle of contrast @ 1:00 PM (1 hour prior to your exam)  You may take any medications as prescribed with a small amount of water except for the following: Metformin, Glucophage, Glucovance, Avandamet, Riomet, Fortamet, Actoplus Met, Janumet, Glumetza or Metaglip. The above medications must be held the day of the exam AND 48 hours after the exam.  The purpose of you drinking the oral contrast is to aid in the visualization of your intestinal tract. The contrast solution may cause some diarrhea. Before your exam is started, you will be given a small amount of fluid to drink. Depending on your individual set of symptoms, you may also receive an intravenous injection of x-ray contrast/dye.  Plan on being at [facility] for 30 minutes or long, depending on the type of exam you are having performed.  If you have any questions regarding your exam or if you need to reschedule, you may call the CT department at [phone] between the hours of 8:00 am and 5:00 pm, Monday-Friday.  ________________________________________________________________________

## 2017-12-30 ENCOUNTER — Emergency Department (HOSPITAL_COMMUNITY): Payer: Medicare HMO

## 2017-12-30 ENCOUNTER — Other Ambulatory Visit: Payer: Self-pay

## 2017-12-30 ENCOUNTER — Encounter (HOSPITAL_COMMUNITY): Payer: Self-pay

## 2017-12-30 ENCOUNTER — Emergency Department (HOSPITAL_COMMUNITY)
Admission: EM | Admit: 2017-12-30 | Discharge: 2017-12-30 | Disposition: A | Discharge status: Home / Self Care | Payer: Medicare HMO | Attending: Emergency Medicine | Admitting: Emergency Medicine

## 2017-12-30 ENCOUNTER — Telehealth (HOSPITAL_COMMUNITY): Payer: Self-pay

## 2017-12-30 ENCOUNTER — Encounter (HOSPITAL_COMMUNITY): Payer: Self-pay | Admitting: Emergency Medicine

## 2017-12-30 DIAGNOSIS — M545 Low back pain, unspecified: Secondary | ICD-10-CM

## 2017-12-30 DIAGNOSIS — Z7982 Long term (current) use of aspirin: Secondary | ICD-10-CM | POA: Diagnosis not present

## 2017-12-30 DIAGNOSIS — Z79899 Other long term (current) drug therapy: Secondary | ICD-10-CM | POA: Diagnosis not present

## 2017-12-30 DIAGNOSIS — E785 Hyperlipidemia, unspecified: Secondary | ICD-10-CM | POA: Insufficient documentation

## 2017-12-30 DIAGNOSIS — R1084 Generalized abdominal pain: Secondary | ICD-10-CM | POA: Insufficient documentation

## 2017-12-30 DIAGNOSIS — I10 Essential (primary) hypertension: Secondary | ICD-10-CM | POA: Diagnosis not present

## 2017-12-30 DIAGNOSIS — J449 Chronic obstructive pulmonary disease, unspecified: Secondary | ICD-10-CM | POA: Diagnosis not present

## 2017-12-30 DIAGNOSIS — Z87891 Personal history of nicotine dependence: Secondary | ICD-10-CM | POA: Insufficient documentation

## 2017-12-30 DIAGNOSIS — R109 Unspecified abdominal pain: Secondary | ICD-10-CM | POA: Diagnosis not present

## 2017-12-30 DIAGNOSIS — M25551 Pain in right hip: Secondary | ICD-10-CM | POA: Diagnosis not present

## 2017-12-30 DIAGNOSIS — J9811 Atelectasis: Secondary | ICD-10-CM | POA: Diagnosis not present

## 2017-12-30 LAB — URINALYSIS, ROUTINE W REFLEX MICROSCOPIC
Bilirubin Urine: NEGATIVE
Glucose, UA: NEGATIVE mg/dL
HGB URINE DIPSTICK: NEGATIVE
Ketones, ur: NEGATIVE mg/dL
Nitrite: NEGATIVE
PROTEIN: NEGATIVE mg/dL
SPECIFIC GRAVITY, URINE: 1.012 (ref 1.005–1.030)
pH: 6 (ref 5.0–8.0)

## 2017-12-30 LAB — I-STAT CG4 LACTIC ACID, ED: Lactic Acid, Venous: 1.08 mmol/L (ref 0.5–1.9)

## 2017-12-30 MED ORDER — HYDROCODONE-ACETAMINOPHEN 5-325 MG PO TABS: 1.0000 | ORAL_TABLET | Freq: Four times a day (QID) | ORAL | 0 refills | Status: DC | PRN

## 2017-12-30 MED ORDER — FENTANYL CITRATE (PF) 100 MCG/2ML IJ SOLN
50.0000 ug | Freq: Once | INTRAMUSCULAR | Status: AC
  Administered 2017-12-30: 50 ug via INTRAVENOUS
  Filled 2017-12-30: qty 2

## 2017-12-30 MED ORDER — FENTANYL CITRATE (PF) 100 MCG/2ML IJ SOLN: 25.0000 ug | Freq: Once | INTRAMUSCULAR | Status: DC

## 2017-12-30 NOTE — ED Provider Notes (Signed)
Medical screening examination/treatment/procedure(s) were conducted as a shared visit with non-physician practitioner(s) and myself.  I personally evaluated the patient during the encounter.  None 62 year old female presents with acute onset of right sided pinpoint flank pain characterizes sharp and nonradiating and not associated with dysuria or hematuria.  Pain better with remaining still.  On exam she has been point tender at her right CVA.  Urinalysis and CT scan pending   Lacretia Leigh, MD 12/30/17 1032

## 2017-12-30 NOTE — Telephone Encounter (Signed)
Patient called and stated she will not be attending class today as she is currently admitted to [facility] due to back pain.

## 2017-12-30 NOTE — Progress Notes (Signed)
Agree with assessment and plan as outlined. Recent chest imaging without clear cause for weight loss in light of tobacco history. Agree with CT abdomen / pelvis initially. If loss of appetite persists would consider adding Remeron to help stimulate appetite.

## 2017-12-30 NOTE — Discharge Instructions (Addendum)
Medications: Norco  Treatment: Take 1-2 Norco every 6 hours as needed for severe pain.  Use ice and heat alternating 20 minutes on, 20 minutes off.  Attempt the exercises and stretches daily as tolerated.   Follow-up: Please follow-up with your doctor early next week for recheck.  Please return to emergency department if you develop any new or worsening symptoms.

## 2017-12-30 NOTE — ED Provider Notes (Signed)
[facility] DEPT Provider Note   CSN: [id number] Arrival date & time: 12/30/17  0703     History   Chief Complaint Chief Complaint  Patient presents with  . Flank Pain    HPI Katie Greene is a 62 y.o. female with history of hypertension, hyperlipidemia, COPD (oxygen dependent) who presents with right low back pain that began last evening.  Patient describes her pain is sharp and worse with movement.  Patient denies any injury, however states she was on the computer a lot yesterday.  She did not take any medications prior to arrival.  She denies any urinary symptoms,  abdominal pain, chest pain, shortness of breath (from baseline), nausea, vomiting, diarrhea, constipation.  She has history of kidney stones.  She reports this is never happened before.  She denies any fevers, saddle anesthesia, loss of bowel or bladder control, procedure to back.  HPI  Past Medical History:  Diagnosis Date  . Anxiety   . Arthritis   . Chest pain    Improved  . 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 Depression   . Fatigue   . GERD (gastroesophageal reflux disease)   . Hemorrhoids   . Hyperlipidemia   . Hypertension   . On home oxygen therapy    oxygen therapy 2-3 L/m nasally 24/7  . Palpitations     Patient Active Problem List   Diagnosis Date Noted  . Blood in stool   . History of colonic polyps   . Dyspnea and respiratory abnormality 12/12/2014  . Patient in clinical research study 08/23/2014  . COPD, severe ([place]) 08/23/2014  . Research exam 01/04/2014  . Leg cramps 09/28/2013  . Chronic respiratory failure ([place]) 09/28/2013  . Postnasal drip 12/09/2012  . Hemoptysis 09/22/2012  . COPD exacerbation ([place]) 09/22/2012  . History of smoking 09/22/2012  . Smoking history 06/29/2012  . Cancer screening 06/29/2012  . COPD (chronic obstructive pulmonary disease) ([place]) 06/12/2012    Past Surgical History:  Procedure  Laterality Date  . CHOLECYSTECTOMY     laparosopic attempted, then had to open  . COLONOSCOPY     x2 with polyps removed  . COLONOSCOPY N/A 08/27/2015   Procedure: COLONOSCOPY;  Surgeon: Manus Gunning, MD;  Location: [facility];  Service: Gastroenterology;  Laterality: N/A;  . oopherectomy     1 ovary removed only  . POLYPECTOMY    . SALPINGECTOMY     1 fallopian tube removed     OB History   None      Home Medications    Prior to Admission medications   Medication Sig Start Date End Date Taking? Authorizing Provider  Acetylcysteine 600 MG CAPS Take 1 capsule by mouth 2 (two) times daily.   Yes [provider]  albuterol (PROVENTIL) (2.5 MG/3ML) 0.083% nebulizer solution Take 3 mLs (2.5 mg total) by nebulization every 4 (four) hours as needed for wheezing or shortness of breath. Dx code j44.9 12/28/17  Yes Brand Males, MD  ALPRAZolam Duanne Moron) 0.5 MG tablet Take 0.5 mg by mouth 3 (three) times daily as needed for anxiety.  05/09/15  Yes [provider]  aspirin EC 81 MG tablet Take 81 mg by mouth every morning.    Yes [provider]  buPROPion (ZYBAN) 150 MG 12 hr tablet Take 150 mg by mouth daily after breakfast.   Yes [provider]  cetirizine (ZYRTEC) 10 MG tablet Take 10 mg by mouth at  bedtime.   Yes [provider]  cholecalciferol (VITAMIN D) 1000 UNITS tablet Take 1,000 Units by mouth daily.   Yes [provider]  clotrimazole (MYCELEX) 10 MG troche Take 1 tablet (10 mg total) by mouth 5 (five) times daily. 10/11/17  Yes Brand Males, MD  diltiazem (CARDIZEM CD) 180 MG 24 hr capsule Take 180 mg by mouth every morning.    Yes [provider]  ferrous sulfate 325 (65 FE) MG tablet Take 325 mg by mouth daily with breakfast.   Yes [provider]  fluticasone (FLONASE) 50 MCG/ACT nasal spray Place 2 sprays into both nostrils daily. 03/23/17  Yes Brand Males, MD  Fluticasone Furoate  (ARNUITY ELLIPTA) 100 MCG/ACT AEPB Inhale 1 puff into the lungs daily. 10/21/17  Yes Brand Males, MD  hydrochlorothiazide (HYDRODIURIL) 25 MG tablet Take 25 mg by mouth every morning.    Yes [provider]  metoprolol succinate (TOPROL-XL) 50 MG 24 hr tablet Take 50 mg by mouth every morning. Take with or immediately following a meal.   Yes [provider]  nystatin (MYCOSTATIN) 100000 UNIT/ML suspension Take 5 mLs (500,000 Units total) by mouth 4 (four) times daily. 10/11/17  Yes Brand Males, MD  omeprazole (PRILOSEC) 20 MG capsule Take 20 mg by mouth daily.  05/08/15  Yes [provider]  OXYGEN Inhale 2-3 L into the lungs daily.   Yes [provider]  pravastatin (PRAVACHOL) 80 MG tablet Take 80 mg by mouth every morning.    Yes [provider]  spironolactone (ALDACTONE) 50 MG tablet Take 25 mg by mouth every morning.    Yes [provider]  temazepam (RESTORIL) 30 MG capsule Take 30 mg by mouth at bedtime as needed for sleep.  05/07/15  Yes [provider]  Tiotropium Bromide-Olodaterol (STIOLTO RESPIMAT) 2.5-2.5 MCG/ACT AERS Inhale 2 puffs into the lungs daily. 10/18/17  Yes Brand Males, MD  Fluticasone-Umeclidin-Vilant (TRELEGY ELLIPTA) 100-62.5-25 MCG/INH AEPB Inhale 1 puff into the lungs daily. Patient not taking: Reported on 12/30/2017 10/29/17   Brand Males, MD  HYDROcodone-acetaminophen (NORCO/VICODIN) 5-325 MG tablet Take 1-2 tablets by mouth every 6 (six) hours as needed. 12/30/17   Frederica Kuster, PA-C    Family History Family History  Problem Relation Age of Onset  . Heart attack Mother   . Pancreatic cancer Mother   . Prostate cancer Father   . Hypertension Father   . Throat cancer Father   . Throat cancer Brother   . Colon cancer Sister   . Brain cancer Sister   . Stomach cancer Neg Hx     Social History Social History   Tobacco Use  . Smoking status: Former Smoker    Packs/day: 0.50      Years: 25.00    Pack years: 12.50    Types: Cigarettes    Last attempt to quit: 03/02/2012    Years since quitting: 5.8  . Smokeless tobacco: Never Used  . Tobacco comment: pt states she smoked some and stopped again 8-13.  Substance Use Topics  . Alcohol use: No  . Drug use: No     Allergies   Patient has no known allergies.   Review of Systems Review of Systems  Constitutional: Negative for chills and fever.  HENT: Negative for facial swelling and sore throat.   Respiratory: Negative for shortness of breath.   Cardiovascular: Negative for chest pain.  Gastrointestinal: Negative for abdominal pain, nausea and vomiting.  Genitourinary: Negative for dysuria.  Musculoskeletal: Positive for back pain.  Skin: Negative for rash and wound.  Neurological: Negative for headaches.  Psychiatric/Behavioral: The patient is not nervous/anxious.      Physical Exam Updated Vital Signs BP 122/76   Pulse 90   Temp 98 F (36.7 C) (Oral)   Resp 18   SpO2 100%   Physical Exam  Constitutional: She appears well-developed and well-nourished. No distress.  HENT:  Head: Normocephalic and atraumatic.  Mouth/Throat: Oropharynx is clear and moist. No oropharyngeal exudate.  Eyes: Pupils are equal, round, and reactive to light. Conjunctivae are normal. Right eye exhibits no discharge. Left eye exhibits no discharge. No scleral icterus.  Neck: Normal range of motion. Neck supple. No thyromegaly present.  Cardiovascular: Normal rate, regular rhythm, normal heart sounds and intact distal pulses. Exam reveals no gallop and no friction rub.  No murmur heard. Pulmonary/Chest: Effort normal and breath sounds normal. No stridor. No respiratory distress. She has no wheezes. She has no rales.  Abdominal: Soft. Bowel sounds are normal. She exhibits no distension. There is no tenderness. There is no rebound and no guarding.  Musculoskeletal: She exhibits no edema.       Back:  No midline cervical,  thoracic, or lumbar tenderness  Lymphadenopathy:    She has no cervical adenopathy.  Neurological: She is alert. Coordination normal.  Pain worse with movement of the lower extremities, however 5/5 strength to bilateral lower extremities, sensation intact  Skin: Skin is warm and dry. No rash noted. She is not diaphoretic. No pallor.  Psychiatric: She has a normal mood and affect.  Nursing note and vitals reviewed.    ED Treatments / Results  Labs (all labs ordered are listed, but only abnormal results are displayed) Labs Reviewed  URINALYSIS, ROUTINE W REFLEX MICROSCOPIC - Abnormal; Notable for the following components:      Result Value   Leukocytes, UA TRACE (*)    Bacteria, UA RARE (*)    All other components within normal limits  I-STAT CG4 LACTIC ACID, ED    EKG None  Radiology Dg Chest 2 View  Result Date: 12/30/2017 CLINICAL DATA:  Cyst or Nissen breath, right flank pain, low back pain EXAM: CHEST - 2 VIEW COMPARISON:  CT 05/25/2017.  Chest x-ray 03/09/2017. FINDINGS: Eventration of the left hemidiaphragm. Minimal left base atelectasis. Right lung clear. No effusions. Heart is normal size. No acute bony abnormality. IMPRESSION: No active cardiopulmonary disease. Electronically Signed   By: Rolm Baptise M.D.   On: 12/30/2017 08:43   Dg Lumbar Spine Complete  Result Date: 12/30/2017 CLINICAL DATA:  Low back pain, right hip pain EXAM: LUMBAR SPINE - COMPLETE 4+ VIEW COMPARISON:  CT 12/03/2010 FINDINGS: Diffuse degenerative facet disease throughout the lumbar spine, most pronounced from L3-4 through L5-S1. Normal alignment. Disc spaces maintained. No fracture. SI joints are symmetric and unremarkable. 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 No acute bony abnormality. Electronically Signed   By: Rolm Baptise M.D.   On: 12/30/2017 08:44   Ct Renal Stone Study  Result Date: 12/30/2017 CLINICAL DATA:  Right-sided flank pain over the last 12 hours EXAM: CT  ABDOMEN AND PELVIS WITHOUT CONTRAST TECHNIQUE: Multidetector CT imaging of the abdomen and pelvis was performed following the standard protocol without IV contrast. COMPARISON:  Lumbar spine films of 12/30/2016 FINDINGS: Lower chest: The lung bases are clear. A tiny pleural-based nodule is present in the right middle lobe on image 4 measuring 3 mm in diameter. No additional pulmonary nodule  is seen. The heart is within normal limits in size. Coronary artery calcifications are present. Hepatobiliary: The liver is unremarkable in the unenhanced state. Surgical clips are present from prior cholecystectomy. Pancreas: Common bile duct is prominent most likely normal post cholecystectomy. The pancreas is normal in size and the pancreatic duct is not dilated. Spleen: The spleen is unremarkable. Adrenals/Urinary Tract: The adrenal glands appear normal. No renal calculi are seen and there is no evidence of hydronephrosis. There is and exophytic cyst in the left mid kidney laterally of 1.5 cm with attenuation of -1 HU. The ureters appear normal in caliber to the bladder. The urinary bladder is not well distended but no abnormality is seen. Stomach/Bowel: The stomach is not well distended but no abnormality is seen. No small bowel distention is noted. The colon is very elongated and tortuous. The terminal ileum is unremarkable. The appendix is not definitely seen but no inflammatory process is noted. Vascular/Lymphatic: The abdominal aorta is normal in caliber with moderate abdominal aortic atherosclerosis present. No adenopathy is seen. Reproductive: The uterus is normal in size. No adnexal lesion is seen with possible small follicles right greater than left. No free fluid is noted within the pelvis. Other: No abdominal wall hernia is noted. Musculoskeletal: The lumbar vertebrae are normal alignment with normal disc spaces. There are degenerative changes throughout the facet joints of the mid to lower lumbar spine. No  compression deformity is seen. There are degenerative changes in the SI joints right greater than left. IMPRESSION: 1. No explanation for the patient's symptoms is seen. No renal or ureteral calculi are noted. 2. The terminal ileum is unremarkable. The appendix is not definitely seen but no inflammatory process is noted. 3. Elongated and tortuous colon. 4. Moderate abdominal aortic atherosclerosis. Electronically Signed   By: Ivar Drape M.D.   On: 12/30/2017 12:17   Dg Hip Unilat W Or Wo Pelvis 2-3 Views Right  Result Date: 12/30/2017 CLINICAL DATA:  Shortness of breath, right flank pain, right hip pain EXAM: DG HIP (WITH OR WITHOUT PELVIS) 2-3V RIGHT COMPARISON:  None. FINDINGS: slight joint space narrowing within the left hip. SI joints are symmetric and unremarkable. Right hip joint is maintained. No acute bony abnormality. Specifically, no fracture, subluxation, or dislocation. IMPRESSION: No acute bony abnormality. Electronically Signed   By: Rolm Baptise M.D.   On: 12/30/2017 08:45    Procedures Procedures (including critical care time)  Medications Ordered in ED Medications  fentaNYL (SUBLIMAZE) injection 25 mcg (25 mcg Intravenous Not Given 12/30/17 1255)  fentaNYL (SUBLIMAZE) injection 50 mcg (50 mcg Intravenous Given 12/30/17 0804)     Initial Impression / Assessment and Plan / ED Course  I have reviewed the triage vital signs and the nursing notes.  Pertinent labs & imaging results that were available during my care of the patient were reviewed by me and considered in my medical decision making (see chart for details).     Patient presenting with suspected musculoskeletal back pain.  UA shows trace leukocytes, 6-10 WBCs, rare bacteria.  Patient has no urinary symptoms.  Will not treat at this time.  CT renal stone study shows no explanation for the patient's symptoms, no renal or ureteral calculi;  elongated and tortuous colon.  Patient has no abdominal pain and no fever.  Will  discharge home with treatment of her suicidal back pain with Norco.  I reviewed the [place] narcotic database and found no discrepancies.  Supportive treatment discussed including stretching, ice, heat.  Follow-up  to PCP in 3 to 4 days.  Return precautions discussed.  Patient understands and agrees with plan. Patient vitals stable throughout ED course and discharged in satisfactory condition.  Final Clinical Impressions(s) / ED Diagnoses   Final diagnoses:  Acute right-sided low back pain without sciatica    ED Discharge Orders        Ordered    HYDROcodone-acetaminophen (NORCO/VICODIN) 5-325 MG tablet  Every 6 hours PRN     12/30/17 1252      Frederica Kuster, PA-C 12/30/17 1317  Lacretia Leigh, MD 12/31/17 1728

## 2017-12-30 NOTE — ED Triage Notes (Signed)
Pt complaint of right flank pain onset midnight; denies GU symptoms.

## 2018-01-04 ENCOUNTER — Encounter (HOSPITAL_COMMUNITY): Payer: Self-pay

## 2018-01-04 DIAGNOSIS — J42 Unspecified chronic bronchitis: Secondary | ICD-10-CM | POA: Insufficient documentation

## 2018-01-04 DIAGNOSIS — J449 Chronic obstructive pulmonary disease, unspecified: Secondary | ICD-10-CM | POA: Diagnosis not present

## 2018-01-06 ENCOUNTER — Encounter (HOSPITAL_COMMUNITY): Payer: Self-pay

## 2018-01-07 ENCOUNTER — Ambulatory Visit (INDEPENDENT_AMBULATORY_CARE_PROVIDER_SITE_OTHER)
Admission: RE | Admit: 2018-01-07 | Discharge: 2018-01-07 | Disposition: A | Discharge status: Home / Self Care | Payer: Medicare HMO | Source: Ambulatory Visit | Attending: Physician Assistant | Admitting: Physician Assistant

## 2018-01-07 DIAGNOSIS — R634 Abnormal weight loss: Secondary | ICD-10-CM | POA: Diagnosis not present

## 2018-01-07 MED ORDER — IOPAMIDOL (ISOVUE-300) INJECTION 61%
100.0000 mL | Freq: Once | INTRAVENOUS | Status: AC | PRN
  Administered 2018-01-07: 100 mL via INTRAVENOUS

## 2018-01-10 ENCOUNTER — Ambulatory Visit: Payer: Medicare HMO | Admitting: Internal Medicine

## 2018-01-10 ENCOUNTER — Encounter: Payer: Self-pay | Admitting: Internal Medicine

## 2018-01-10 NOTE — Telephone Encounter (Signed)
MR please advise on below patient's email. Thanks!  ----- Message from [place], Generic sent at 01/10/2018 12:49 PM EDT -----    Good afternoon,  I have an appt scheduled on 01/26/18 @10 :30 am. I was out on Friday in the rain because I had a ct scan on my stomach. On Saturday morning I felt congested and I have a cough, mucus with just a little yellowish, in color, just a little whizzing . My budget for this month does not include an extra dr's visit for this month. Can you see if Dr Chase Caller can send me in some meds to head this off before it gets to be something a lot worse until my regular visit on the 26th. [facility] is now out of business so anything you send please send it to [facility] located on [address] [place] [phone]. Will see you on the 26th    Thank you,  Newt Lukes

## 2018-01-11 ENCOUNTER — Encounter (HOSPITAL_COMMUNITY): Payer: Self-pay

## 2018-01-11 MED ORDER — PREDNISONE 10 MG PO TABS: ORAL_TABLET | ORAL | 0 refills | Status: DC

## 2018-01-11 MED ORDER — DOXYCYCLINE HYCLATE 100 MG PO TABS: 100.0000 mg | ORAL_TABLET | Freq: Two times a day (BID) | ORAL | 0 refills | Status: DC

## 2018-01-11 NOTE — Telephone Encounter (Signed)
Sorry to hear. Sounds like AECOPD   Plan  Take doxycycline 100mg  po twice daily x 5 days; take after meals and avoid sunlight   Take prednisone 40 mg daily x 2 days, then 20mg  daily x 2 days, then 10mg  daily x 2 days, then 5mg  daily x 2 days and stop

## 2018-01-13 ENCOUNTER — Encounter (HOSPITAL_COMMUNITY): Payer: Self-pay

## 2018-01-18 ENCOUNTER — Encounter (HOSPITAL_COMMUNITY)
Admission: RE | Admit: 2018-01-18 | Discharge: 2018-01-18 | Disposition: A | Discharge status: Home / Self Care | Payer: Self-pay | Source: Ambulatory Visit | Attending: Internal Medicine | Admitting: Internal Medicine

## 2018-01-20 ENCOUNTER — Encounter (HOSPITAL_COMMUNITY)
Admission: RE | Admit: 2018-01-20 | Discharge: 2018-01-20 | Disposition: A | Discharge status: Home / Self Care | Payer: Self-pay | Source: Ambulatory Visit | Attending: Internal Medicine | Admitting: Internal Medicine

## 2018-01-25 ENCOUNTER — Encounter (HOSPITAL_COMMUNITY)
Admission: RE | Admit: 2018-01-25 | Discharge: 2018-01-25 | Disposition: A | Discharge status: Home / Self Care | Payer: Medicare HMO | Source: Ambulatory Visit | Attending: Internal Medicine | Admitting: Internal Medicine

## 2018-01-26 ENCOUNTER — Encounter: Payer: Self-pay | Admitting: Internal Medicine

## 2018-01-26 ENCOUNTER — Ambulatory Visit: Payer: Medicare HMO | Admitting: Internal Medicine

## 2018-01-26 ENCOUNTER — Telehealth: Payer: Self-pay | Admitting: Internal Medicine

## 2018-01-26 ENCOUNTER — Other Ambulatory Visit (INDEPENDENT_AMBULATORY_CARE_PROVIDER_SITE_OTHER): Payer: Medicare HMO

## 2018-01-26 VITALS — BP 110/70 | HR 114 | Ht 66.0 in | Wt 156.4 lb

## 2018-01-26 DIAGNOSIS — J449 Chronic obstructive pulmonary disease, unspecified: Secondary | ICD-10-CM | POA: Diagnosis not present

## 2018-01-26 DIAGNOSIS — N179 Acute kidney failure, unspecified: Secondary | ICD-10-CM

## 2018-01-26 DIAGNOSIS — J9611 Chronic respiratory failure with hypoxia: Secondary | ICD-10-CM | POA: Diagnosis not present

## 2018-01-26 LAB — BASIC METABOLIC PANEL
BUN: 16 mg/dL (ref 6–23)
CHLORIDE: 96 meq/L (ref 96–112)
CO2: 33 meq/L — AB (ref 19–32)
CREATININE: 1.17 mg/dL (ref 0.40–1.20)
Calcium: 10.2 mg/dL (ref 8.4–10.5)
GFR: 59.86 mL/min — ABNORMAL LOW (ref >60.00)
Glucose, Bld: 166 mg/dL — ABNORMAL HIGH (ref 70–99)
Potassium: 4.3 mEq/L (ref 3.5–5.1)
Sodium: 138 mEq/L (ref 135–145)

## 2018-01-26 MED ORDER — FLUTICASONE FUROATE 100 MCG/ACT IN AEPB: 1.0000 | INHALATION_SPRAY | Freq: Every day | RESPIRATORY_TRACT | 5 refills | Status: DC

## 2018-01-26 NOTE — Addendum Note (Signed)
Addended by: Lorretta Harp on: 01/26/2018 10:05 AM   Modules accepted: Orders

## 2018-01-26 NOTE — Telephone Encounter (Signed)
Called and spoke with pt, she requests to speak with Raquel Sarna only. Routing message to Raquel Sarna to call pt back.

## 2018-01-26 NOTE — Progress Notes (Signed)
Called spoke with patient, advised of lab results / recs as stated by MR.  Pt verbalized understanding and denied any questions. 

## 2018-01-26 NOTE — Progress Notes (Signed)
Subjective:     Patient ID: Katie Greene, female   DOB: June 25, 1956, 62 y.o.   MRN: [id number]  HPI      OV 01/26/2018  Chief Complaint  Patient presents with  . Follow-up    Pt states she has been doing okay since last visit. States she has had a cough x2 weeks and was on an abx and prednisone. States she is coughing up green mucus, SOB is worse. Denies any CP.     OV 02/18/2016  Chief Complaint  Patient presents with  . Follow-up    pt c/o wheezing, runny nose worse qhs- tries to stay indoors d/t heat and humidity worsening her SOB.  Pt requesting an order for a wheelchair.     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 The summer heat is bothering her and she is trying to stay indoors. In accounting for the heat she does not seem to be in an exacerbation. Her sister passed away and she was planning to move to [place] to be with her daughter but these plans of change. She inherited some large piece of land. She's disposing that right now and with that she'll be able to afford living and [place]. Early August 2017 she is going to trip to [place] with her daughter for a family reunion and medication. She wants a wheelchair.  OV 05/26/2016  Chief Complaint  Patient presents with  . Follow-up    Pt states her SOB has slightly worsened since last OV in 02/2016. Pt states her cough is at baseline - with little mucus production with yellow to green mucus. Pt states her appetite has decreased. Pt denies CP/tightness and f/c/s.    [place] 3- 4 3. Chronic hypoxemic respilure.Presents for routine follow-up. She visited [place], [place] for a family reunion in August 2017. This went well without any exacerbation. Overall she is doing well. Shortness of breath and cough is at baseline. There no new issues. She now has a wheelchair. She uses 3 L of oxygen at baseline but sometimes s2    OV 09/02/2016  Chief Complaint  Patient presents  with  . Follow-up    Pt states overall her breathing is unchanged. Pt c/o hoarseness. Pt deneis significant cough and CP/tightness.    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 Presents for routine follow-up. At this point in time COPD stable. She is compliant with the Spiriva and Symbicort. She is 3 L of oxygen at baseline. She's undergoing grief counseling because of her sister's death. There are no new issues she is up-to-date with flu shot. She wants me to fill out some financial assistance form for a Symbicort    OV 12/31/2016  Chief Complaint  Patient presents with  . Follow-up    Pt states her SOB has slightly more SOB. Pt c/o prod cough with little mucus - green in color. Pt denies CP/tightness.    Follow-up Gold stage IV COPD. She is on triple inhaler therapy with Spiriva and Symbicort. She says this is working well for her. There are no new issues. She feels COPD is best ever. She recently in April 2018 separated from her husband and she feels emotionally better as a result. CAT score is 27 and is baseline. USes o2 3L [place]   OV 03/09/2017  Chief Complaint  Patient presents with  . Acute Visit    Pt was given abx and pred but states she only slightly feels  improved. Pt c/o SOB and prod cough with yellow mucus and chest tightness. Pt denies f/c/s.    Gold stage IV COPD patient. She is recently separated from her husband is having insurance difficulties. We did help her with sample of triple inhaler therapy combo trelegy/ . She continues with 4 L nasal oxygen at baseline. Mid July 2008 and she went to [place], [place] for a family get together and was exposed to sick people. She called was end of July 2018 and we treated her with 5 days of cephalexin and prednisone for COPD flareup but she's not any better. She still having significant cough and wheezing and white phlegm with occasional yellow tint. She is worried she has pneumonia. Her friend recently buried her son  with pneumonia. There is no fever or chills or edema or hemoptysis. In the office she is wheezing which is a little bit unusual for her.   OV 06/29/2017  Chief Complaint  Patient presents with  . Follow-up    Pt states that she has been doing okay. States that she has an occ. cough, no change in the SOB. Denies any CP.     Follow-up advanced COPD Gold stage IV with chronic hypoxemic restaurant failure.  Last visit she was in exacerbation.  After the exacerbation resolved be tested for hypercapnia but her PCO2 is 45.  Therefore she did not qualify for nocturnal BiPAP.  Currently she feels fine.  COPD CAT score is 27 documented below and is at baseline.  She is on triple inhaler therapy by [place].  She was on the study for this and it worked well for her.  She is also on albuterol as needed chronic oxygen and N-acetylcysteine to prevent COPD exacerbations.  She is interested in [place] to prevent COPD exacerbations.  She is up-to-date with her flu shot.   OV 10/11/2017  Chief Complaint  Patient presents with  . Follow-up    Pt states she has been doing good since last visit. States she has a mild cough with clear mucus. DME: [employer], 4L pulse     Follow-up advanced COPD Gold stage IV with chronic hypoxemic restaurant failure.  Presents for routine follow-up.  She is compliant with her oxygen and nebulizer/inhaler.  No new issues.  COPD CAT score is 30 and reflects a slight uptake in her basic symptom profile.  There is no exacerbation issues.  She tells me that on October 02, 2017 she ended up in the ER with nausea and vomiting.  Review of the lab shows mild increase in her creatinine and she was told she was dehydrated and to drink a lot of fluids.  She says she feels fine now but wants a creatinine rechecked.  She is also frustrated with pulmonary rehabilitation department because she is on maintenance phase of rehabilitation and did not checking her vital signs especially blood pressure.  On  exam she seems to have thrush.  OV 01/26/2018  Chief Complaint  Patient presents with  . Follow-up    Pt states she has been doing okay since last visit. States she has had a cough x2 weeks and was on an abx and prednisone. States she is coughing up green mucus, SOB is worse. Denies any CP.    ollow-up Gold stage IV COPD with chronic hypoxic respiratory failure [normal AVG without hypercapnia August 2018] : since last visit in March 2019 she did not tolerate her roflumilast. . This was confirmed on chart review. Therefore she is on acetylcysteine  prophylaxis. Also she had issues with inhaler payments and currently she is on STiolto, med review and talking to her confirmed that she is not on any inhaled steroid. She continues on oxygen daily. COPD cat score is stable compared to last visit and shows a slow creep up over one year.on exam she has mild wheeze  Last visit she had acute kidney injury that was mild. With hydration be followed this and it did improve although does not back to her baseline from 2018 patient might chart review of the labs today.  CAT COPD Symptom & Quality of Life Score (GSK trademark) 0 is no burden. 5 is highest burden 12/31/2016  03/09/2017  10/11/2017  01/26/2018   Never Cough -> Cough all the time 3 2 4 3   No phlegm in chest -> Chest is full of phlegm 3 2 3 4   No chest tightness -> Chest feels very tight 3 3 3 3   No dyspnea for 1 flight stairs/hill -> Very dyspneic for 1 flight of stairs 5 5 5 5   No limitations for ADL at home -> Very limited with ADL at home 5 3 5 5   Confident leaving home -> Not at all confident leaving home 2 5 3 4   Sleep soundly -> Do not sleep soundly because of lung condition 2 4 3 4   Lots of Energy -> No energy at all 4 3 3 3   TOTAL Score (max 40)  27 27 30 31          has a past medical history of Anxiety, Arthritis, Chest pain, COPD (chronic obstructive pulmonary disease) ([place]), Depression, Fatigue, GERD (gastroesophageal reflux  disease), Hemorrhoids, Hyperlipidemia, Hypertension, On home oxygen therapy, and Palpitations.   reports that she quit smoking about 5 years ago. Her smoking use included cigarettes. She has a 12.50 pack-year smoking history. She has never used smokeless tobacco.  Past Surgical History:  Procedure Laterality Date  . CHOLECYSTECTOMY     laparosopic attempted, then had to open  . COLONOSCOPY     x2 with polyps removed  . COLONOSCOPY N/A 08/27/2015   Procedure: COLONOSCOPY;  Surgeon: Manus Gunning, MD;  Location: [facility];  Service: Gastroenterology;  Laterality: N/A;  . oopherectomy     1 ovary removed only  . POLYPECTOMY    . SALPINGECTOMY     1 fallopian tube removed    No Known Allergies  Immunization History  Administered Date(s) Administered  . Influenza Split 06/10/2012, 04/03/2014, 04/20/2015  . Influenza,inj,Quad PF,6+ Mos 04/24/2013, 03/03/2016  . Influenza-Unspecified 05/29/2017  . Pneumococcal Conjugate-13 05/07/2015  . Pneumococcal Polysaccharide-23 06/10/2012  . Zoster 05/10/2014    Family History  Problem Relation Age of Onset  . Heart attack Mother   . Pancreatic cancer Mother   . Prostate cancer Father   . Hypertension Father   . Throat cancer Father   . Throat cancer Brother   . Colon cancer Sister   . Brain cancer Sister   . Stomach cancer Neg Hx      Current Outpatient Medications:  .  Acetylcysteine 600 MG CAPS, Take 1 capsule by mouth 2 (two) times daily., Disp: , Rfl:  .  albuterol (PROVENTIL HFA) 108 (90 Base) MCG/ACT inhaler, Inhale 1-2 puffs into the lungs every 6 (six) hours as needed for wheezing or shortness of breath., Disp: , Rfl:  .  albuterol (PROVENTIL) (2.5 MG/3ML) 0.083% nebulizer solution, Take 3 mLs (2.5 mg total) by nebulization every 4 (four) hours as needed for wheezing or  shortness of breath. Dx code j44.9, Disp: 125 mL, Rfl: 6 .  ALPRAZolam (XANAX) 0.5 MG tablet, Take 0.5 mg by mouth 3 (three) times daily as needed  for anxiety. , Disp: , Rfl:  .  aspirin EC 81 MG tablet, Take 81 mg by mouth every morning. , Disp: , Rfl:  .  buPROPion (ZYBAN) 150 MG 12 hr tablet, Take 150 mg by mouth daily after breakfast., Disp: , Rfl:  .  cetirizine (ZYRTEC) 10 MG tablet, Take 10 mg by mouth at bedtime., Disp: , Rfl:  .  cholecalciferol (VITAMIN D) 1000 UNITS tablet, Take 1,000 Units by mouth daily., Disp: , Rfl:  .  diltiazem (CARDIZEM CD) 180 MG 24 hr capsule, Take 180 mg by mouth every morning. , Disp: , Rfl:  .  ferrous sulfate 325 (65 FE) MG tablet, Take 325 mg by mouth daily with breakfast., Disp: , Rfl:  .  fluticasone (FLONASE) 50 MCG/ACT nasal spray, Place 2 sprays into both nostrils daily., Disp: 16 g, Rfl: 5 .  hydrochlorothiazide (HYDRODIURIL) 25 MG tablet, Take 25 mg by mouth every morning. , Disp: , Rfl:  .  metoprolol succinate (TOPROL-XL) 50 MG 24 hr tablet, Take 50 mg by mouth every morning. Take with or immediately following a meal., Disp: , Rfl:  .  omeprazole (PRILOSEC) 20 MG capsule, Take 20 mg by mouth daily. , Disp: , Rfl:  .  OXYGEN, Inhale 2-3 L into the lungs daily., Disp: , Rfl:  .  pravastatin (PRAVACHOL) 80 MG tablet, Take 80 mg by mouth every morning. , Disp: , Rfl:  .  spironolactone (ALDACTONE) 50 MG tablet, Take 25 mg by mouth every morning. , Disp: , Rfl:  .  Tiotropium Bromide-Olodaterol (STIOLTO RESPIMAT) 2.5-2.5 MCG/ACT AERS, Inhale 2 puffs into the lungs daily., Disp: 1 Inhaler, Rfl: 3    Review of Systems     Objective:   Physical Exam  Constitutional: She is oriented to person, place, and time. She appears well-developed and well-nourished. No distress.  HENT:  Head: Normocephalic and atraumatic.  Right Ear: External ear normal.  Left Ear: External ear normal.  Mouth/Throat: Oropharynx is clear and moist. No oropharyngeal exudate.  o2 on  Eyes: Pupils are equal, round, and reactive to light. Conjunctivae and EOM are normal. Right eye exhibits no discharge. Left eye exhibits  no discharge. No scleral icterus.  Neck: Normal range of motion. Neck supple. No JVD present. No tracheal deviation present. No thyromegaly present.  Cardiovascular: Normal rate, regular rhythm, normal heart sounds and intact distal pulses. Exam reveals no gallop and no friction rub.  No murmur heard. Pulmonary/Chest: Effort normal. No respiratory distress. She has wheezes. She has no rales. She exhibits no tenderness.  Abdominal: Soft. Bowel sounds are normal. She exhibits no distension and no mass. There is no tenderness. There is no rebound and no guarding.  Musculoskeletal: Normal range of motion. She exhibits no edema or tenderness.  Chronic edema  Lymphadenopathy:    She has no cervical adenopathy.  Neurological: She is alert and oriented to person, place, and time. She has normal reflexes. No cranial nerve deficit. She exhibits normal muscle tone. Coordination (.diag) normal.  Skin: Skin is warm and dry. No rash noted. She is not diaphoretic. No erythema. No pallor.  Psychiatric: She has a normal mood and affect. Her behavior is normal. Judgment and thought content normal.  Vitals reviewed.  Today's Vitals   01/26/18 0907  BP: 110/70  Pulse: (!) 114  SpO2:  96%  Weight: 156 lb 6.4 oz (70.9 kg)  Height: 5\' 6"  (1.676 m)    Estimated body mass index is 25.24 kg/m as calculated from the following:   Height as of this encounter: 5\' 6"  (1.676 m).   Weight as of this encounter: 156 lb 6.4 oz (70.9 kg).     Assessment:       ICD-10-CM   1. Stage 4 very severe COPD by GOLD classification ([place]) J44.9   2. Chronic respiratory failure with hypoxia (HCC) J96.11   3. Acute kidney injury ([place]) W26.3 Basic Metabolic Panel (BMET)       Plan:     Stage 4 very severe COPD by GOLD classification ([place]) Chronic respiratory failure with hypoxia ([place])  - stable disease - continue o2 - continue  Stiolto, Acetylcysteine, flonase, scheduled - you need to be on an inhaled steroid - I am  surprised this fell off the med list due many changes  - sart arunity daily - please confirm GI intolerance in past with daliresp so we can add to allergy list - continue rehab   Acute kidney injury ([place]) - improved as of march 2019 but noat at 2018 baseline - recheck bmet   Followup - 3-5 monthis or sooner if needed   Dr. Brand Males, M.D., [facility].C.P Pulmonary and Critical Care Medicine Staff Physician, [place] Director - Interstitial Lung Disease  Program  Pulmonary [place] [facility] [place], [place], [address]  Pager: [phone], If no answer or between  15:00h - 7:00h: call [phone] Telephone: [phone]

## 2018-01-26 NOTE — Patient Instructions (Addendum)
Stage 4 very severe COPD by GOLD classification ([place]) Chronic respiratory failure with hypoxia (HCC)  - stable disease - continue o2 - continue  Stiolto, Acetylcysteine, flonase, scheduled - you need to be on an inhaled steroid - I am surprised this fell off the med list due many changes  - sart arunity daily - please confirm GI intolerance in past with daliresp so we can add to allergy list - continue rehab   Acute kidney injury [facility]) - improved as of march 2019 but noat at 2018 baseline - recheck bmet   Followup - 3-5 monthis or sooner if needed

## 2018-01-26 NOTE — Telephone Encounter (Signed)
Called and spoke with pt letting her know the phone call that we had received from [place] that stated they could not provide her with POC but could provide with small portable cylinders.  I stated to pt after I talked with [employer] I would call her back. Pt expressed understanding.  Called [facility] and spoke with Jeneen Rinks who stated they are not doing POCs for existing pts due to running out of stock. They are providing POCs for new referrals but also stated they are having problems due to them not being durable and not lasting that long.  Jeneen Rinks wanted to know if MR might want to get pt a Trilogy or life 2000 to use at night due to pt not qualifying previously for the BIPAP.  Pt might qualify due to FEV1% predicted but wanted to see if it was something MR wanted to order for pt.  MR, please advise if you want to start pt on Trilogy or life 2000 at night? Thanks!

## 2018-01-26 NOTE — Telephone Encounter (Signed)
Requesting to speak to Raquel Sarna about this.

## 2018-01-27 ENCOUNTER — Encounter (HOSPITAL_COMMUNITY)
Admission: RE | Admit: 2018-01-27 | Discharge: 2018-01-27 | Disposition: A | Discharge status: Home / Self Care | Payer: Self-pay | Source: Ambulatory Visit | Attending: Internal Medicine | Admitting: Internal Medicine

## 2018-01-27 NOTE — Telephone Encounter (Signed)
Understood - no POC but they can give her small portable cylinder. IF they want to try triology on basis of Fev1 that is fine. Will need to come in to see TP/SG for necessity visit

## 2018-01-28 ENCOUNTER — Telehealth: Payer: Self-pay | Admitting: Internal Medicine

## 2018-01-28 ENCOUNTER — Other Ambulatory Visit: Payer: Self-pay

## 2018-01-28 MED ORDER — FLUTICASONE FUROATE 100 MCG/ACT IN AEPB: 1.0000 | INHALATION_SPRAY | Freq: Every day | RESPIRATORY_TRACT | 5 refills | Status: DC

## 2018-01-28 NOTE — Telephone Encounter (Signed)
Called [employer] and spoke with Jeneen Rinks stating we were going to go with Trilogy for pt per MR.  Jeneen Rinks expressed understanding and stated he was going to get paperwork together and bring it to have MR signed so that he can get pt started on it over the weekend.  Will keep encounter open until it has been taken care of for pt.

## 2018-01-28 NOTE — Telephone Encounter (Signed)
Paperwork for trilogy vent was brought to office by Jeneen Rinks. MR signed the order form for the vent.   Pt's PFT results have been printed out and an addendum to pt's last OV with MR 6/26 was done stating necessary information.  Paperwork was handed to [place] who stated she would fax all the paperwork to Jeneen Rinks and also stated she would call Jeneen Rinks once it has been faxed.  Routing the encounter to Rodena Piety so she can state that paperwork has all been faxed to [place].

## 2018-01-28 NOTE — Telephone Encounter (Signed)
Patient had a question about her Arnuity. This has been addressed in a separate MyChart message. Will close this encounter.

## 2018-01-28 NOTE — Progress Notes (Signed)
Katie Greene's chronic respiratory failure due to severe COPD is life threatening. Spirometry results from Katie Greene's pulmonary function test reveals severe obstructive ventilatory defect.  Katie Greene would benefit from non-invasive ventilation. Without this therapy, she is at high risk of ending up with worsening symptoms, worsened respiratory failure, need for ER visits, and/or recurrent hospitalizations.   Bilevel device unable to adequately support her nocturnal support her nocturnal ventilation needs. She would benefit from therapy from the trilogy vent with set tidal volumes and pressure support.

## 2018-01-29 DIAGNOSIS — J449 Chronic obstructive pulmonary disease, unspecified: Secondary | ICD-10-CM | POA: Diagnosis not present

## 2018-01-29 DIAGNOSIS — J9611 Chronic respiratory failure with hypoxia: Secondary | ICD-10-CM | POA: Diagnosis not present

## 2018-01-31 NOTE — Telephone Encounter (Signed)
The signed form and all other information that Jeneen Rinks with [facility] needed was faxed to [place] on 01/28/2018. The patient was setup on 01/29/2018

## 2018-02-01 ENCOUNTER — Encounter (HOSPITAL_COMMUNITY): Payer: Self-pay

## 2018-02-01 DIAGNOSIS — J42 Unspecified chronic bronchitis: Secondary | ICD-10-CM | POA: Insufficient documentation

## 2018-02-03 DIAGNOSIS — J449 Chronic obstructive pulmonary disease, unspecified: Secondary | ICD-10-CM | POA: Diagnosis not present

## 2018-02-08 ENCOUNTER — Encounter (HOSPITAL_COMMUNITY)
Admission: RE | Admit: 2018-02-08 | Discharge: 2018-02-08 | Disposition: A | Discharge status: Home / Self Care | Payer: Self-pay | Source: Ambulatory Visit | Attending: Internal Medicine | Admitting: Internal Medicine

## 2018-02-10 ENCOUNTER — Encounter (HOSPITAL_COMMUNITY)
Admission: RE | Admit: 2018-02-10 | Discharge: 2018-02-10 | Disposition: A | Discharge status: Home / Self Care | Payer: Self-pay | Source: Ambulatory Visit | Attending: Internal Medicine | Admitting: Internal Medicine

## 2018-02-15 ENCOUNTER — Encounter (HOSPITAL_COMMUNITY)
Admission: RE | Admit: 2018-02-15 | Discharge: 2018-02-15 | Disposition: A | Discharge status: Home / Self Care | Payer: Self-pay | Source: Ambulatory Visit | Attending: Internal Medicine | Admitting: Internal Medicine

## 2018-02-17 ENCOUNTER — Encounter (HOSPITAL_COMMUNITY): Payer: Self-pay

## 2018-02-18 ENCOUNTER — Ambulatory Visit: Payer: Medicare HMO | Admitting: Gastroenterology

## 2018-02-22 ENCOUNTER — Encounter (HOSPITAL_COMMUNITY)
Admission: RE | Admit: 2018-02-22 | Discharge: 2018-02-22 | Disposition: A | Discharge status: Home / Self Care | Payer: Self-pay | Source: Ambulatory Visit | Attending: Internal Medicine | Admitting: Internal Medicine

## 2018-02-24 ENCOUNTER — Encounter (HOSPITAL_COMMUNITY): Payer: Self-pay

## 2018-02-25 ENCOUNTER — Telehealth: Payer: Self-pay | Admitting: Internal Medicine

## 2018-02-25 MED ORDER — CEPHALEXIN 500 MG PO CAPS: 500.0000 mg | ORAL_CAPSULE | Freq: Three times a day (TID) | ORAL | 0 refills | Status: DC

## 2018-02-25 MED ORDER — DOXYCYCLINE HYCLATE 100 MG PO TABS: 100.0000 mg | ORAL_TABLET | Freq: Two times a day (BID) | ORAL | 0 refills | Status: DC

## 2018-02-25 NOTE — Telephone Encounter (Signed)
Pt is aware of Rx's and sent to [facility] pharmacy(confirmed location on file). Nothing further needed at this time.

## 2018-02-25 NOTE — Telephone Encounter (Signed)
Called and spoke with pt and she is going out of town on Tuesday and wanted to see if MR would be willing to give her prednisone and abx to keep on hand in case she needs this while she is out of town. MR please advise. Thanks   Allergies  Allergen Reactions  . Daliresp [Roflumilast]

## 2018-02-25 NOTE — Telephone Encounter (Signed)
Ok to take  cephalexin 500mg  three times daily x  5 days  Take doxycycline 100mg  po twice daily x 5 days; take after meals and avoid sunlight    Allergies  Allergen Reactions  . Daliresp [Roflumilast]

## 2018-02-28 DIAGNOSIS — J9611 Chronic respiratory failure with hypoxia: Secondary | ICD-10-CM | POA: Diagnosis not present

## 2018-02-28 DIAGNOSIS — J449 Chronic obstructive pulmonary disease, unspecified: Secondary | ICD-10-CM | POA: Diagnosis not present

## 2018-03-01 ENCOUNTER — Encounter (HOSPITAL_COMMUNITY): Payer: Self-pay

## 2018-03-02 ENCOUNTER — Telehealth: Payer: Self-pay | Admitting: Internal Medicine

## 2018-03-02 NOTE — Telephone Encounter (Signed)
Attemtped contact Hinton Dyer with [employer] Did not receive an answer. No option to leave a message. Will try back.

## 2018-03-03 ENCOUNTER — Encounter (HOSPITAL_COMMUNITY): Payer: Self-pay

## 2018-03-03 DIAGNOSIS — J42 Unspecified chronic bronchitis: Secondary | ICD-10-CM | POA: Insufficient documentation

## 2018-03-03 NOTE — Telephone Encounter (Signed)
Called [facility]. Spoke with Berdine Addison. She is going to have Hinton Dyer return our call.

## 2018-03-03 NOTE — Telephone Encounter (Signed)
Good suggeswtion . Yes, you can cancel renal referral. Thanks

## 2018-03-03 NOTE — Telephone Encounter (Signed)
Called and spoke with Hinton Dyer with [facility] at phone [phone] Darel Hong that MR agreed to cancel the renal referral Hinton Dyer cancelled the referral today Nothing further needed

## 2018-03-03 NOTE — Telephone Encounter (Signed)
Hinton Dyer with nephrology called  She states that they called to make appt for pt b/c we referred  Pt told her that this was no longer needed  I looked at her last labs from 01/26/18 and creat was normal but it does not say to cancel appt  Please confirm that she does not need to be seen by nephrology, thanks

## 2018-03-06 DIAGNOSIS — J449 Chronic obstructive pulmonary disease, unspecified: Secondary | ICD-10-CM | POA: Diagnosis not present

## 2018-03-08 ENCOUNTER — Encounter (HOSPITAL_COMMUNITY): Payer: Self-pay

## 2018-03-10 ENCOUNTER — Encounter (HOSPITAL_COMMUNITY): Payer: Self-pay

## 2018-03-11 DIAGNOSIS — Z1231 Encounter for screening mammogram for malignant neoplasm of breast: Secondary | ICD-10-CM | POA: Diagnosis not present

## 2018-03-15 ENCOUNTER — Encounter (HOSPITAL_COMMUNITY): Payer: Self-pay

## 2018-03-17 ENCOUNTER — Encounter (HOSPITAL_COMMUNITY): Payer: Self-pay

## 2018-03-21 NOTE — Telephone Encounter (Signed)
To Whom It May concern   Katie Greene 22-Jun-1956 has been my patient at [facility] since November 2013. She has end-stage or Gold stage 4 COPD. She is on __ Liters of oxygen. Her lungs are functioning at 28-36% predicted. Her symptom burden is very high. It is a miracle she is still alive. The only reason she is alive is that she has displayed phenomenal grit in fighting her disease by keeping up with her disease and taking all her medications and being pro-active about her health. During this time I have heard about lot of personal setback with death and divorce and I was very worried she would die from the stress of all this. Every year I am always surprised she has remained alive and this is remarkable testimony to her fighting spirit. She always came by herself for the doctor visits which has made her tenacity and fight against the odds truly remarkable  Please do not hesitate to reach me if you have any questions  Sincerely yours   Dr. Brand Males

## 2018-03-21 NOTE — Telephone Encounter (Signed)
MR please advise. Thanks! 

## 2018-03-22 ENCOUNTER — Encounter (HOSPITAL_COMMUNITY)
Admission: RE | Admit: 2018-03-22 | Discharge: 2018-03-22 | Disposition: A | Discharge status: Home / Self Care | Payer: Self-pay | Source: Ambulatory Visit | Attending: Internal Medicine | Admitting: Internal Medicine

## 2018-03-23 ENCOUNTER — Encounter: Payer: Self-pay | Admitting: *Deleted

## 2018-03-24 ENCOUNTER — Encounter (HOSPITAL_COMMUNITY)
Admission: RE | Admit: 2018-03-24 | Discharge: 2018-03-24 | Disposition: A | Discharge status: Home / Self Care | Payer: Self-pay | Source: Ambulatory Visit | Attending: Internal Medicine | Admitting: Internal Medicine

## 2018-03-29 ENCOUNTER — Encounter (HOSPITAL_COMMUNITY)
Admission: RE | Admit: 2018-03-29 | Discharge: 2018-03-29 | Disposition: A | Discharge status: Home / Self Care | Payer: Medicare HMO | Source: Ambulatory Visit | Attending: Internal Medicine | Admitting: Internal Medicine

## 2018-03-31 ENCOUNTER — Encounter (HOSPITAL_COMMUNITY)
Admission: RE | Admit: 2018-03-31 | Discharge: 2018-03-31 | Disposition: A | Discharge status: Home / Self Care | Payer: Medicare HMO | Source: Ambulatory Visit | Attending: Internal Medicine | Admitting: Internal Medicine

## 2018-03-31 DIAGNOSIS — J9611 Chronic respiratory failure with hypoxia: Secondary | ICD-10-CM | POA: Diagnosis not present

## 2018-03-31 DIAGNOSIS — J449 Chronic obstructive pulmonary disease, unspecified: Secondary | ICD-10-CM | POA: Diagnosis not present

## 2018-04-05 ENCOUNTER — Encounter (HOSPITAL_COMMUNITY): Payer: Self-pay

## 2018-04-05 DIAGNOSIS — J42 Unspecified chronic bronchitis: Secondary | ICD-10-CM | POA: Insufficient documentation

## 2018-04-06 DIAGNOSIS — J449 Chronic obstructive pulmonary disease, unspecified: Secondary | ICD-10-CM | POA: Diagnosis not present

## 2018-04-07 ENCOUNTER — Encounter (HOSPITAL_COMMUNITY): Payer: Self-pay

## 2018-04-09 DIAGNOSIS — Z23 Encounter for immunization: Secondary | ICD-10-CM | POA: Diagnosis not present

## 2018-04-12 ENCOUNTER — Encounter (HOSPITAL_COMMUNITY)
Admission: RE | Admit: 2018-04-12 | Discharge: 2018-04-12 | Disposition: A | Discharge status: Home / Self Care | Payer: Self-pay | Source: Ambulatory Visit | Attending: Internal Medicine | Admitting: Internal Medicine

## 2018-04-13 ENCOUNTER — Encounter: Payer: Self-pay | Admitting: Gastroenterology

## 2018-04-13 ENCOUNTER — Ambulatory Visit: Payer: Medicare HMO | Admitting: Gastroenterology

## 2018-04-13 VITALS — BP 98/64 | HR 80 | Ht 65.0 in | Wt 158.5 lb

## 2018-04-13 DIAGNOSIS — R634 Abnormal weight loss: Secondary | ICD-10-CM | POA: Diagnosis not present

## 2018-04-13 DIAGNOSIS — Z86010 Personal history of colonic polyps: Secondary | ICD-10-CM | POA: Diagnosis not present

## 2018-04-13 DIAGNOSIS — F32A Depression, unspecified: Secondary | ICD-10-CM

## 2018-04-13 DIAGNOSIS — F329 Major depressive disorder, single episode, unspecified: Secondary | ICD-10-CM

## 2018-04-13 DIAGNOSIS — R630 Anorexia: Secondary | ICD-10-CM

## 2018-04-13 MED ORDER — MIRTAZAPINE 15 MG PO TABS: 15.0000 mg | ORAL_TABLET | Freq: Every day | ORAL | 3 refills | Status: DC

## 2018-04-13 NOTE — Patient Instructions (Signed)
Normal BMI (Body Mass Index- based on height and weight) is between 19 and 25. Your BMI today is Body mass index is 26.38 kg/m. Katie Greene Please consider follow up  regarding your BMI with your Primary Care Provider.  We have sent the following medications to your pharmacy for you to pick up at your convenience:Remeron  Thank you for entrusting me with your care and choosing [facility].  Dr Havery Moros

## 2018-04-13 NOTE — Progress Notes (Signed)
HPI :  62 year old female here for a follow-up visit. She has a history of oxygen-dependent COPD. She was seen a few months ago for loss of appetite and weight loss, at that time about 20 pounds over the past year. She had a CT scan in June of the abdomen pelvis which did not show any pathology for her symptoms. She had a prior CT scan of her lungs in 2018, due again for another CT next month of her chest.   She states in general her appetite continues to fluctuate. She eats about 1-2 meals per day, usually due to decreased appetite. She denies any nausea or vomiting that bothers her. When she eats her stomach doesn't cause her any pain. She generally tolerates the food that she eats.her weights been stable at 158 pounds for the past few months since we've followed her for this. She takes omeprazole for her reflux usually once daily, she states this controls her symptoms well. She denies any dysphagia. She has been on oxygen for COPD since 2013 and states her respiratory status is stable. She does have some ongoing depression and anxiety which she's been dealing with due to multiple deaths in her family. She states does endorse ongoing depression. She is taking bupropion and Xanax for this issue.  CT scan abdomen / pelvis - 01/08/18 - no acute abnormality  08/27/15 colonoscopy with 3 colon polyps removed, tortuous colon, lipoma of the ascending colon more, normal ileum and internal hemorrhoids, thought cause of positive FOBT. Pathology showed adenomas. Repeat recommended in 08/2018   Past Medical History:  Diagnosis Date  . Anxiety   . Arthritis   . Chest pain    Improved  . 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 Depression   . Fatigue   . GERD (gastroesophageal reflux disease)   . Hemorrhoids   . Hyperlipidemia   . Hypertension   . On home oxygen therapy    oxygen therapy 2-3 L/m nasally 24/7  . Palpitations      Past Surgical History:  Procedure  Laterality Date  . CHOLECYSTECTOMY     laparosopic attempted, then had to open  . COLONOSCOPY     x2 with polyps removed  . COLONOSCOPY N/A 08/27/2015   Procedure: COLONOSCOPY;  Surgeon: Manus Gunning, MD;  Location: [facility];  Service: Gastroenterology;  Laterality: N/A;  . oopherectomy     1 ovary removed only  . POLYPECTOMY    . SALPINGECTOMY     1 fallopian tube removed   Family History  Problem Relation Age of Onset  . Heart attack Mother   . Pancreatic cancer Mother   . Prostate cancer Father   . Hypertension Father   . Throat cancer Father   . Throat cancer Brother   . Colon cancer Sister   . Brain cancer Sister   . Stomach cancer Neg Hx    Social History   Tobacco Use  . Smoking status: Former Smoker    Packs/day: 0.50    Years: 25.00    Pack years: 12.50    Types: Cigarettes    Last attempt to quit: 03/02/2012    Years since quitting: 6.1  . Smokeless tobacco: Never Used  . Tobacco comment: pt states she smoked some and stopped again 8-13.  Substance Use Topics  . Alcohol use: No  . Drug use: No   Current Outpatient Medications  Medication Sig Dispense Refill  . Acetylcysteine 600 MG CAPS  Take 1 capsule by mouth 2 (two) times daily.    Marland Kitchen albuterol (PROVENTIL HFA) 108 (90 Base) MCG/ACT inhaler Inhale 1-2 puffs into the lungs every 6 (six) hours as needed for wheezing or shortness of breath.    Marland Kitchen albuterol (PROVENTIL) (2.5 MG/3ML) 0.083% nebulizer solution Take 3 mLs (2.5 mg total) by nebulization every 4 (four) hours as needed for wheezing or shortness of breath. Dx code j44.9 125 mL 6  . ALPRAZolam (XANAX) 0.5 MG tablet Take 0.5 mg by mouth 3 (three) times daily as needed for anxiety.     . AMBULATORY NON FORMULARY MEDICATION VENTILATOR machine at night    . aspirin EC 81 MG tablet Take 81 mg by mouth every morning.     Marland Kitchen buPROPion (ZYBAN) 150 MG 12 hr tablet Take 150 mg by mouth daily after breakfast.    . cetirizine (ZYRTEC) 10 MG tablet Take 10  mg by mouth at bedtime.    . cholecalciferol (VITAMIN D) 1000 UNITS tablet Take 1,000 Units by mouth daily.    Marland Kitchen diltiazem (CARDIZEM CD) 180 MG 24 hr capsule Take 180 mg by mouth every morning.     . ferrous sulfate 325 (65 FE) MG tablet Take 325 mg by mouth daily with breakfast.    . fluticasone (FLONASE) 50 MCG/ACT nasal spray Place 2 sprays into both nostrils daily. 16 g 5  . Fluticasone Furoate (ARNUITY ELLIPTA) 100 MCG/ACT AEPB Inhale 1 puff into the lungs daily. 30 each 5  . hydrochlorothiazide (HYDRODIURIL) 25 MG tablet Take 25 mg by mouth every morning.     . metoprolol succinate (TOPROL-XL) 50 MG 24 hr tablet Take 50 mg by mouth every morning. Take with or immediately following a meal.    . omeprazole (PRILOSEC) 20 MG capsule Take 20 mg by mouth daily.     . OXYGEN Inhale 2-3 L into the lungs daily.    . pravastatin (PRAVACHOL) 80 MG tablet Take 80 mg by mouth every morning.     Marland Kitchen spironolactone (ALDACTONE) 50 MG tablet Take 25 mg by mouth every morning.     . Tiotropium Bromide-Olodaterol (STIOLTO RESPIMAT) 2.5-2.5 MCG/ACT AERS Inhale 2 puffs into the lungs daily. 1 Inhaler 3   No current facility-administered medications for this visit.    Allergies  Allergen Reactions  . Daliresp [Roflumilast]      Review of Systems: All systems reviewed and negative except where noted in HPI.   Lab Results  Component Value Date   WBC 9.9 10/02/2017   HGB 11.6 (L) 10/02/2017   HCT 36.5 10/02/2017   MCV 89.9 10/02/2017   PLT 307 10/02/2017    Lab Results  Component Value Date   CREATININE 1.17 01/26/2018   BUN 16 01/26/2018   NA 138 01/26/2018   K 4.3 01/26/2018   CL 96 01/26/2018   CO2 33 (H) 01/26/2018    Lab Results  Component Value Date   ALT 15 10/02/2017   AST 23 10/02/2017   ALKPHOS 85 10/02/2017   BILITOT 0.4 10/02/2017     Physical Exam: BP 98/64 (BP Location: Left Arm, Patient Position: Sitting, Cuff Size: Normal)   Pulse 80   Ht 5\' 5"  (1.651 m) Comment:  height measured without shoes  Wt 158 lb 8 oz (71.9 kg)   BMI 26.38 kg/m  Constitutional: Pleasant, female in no acute distress. HEENT: Normocephalic and atraumatic. Conjunctivae are normal. No scleral icterus. Neck supple.  Cardiovascular: Normal rate, regular rhythm.  Pulmonary/chest: Effort normal and  breath sounds normal.  Abdominal: Soft, nondistended, nontender. There are no masses palpable. No hepatomegaly. Extremities: no edema Lymphadenopathy: No cervical adenopathy noted. Neurological: Alert and oriented to person place and time. Skin: Skin is warm and dry. No rashes noted. Psychiatric: Normal mood and affect. Behavior is normal.   ASSESSMENT AND PLAN: 62 year old female here for reassessment following issues:  Weight loss / loss of appetite / depression - ongoing loss of appetite with some poor by mouth intake, however her weights been stable over the past 3 months or so at least. CT imaging has not shown any significant pathology in her labs are normal and reassuring. She has ongoing depression and anxiety despite the regimen she is on. I suspect the depression and anxiety may be related to her loss of appetite. I discussed options with her, I'm recommending we start her on Remeron 15 mg daily at bedtime if no significant interactions with her other medications to help with these issues to help stimulate her appetite. I discussed the risks the medicine and she wanted to try it. I think the yield of EGD is lower at this point, with her only symptom being loss of appetite, as CT is normal and she is on PPI. That being said if she does not improve and her symptoms persist, we will consider EGD. She is higher than average risk for anesthesia in light of her oxygen requirement, her case would need to be done at the hospital with anesthesia support. She wants to avoid this if possible, wishes trial of Remeron first.  History of colon polyps - she is due for surveillance colonoscopy in  January. If she still wishes to proceed with that, this will need to be done at the hospital with anesthesia support. If she ends up having an upper endoscopy before January due to her symptoms as outlined above, we will plan on doing her colonoscopy at the same time to minimize the amount of times she is sedated.  She agreed, I asked her to contact me in 3-4 weeks and update me on how she is doing with the Remeron.  [place] Cellar, MD [facility]

## 2018-04-14 ENCOUNTER — Encounter (HOSPITAL_COMMUNITY): Payer: Self-pay

## 2018-04-19 ENCOUNTER — Encounter (HOSPITAL_COMMUNITY)
Admission: RE | Admit: 2018-04-19 | Discharge: 2018-04-19 | Disposition: A | Discharge status: Home / Self Care | Payer: Self-pay | Source: Ambulatory Visit | Attending: Internal Medicine | Admitting: Internal Medicine

## 2018-04-21 ENCOUNTER — Encounter (HOSPITAL_COMMUNITY)
Admission: RE | Admit: 2018-04-21 | Discharge: 2018-04-21 | Disposition: A | Discharge status: Home / Self Care | Payer: Medicare HMO | Source: Ambulatory Visit | Attending: Internal Medicine | Admitting: Internal Medicine

## 2018-04-26 ENCOUNTER — Encounter (HOSPITAL_COMMUNITY)
Admission: RE | Admit: 2018-04-26 | Discharge: 2018-04-26 | Disposition: A | Discharge status: Home / Self Care | Payer: Self-pay | Source: Ambulatory Visit | Attending: Internal Medicine | Admitting: Internal Medicine

## 2018-04-28 ENCOUNTER — Ambulatory Visit: Payer: Medicare HMO | Admitting: Internal Medicine

## 2018-04-28 ENCOUNTER — Encounter: Payer: Self-pay | Admitting: Internal Medicine

## 2018-04-28 ENCOUNTER — Encounter (HOSPITAL_COMMUNITY): Payer: Self-pay

## 2018-04-28 VITALS — BP 120/80 | HR 97 | Ht 65.0 in | Wt 161.6 lb

## 2018-04-28 DIAGNOSIS — J449 Chronic obstructive pulmonary disease, unspecified: Secondary | ICD-10-CM

## 2018-04-28 DIAGNOSIS — J9611 Chronic respiratory failure with hypoxia: Secondary | ICD-10-CM | POA: Diagnosis not present

## 2018-04-28 NOTE — Progress Notes (Signed)
OV 01/26/2018  Chief Complaint  Patient presents with  . Follow-up    Pt states she has been doing okay since last visit. States she has had a cough x2 weeks and was on an abx and prednisone. States she is coughing up green mucus, SOB is worse. Denies any CP.     OV 02/18/2016  Chief Complaint  Patient presents with  . Follow-up    pt c/o wheezing, runny nose worse qhs- tries to stay indoors d/t heat and humidity worsening her SOB.  Pt requesting an order for a wheelchair.     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 The summer heat is bothering her and she is trying to stay indoors. In accounting for the heat she does not seem to be in an exacerbation. Her sister passed away and she was planning to move to [place] to be with her daughter but these plans of change. She inherited some large piece of land. She's disposing that right now and with that she'll be able to afford living and [place]. Early August 2017 she is going to trip to [place] with her daughter for a family reunion and medication. She wants a wheelchair.  OV 05/26/2016  Chief Complaint  Patient presents with  . Follow-up    Pt states her SOB has slightly worsened since last OV in 02/2016. Pt states her cough is at baseline - with little mucus production with yellow to green mucus. Pt states her appetite has decreased. Pt denies CP/tightness and f/c/s.    [place] 3- 4 3. Chronic hypoxemic respilure.Presents for routine follow-up. She visited [place], [place] for a family reunion in August 2017. This went well without any exacerbation. Overall she is doing well. Shortness of breath and cough is at baseline. There no new issues. She now has a wheelchair. She uses 3 L of oxygen at baseline but sometimes s2    OV 09/02/2016  Chief Complaint  Patient presents with  . Follow-up    Pt states overall her breathing is unchanged. Pt c/o hoarseness. Pt deneis  significant cough and CP/tightness.    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 Presents for routine follow-up. At this point in time COPD stable. She is compliant with the Spiriva and Symbicort. She is 3 L of oxygen at baseline. She's undergoing grief counseling because of her sister's death. There are no new issues she is up-to-date with flu shot. She wants me to fill out some financial assistance form for a Symbicort    OV 12/31/2016  Chief Complaint  Patient presents with  . Follow-up    Pt states her SOB has slightly more SOB. Pt c/o prod cough with little mucus - green in color. Pt denies CP/tightness.    Follow-up Gold stage IV COPD. She is on triple inhaler therapy with Spiriva and Symbicort. She says this is working well for her. There are no new issues. She feels COPD is best ever. She recently in April 2018 separated from her husband and she feels emotionally better as a result. CAT score is 27 and is baseline. USes o2 3L [place]   OV 03/09/2017  Chief Complaint  Patient presents with  . Acute Visit    Pt was given abx and pred but states she only slightly feels improved. Pt c/o SOB and prod cough with yellow mucus and chest tightness. Pt denies f/c/s.    Gold stage IV COPD  patient. She is recently separated from her husband is having insurance difficulties. We did help her with sample of triple inhaler therapy combo trelegy/ . She continues with 4 L nasal oxygen at baseline. Mid July 2008 and she went to [place], [place] for a family get together and was exposed to sick people. She called was end of July 2018 and we treated her with 5 days of cephalexin and prednisone for COPD flareup but she's not any better. She still having significant cough and wheezing and white phlegm with occasional yellow tint. She is worried she has pneumonia. Her friend recently buried her son with pneumonia. There is no fever or chills or edema or hemoptysis. In the office she is wheezing  which is a little bit unusual for her.   OV 06/29/2017  Chief Complaint  Patient presents with  . Follow-up    Pt states that she has been doing okay. States that she has an occ. cough, no change in the SOB. Denies any CP.     Follow-up advanced COPD Gold stage IV with chronic hypoxemic restaurant failure.  Last visit she was in exacerbation.  After the exacerbation resolved be tested for hypercapnia but her PCO2 is 45.  Therefore she did not qualify for nocturnal BiPAP.  Currently she feels fine.  COPD CAT score is 27 documented below and is at baseline.  She is on triple inhaler therapy by [place].  She was on the study for this and it worked well for her.  She is also on albuterol as needed chronic oxygen and N-acetylcysteine to prevent COPD exacerbations.  She is interested in [place] to prevent COPD exacerbations.  She is up-to-date with her flu shot.   OV 10/11/2017  Chief Complaint  Patient presents with  . Follow-up    Pt states she has been doing good since last visit. States she has a mild cough with clear mucus. DME: [employer], 4L pulse     Follow-up advanced COPD Gold stage IV with chronic hypoxemic restaurant failure.  Presents for routine follow-up.  She is compliant with her oxygen and nebulizer/inhaler.  No new issues.  COPD CAT score is 30 and reflects a slight uptake in her basic symptom profile.  There is no exacerbation issues.  She tells me that on October 02, 2017 she ended up in the ER with nausea and vomiting.  Review of the lab shows mild increase in her creatinine and she was told she was dehydrated and to drink a lot of fluids.  She says she feels fine now but wants a creatinine rechecked.  She is also frustrated with pulmonary rehabilitation department because she is on maintenance phase of rehabilitation and did not checking her vital signs especially blood pressure.  On exam she seems to have thrush.  OV 01/26/2018  Chief Complaint  Patient presents with  .  Follow-up    Pt states she has been doing okay since last visit. States she has had a cough x2 weeks and was on an abx and prednisone. States she is coughing up green mucus, SOB is worse. Denies any CP.    ollow-up Gold stage IV COPD with chronic hypoxic respiratory failure [normal AVG without hypercapnia August 2018] : since last visit in March 2019 she did not tolerate her roflumilast. . This was confirmed on chart review. Therefore she is on acetylcysteine prophylaxis. Also she had issues with inhaler payments and currently she is on STiolto, med review and talking to her confirmed that she  is not on any inhaled steroid. She continues on oxygen daily. COPD cat score is stable compared to last visit and shows a slow creep up over one year.on exam she has mild wheeze  Last visit she had acute kidney injury that was mild. With hydration be followed this and it did improve although does not back to her baseline from 2018 patient might chart review of the labs today.      OV 04/28/2018  Subjective:  Patient ID: Katie Greene, female , DOB: 1956-06-25 , age 62 y.o. , MRN: [id number] , ADDRESS: [address] [place] [address]   04/28/2018 -   Chief Complaint  Patient presents with  . Follow-up    Pt states she has been doing well since last visit and states she even has more energy. Pt states she has mild cough. Denies any CP.     HPI Katie Greene 62 y.o. - fu ES- COPD. Since June 2019 on nocturnal non-invasive venilation. AFter this improved CAT score due to less dyspnea, chest tightness and fatigue. SEe CAT score. Continues o2, and other meds. Uptodate with flu shot.     CAT COPD Symptom & Quality of Life Score (GSK trademark) 0 is no burden. 5 is highest burden 12/31/2016  03/09/2017  10/11/2017  01/26/2018  04/28/2018 With nigight bipap  Never Cough -> Cough all the time 3 2 4 3 3   No phlegm in chest -> Chest is full of phlegm 3 2 3 4 2   No chest tightness -> Chest feels  very tight 3 3 3 3  0  No dyspnea for 1 flight stairs/hill -> Very dyspneic for 1 flight of stairs 5 5 5 5 4   No limitations for ADL at home -> Very limited with ADL at home 5 3 5 5 5   Confident leaving home -> Not at all confident leaving home 2 5 3 4 3   Sleep soundly -> Do not sleep soundly because of lung condition 2 4 3 4 3   Lots of Energy -> No energy at all 4 3 3 3  424  TOTAL Score (max 40)  27 27 30 31        ROS - per HPI     has a past medical history of Anxiety, Arthritis, Chest pain, COPD (chronic obstructive pulmonary disease) ([place]), Depression, Fatigue, GERD (gastroesophageal reflux disease), Hemorrhoids, Hyperlipidemia, Hypertension, On home oxygen therapy, and Palpitations.   reports that she quit smoking about 6 years ago. Her smoking use included cigarettes. She has a 12.50 pack-year smoking history. She has never used smokeless tobacco.  Past Surgical History:  Procedure Laterality Date  . CHOLECYSTECTOMY     laparosopic attempted, then had to open  . COLONOSCOPY     x2 with polyps removed  . COLONOSCOPY N/A 08/27/2015   Procedure: COLONOSCOPY;  Surgeon: Manus Gunning, MD;  Location: [facility];  Service: Gastroenterology;  Laterality: N/A;  . oopherectomy     1 ovary removed only  . POLYPECTOMY    . SALPINGECTOMY     1 fallopian tube removed    Allergies  Allergen Reactions  . Daliresp [Roflumilast]     Immunization History  Administered Date(s) Administered  . Influenza Split 06/10/2012, 04/03/2014, 04/20/2015  . Influenza, High Dose Seasonal PF 04/09/2018  . Influenza,inj,Quad PF,6+ Mos 04/24/2013, 03/03/2016  . Influenza-Unspecified 05/29/2017  . Pneumococcal Conjugate-13 05/07/2015  . Pneumococcal Polysaccharide-23 06/10/2012  . Zoster 05/10/2014    Family History  Problem Relation Age of  Onset  . Heart attack Mother   . Pancreatic cancer Mother   . Prostate cancer Father   . Hypertension Father   . Throat cancer Father   .  Throat cancer Brother   . Colon cancer Sister   . Brain cancer Sister   . Stomach cancer Neg Hx      Current Outpatient Medications:  .  Acetylcysteine 600 MG CAPS, Take 1 capsule by mouth 2 (two) times daily., Disp: , Rfl:  .  albuterol (PROVENTIL HFA) 108 (90 Base) MCG/ACT inhaler, Inhale 1-2 puffs into the lungs every 6 (six) hours as needed for wheezing or shortness of breath., Disp: , Rfl:  .  albuterol (PROVENTIL) (2.5 MG/3ML) 0.083% nebulizer solution, Take 3 mLs (2.5 mg total) by nebulization every 4 (four) hours as needed for wheezing or shortness of breath. Dx code j44.9, Disp: 125 mL, Rfl: 6 .  ALPRAZolam (XANAX) 0.5 MG tablet, Take 0.5 mg by mouth 3 (three) times daily as needed for anxiety. , Disp: , Rfl:  .  AMBULATORY NON FORMULARY MEDICATION, VENTILATOR machine at night, Disp: , Rfl:  .  aspirin EC 81 MG tablet, Take 81 mg by mouth every morning. , Disp: , Rfl:  .  buPROPion (ZYBAN) 150 MG 12 hr tablet, Take 150 mg by mouth daily after breakfast., Disp: , Rfl:  .  cetirizine (ZYRTEC) 10 MG tablet, Take 10 mg by mouth at bedtime., Disp: , Rfl:  .  cholecalciferol (VITAMIN D) 1000 UNITS tablet, Take 1,000 Units by mouth daily., Disp: , Rfl:  .  diltiazem (CARDIZEM CD) 180 MG 24 hr capsule, Take 180 mg by mouth every morning. , Disp: , Rfl:  .  ferrous sulfate 325 (65 FE) MG tablet, Take 325 mg by mouth daily with breakfast., Disp: , Rfl:  .  fluticasone (FLONASE) 50 MCG/ACT nasal spray, Place 2 sprays into both nostrils daily., Disp: 16 g, Rfl: 5 .  Fluticasone Furoate (ARNUITY ELLIPTA) 100 MCG/ACT AEPB, Inhale 1 puff into the lungs daily., Disp: 30 each, Rfl: 5 .  hydrochlorothiazide (HYDRODIURIL) 25 MG tablet, Take 25 mg by mouth every morning. , Disp: , Rfl:  .  metoprolol succinate (TOPROL-XL) 50 MG 24 hr tablet, Take 50 mg by mouth every morning. Take with or immediately following a meal., Disp: , Rfl:  .  mirtazapine (REMERON) 15 MG tablet, Take 1 tablet (15 mg total) by  mouth at bedtime., Disp: 30 tablet, Rfl: 3 .  omeprazole (PRILOSEC) 20 MG capsule, Take 20 mg by mouth daily. , Disp: , Rfl:  .  OXYGEN, Inhale 2-3 L into the lungs daily., Disp: , Rfl:  .  pravastatin (PRAVACHOL) 80 MG tablet, Take 80 mg by mouth every morning. , Disp: , Rfl:  .  spironolactone (ALDACTONE) 50 MG tablet, Take 25 mg by mouth every morning. , Disp: , Rfl:  .  temazepam (RESTORIL) 30 MG capsule, TAKE 1 CAPSULE BY MOUTH ONCE DAILY AT BEDTIME AS NEEDED FOR SLEEP, Disp: , Rfl: 4 .  Tiotropium Bromide-Olodaterol (STIOLTO RESPIMAT) 2.5-2.5 MCG/ACT AERS, Inhale 2 puffs into the lungs daily., Disp: 1 Inhaler, Rfl: 3      Objective:   Vitals:   04/28/18 0858  BP: 120/80  Pulse: 97  SpO2: 98%  Weight: 161 lb 9.6 oz (73.3 kg)  Height: 5\' 5"  (1.651 m)    Estimated body mass index is 26.89 kg/m as calculated from the following:   Height as of this encounter: 5\' 5"  (1.651 m).  Weight as of this encounter: 161 lb 9.6 oz (73.3 kg).  @WEIGHTCHANGE @  [employer]   04/28/18 0858  Weight: 161 lb 9.6 oz (73.3 kg)     Physical Exam  General Appearance:    Alert, cooperative, no distress, appears stated age - older , sitting on - chair , Deconditioned looking - better than expected  Head:    Normocephalic, without obvious abnormality, atraumatic  Eyes:    PERRL, conjunctiva/corneas clear,  Ears:    Normal TM's and external ear canals, both ears  Nose:   Nares normal, septum midline, mucosa normal, no drainage    or sinus tenderness. OXYGEN ON  - yes . Patient is @ 3L   Throat:   Lips, mucosa, and tongue normal; teeth and gums normal. Cyanosis on lips - no  Neck:   Supple, symmetrical, trachea midline, no adenopathy;    thyroid:  no enlargement/tenderness/nodules; no carotid   bruit or JVD  Back:     Symmetric, no curvature, ROM normal, no CVA tenderness  Lungs:     Distress - no , Wheeze no, Barrell Chest - yes, Purse lip breathing - yes, mild, Crackles - no   Chest Wall:     No tenderness or deformity. Scars in chest no   Heart:    Regular rate and rhythm, S1 and S2 normal, no rub   or gallop, Murmur - no  Breast Exam:    NOT DONE  Abdomen:     Soft, non-tender, bowel sounds active all four quadrants,    no masses, no organomegaly. Visceral obesity - yes  Genitalia:   NOT DONE  Rectal:   NOT DONE  Extremities:   Extremities normal, atraumatic, Clubbing - no, Edema - no  Pulses:   2+ and symmetric all extremities  Skin:   Stigmata of Connective Tissue Disease - no  Lymph nodes:   Cervical, supraclavicular, and axillary nodes normal  Psychiatric:  Neurologic:    pleasant  CAm-ICU - neg, Alert and Oriented x 3 - yes, Moves all 4s - yes, Speech - normal, Cognition - intact           Assessment:       ICD-10-CM   1. Stage 4 very severe COPD by GOLD classification ([place]) J44.9   2. Chronic respiratory failure with hypoxia (HCC) J96.11        Plan:     Patient Instructions     ICD-10-CM   1. Stage 4 very severe COPD by GOLD classification ([place]) J44.9   2. Chronic respiratory failure with hypoxia (HCC) J96.11     Stable disease.  Symptom score is better after starting nighttime noninvasive ventilation  PLan - continue night bipap - continue day time o2 3L [place] - glad uptodate with flu shot - Please talk to PCP Leanna Battles, MD -  and ensure you get  shingrix (GSK) inactivated vaccine against shingles - continue NAC, flonase, arnuity, stiolto scheduld - albuterol as needed  Followup - 3 months of sooner if needed; CAT score at followup   (> 50% of this 15 min visit spent in face to face counseling or/and coordination of care by this undersigned MD - Dr Brand Males. This includes one or more of the following documented above: discussion of test results, diagnostic or treatment recommendations, prognosis, risks and benefits of management options, instructions, education, compliance or risk-factor reduction)   SIGNATURE    Dr. Brand Males, M.D., F.C.C.P,  Pulmonary and Critical Care Medicine Staff  Physician, [place] Director - Interstitial Lung Disease  Program  [facility] [place] [facility] [place], [place], [address]  Pager: [phone], If no answer or between  15:00h - 7:00h: call [phone] Telephone: [phone]  9:14 AM 04/28/2018

## 2018-04-28 NOTE — Patient Instructions (Addendum)
ICD-10-CM   1. Stage 4 very severe COPD by GOLD classification ([place]) J44.9   2. Chronic respiratory failure with hypoxia (HCC) J96.11     Stable disease.  Symptom score is better after starting nighttime noninvasive ventilation  PLan - continue night bipap - continue day time o2 3L [place] - glad uptodate with flu shot - Please talk to PCP Leanna Battles, MD -  and ensure you get  shingrix ([place]) inactivated vaccine against shingles - continue NAC, flonase, arnuity, stiolto scheduld - albuterol as needed  Followup - 3 months of sooner if needed; CAT score at followup

## 2018-05-01 DIAGNOSIS — J449 Chronic obstructive pulmonary disease, unspecified: Secondary | ICD-10-CM | POA: Diagnosis not present

## 2018-05-01 DIAGNOSIS — J9611 Chronic respiratory failure with hypoxia: Secondary | ICD-10-CM | POA: Diagnosis not present

## 2018-05-03 ENCOUNTER — Encounter (HOSPITAL_COMMUNITY): Payer: Self-pay

## 2018-05-03 DIAGNOSIS — J42 Unspecified chronic bronchitis: Secondary | ICD-10-CM | POA: Insufficient documentation

## 2018-05-06 DIAGNOSIS — J449 Chronic obstructive pulmonary disease, unspecified: Secondary | ICD-10-CM | POA: Diagnosis not present

## 2018-05-18 ENCOUNTER — Other Ambulatory Visit: Payer: Self-pay

## 2018-05-18 ENCOUNTER — Telehealth (HOSPITAL_COMMUNITY): Payer: Self-pay

## 2018-05-18 MED ORDER — MIRTAZAPINE 30 MG PO TABS: 30.0000 mg | ORAL_TABLET | Freq: Every day | ORAL | 3 refills | Status: DC

## 2018-05-24 ENCOUNTER — Encounter (HOSPITAL_COMMUNITY)
Admission: RE | Admit: 2018-05-24 | Discharge: 2018-05-24 | Disposition: A | Discharge status: Home / Self Care | Payer: Self-pay | Source: Ambulatory Visit | Attending: Internal Medicine | Admitting: Internal Medicine

## 2018-05-26 ENCOUNTER — Encounter (HOSPITAL_COMMUNITY)
Admission: RE | Admit: 2018-05-26 | Discharge: 2018-05-26 | Disposition: A | Discharge status: Home / Self Care | Payer: Self-pay | Source: Ambulatory Visit | Attending: Internal Medicine | Admitting: Internal Medicine

## 2018-05-31 ENCOUNTER — Encounter (HOSPITAL_COMMUNITY)
Admission: RE | Admit: 2018-05-31 | Discharge: 2018-05-31 | Disposition: A | Discharge status: Home / Self Care | Payer: Self-pay | Source: Ambulatory Visit | Attending: Internal Medicine | Admitting: Internal Medicine

## 2018-05-31 DIAGNOSIS — J449 Chronic obstructive pulmonary disease, unspecified: Secondary | ICD-10-CM | POA: Diagnosis not present

## 2018-05-31 DIAGNOSIS — J9611 Chronic respiratory failure with hypoxia: Secondary | ICD-10-CM | POA: Diagnosis not present

## 2018-06-02 DIAGNOSIS — I10 Essential (primary) hypertension: Secondary | ICD-10-CM | POA: Diagnosis not present

## 2018-06-02 DIAGNOSIS — R82998 Other abnormal findings in urine: Secondary | ICD-10-CM | POA: Diagnosis not present

## 2018-06-02 DIAGNOSIS — E7849 Other hyperlipidemia: Secondary | ICD-10-CM | POA: Diagnosis not present

## 2018-06-03 ENCOUNTER — Telehealth: Payer: Self-pay | Admitting: Internal Medicine

## 2018-06-03 ENCOUNTER — Ambulatory Visit (INDEPENDENT_AMBULATORY_CARE_PROVIDER_SITE_OTHER)
Admission: RE | Admit: 2018-06-03 | Discharge: 2018-06-03 | Disposition: A | Discharge status: Home / Self Care | Payer: Medicare HMO | Source: Ambulatory Visit | Attending: Acute Care | Admitting: Acute Care

## 2018-06-03 DIAGNOSIS — Z87891 Personal history of nicotine dependence: Secondary | ICD-10-CM | POA: Diagnosis not present

## 2018-06-03 DIAGNOSIS — Z122 Encounter for screening for malignant neoplasm of respiratory organs: Secondary | ICD-10-CM

## 2018-06-03 NOTE — Telephone Encounter (Signed)
Called and was on hold for over 5 min  WCB

## 2018-06-06 DIAGNOSIS — J449 Chronic obstructive pulmonary disease, unspecified: Secondary | ICD-10-CM | POA: Diagnosis not present

## 2018-06-06 NOTE — Telephone Encounter (Signed)
I have call the Katie Greene they do have this approval from June of 2019-june2020 and the patient is aware and nothing further is needed at this time.

## 2018-06-07 ENCOUNTER — Encounter (HOSPITAL_COMMUNITY)
Admission: RE | Admit: 2018-06-07 | Discharge: 2018-06-07 | Disposition: A | Discharge status: Home / Self Care | Payer: Self-pay | Source: Ambulatory Visit | Attending: Internal Medicine | Admitting: Internal Medicine

## 2018-06-07 DIAGNOSIS — J42 Unspecified chronic bronchitis: Secondary | ICD-10-CM | POA: Insufficient documentation

## 2018-06-09 DIAGNOSIS — F3289 Other specified depressive episodes: Secondary | ICD-10-CM | POA: Diagnosis not present

## 2018-06-09 DIAGNOSIS — K5909 Other constipation: Secondary | ICD-10-CM | POA: Diagnosis not present

## 2018-06-09 DIAGNOSIS — K219 Gastro-esophageal reflux disease without esophagitis: Secondary | ICD-10-CM | POA: Diagnosis not present

## 2018-06-09 DIAGNOSIS — E7849 Other hyperlipidemia: Secondary | ICD-10-CM | POA: Diagnosis not present

## 2018-06-09 DIAGNOSIS — I10 Essential (primary) hypertension: Secondary | ICD-10-CM | POA: Diagnosis not present

## 2018-06-09 DIAGNOSIS — M25562 Pain in left knee: Secondary | ICD-10-CM | POA: Diagnosis not present

## 2018-06-09 DIAGNOSIS — M545 Low back pain: Secondary | ICD-10-CM | POA: Diagnosis not present

## 2018-06-09 DIAGNOSIS — Z0000 Encounter for general adult medical examination without abnormal findings: Secondary | ICD-10-CM | POA: Diagnosis not present

## 2018-06-09 DIAGNOSIS — E662 Morbid (severe) obesity with alveolar hypoventilation: Secondary | ICD-10-CM | POA: Diagnosis not present

## 2018-06-09 DIAGNOSIS — I2781 Cor pulmonale (chronic): Secondary | ICD-10-CM | POA: Diagnosis not present

## 2018-06-10 ENCOUNTER — Other Ambulatory Visit: Payer: Self-pay | Admitting: Acute Care

## 2018-06-10 DIAGNOSIS — Z122 Encounter for screening for malignant neoplasm of respiratory organs: Secondary | ICD-10-CM

## 2018-06-10 DIAGNOSIS — Z87891 Personal history of nicotine dependence: Secondary | ICD-10-CM

## 2018-06-17 DIAGNOSIS — Z1212 Encounter for screening for malignant neoplasm of rectum: Secondary | ICD-10-CM | POA: Diagnosis not present

## 2018-06-21 ENCOUNTER — Encounter (HOSPITAL_COMMUNITY)
Admission: RE | Admit: 2018-06-21 | Discharge: 2018-06-21 | Disposition: A | Discharge status: Home / Self Care | Payer: Self-pay | Source: Ambulatory Visit | Attending: Internal Medicine | Admitting: Internal Medicine

## 2018-06-23 ENCOUNTER — Encounter (HOSPITAL_COMMUNITY)
Admission: RE | Admit: 2018-06-23 | Discharge: 2018-06-23 | Disposition: A | Discharge status: Home / Self Care | Payer: Self-pay | Source: Ambulatory Visit | Attending: Internal Medicine | Admitting: Internal Medicine

## 2018-06-28 ENCOUNTER — Encounter (HOSPITAL_COMMUNITY): Payer: Self-pay

## 2018-07-01 DIAGNOSIS — J9611 Chronic respiratory failure with hypoxia: Secondary | ICD-10-CM | POA: Diagnosis not present

## 2018-07-01 DIAGNOSIS — J449 Chronic obstructive pulmonary disease, unspecified: Secondary | ICD-10-CM | POA: Diagnosis not present

## 2018-07-05 ENCOUNTER — Encounter (HOSPITAL_COMMUNITY)
Admission: RE | Admit: 2018-07-05 | Discharge: 2018-07-05 | Disposition: A | Discharge status: Home / Self Care | Payer: Self-pay | Source: Ambulatory Visit | Attending: Internal Medicine | Admitting: Internal Medicine

## 2018-07-05 DIAGNOSIS — J42 Unspecified chronic bronchitis: Secondary | ICD-10-CM | POA: Insufficient documentation

## 2018-07-06 DIAGNOSIS — J449 Chronic obstructive pulmonary disease, unspecified: Secondary | ICD-10-CM | POA: Diagnosis not present

## 2018-07-07 ENCOUNTER — Telehealth: Payer: Self-pay | Admitting: Internal Medicine

## 2018-07-07 ENCOUNTER — Encounter (HOSPITAL_COMMUNITY)
Admission: RE | Admit: 2018-07-07 | Discharge: 2018-07-07 | Disposition: A | Discharge status: Home / Self Care | Payer: Self-pay | Source: Ambulatory Visit | Attending: Internal Medicine | Admitting: Internal Medicine

## 2018-07-07 NOTE — Telephone Encounter (Signed)
LMTCB

## 2018-07-08 NOTE — Telephone Encounter (Signed)
Called [employer] and spoke with Katie Greene to see if there was any type of assistance she could receive to help her pay for the vent. Per Katie Greene, they do have a hardship form to see if she qualifies. I stated to Katie Greene that I would call pt in regards to this and she also stated she would call pt after me to discuss this with her.  Called and spoke with pt in regards to the form and pt stated she already had done the form but there was a delay with her receiving the vent to begin with. Pt stated her rental for the vent is $160 a month. I stated to pt that Huey Romans should be calling her soon so hopefully things can be taken care of for her. Pt expressed understanding.

## 2018-07-12 ENCOUNTER — Encounter: Payer: Self-pay | Admitting: Internal Medicine

## 2018-07-12 ENCOUNTER — Ambulatory Visit (INDEPENDENT_AMBULATORY_CARE_PROVIDER_SITE_OTHER): Payer: Medicare HMO | Admitting: Internal Medicine

## 2018-07-12 ENCOUNTER — Encounter (HOSPITAL_COMMUNITY): Payer: Self-pay

## 2018-07-12 VITALS — BP 118/68 | HR 87 | Ht 65.0 in | Wt 161.2 lb

## 2018-07-12 DIAGNOSIS — R634 Abnormal weight loss: Secondary | ICD-10-CM

## 2018-07-12 DIAGNOSIS — J449 Chronic obstructive pulmonary disease, unspecified: Secondary | ICD-10-CM | POA: Diagnosis not present

## 2018-07-12 DIAGNOSIS — Z7184 Encounter for health counseling related to travel: Secondary | ICD-10-CM

## 2018-07-12 DIAGNOSIS — J9611 Chronic respiratory failure with hypoxia: Secondary | ICD-10-CM

## 2018-07-12 MED ORDER — DOXYCYCLINE HYCLATE 100 MG PO TABS: 100.0000 mg | ORAL_TABLET | Freq: Two times a day (BID) | ORAL | 0 refills | Status: DC

## 2018-07-12 MED ORDER — PREDNISONE 10 MG PO TABS: ORAL_TABLET | ORAL | 0 refills | Status: DC

## 2018-07-12 NOTE — Addendum Note (Signed)
Addended by: Maryanna Shape A on: 07/12/2018 09:30 AM   Modules accepted: Orders

## 2018-07-12 NOTE — Progress Notes (Signed)
OV 01/26/2018  Chief Complaint  Patient presents with  . Follow-up    Pt states she has been doing okay since last visit. States she has had a cough x2 weeks and was on an abx and prednisone. States she is coughing up green mucus, SOB is worse. Denies any CP.     OV 02/18/2016  Chief Complaint  Patient presents with  . Follow-up    pt c/o wheezing, runny nose worse qhs- tries to stay indoors d/t heat and humidity worsening her SOB.  Pt requesting an order for a wheelchair.     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 The summer heat is bothering her and she is trying to stay indoors. In accounting for the heat she does not seem to be in an exacerbation. Her sister passed away and she was planning to move to [place] to be with her daughter but these plans of change. She inherited some large piece of land. She's disposing that right now and with that she'll be able to afford living and [place]. Early August 2017 she is going to trip to [place] with her daughter for a family reunion and medication. She wants a wheelchair.  OV 05/26/2016  Chief Complaint  Patient presents with  . Follow-up    Pt states her SOB has slightly worsened since last OV in 02/2016. Pt states her cough is at baseline - with little mucus production with yellow to green mucus. Pt states her appetite has decreased. Pt denies CP/tightness and f/c/s.    [place] 3- 4 3. Chronic hypoxemic respilure.Presents for routine follow-up. She visited [place], [place] for a family reunion in August 2017. This went well without any exacerbation. Overall she is doing well. Shortness of breath and cough is at baseline. There no new issues. She now has a wheelchair. She uses 3 L of oxygen at baseline but sometimes s2    OV 09/02/2016  Chief Complaint  Patient presents with  . Follow-up    Pt states overall her breathing is unchanged. Pt c/o hoarseness. Pt deneis  significant cough and CP/tightness.    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 Presents for routine follow-up. At this point in time COPD stable. She is compliant with the Spiriva and Symbicort. She is 3 L of oxygen at baseline. She's undergoing grief counseling because of her sister's death. There are no new issues she is up-to-date with flu shot. She wants me to fill out some financial assistance form for a Symbicort    OV 12/31/2016  Chief Complaint  Patient presents with  . Follow-up    Pt states her SOB has slightly more SOB. Pt c/o prod cough with little mucus - green in color. Pt denies CP/tightness.    Follow-up Gold stage IV COPD. She is on triple inhaler therapy with Spiriva and Symbicort. She says this is working well for her. There are no new issues. She feels COPD is best ever. She recently in April 2018 separated from her husband and she feels emotionally better as a result. CAT score is 27 and is baseline. USes o2 3L [place]   OV 03/09/2017  Chief Complaint  Patient presents with  . Acute Visit    Pt was given abx and pred but states she only slightly feels improved. Pt c/o SOB and prod cough with yellow mucus and chest tightness. Pt denies f/c/s.    Gold stage IV COPD patient.  She is recently separated from her husband is having insurance difficulties. We did help her with sample of triple inhaler therapy combo trelegy/ . She continues with 4 L nasal oxygen at baseline. Mid July 2008 and she went to [place], [place] for a family get together and was exposed to sick people. She called was end of July 2018 and we treated her with 5 days of cephalexin and prednisone for COPD flareup but she's not any better. She still having significant cough and wheezing and white phlegm with occasional yellow tint. She is worried she has pneumonia. Her friend recently buried her son with pneumonia. There is no fever or chills or edema or hemoptysis. In the office she is wheezing  which is a little bit unusual for her.   OV 06/29/2017  Chief Complaint  Patient presents with  . Follow-up    Pt states that she has been doing okay. States that she has an occ. cough, no change in the SOB. Denies any CP.     Follow-up advanced COPD Gold stage IV with chronic hypoxemic restaurant failure.  Last visit she was in exacerbation.  After the exacerbation resolved be tested for hypercapnia but her PCO2 is 45.  Therefore she did not qualify for nocturnal BiPAP.  Currently she feels fine.  COPD CAT score is 27 documented below and is at baseline.  She is on triple inhaler therapy by [place].  She was on the study for this and it worked well for her.  She is also on albuterol as needed chronic oxygen and N-acetylcysteine to prevent COPD exacerbations.  She is interested in [place] to prevent COPD exacerbations.  She is up-to-date with her flu shot.   OV 10/11/2017  Chief Complaint  Patient presents with  . Follow-up    Pt states she has been doing good since last visit. States she has a mild cough with clear mucus. DME: [employer], 4L pulse     Follow-up advanced COPD Gold stage IV with chronic hypoxemic restaurant failure.  Presents for routine follow-up.  She is compliant with her oxygen and nebulizer/inhaler.  No new issues.  COPD CAT score is 30 and reflects a slight uptake in her basic symptom profile.  There is no exacerbation issues.  She tells me that on October 02, 2017 she ended up in the ER with nausea and vomiting.  Review of the lab shows mild increase in her creatinine and she was told she was dehydrated and to drink a lot of fluids.  She says she feels fine now but wants a creatinine rechecked.  She is also frustrated with pulmonary rehabilitation department because she is on maintenance phase of rehabilitation and did not checking her vital signs especially blood pressure.  On exam she seems to have thrush.  OV 01/26/2018  Chief Complaint  Patient presents with  .  Follow-up    Pt states she has been doing okay since last visit. States she has had a cough x2 weeks and was on an abx and prednisone. States she is coughing up green mucus, SOB is worse. Denies any CP.    ollow-up Gold stage IV COPD with chronic hypoxic respiratory failure [normal AVG without hypercapnia August 2018] : since last visit in March 2019 she did not tolerate her roflumilast. . This was confirmed on chart review. Therefore she is on acetylcysteine prophylaxis. Also she had issues with inhaler payments and currently she is on STiolto, med review and talking to her confirmed that she is  not on any inhaled steroid. She continues on oxygen daily. COPD cat score is stable compared to last visit and shows a slow creep up over one year.on exam she has mild wheeze  Last visit she had acute kidney injury that was mild. With hydration be followed this and it did improve although does not back to her baseline from 2018 patient might chart review of the labs today.      OV 04/28/2018  Subjective:  Patient ID: [name], female , DOB: 05/15/1956 , age 11 y.o. , MRN: [id number] , ADDRESS: [address] [place] [address]   04/28/2018 -   Chief Complaint  Patient presents with  . Follow-up    Pt states she has been doing well since last visit and states she even has more energy. Pt states she has mild cough. Denies any CP.     HPI BILLIEJEAN SCHIMEK 62 y.o. - fu ES- COPD. Since June 2019 on nocturnal non-invasive venilation. AFter this improved CAT score due to less dyspnea, chest tightness and fatigue. SEe CAT score. Continues o2, and other meds. Uptodate with flu shot.     OV 07/12/2018  Subjective:  Patient ID: [name], female , DOB: 16-Feb-1956 , age 62 y.o. , MRN: [id number] , ADDRESS: [address] [place] [address]   07/12/2018 -   Chief Complaint  Patient presents with  . Follow-up    pt states breathing is baseline. c/o occ prod cough with  yellowish to white mucus.     HPI OLIVINE HIERS 62 y.o. 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  She continues on nocturnal BiPAP.  This is really improved her symptom score.  COPD CAT score is now 14.  She is on triple inhaler therapy and oxygen as well.  #2019 CT scan of the chest did not show any evidence of lung cancer.  Annual screening is recommended.  Her new issues this visit are that she has unintentional weight loss of 12 pounds.  She is frustrated by this.  She has seen GI and is seeing a primary care physician for this.  She is also asking for travel advice encounter.  She is going to [place] for 4 days for the weekend with her friends to celebrate her recent divorce.  She is asking for preemptive antibiotic and prednisone      CAT COPD Symptom & Quality of Life Score ([place]) 0 is no burden. 5 is highest burden 12/31/2016  03/09/2017  10/11/2017  01/26/2018  04/28/2018 With nigight bipap 07/12/2018 onging bipap qhs  Never Cough -> Cough all the time 3 2 4 3 3 2   No phlegm in chest -> Chest is full of phlegm 3 2 3 4 2 2   No chest tightness -> Chest feels very tight 3 3 3 3  0 0  No dyspnea for 1 flight stairs/hill -> Very dyspneic for 1 flight of stairs 5 5 5 5 4 4   No limitations for ADL at home -> Very limited with ADL at home 5 3 5 5 5 2   Confident leaving home -> Not at all confident leaving home 2 5 3 4 3  0  Sleep soundly -> Do not sleep soundly because of lung condition 2 4 3 4 3 4   Lots of Energy -> No energy at all 4 3 3 3 4  0  TOTAL Score (max 40)  27 27 30 31 24  14  ROS - per HPI     has a past medical history of Anxiety, Arthritis, Chest pain, COPD (chronic obstructive pulmonary disease) ([place]), Depression, Fatigue, GERD (gastroesophageal reflux disease), Hemorrhoids, Hyperlipidemia, Hypertension, On home oxygen therapy, and Palpitations.   reports that she quit smoking about 6 years ago. Her smoking use included  cigarettes. She has a 12.50 pack-year smoking history. She has never used smokeless tobacco.  Past Surgical History:  Procedure Laterality Date  . CHOLECYSTECTOMY     laparosopic attempted, then had to open  . COLONOSCOPY     x2 with polyps removed  . COLONOSCOPY N/A 08/27/2015   Procedure: COLONOSCOPY;  Surgeon: Manus Gunning, MD;  Location: [facility];  Service: Gastroenterology;  Laterality: N/A;  . oopherectomy     1 ovary removed only  . POLYPECTOMY    . SALPINGECTOMY     1 fallopian tube removed    Allergies  Allergen Reactions  . Daliresp [Roflumilast]     Immunization History  Administered Date(s) Administered  . Influenza Split 06/10/2012, 04/03/2014, 04/20/2015  . Influenza, High Dose Seasonal PF 04/09/2018  . Influenza,inj,Quad PF,6+ Mos 04/24/2013, 03/03/2016  . Influenza-Unspecified 05/29/2017  . Pneumococcal Conjugate-13 05/07/2015  . Pneumococcal Polysaccharide-23 06/10/2012  . Zoster 05/10/2014    Family History  Problem Relation Age of Onset  . Heart attack Mother   . Pancreatic cancer Mother   . Prostate cancer Father   . Hypertension Father   . Throat cancer Father   . Throat cancer Brother   . Colon cancer Sister   . Brain cancer Sister   . Stomach cancer Neg Hx      Current Outpatient Medications:  .  Acetylcysteine 600 MG CAPS, Take 1 capsule by mouth 2 (two) times daily., Disp: , Rfl:  .  albuterol (PROVENTIL HFA) 108 (90 Base) MCG/ACT inhaler, Inhale 1-2 puffs into the lungs every 6 (six) hours as needed for wheezing or shortness of breath., Disp: , Rfl:  .  albuterol (PROVENTIL) (2.5 MG/3ML) 0.083% nebulizer solution, Take 3 mLs (2.5 mg total) by nebulization every 4 (four) hours as needed for wheezing or shortness of breath. Dx code j44.9, Disp: 125 mL, Rfl: 6 .  ALPRAZolam (XANAX) 0.5 MG tablet, Take 0.5 mg by mouth 3 (three) times daily as needed for anxiety. , Disp: , Rfl:  .  AMBULATORY NON FORMULARY MEDICATION, VENTILATOR  machine at night, Disp: , Rfl:  .  aspirin EC 81 MG tablet, Take 81 mg by mouth every morning. , Disp: , Rfl:  .  buPROPion (ZYBAN) 150 MG 12 hr tablet, Take 150 mg by mouth daily after breakfast., Disp: , Rfl:  .  cetirizine (ZYRTEC) 10 MG tablet, Take 10 mg by mouth at bedtime., Disp: , Rfl:  .  cholecalciferol (VITAMIN D) 1000 UNITS tablet, Take 1,000 Units by mouth daily., Disp: , Rfl:  .  diltiazem (CARDIZEM CD) 180 MG 24 hr capsule, Take 180 mg by mouth every morning. , Disp: , Rfl:  .  ferrous sulfate 325 (65 FE) MG tablet, Take 325 mg by mouth daily with breakfast., Disp: , Rfl:  .  fluticasone (FLONASE) 50 MCG/ACT nasal spray, Place 2 sprays into both nostrils daily., Disp: 16 g, Rfl: 5 .  Fluticasone Furoate (ARNUITY ELLIPTA) 100 MCG/ACT AEPB, Inhale 1 puff into the lungs daily., Disp: 30 each, Rfl: 5 .  hydrochlorothiazide (HYDRODIURIL) 25 MG tablet, Take 25 mg by mouth every morning. , Disp: , Rfl:  .  metoprolol succinate (TOPROL-XL) 50  MG 24 hr tablet, Take 50 mg by mouth every morning. Take with or immediately following a meal., Disp: , Rfl:  .  mirtazapine (REMERON) 30 MG tablet, Take 1 tablet (30 mg total) by mouth at bedtime., Disp: 30 tablet, Rfl: 3 .  omeprazole (PRILOSEC) 20 MG capsule, Take 20 mg by mouth daily. , Disp: , Rfl:  .  OXYGEN, Inhale 2-3 L into the lungs daily., Disp: , Rfl:  .  pravastatin (PRAVACHOL) 80 MG tablet, Take 80 mg by mouth every morning. , Disp: , Rfl:  .  spironolactone (ALDACTONE) 50 MG tablet, Take 25 mg by mouth every morning. , Disp: , Rfl:  .  temazepam (RESTORIL) 30 MG capsule, TAKE 1 CAPSULE BY MOUTH ONCE DAILY AT BEDTIME AS NEEDED FOR SLEEP, Disp: , Rfl: 4 .  Tiotropium Bromide-Olodaterol (STIOLTO RESPIMAT) 2.5-2.5 MCG/ACT AERS, Inhale 2 puffs into the lungs daily., Disp: 1 Inhaler, Rfl: 3      Objective:   Vitals:   07/12/18 0905  BP: 118/68  Pulse: 87  SpO2: 100%  Weight: 161 lb 3.2 oz (73.1 kg)  Height: 5\' 5"  (1.651 m)     Estimated body mass index is 26.83 kg/m as calculated from the following:   Height as of this encounter: 5\' 5"  (1.651 m).   Weight as of this encounter: 161 lb 3.2 oz (73.1 kg).  @WEIGHTCHANGE @  [employer]   07/12/18 0905  Weight: 161 lb 3.2 oz (73.1 kg)     Physical Exam  General Appearance:    Alert, cooperative, no distress, appears stated age - older , Deconditioned looking - no , OBESE  - no, Sitting on Wheelchair -  no  Head:    Normocephalic, without obvious abnormality, atraumatic  Eyes:    PERRL, conjunctiva/corneas clear,  Ears:    Normal TM's and external ear canals, both ears  Nose:   Nares normal, septum midline, mucosa normal, no drainage    or sinus tenderness. OXYGEN ON  - yes . Patient is @ few L   Throat:   Lips, mucosa, and tongue normal; teeth and gums normal. Cyanosis on lips - no  Neck:   Supple, symmetrical, trachea midline, no adenopathy;    thyroid:  no enlargement/tenderness/nodules; no carotid   bruit or JVD  Back:     Symmetric, no curvature, ROM normal, no CVA tenderness  Lungs:     Distress - no , Wheeze no, Barrell Chest - yesy, Purse lip breathing - yes, Crackles - no   Chest Wall:    No tenderness or deformity.    Heart:    Regular rate and rhythm, S1 and S2 normal, no rub   or gallop, Murmur - no  Breast Exam:    NOT DONE  Abdomen:     Soft, non-tender, bowel sounds active all four quadrants,    no masses, no organomegaly. Visceral obesity - mild yes  Genitalia:   NOT DONE  Rectal:   NOT DONE  Extremities:   Extremities - normal, Has Cane - no, Clubbing - no, Edema - no  Pulses:   2+ and symmetric all extremities  Skin:   Stigmata of Connective Tissue Disease - no  Lymph nodes:   Cervical, supraclavicular, and axillary nodes normal  Psychiatric:  Neurologic:   Pleasant - yes, Anxious - no, Flat affect - no  CAm-ICU - neg, Alert and Oriented x 3 - yes, Moves all 4s - yes, Speech - normal, Cognition - intact  Assessment:       ICD-10-CM   1. Stage 4 very severe COPD by GOLD classification ([place]) J44.9   2. Chronic respiratory failure with hypoxia (HCC) J96.11   3. Weight loss, unintentional R63.4   4. Travel advice encounter Z71.84        Plan:     Patient Instructions  Stage 4 very severe COPD by GOLD classification ([place]) Chronic respiratory failure with hypoxia ([place])   - stable disease with improved symptoms following night bipap - continue o2, flonase, arnuity, stiolto scheduled    Weight loss, unintentional -no evidence of cancer on CT chest nov 2019  - please discuss with PCP Leanna Battles, MD  - if no casue found then we might have to decide is due to copd   Travel advice encounter - for travel to [place] take a pack of   - Take doxycycline 100mg  po twice daily x 5 days; take after meals and avoid sunlight  - Please take prednisone 40 mg x1 day, then 30 mg x1 day, then 20 mg x1 day, then 10 mg x1 day, and then 5 mg x1 day and stop   Followup - 3 months or sooner if needed        SIGNATURE    Dr. Brand Males, M.D., F.C.C.P,  Pulmonary and Critical Care Medicine Staff Physician, [place] Director - Interstitial Lung Disease  Program  Pulmonary [facility] at [place], [place], [address]  Pager: [phone], If no answer or between  15:00h - 7:00h: call [phone] Telephone: [phone]  9:24 AM 07/12/2018

## 2018-07-12 NOTE — Patient Instructions (Addendum)
Stage 4 very severe COPD by GOLD classification ([place]) Chronic respiratory failure with hypoxia (HCC)   - stable disease with improved symptoms following night bipap - continue o2, flonase, arnuity, stiolto scheduled    Weight loss, unintentional -no evidence of cancer on CT chest nov 2019  - please discuss with PCP Leanna Battles, MD  - if no casue found then we might have to decide is due to copd   Travel advice encounter - for travel to [place] take a pack of   - Take doxycycline 100mg  po twice daily x 5 days; take after meals and avoid sunlight  - Please take prednisone 40 mg x1 day, then 30 mg x1 day, then 20 mg x1 day, then 10 mg x1 day, and then 5 mg x1 day and stop   Followup - 3 months or sooner if needed

## 2018-07-14 ENCOUNTER — Encounter (HOSPITAL_COMMUNITY): Payer: Self-pay

## 2018-07-19 ENCOUNTER — Encounter (HOSPITAL_COMMUNITY)
Admission: RE | Admit: 2018-07-19 | Discharge: 2018-07-19 | Disposition: A | Discharge status: Home / Self Care | Payer: Self-pay | Source: Ambulatory Visit | Attending: Internal Medicine | Admitting: Internal Medicine

## 2018-07-21 ENCOUNTER — Encounter (HOSPITAL_COMMUNITY): Payer: Self-pay

## 2018-07-25 DIAGNOSIS — H524 Presbyopia: Secondary | ICD-10-CM | POA: Diagnosis not present

## 2018-07-26 ENCOUNTER — Encounter (HOSPITAL_COMMUNITY): Payer: Self-pay

## 2018-07-28 ENCOUNTER — Encounter (HOSPITAL_COMMUNITY): Payer: Self-pay

## 2018-07-31 DIAGNOSIS — J449 Chronic obstructive pulmonary disease, unspecified: Secondary | ICD-10-CM | POA: Diagnosis not present

## 2018-07-31 DIAGNOSIS — J9611 Chronic respiratory failure with hypoxia: Secondary | ICD-10-CM | POA: Diagnosis not present

## 2018-08-02 ENCOUNTER — Encounter (HOSPITAL_COMMUNITY): Payer: Self-pay

## 2018-08-04 ENCOUNTER — Encounter (HOSPITAL_COMMUNITY): Payer: Self-pay

## 2018-08-04 ENCOUNTER — Telehealth: Payer: Self-pay | Admitting: Internal Medicine

## 2018-08-04 DIAGNOSIS — J42 Unspecified chronic bronchitis: Secondary | ICD-10-CM | POA: Insufficient documentation

## 2018-08-04 NOTE — Telephone Encounter (Signed)
Call made to patient, she states she received a letter from [facility] stating they needed a prescription for stiolto to continue processing her patient assistance application for [id number]. Patient aware MR will not be back in the office until next week. Patient okay with waiting until next week to have script signed then. Will route message to MR and Raquel Sarna for F/U.    MR please advise.

## 2018-08-05 NOTE — Telephone Encounter (Signed)
MR back in office 08/09/18.

## 2018-08-06 DIAGNOSIS — J449 Chronic obstructive pulmonary disease, unspecified: Secondary | ICD-10-CM | POA: Diagnosis not present

## 2018-08-09 ENCOUNTER — Encounter (HOSPITAL_COMMUNITY)
Admission: RE | Admit: 2018-08-09 | Discharge: 2018-08-09 | Disposition: A | Discharge status: Home / Self Care | Payer: Self-pay | Source: Ambulatory Visit | Attending: Internal Medicine | Admitting: Internal Medicine

## 2018-08-10 MED ORDER — TIOTROPIUM BROMIDE-OLODATEROL 2.5-2.5 MCG/ACT IN AERS: 2.0000 | INHALATION_SPRAY | Freq: Every day | RESPIRATORY_TRACT | 3 refills | Status: DC

## 2018-08-10 NOTE — Telephone Encounter (Signed)
Stiolto Rx has been printed and signed by MR and will be faxed to [employer] for pt.

## 2018-08-10 NOTE — Telephone Encounter (Signed)
Katie Greene, please advise if this has been taken care of.

## 2018-08-10 NOTE — Telephone Encounter (Signed)
Called and spoke with pt letting her know this had been done. Pt expressed understanding. Nothing further needed.

## 2018-08-11 ENCOUNTER — Encounter (HOSPITAL_COMMUNITY)
Admission: RE | Admit: 2018-08-11 | Discharge: 2018-08-11 | Disposition: A | Discharge status: Home / Self Care | Payer: Self-pay | Source: Ambulatory Visit | Attending: Internal Medicine | Admitting: Internal Medicine

## 2018-08-16 ENCOUNTER — Telehealth: Payer: Self-pay | Admitting: Internal Medicine

## 2018-08-16 ENCOUNTER — Encounter (HOSPITAL_COMMUNITY)
Admission: RE | Admit: 2018-08-16 | Discharge: 2018-08-16 | Disposition: A | Discharge status: Home / Self Care | Payer: Self-pay | Source: Ambulatory Visit | Attending: Internal Medicine | Admitting: Internal Medicine

## 2018-08-16 NOTE — Telephone Encounter (Signed)
Patient sent a e mail for disability paperwork to be faxed to [place] at [phone].

## 2018-08-16 NOTE — Telephone Encounter (Signed)
Disability paperwork given to [facility]. Routing to [place] for her to follow up on.

## 2018-08-16 NOTE — Telephone Encounter (Signed)
Will route this over to [facility] to follow up on.

## 2018-08-17 NOTE — Telephone Encounter (Signed)
Rec'd uncompleted paperwork from [place] to [place] via [employer] -pr

## 2018-08-18 ENCOUNTER — Encounter (HOSPITAL_COMMUNITY)
Admission: RE | Admit: 2018-08-18 | Discharge: 2018-08-18 | Disposition: A | Discharge status: Home / Self Care | Payer: Self-pay | Source: Ambulatory Visit | Attending: Internal Medicine | Admitting: Internal Medicine

## 2018-08-22 ENCOUNTER — Telehealth: Payer: Self-pay | Admitting: Internal Medicine

## 2018-08-22 NOTE — Telephone Encounter (Signed)
Message will be routed to [facility] for follow up.

## 2018-08-22 NOTE — Telephone Encounter (Signed)
I have obtained the forms. Will have MR sign tomorrow, 08/23/2018 when back in clinic and then will fax/mail paperwork for pt.

## 2018-08-22 NOTE — Telephone Encounter (Signed)
Patient left forms for Prescription assistance. They were placed in Dr. Golden Pop box//fmb

## 2018-08-23 ENCOUNTER — Encounter (HOSPITAL_COMMUNITY)
Admission: RE | Admit: 2018-08-23 | Discharge: 2018-08-23 | Disposition: A | Discharge status: Home / Self Care | Payer: Self-pay | Source: Ambulatory Visit | Attending: Internal Medicine | Admitting: Internal Medicine

## 2018-08-23 NOTE — Telephone Encounter (Signed)
MR in office 08/23/18.  Katie Greene to follow up. 

## 2018-08-24 NOTE — Telephone Encounter (Signed)
Pt's application was faxed today for pt after it had been signed by MR. I am also going to mail it in for pt in case something happens with the fax. Nothing further needed.

## 2018-08-24 NOTE — Telephone Encounter (Signed)
Katie Greene has this been signed and is there anything we can do to help. Thanks.

## 2018-08-25 ENCOUNTER — Encounter (HOSPITAL_COMMUNITY): Payer: Self-pay

## 2018-08-29 ENCOUNTER — Telehealth: Payer: Self-pay | Admitting: Internal Medicine

## 2018-08-29 MED ORDER — PREDNISONE 10 MG PO TABS: ORAL_TABLET | ORAL | 0 refills | Status: DC

## 2018-08-29 MED ORDER — DOXYCYCLINE HYCLATE 100 MG PO TABS: 100.0000 mg | ORAL_TABLET | Freq: Two times a day (BID) | ORAL | 0 refills | Status: DC

## 2018-08-29 NOTE — Telephone Encounter (Signed)
Called and spoke with Patient.  MR recommendations given.  Understanding stated.  Prescriptions sent to [facility].  Nothing further at this time.  Per MR- Take doxycycline 100mg  po twice daily x 5 days; take after meals and avoid sunlight  Please take prednisone 40 mg x1 day, then 30 mg x1 day, then 20 mg x1 day, then 10 mg x1 day, and then 5 mg x1 day and stop

## 2018-08-29 NOTE — Telephone Encounter (Signed)
Pt wants Rx sent to pharmacy for cough-dry at first-Saturday cough became productive-white to yellow in color now. Runny nose-no color. Denies any fever or chills at this time.    Ramaswamy please advise. Thanks.   Current Outpatient Medications on File Prior to Visit  Medication Sig Dispense Refill  . Acetylcysteine 600 MG CAPS Take 1 capsule by mouth 2 (two) times daily.    Marland Kitchen albuterol (PROVENTIL HFA) 108 (90 Base) MCG/ACT inhaler Inhale 1-2 puffs into the lungs every 6 (six) hours as needed for wheezing or shortness of breath.    Marland Kitchen albuterol (PROVENTIL) (2.5 MG/3ML) 0.083% nebulizer solution Take 3 mLs (2.5 mg total) by nebulization every 4 (four) hours as needed for wheezing or shortness of breath. Dx code j44.9 125 mL 6  . ALPRAZolam (XANAX) 0.5 MG tablet Take 0.5 mg by mouth 3 (three) times daily as needed for anxiety.     . AMBULATORY NON FORMULARY MEDICATION VENTILATOR machine at night    . aspirin EC 81 MG tablet Take 81 mg by mouth every morning.     Marland Kitchen buPROPion (ZYBAN) 150 MG 12 hr tablet Take 150 mg by mouth daily after breakfast.    . cetirizine (ZYRTEC) 10 MG tablet Take 10 mg by mouth at bedtime.    . cholecalciferol (VITAMIN D) 1000 UNITS tablet Take 1,000 Units by mouth daily.    Marland Kitchen diltiazem (CARDIZEM CD) 180 MG 24 hr capsule Take 180 mg by mouth every morning.     Marland Kitchen doxycycline (VIBRA-TABS) 100 MG tablet Take 1 tablet (100 mg total) by mouth 2 (two) times daily. 10 tablet 0  . ferrous sulfate 325 (65 FE) MG tablet Take 325 mg by mouth daily with breakfast.    . fluticasone (FLONASE) 50 MCG/ACT nasal spray Place 2 sprays into both nostrils daily. 16 g 5  . Fluticasone Furoate (ARNUITY ELLIPTA) 100 MCG/ACT AEPB Inhale 1 puff into the lungs daily. 30 each 5  . hydrochlorothiazide (HYDRODIURIL) 25 MG tablet Take 25 mg by mouth every morning.     . metoprolol succinate (TOPROL-XL) 50 MG 24 hr tablet Take 50 mg by mouth every morning. Take with or immediately following a meal.    .  mirtazapine (REMERON) 30 MG tablet Take 1 tablet (30 mg total) by mouth at bedtime. 30 tablet 3  . omeprazole (PRILOSEC) 20 MG capsule Take 20 mg by mouth daily.     . OXYGEN Inhale 2-3 L into the lungs daily.    . pravastatin (PRAVACHOL) 80 MG tablet Take 80 mg by mouth every morning.     . predniSONE (DELTASONE) 10 MG tablet 4 tabs x 1 day, 3 tabs x 1 day, 2 tab x 1 day, 1 tab x 1 day, 0.5 tab x 1 day then stop 11 tablet 0  . spironolactone (ALDACTONE) 50 MG tablet Take 25 mg by mouth every morning.     . temazepam (RESTORIL) 30 MG capsule TAKE 1 CAPSULE BY MOUTH ONCE DAILY AT BEDTIME AS NEEDED FOR SLEEP  4  . Tiotropium Bromide-Olodaterol (STIOLTO RESPIMAT) 2.5-2.5 MCG/ACT AERS Inhale 2 puffs into the lungs daily. 1 Inhaler 3   No current facility-administered medications on file prior to visit.

## 2018-08-29 NOTE — Telephone Encounter (Signed)
Take doxycycline 100mg  po twice daily x 5 days; take after meals and avoid sunlight  Please take prednisone 40 mg x1 day, then 30 mg x1 day, then 20 mg x1 day, then 10 mg x1 day, and then 5 mg x1 day and stop    Allergies  Allergen Reactions  . Daliresp [Roflumilast]

## 2018-08-30 ENCOUNTER — Encounter (HOSPITAL_COMMUNITY): Payer: Self-pay

## 2018-08-30 NOTE — Telephone Encounter (Signed)
I have given the folder to MR for him to complete. Once he returns it back to me, will return back to [place].

## 2018-08-31 DIAGNOSIS — J9611 Chronic respiratory failure with hypoxia: Secondary | ICD-10-CM | POA: Diagnosis not present

## 2018-08-31 DIAGNOSIS — J449 Chronic obstructive pulmonary disease, unspecified: Secondary | ICD-10-CM | POA: Diagnosis not present

## 2018-08-31 NOTE — Telephone Encounter (Signed)
Folder was returned to [facility], 08/30/2018 after it was filled out/signed by MR. Nothing further needed.

## 2018-08-31 NOTE — Telephone Encounter (Signed)
Rec'd completed paperwork - Fwd to [employer] via interoffice mail -pr  °

## 2018-09-01 ENCOUNTER — Encounter (HOSPITAL_COMMUNITY): Payer: Self-pay

## 2018-09-05 ENCOUNTER — Telehealth: Payer: Self-pay | Admitting: Internal Medicine

## 2018-09-05 NOTE — Telephone Encounter (Signed)
Called and spoke with pt letting her know that the disability paperwork was completed for her and returned back to company 08/31/2018. Pt expressed understanding. Nothing further needed.

## 2018-09-06 ENCOUNTER — Encounter (HOSPITAL_COMMUNITY)
Admission: RE | Admit: 2018-09-06 | Discharge: 2018-09-06 | Disposition: A | Discharge status: Home / Self Care | Payer: Self-pay | Source: Ambulatory Visit | Attending: Internal Medicine | Admitting: Internal Medicine

## 2018-09-06 DIAGNOSIS — J42 Unspecified chronic bronchitis: Secondary | ICD-10-CM | POA: Insufficient documentation

## 2018-09-06 DIAGNOSIS — J449 Chronic obstructive pulmonary disease, unspecified: Secondary | ICD-10-CM | POA: Diagnosis not present

## 2018-09-08 ENCOUNTER — Encounter: Payer: Self-pay | Admitting: Gastroenterology

## 2018-09-08 ENCOUNTER — Encounter (HOSPITAL_COMMUNITY): Admission: RE | Admit: 2018-09-08 | Payer: Self-pay | Source: Ambulatory Visit

## 2018-09-12 ENCOUNTER — Other Ambulatory Visit: Payer: Self-pay

## 2018-09-12 ENCOUNTER — Telehealth: Payer: Self-pay | Admitting: Gastroenterology

## 2018-09-12 MED ORDER — HYDROCORTISONE 1 % EX OINT: TOPICAL_OINTMENT | CUTANEOUS | 0 refills | Status: DC

## 2018-09-12 NOTE — Telephone Encounter (Signed)
Agree with your recommendations. She can try some 1% hydrocortisone cream and apply pea sized amount into distal rectum as needed to see if that helps as well. If she has any constipation that needs to be treated with either Miralax or fiber supplements. Will await office follow up otherwise. Thanks

## 2018-09-12 NOTE — Telephone Encounter (Signed)
Patient instructed.

## 2018-09-12 NOTE — Telephone Encounter (Signed)
Patient stopped by the office. She has a prolapsed hemorrhoid that she said "popped out last night." It has begun causing her discomfort. She wants to have a bowel movement, but is afraid of making the hemorrhoid worse. Confirmed the actual passage of stool is not painful, but the hemorrhoid itself.  I have given her samples of Recticare and Preparation H wipes. Please advise. She states she has used suppositories in the past. She has an appointment with Nicoletta Ba, [place] on 2/19 to discuss colonoscopy.

## 2018-09-13 ENCOUNTER — Encounter (HOSPITAL_COMMUNITY): Payer: Self-pay

## 2018-09-14 ENCOUNTER — Telehealth: Payer: Self-pay | Admitting: Internal Medicine

## 2018-09-14 NOTE — Telephone Encounter (Signed)
Called patient, unable to reach and phone continued to ring busy signal. Will call back.

## 2018-09-15 ENCOUNTER — Encounter (HOSPITAL_COMMUNITY): Payer: Self-pay

## 2018-09-15 NOTE — Telephone Encounter (Signed)
Called and spoke with patient, she stated that this has been taken care of. She was able to have the forms emailed to her, signed and emailed back. Nothing further needed.

## 2018-09-20 ENCOUNTER — Encounter (HOSPITAL_COMMUNITY)
Admission: RE | Admit: 2018-09-20 | Discharge: 2018-09-20 | Disposition: A | Discharge status: Home / Self Care | Payer: Self-pay | Source: Ambulatory Visit | Attending: Internal Medicine | Admitting: Internal Medicine

## 2018-09-21 ENCOUNTER — Ambulatory Visit: Payer: Medicare HMO | Admitting: Physician Assistant

## 2018-09-21 ENCOUNTER — Encounter: Payer: Self-pay | Admitting: Physician Assistant

## 2018-09-21 VITALS — BP 138/80 | HR 96 | Ht 65.0 in | Wt 164.6 lb

## 2018-09-21 DIAGNOSIS — J449 Chronic obstructive pulmonary disease, unspecified: Secondary | ICD-10-CM

## 2018-09-21 DIAGNOSIS — Z86010 Personal history of colonic polyps: Secondary | ICD-10-CM | POA: Diagnosis not present

## 2018-09-21 DIAGNOSIS — Z800 Family history of malignant neoplasm of digestive organs: Secondary | ICD-10-CM

## 2018-09-21 NOTE — Patient Instructions (Signed)
We will contact you to schedule your Colonoscopy for April when the schedule comes out. We have given you a sample of Suprep for you to use for the Colonoscopy.

## 2018-09-21 NOTE — Progress Notes (Signed)
Subjective:    Patient ID: Katie Greene, female    DOB: 25-Jan-1956, 63 y.o.   MRN: [id number]  HPI Katie Greene is a pleasant 63 year old African-American female, known to Dr. Havery Moros who comes in today to discuss colonoscopy and possible EGD. Patient was last seen in the office in September 2019. She has history of very severe COPD with chronic respiratory failure requiring 3 L of nasal cannula continuously.  She is now on BiPAP at night. She has history of adenomatous colon polyps and family history of colon cancer in her sister. Last colonoscopy was done in January 2017 at which time she had 3 polyps removed, was noted to have a somewhat tortuous colon there was a lipoma in the a sending colon.  Biopsies were consistent with tubular adenomas.  Also noted to have internal hemorrhoids.  She was indicated to follow-up in 3 years. Patient had a weight loss of about 20 pounds over a year or so.  Since her last office visit in September she has actually gained 8 pounds.  She says her appetite is fairly good.  She denies any abdominal discomfort, no problems with bowel movements though on occasion does have hard stools.  She denies any nausea vomiting dysphagia or epigastric pain. She had called here within the past 2 weeks with complaints of symptomatic hemorrhoids after having some constipation.  She did not notice any bleeding ills that she had an internal hemorrhoid which became inflamed and protruded out.  She had been on an iron supplement which she stopped and says her constipation is much better she is also taking MiraLAX now on a daily basis.  She had been called in a hydrocortisone cream and that has been quite helpful.  Review of Systems Pertinent positive and negative review of systems were noted in the above HPI section.  All other review of systems was otherwise negative.  Outpatient Encounter Medications as of 09/21/2018  Medication Sig  . Acetylcysteine 600 MG CAPS Take 1 capsule by  mouth 2 (two) times daily.  Marland Kitchen albuterol (PROVENTIL HFA) 108 (90 Base) MCG/ACT inhaler Inhale 1-2 puffs into the lungs every 6 (six) hours as needed for wheezing or shortness of breath.  Marland Kitchen albuterol (PROVENTIL) (2.5 MG/3ML) 0.083% nebulizer solution Take 3 mLs (2.5 mg total) by nebulization every 4 (four) hours as needed for wheezing or shortness of breath. Dx code j44.9  . ALPRAZolam (XANAX) 0.5 MG tablet Take 0.5 mg by mouth 3 (three) times daily as needed for anxiety.   . AMBULATORY NON FORMULARY MEDICATION VENTILATOR machine at night  . aspirin EC 81 MG tablet Take 81 mg by mouth every morning.   Marland Kitchen buPROPion (ZYBAN) 150 MG 12 hr tablet Take 150 mg by mouth daily after breakfast.  . cetirizine (ZYRTEC) 10 MG tablet Take 10 mg by mouth at bedtime.  . cholecalciferol (VITAMIN D) 1000 UNITS tablet Take 1,000 Units by mouth daily.  Marland Kitchen diltiazem (CARDIZEM CD) 180 MG 24 hr capsule Take 180 mg by mouth every morning.   . ferrous sulfate 325 (65 FE) MG tablet Take 325 mg by mouth daily with breakfast.  . fluticasone (FLONASE) 50 MCG/ACT nasal spray Place 2 sprays into both nostrils daily.  . Fluticasone Furoate (ARNUITY ELLIPTA) 100 MCG/ACT AEPB Inhale 1 puff into the lungs daily.  . hydrochlorothiazide (HYDRODIURIL) 25 MG tablet Take 25 mg by mouth every morning.   . hydrocortisone 1 % ointment Pea size amount into rectum twice daily  . metoprolol succinate (TOPROL-XL)  50 MG 24 hr tablet Take 50 mg by mouth every morning. Take with or immediately following a meal.  . mirtazapine (REMERON) 30 MG tablet Take 1 tablet (30 mg total) by mouth at bedtime.  Marland Kitchen omeprazole (PRILOSEC) 20 MG capsule Take 20 mg by mouth daily.   . OXYGEN Inhale 2-3 L into the lungs daily.  . pravastatin (PRAVACHOL) 80 MG tablet Take 80 mg by mouth every morning.   Marland Kitchen spironolactone (ALDACTONE) 50 MG tablet Take 25 mg by mouth every morning.   . temazepam (RESTORIL) 30 MG capsule TAKE 1 CAPSULE BY MOUTH ONCE DAILY AT BEDTIME AS  NEEDED FOR SLEEP  . Tiotropium Bromide-Olodaterol (STIOLTO RESPIMAT) 2.5-2.5 MCG/ACT AERS Inhale 2 puffs into the lungs daily.  . [DISCONTINUED] doxycycline (VIBRA-TABS) 100 MG tablet Take 1 tablet (100 mg total) by mouth 2 (two) times daily. (Patient not taking: Reported on 09/21/2018)  . [DISCONTINUED] doxycycline (VIBRA-TABS) 100 MG tablet Take 1 tablet (100 mg total) by mouth 2 (two) times daily.  . [DISCONTINUED] predniSONE (DELTASONE) 10 MG tablet 4 tabs x 1 day, 3 tabs x 1 day, 2 tab x 1 day, 1 tab x 1 day, 0.5 tab x 1 day then stop  . [DISCONTINUED] predniSONE (DELTASONE) 10 MG tablet 40mg x1day,30mg x1day,20mg x1day,10mg x1day,5mg x1day   No facility-administered encounter medications on file as of 09/21/2018.    Allergies  Allergen Reactions  . Daliresp [Roflumilast]    Patient Active Problem List   Diagnosis Date Noted  . Blood in stool   . History of colonic polyps   . Dyspnea and respiratory abnormality 12/12/2014  . Patient in clinical research study 08/23/2014  . COPD, severe ([place]) 08/23/2014  . Research exam 01/04/2014  . Leg cramps 09/28/2013  . Chronic respiratory failure ([place]) 09/28/2013  . Postnasal drip 12/09/2012  . Hemoptysis 09/22/2012  . COPD exacerbation ([place]) 09/22/2012  . History of smoking 09/22/2012  . Smoking history 06/29/2012  . Cancer screening 06/29/2012  . COPD (chronic obstructive pulmonary disease) ([place]) 06/12/2012   Social History   Socioeconomic History  . Marital status: Married    Spouse name: Not on file  . Number of children: 3  . Years of education: Not on file  . Highest education level: Not on file  Occupational History  . Occupation: Retired     Comment: Pharmacologist  . Financial resource strain: Not on file  . Food insecurity:    Worry: Not on file    Inability: Not on file  . Transportation needs:    Medical: Not on file    Non-medical: Not on file  Tobacco Use  . Smoking status: Former Smoker    Packs/day: 0.50     Years: 25.00    Pack years: 12.50    Types: Cigarettes    Last attempt to quit: 03/02/2012    Years since quitting: 6.5  . Smokeless tobacco: Never Used  . Tobacco comment: pt states she smoked some and stopped again 8-13.  Substance and Sexual Activity  . Alcohol use: No  . Drug use: No  . Sexual activity: Not on file  Lifestyle  . Physical activity:    Days per week: Not on file    Minutes per session: Not on file  . Stress: Not on file  Relationships  . Social connections:    Talks on phone: Not on file    Gets together: Not on file    Attends religious service: Not on file    Active member  of club or organization: Not on file    Attends meetings of clubs or organizations: Not on file    Relationship status: Not on file  . Intimate partner violence:    Fear of current or ex partner: Not on file    Emotionally abused: Not on file    Physically abused: Not on file    Forced sexual activity: Not on file  Other Topics Concern  . Not on file  Social History Narrative  . Not on file    Ms. Salley's family history includes Brain cancer in her sister; Colon cancer in her sister; Heart attack in her mother; Hypertension in her father; Pancreatic cancer in her mother; Prostate cancer in her father; Throat cancer in her brother and father.      Objective:    Vitals:   09/21/18 0951  BP: 138/80  Pulse: 96  SpO2: 94%    Physical Exam; well-developed older African-American female in no acute distress, she is on portable oxygen at 3 L nasal cannula.  Height 5 foot 5, weight 164, BMI 27.3.  HEENT nontraumatic normocephalic EOMI PERRLA sclera anicteric oral mucosa moist, Cardiovascular regular rate and rhythm with S1-S2.  Pulmonary decreased breath sounds bilaterally, Abdomen soft, nondistended, nontender bowel sounds are present no palpable mass or hepatosplenomegaly.  Rectal exam not done, Extremities no clubbing cyanosis or edema skin warm and dry, Neuropsych alert and oriented,  grossly nonfocal mood and affect appropriate       Assessment & Plan:   #64 63 year old African-American female with very severe gold stage IV COPD with chronic hypoxic respiratory failure, on chronic oxygen and now on BiPAP at night who comes in to discuss follow-up colonoscopy. Patient has personal history of tubular adenomatous polyps with 3 polyps removed at last colonoscopy January 2017 the largest 10 mm. Also with family history of colon cancer in her sister.  #2 recent exacerbation of symptomatic internal hemorrhoids-improved #3 constipation #4 weight loss-that occurred for many months all stabilized and weight actually up 6 pounds since last office visit.  Patient has no worrisome specific GI symptoms suspect weight loss secondary to very advanced COPD.  Plan; Patient is high risk for sedation given severe COPD.  She has not had any procedures requiring sedation since she last had colonoscopy. Pilar Plate discussion with her today regarding increased risk of complications with sedation for colonoscopy including respiratory failure requiring vent support. Patient  wishes to proceed with colonoscopy.  Procedure will be scheduled with Dr. Havery Moros at [place] [facility]. I see no specific indication for EGD.   S  PA-C 09/21/2018   Cc: No ref. provider found

## 2018-09-22 ENCOUNTER — Encounter (HOSPITAL_COMMUNITY)
Admission: RE | Admit: 2018-09-22 | Discharge: 2018-09-22 | Disposition: A | Discharge status: Home / Self Care | Payer: Self-pay | Source: Ambulatory Visit | Attending: Internal Medicine | Admitting: Internal Medicine

## 2018-09-22 NOTE — Progress Notes (Signed)
Agree with assessment and plan as outlined.  

## 2018-09-26 ENCOUNTER — Other Ambulatory Visit: Payer: Self-pay | Admitting: Gastroenterology

## 2018-09-26 NOTE — Telephone Encounter (Signed)
Katie Greene - you saw this pt last week. Ok to refill?

## 2018-09-27 ENCOUNTER — Encounter (HOSPITAL_COMMUNITY)
Admission: RE | Admit: 2018-09-27 | Discharge: 2018-09-27 | Disposition: A | Discharge status: Home / Self Care | Payer: Self-pay | Source: Ambulatory Visit | Attending: Internal Medicine | Admitting: Internal Medicine

## 2018-09-27 ENCOUNTER — Telehealth: Payer: Self-pay | Admitting: Gastroenterology

## 2018-09-27 NOTE — Telephone Encounter (Signed)
We did not prescribe Remeron - pharmacy needs to send to her PCP

## 2018-09-27 NOTE — Telephone Encounter (Signed)
Thanks Jan. If she finds it is helping her appetite and makes her feel better, we can refill it. Thanks

## 2018-09-27 NOTE — Telephone Encounter (Signed)
Dr. Havery Moros - you Prescribed this in 05-2018.  Amy saw her this month but this was not addressed. Please advise. thanks

## 2018-09-27 NOTE — Telephone Encounter (Signed)
Pt would like a refill for mirtazapine (REMERON) [place], [place].

## 2018-09-27 NOTE — Telephone Encounter (Signed)
Called and spoke to pt. Let her know refill request has been sent to Dr. Havery Moros and we will let her know this afternoon since he is with patients.  She expressed understanding.

## 2018-09-29 ENCOUNTER — Encounter (HOSPITAL_COMMUNITY)
Admission: RE | Admit: 2018-09-29 | Discharge: 2018-09-29 | Disposition: A | Discharge status: Home / Self Care | Payer: Self-pay | Source: Ambulatory Visit | Attending: Internal Medicine | Admitting: Internal Medicine

## 2018-10-01 DIAGNOSIS — J449 Chronic obstructive pulmonary disease, unspecified: Secondary | ICD-10-CM | POA: Diagnosis not present

## 2018-10-01 DIAGNOSIS — J9611 Chronic respiratory failure with hypoxia: Secondary | ICD-10-CM | POA: Diagnosis not present

## 2018-10-04 ENCOUNTER — Encounter (HOSPITAL_COMMUNITY)
Admission: RE | Admit: 2018-10-04 | Discharge: 2018-10-04 | Disposition: A | Discharge status: Home / Self Care | Payer: Self-pay | Source: Ambulatory Visit | Attending: Internal Medicine | Admitting: Internal Medicine

## 2018-10-04 DIAGNOSIS — J42 Unspecified chronic bronchitis: Secondary | ICD-10-CM | POA: Insufficient documentation

## 2018-10-05 DIAGNOSIS — J449 Chronic obstructive pulmonary disease, unspecified: Secondary | ICD-10-CM | POA: Diagnosis not present

## 2018-10-06 ENCOUNTER — Encounter (HOSPITAL_COMMUNITY)
Admission: RE | Admit: 2018-10-06 | Discharge: 2018-10-06 | Disposition: A | Discharge status: Home / Self Care | Payer: Self-pay | Source: Ambulatory Visit | Attending: Internal Medicine | Admitting: Internal Medicine

## 2018-10-11 ENCOUNTER — Encounter (HOSPITAL_COMMUNITY)
Admission: RE | Admit: 2018-10-11 | Discharge: 2018-10-11 | Disposition: A | Discharge status: Home / Self Care | Payer: Self-pay | Source: Ambulatory Visit | Attending: Internal Medicine | Admitting: Internal Medicine

## 2018-10-11 ENCOUNTER — Telehealth: Payer: Self-pay | Admitting: Internal Medicine

## 2018-10-11 NOTE — Telephone Encounter (Signed)
LVM for pt regarding apt on 10/19/18 with Dr. Chase Caller. Due to Dr. Being called to work in hospital, I have moved appt to same day and time with Derl Barrow. If pt ONLY wants to see Dr. Chase Caller, please reschedule accordingly.

## 2018-10-12 ENCOUNTER — Other Ambulatory Visit: Payer: Self-pay

## 2018-10-12 DIAGNOSIS — Z800 Family history of malignant neoplasm of digestive organs: Secondary | ICD-10-CM

## 2018-10-12 DIAGNOSIS — Z789 Other specified health status: Secondary | ICD-10-CM

## 2018-10-12 DIAGNOSIS — J449 Chronic obstructive pulmonary disease, unspecified: Secondary | ICD-10-CM

## 2018-10-12 DIAGNOSIS — Z86010 Personal history of colonic polyps: Secondary | ICD-10-CM

## 2018-10-13 ENCOUNTER — Other Ambulatory Visit: Payer: Self-pay

## 2018-10-13 ENCOUNTER — Encounter (HOSPITAL_COMMUNITY)
Admission: RE | Admit: 2018-10-13 | Discharge: 2018-10-13 | Disposition: A | Discharge status: Home / Self Care | Payer: Self-pay | Source: Ambulatory Visit | Attending: Internal Medicine | Admitting: Internal Medicine

## 2018-10-14 ENCOUNTER — Telehealth: Payer: Self-pay | Admitting: Internal Medicine

## 2018-10-14 NOTE — Telephone Encounter (Signed)
Called the patient back and she stated she does not have any symptoms, and does not know if anyone around her has been exposed. Advised her that if she is in doubt to stay home. Patient stated she has an appointment next week and will keep it.  Nothing further needed at this time.

## 2018-10-17 ENCOUNTER — Ambulatory Visit: Payer: Medicare HMO | Admitting: Internal Medicine

## 2018-10-17 ENCOUNTER — Telehealth (HOSPITAL_COMMUNITY): Payer: Self-pay

## 2018-10-18 ENCOUNTER — Encounter (HOSPITAL_COMMUNITY): Payer: Self-pay

## 2018-10-18 NOTE — Progress Notes (Signed)
@Patient  ID: Katie Greene, female    DOB: 07-23-1956, 63 y.o.   MRN: [id number]  Chief Complaint  Patient presents with  . Follow-up    follows for COPD    Referring provider: Leanna Battles, MD  HPI: 63 year female, former smoker quit in 2013 (12 pack hx). PMH significant for COPD GOLD IV, chronic respiratory failure on nocturnal BIPAP. Patient of Dr. Chase Caller, last seen 07/12/18. Maintained on Stiolto, Arnuity and continuous oxygen.   10/19/2018 Patient presents today for 3 month follow-up. She is doing well, no acute complaints. States that her breathing is better since she has been on the bipap. Has more energy. Taking inhalers as prescribed. Asking if she can have antibiotic/steriod to have on hand in case of COPD exacerbation. Discussed symptoms to monitor for during [place] outbreak. Advised patient practice social distancing. Denies fever, cough, sob. No travel.    Allergies  Allergen Reactions  . Daliresp [Roflumilast]     Immunization History  Administered Date(s) Administered  . Influenza Split 06/10/2012, 04/03/2014, 04/20/2015  . Influenza, High Dose Seasonal PF 04/09/2018  . Influenza,inj,Quad PF,6+ Mos 04/24/2013, 03/03/2016  . Influenza-Unspecified 05/29/2017  . Pneumococcal Conjugate-13 05/07/2015  . Pneumococcal Polysaccharide-23 06/10/2012  . Zoster 05/10/2014    Past Medical History:  Diagnosis Date  . Anxiety   . Arthritis   . Chest pain    Improved  . 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 Depression   . Fatigue   . GERD (gastroesophageal reflux disease)   . Hemorrhoids   . Hyperlipidemia   . Hypertension   . On home oxygen therapy    oxygen therapy 2-3 L/m nasally 24/7  . Palpitations     Tobacco History: Social History   Tobacco Use  Smoking Status Former Smoker  . Packs/day: 0.50  . Years: 25.00  . Pack years: 12.50  . Types: Cigarettes  . Last attempt to quit: 03/02/2012  . Years since  quitting: 6.6  Smokeless Tobacco Never Used  Tobacco Comment   pt states she smoked some and stopped again 8-13.   Counseling given: Not Answered Comment: pt states she smoked some and stopped again 8-13.   Outpatient Medications Prior to Visit  Medication Sig Dispense Refill  . Acetylcysteine 600 MG CAPS Take 1 capsule by mouth 2 (two) times daily.    Marland Kitchen albuterol (PROVENTIL HFA) 108 (90 Base) MCG/ACT inhaler Inhale 1-2 puffs into the lungs every 6 (six) hours as needed for wheezing or shortness of breath.    Marland Kitchen albuterol (PROVENTIL) (2.5 MG/3ML) 0.083% nebulizer solution Take 3 mLs (2.5 mg total) by nebulization every 4 (four) hours as needed for wheezing or shortness of breath. Dx code j44.9 125 mL 6  . ALPRAZolam (XANAX) 0.5 MG tablet Take 0.5 mg by mouth 3 (three) times daily as needed for anxiety.     . AMBULATORY NON FORMULARY MEDICATION VENTILATOR machine at night    . aspirin EC 81 MG tablet Take 81 mg by mouth every morning.     Marland Kitchen buPROPion (ZYBAN) 150 MG 12 hr tablet Take 150 mg by mouth daily after breakfast.    . cetirizine (ZYRTEC) 10 MG tablet Take 10 mg by mouth at bedtime.    . cholecalciferol (VITAMIN D) 1000 UNITS tablet Take 1,000 Units by mouth daily.    Marland Kitchen diltiazem (CARDIZEM CD) 180 MG 24 hr capsule Take 180 mg by mouth every morning.     . ferrous sulfate  325 (65 FE) MG tablet Take 325 mg by mouth daily with breakfast.    . fluticasone (FLONASE) 50 MCG/ACT nasal spray Place 2 sprays into both nostrils daily. 16 g 5  . Fluticasone Furoate (ARNUITY ELLIPTA) 100 MCG/ACT AEPB Inhale 1 puff into the lungs daily. 30 each 5  . hydrochlorothiazide (HYDRODIURIL) 25 MG tablet Take 25 mg by mouth every morning.     . hydrocortisone 1 % ointment Pea size amount into rectum twice daily 30 g 0  . metoprolol succinate (TOPROL-XL) 50 MG 24 hr tablet Take 50 mg by mouth every morning. Take with or immediately following a meal.    . mirtazapine (REMERON) 30 MG tablet TAKE 1 TABLET BY  MOUTH ONCE DAILY AT BEDTIME 30 tablet 3  . omeprazole (PRILOSEC) 20 MG capsule Take 20 mg by mouth daily.     . OXYGEN Inhale 2-3 L into the lungs daily.    . pravastatin (PRAVACHOL) 80 MG tablet Take 80 mg by mouth every morning.     Marland Kitchen spironolactone (ALDACTONE) 50 MG tablet Take 25 mg by mouth every morning.     . temazepam (RESTORIL) 30 MG capsule TAKE 1 CAPSULE BY MOUTH ONCE DAILY AT BEDTIME AS NEEDED FOR SLEEP  4  . Tiotropium Bromide-Olodaterol (STIOLTO RESPIMAT) 2.5-2.5 MCG/ACT AERS Inhale 2 puffs into the lungs daily. 1 Inhaler 3   No facility-administered medications prior to visit.     Review of Systems  Review of Systems  Constitutional: Negative.   HENT: Negative.   Respiratory: Negative.   Cardiovascular: Negative.     Physical Exam  BP 122/78 (BP Location: Left Arm, Cuff Size: Normal)   Pulse (!) 101   Temp 98.8 F (37.1 C) (Oral)   Ht 5\' 6"  (1.676 m)   Wt 167 lb 3.2 oz (75.8 kg)   SpO2 98%   BMI 26.99 kg/m  Physical Exam Constitutional:      Appearance: She is well-developed.  HENT:     Head: Normocephalic and atraumatic.  Eyes:     Conjunctiva/sclera: Conjunctivae normal.     Pupils: Pupils are equal, round, and reactive to light.  Neck:     Musculoskeletal: Normal range of motion and neck supple.  Cardiovascular:     Rate and Rhythm: Normal rate and regular rhythm.     Heart sounds: Normal heart sounds. No murmur.  Pulmonary:     Effort: Pulmonary effort is normal. No respiratory distress.     Breath sounds: Normal breath sounds. No wheezing.     Comments: CTA Skin:    General: Skin is warm and dry.     Findings: No erythema or rash.  Neurological:     General: No focal deficit present.     Mental Status: She is alert and oriented to person, place, and time. Mental status is at baseline.  Psychiatric:        Mood and Affect: Mood normal.        Behavior: Behavior normal.        Thought Content: Thought content normal.        Judgment:  Judgment normal.      Lab Results:  CBC    Component Value Date/Time   WBC 9.9 10/02/2017 1912   RBC 4.06 10/02/2017 1912   HGB 11.6 (L) 10/02/2017 1912   HCT 36.5 10/02/2017 1912   PLT 307 10/02/2017 1912   MCV 89.9 10/02/2017 1912   MCH 28.6 10/02/2017 1912   MCHC 31.8 10/02/2017 1912  RDW 12.3 10/02/2017 1912   LYMPHSABS 2.3 03/09/2017 1034   MONOABS 1.4 (H) 03/09/2017 1034   EOSABS 0.1 03/09/2017 1034   BASOSABS 0.1 03/09/2017 1034    BMET    Component Value Date/Time   NA 138 01/26/2018 0934   K 4.3 01/26/2018 0934   CL 96 01/26/2018 0934   CO2 33 (H) 01/26/2018 0934   GLUCOSE 166 (H) 01/26/2018 0934   BUN 16 01/26/2018 0934   CREATININE 1.17 01/26/2018 0934   CALCIUM 10.2 01/26/2018 0934   GFRNONAA 43 (L) 10/02/2017 1912   GFRAA 50 (L) 10/02/2017 1912    BNP No results found for: BNP  ProBNP No results found for: PROBNP  Imaging: No results found.   Assessment & Plan:   COPD (chronic obstructive pulmonary disease) - Stable interval, breathing has actually improved since being on nocturnal Bipap - Continue Stiolto and Arnuity - Rx doxycycline and prednisone taper to have on hand in case of COPD exacerbation - FU in 3 months or if symptoms worsen   Chronic respiratory failure - Stable; O2 saturation 98% 3L pulsed today  - Continues oxygen and bipap    Martyn Ehrich, NP 10/19/2018

## 2018-10-19 ENCOUNTER — Other Ambulatory Visit: Payer: Self-pay

## 2018-10-19 ENCOUNTER — Ambulatory Visit (INDEPENDENT_AMBULATORY_CARE_PROVIDER_SITE_OTHER): Payer: Medicare HMO | Admitting: Primary Care

## 2018-10-19 ENCOUNTER — Encounter: Payer: Self-pay | Admitting: Primary Care

## 2018-10-19 ENCOUNTER — Ambulatory Visit: Payer: Medicare HMO | Admitting: Internal Medicine

## 2018-10-19 VITALS — BP 122/78 | HR 101 | Temp 98.8°F | Ht 66.0 in | Wt 167.2 lb

## 2018-10-19 DIAGNOSIS — J449 Chronic obstructive pulmonary disease, unspecified: Secondary | ICD-10-CM | POA: Diagnosis not present

## 2018-10-19 MED ORDER — PREDNISONE 10 MG PO TABS: ORAL_TABLET | ORAL | 0 refills | Status: DC

## 2018-10-19 MED ORDER — DOXYCYCLINE HYCLATE 100 MG PO TABS: 100.0000 mg | ORAL_TABLET | Freq: Two times a day (BID) | ORAL | 0 refills | Status: DC

## 2018-10-19 NOTE — Patient Instructions (Signed)
Seen today for 3 month follow-up, you look well  Continue Stiolto and Arnutiy  Continue Bipap and oxygen  RX: Prednisone taper and doxycycline to have on hand for COPD exacerbation  Follow up: 3 months with Dr. Chase Caller  __________________________________________________________________________________   It is important you stay healthy the next 2-4 weeks, recommend you socially distance yourself OR self isolate so you do not come in contact with anyone that has Corona virus.   If you develop FEVER, cough, shortness of breath please call PCP/[facility] or [phone] to be triaged for potential corona testing. If you have some symptoms but not all symptoms, continue to monitor at home and seek medical attention if your symptoms worsen.

## 2018-10-19 NOTE — Assessment & Plan Note (Addendum)
-   Stable; O2 saturation 98% 3L pulsed today  - Continues oxygen and bipap

## 2018-10-19 NOTE — Assessment & Plan Note (Signed)
-   Stable interval, breathing has actually improved since being on nocturnal Bipap - Continue Stiolto and Arnuity - Rx doxycycline and prednisone taper to have on hand in case of COPD exacerbation - FU in 3 months or if symptoms worsen

## 2018-10-20 ENCOUNTER — Encounter (HOSPITAL_COMMUNITY): Payer: Self-pay

## 2018-10-25 ENCOUNTER — Encounter (HOSPITAL_COMMUNITY): Payer: Self-pay

## 2018-10-25 ENCOUNTER — Telehealth (HOSPITAL_COMMUNITY): Payer: Self-pay

## 2018-10-26 ENCOUNTER — Telehealth: Payer: Self-pay

## 2018-10-26 NOTE — Telephone Encounter (Signed)
Pt called back and is aware

## 2018-10-26 NOTE — Telephone Encounter (Signed)
Per Dr. Havery Moros, in light of the covid-19 outbreak, patient's colonoscopy at [facility] on 4-7 should be postponed until May. LM for pt that we are cancelling the procedure but will place her on a list for a future date once the Endoscopy unit at [facility] opens back up for non urgent cases.  Asked pt to call back to verify receipt of my message. Pt added to hospital wait list.

## 2018-10-27 ENCOUNTER — Other Ambulatory Visit: Payer: Self-pay

## 2018-10-27 ENCOUNTER — Encounter (HOSPITAL_COMMUNITY): Payer: Self-pay

## 2018-10-30 DIAGNOSIS — J449 Chronic obstructive pulmonary disease, unspecified: Secondary | ICD-10-CM | POA: Diagnosis not present

## 2018-10-30 DIAGNOSIS — J9611 Chronic respiratory failure with hypoxia: Secondary | ICD-10-CM | POA: Diagnosis not present

## 2018-11-01 ENCOUNTER — Encounter (HOSPITAL_COMMUNITY): Payer: Self-pay

## 2018-11-03 ENCOUNTER — Encounter (HOSPITAL_COMMUNITY): Payer: Self-pay

## 2018-11-05 DIAGNOSIS — J449 Chronic obstructive pulmonary disease, unspecified: Secondary | ICD-10-CM | POA: Diagnosis not present

## 2018-11-08 ENCOUNTER — Encounter (HOSPITAL_COMMUNITY): Payer: Self-pay

## 2018-11-08 ENCOUNTER — Ambulatory Visit (HOSPITAL_COMMUNITY): Admission: RE | Admit: 2018-11-08 | Payer: Medicare HMO | Source: Home / Self Care | Admitting: Gastroenterology

## 2018-11-08 ENCOUNTER — Encounter (HOSPITAL_COMMUNITY): Admission: RE | Payer: Self-pay | Source: Home / Self Care

## 2018-11-08 SURGERY — COLONOSCOPY WITH PROPOFOL
Anesthesia: Monitor Anesthesia Care

## 2018-11-10 ENCOUNTER — Encounter (HOSPITAL_COMMUNITY): Payer: Self-pay

## 2018-11-15 ENCOUNTER — Encounter (HOSPITAL_COMMUNITY): Payer: Self-pay

## 2018-11-15 ENCOUNTER — Telehealth: Payer: Self-pay | Admitting: Internal Medicine

## 2018-11-15 ENCOUNTER — Telehealth (HOSPITAL_COMMUNITY): Payer: Self-pay | Admitting: *Deleted

## 2018-11-15 ENCOUNTER — Other Ambulatory Visit: Payer: Self-pay

## 2018-11-15 MED ORDER — TIOTROPIUM BROMIDE-OLODATEROL 2.5-2.5 MCG/ACT IN AERS: 2.0000 | INHALATION_SPRAY | Freq: Every day | RESPIRATORY_TRACT | 0 refills | Status: DC

## 2018-11-15 MED ORDER — ALBUTEROL SULFATE HFA 108 (90 BASE) MCG/ACT IN AERS: 1.0000 | INHALATION_SPRAY | Freq: Four times a day (QID) | RESPIRATORY_TRACT | 0 refills | Status: DC | PRN

## 2018-11-15 NOTE — Telephone Encounter (Signed)
Returned call to patient regarding medication question.  Patient states she gets patient med assistance for Stiolto and Proventil inhalers at $40 each her cost.  Now has had some changes to her 'assistance' and was told by insurance that her co-pays would be much less if she had prescriptions filled at local pharmacy.  Patient requests we call in one inhaler of each to price check at [facility] on [address].  Orders placed today.  Instructed patient to call us back if she decides this is more affordable and needs further refills.  Nothing further needed.

## 2018-11-15 NOTE — Progress Notes (Signed)
Patient requested Stiolto one inhaler to be sent to [facility] on [address] (see phone note).  Rx printed instead so reorder sent in and printed Rx destroyed.

## 2018-11-16 MED ORDER — FLUTICASONE PROPIONATE 50 MCG/ACT NA SUSP: 2.0000 | Freq: Every day | NASAL | 5 refills | Status: DC

## 2018-11-17 ENCOUNTER — Encounter (HOSPITAL_COMMUNITY): Payer: Self-pay

## 2018-11-22 ENCOUNTER — Encounter (HOSPITAL_COMMUNITY): Payer: Self-pay

## 2018-11-24 ENCOUNTER — Encounter (HOSPITAL_COMMUNITY): Payer: Self-pay

## 2018-11-29 ENCOUNTER — Encounter (HOSPITAL_COMMUNITY): Payer: Self-pay

## 2018-11-30 DIAGNOSIS — J449 Chronic obstructive pulmonary disease, unspecified: Secondary | ICD-10-CM | POA: Diagnosis not present

## 2018-11-30 DIAGNOSIS — J9611 Chronic respiratory failure with hypoxia: Secondary | ICD-10-CM | POA: Diagnosis not present

## 2018-12-01 ENCOUNTER — Encounter (HOSPITAL_COMMUNITY): Payer: Self-pay

## 2018-12-06 ENCOUNTER — Encounter (HOSPITAL_COMMUNITY): Payer: Self-pay

## 2018-12-08 ENCOUNTER — Encounter (HOSPITAL_COMMUNITY): Payer: Self-pay

## 2018-12-13 ENCOUNTER — Encounter (HOSPITAL_COMMUNITY): Payer: Self-pay

## 2018-12-13 ENCOUNTER — Telehealth (HOSPITAL_COMMUNITY): Payer: Self-pay | Admitting: *Deleted

## 2018-12-13 ENCOUNTER — Telehealth: Payer: Self-pay

## 2018-12-13 NOTE — Telephone Encounter (Signed)
Called patient and let her know she is on our list to reschedule. The hospital is slowly opening back up for cases. We will contact her as soon as we can schedule her Colonoscopy

## 2018-12-14 NOTE — Telephone Encounter (Signed)
Thanks Customer service manager. Will take a look with you at the schedule to see when we can accommodate her.

## 2018-12-15 ENCOUNTER — Telehealth: Payer: Self-pay

## 2018-12-15 ENCOUNTER — Encounter (HOSPITAL_COMMUNITY): Payer: Self-pay

## 2018-12-15 NOTE — Telephone Encounter (Signed)
Called patient and left message to call back, also sent message via My Chart to please call us ref. Trying to get her in for Colon @[facility]  12/19/18, because of another patient's cancellation

## 2018-12-20 ENCOUNTER — Encounter (HOSPITAL_COMMUNITY): Payer: Self-pay

## 2018-12-22 ENCOUNTER — Encounter (HOSPITAL_COMMUNITY): Payer: Self-pay

## 2018-12-23 ENCOUNTER — Other Ambulatory Visit: Payer: Self-pay | Admitting: Internal Medicine

## 2018-12-23 NOTE — Telephone Encounter (Signed)
Pt returned your call to schedule today.

## 2018-12-27 ENCOUNTER — Other Ambulatory Visit: Payer: Self-pay

## 2018-12-27 ENCOUNTER — Telehealth: Payer: Self-pay | Admitting: Gastroenterology

## 2018-12-27 ENCOUNTER — Encounter (HOSPITAL_COMMUNITY): Payer: Self-pay

## 2018-12-27 MED ORDER — MIRTAZAPINE 30 MG PO TABS: 30.0000 mg | ORAL_TABLET | Freq: Every day | ORAL | 1 refills | Status: DC

## 2018-12-27 NOTE — Telephone Encounter (Signed)
Patient is calling back is returning your call from 5-14 to scheduled her Procedure at the hospital

## 2018-12-27 NOTE — Progress Notes (Signed)
Per Dr. Havery Moros, [place] to refill.  90 day supply sent to [facility] order.

## 2018-12-27 NOTE — Telephone Encounter (Signed)
Called patient back and let her know she is on our reschedule list for the hospital

## 2018-12-29 ENCOUNTER — Encounter (HOSPITAL_COMMUNITY): Payer: Self-pay

## 2018-12-30 ENCOUNTER — Ambulatory Visit: Payer: Medicare HMO | Admitting: Internal Medicine

## 2018-12-30 DIAGNOSIS — J9611 Chronic respiratory failure with hypoxia: Secondary | ICD-10-CM | POA: Diagnosis not present

## 2018-12-30 DIAGNOSIS — J449 Chronic obstructive pulmonary disease, unspecified: Secondary | ICD-10-CM | POA: Diagnosis not present

## 2019-01-02 ENCOUNTER — Encounter: Payer: Self-pay | Admitting: Primary Care

## 2019-01-02 ENCOUNTER — Other Ambulatory Visit: Payer: Self-pay

## 2019-01-02 ENCOUNTER — Ambulatory Visit (INDEPENDENT_AMBULATORY_CARE_PROVIDER_SITE_OTHER): Payer: Medicare HMO | Admitting: Primary Care

## 2019-01-02 VITALS — BP 112/72 | HR 88 | Temp 98.0°F | Ht 66.0 in | Wt 170.0 lb

## 2019-01-02 DIAGNOSIS — J449 Chronic obstructive pulmonary disease, unspecified: Secondary | ICD-10-CM | POA: Diagnosis not present

## 2019-01-02 MED ORDER — DOXYCYCLINE HYCLATE 100 MG PO TABS: 100.0000 mg | ORAL_TABLET | Freq: Two times a day (BID) | ORAL | 0 refills | Status: DC

## 2019-01-02 MED ORDER — PREDNISONE 10 MG PO TABS: ORAL_TABLET | ORAL | 0 refills | Status: DC

## 2019-01-02 NOTE — Assessment & Plan Note (Signed)
-   Stable course  - Continue Stiolto 2 puffs into lungs daily - Continue Arnuity 1 puff into lung daily - Albuterol rescue inhaler every 6 hours as needed for breakthrough shortness of breath  - Ok to have doxycycline and prednisone taper on hand - only take if increased purulent mucus, wheezing or more short of breath

## 2019-01-02 NOTE — Progress Notes (Signed)
@Patient  ID: Katie Greene, female    DOB: January 10, 1956, 63 y.o.   MRN: [id number]  Chief Complaint  Patient presents with  . Follow-up    less SOB, vent doing well    Referring provider: Leanna Battles, MD  HPI: 63 year female, former smoker quit in 2013 (12 pack hx). PMH significant for COPD GOLD lV, chronic respiratory failure on nocturnal BIPAP. Patient of Dr. Chase Caller. Maintained on Stiolto, Arnuity and continuous oxygen.   01/02/2019 Patient presents today for 3 month follow-up. She is doing well, breathing well. Continues Stiolto and Arnuity and feels they have been beneficial to her. No changes in oxygen requirements. Continues using trilogy at night. Requires her rescue inhaler once a day on average. Typically has COPD flare with increase cough/mucus symptoms once a month. Asking to have antibiotic and prednisone taper to have on hand, states that she feels more comfortable having something because she lives alone. Discussed appropriate time to take antibiotic/prednisone. She is due for colonoscopy. Weight is stable, up from December.   Allergies  Allergen Reactions  . Daliresp [Roflumilast] Diarrhea, Nausea And Vomiting and Other (See Comments)    Upset stomach    Immunization History  Administered Date(s) Administered  . Influenza Split 06/10/2012, 04/03/2014, 04/20/2015  . Influenza, High Dose Seasonal PF 04/09/2018  . Influenza,inj,Quad PF,6+ Mos 04/24/2013, 03/03/2016  . Influenza-Unspecified 05/29/2017  . Pneumococcal Conjugate-13 05/07/2015  . Pneumococcal Polysaccharide-23 06/10/2012  . Zoster 05/10/2014    Past Medical History:  Diagnosis Date  . Anxiety   . Arthritis   . Chest pain    Improved  . 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 Depression   . Fatigue   . GERD (gastroesophageal reflux disease)   . Hemorrhoids   . Hyperlipidemia   . Hypertension   . On home oxygen therapy    oxygen therapy 2-3 L/m nasally  24/7  . Palpitations     Tobacco History: Social History   Tobacco Use  Smoking Status Former Smoker  . Packs/day: 0.50  . Years: 25.00  . Pack years: 12.50  . Types: Cigarettes  . Last attempt to quit: 03/02/2012  . Years since quitting: 6.8  Smokeless Tobacco Never Used  Tobacco Comment   pt states she smoked some and stopped again 8-13.   Counseling given: Not Answered Comment: pt states she smoked some and stopped again 8-13.   Outpatient Medications Prior to Visit  Medication Sig Dispense Refill  . Acetylcysteine (N-ACETYL-L-CYSTEINE) 600 MG CAPS Take 600 mg by mouth daily.    Marland Kitchen albuterol (PROVENTIL) (2.5 MG/3ML) 0.083% nebulizer solution Take 3 mLs (2.5 mg total) by nebulization every 4 (four) hours as needed for wheezing or shortness of breath. Dx code j44.9 125 mL 6  . albuterol (VENTOLIN HFA) 108 (90 Base) MCG/ACT inhaler INHALE 1 TO 2 PUFFS INTO THE LUNGS EVERY 6 HOURS AS NEEDED FOR WHEEZING FOR SHORTNESS OF BREATH 7 each 0  . ALPRAZolam (XANAX) 0.5 MG tablet Take 0.5 mg by mouth at bedtime as needed for anxiety or sleep.     . AMBULATORY NON FORMULARY MEDICATION VENTILATOR machine at night    . aspirin EC 81 MG tablet Take 81 mg by mouth daily.     Marland Kitchen buPROPion (WELLBUTRIN SR) 150 MG 12 hr tablet Take 150 mg by mouth daily.    . cetirizine (ZYRTEC) 10 MG tablet Take 10 mg by mouth daily.     . cholecalciferol (VITAMIN D)  1000 UNITS tablet Take 1,000 Units by mouth daily.    Marland Kitchen diltiazem (CARDIZEM CD) 180 MG 24 hr capsule Take 180 mg by mouth daily.     . ferrous sulfate 325 (65 FE) MG tablet Take 325 mg by mouth daily with breakfast.    . fluticasone (FLONASE) 50 MCG/ACT nasal spray Place 2 sprays into both nostrils daily. 16 g 5  . Fluticasone Furoate (ARNUITY ELLIPTA) 100 MCG/ACT AEPB Inhale 1 puff into the lungs daily. 30 each 5  . hydrochlorothiazide (HYDRODIURIL) 25 MG tablet Take 25 mg by mouth daily.     . hydrocortisone 1 % ointment Pea size amount into rectum  twice daily (Patient taking differently: Apply 1 application topically 2 (two) times daily as needed (hemorrhoids). Pea size amount into rectum twice daily as needed for hemorrhoids) 30 g 0  . metoprolol succinate (TOPROL-XL) 50 MG 24 hr tablet Take 50 mg by mouth every morning. Take with or immediately following a meal.    . mirtazapine (REMERON) 30 MG tablet Take 1 tablet (30 mg total) by mouth at bedtime. 90 tablet 1  . omeprazole (PRILOSEC) 20 MG capsule Take 20 mg by mouth daily.     . OXYGEN Inhale 2-3 L into the lungs daily.    . pravastatin (PRAVACHOL) 80 MG tablet Take 80 mg by mouth every morning.     Marland Kitchen spironolactone (ALDACTONE) 50 MG tablet Take 25 mg by mouth daily.     Marland Kitchen STIOLTO RESPIMAT 2.5-2.5 MCG/ACT AERS INHALE 2 PUFFS INTO THE LUNGS ONCE DAILY 4 g 0  . temazepam (RESTORIL) 30 MG capsule Take 30 mg by mouth at bedtime as needed for sleep.   4  . doxycycline (VIBRA-TABS) 100 MG tablet Take 1 tablet (100 mg total) by mouth 2 (two) times daily. 14 tablet 0  . predniSONE (DELTASONE) 10 MG tablet Take 4 tabs po daily x 2 days; then 3 tabs for 2 days; then 2 tabs for 2 days; then 1 tab for 2 days 20 tablet 0   No facility-administered medications prior to visit.    Review of Systems  Review of Systems  Constitutional: Negative.   HENT: Negative.   Respiratory: Negative for cough, shortness of breath and wheezing.   Cardiovascular: Negative.    Physical Exam  BP 112/72 (BP Location: Left Arm, Cuff Size: Normal)   Pulse 88   Temp 98 F (36.7 C)   Ht 5\' 6"  (1.676 m)   Wt 170 lb (77.1 kg)   SpO2 99%   BMI 27.44 kg/m  Physical Exam Constitutional:      General: She is not in acute distress.    Appearance: She is not ill-appearing.  HENT:     Right Ear: Tympanic membrane normal.     Left Ear: Tympanic membrane normal.     Mouth/Throat:     Mouth: Mucous membranes are moist.     Pharynx: Oropharynx is clear.  Neck:     Musculoskeletal: Normal range of motion and neck  supple.  Cardiovascular:     Rate and Rhythm: Normal rate and regular rhythm.  Pulmonary:     Effort: Pulmonary effort is normal. No respiratory distress.     Breath sounds: No wheezing or rhonchi.  Musculoskeletal: Normal range of motion.  Skin:    General: Skin is warm and dry.  Neurological:     General: No focal deficit present.     Mental Status: She is alert and oriented to person, place, and time.  Mental status is at baseline.  Psychiatric:        Mood and Affect: Mood normal.        Behavior: Behavior normal.        Thought Content: Thought content normal.        Judgment: Judgment normal.      Lab Results:  CBC    Component Value Date/Time   WBC 9.9 10/02/2017 1912   RBC 4.06 10/02/2017 1912   HGB 11.6 (L) 10/02/2017 1912   HCT 36.5 10/02/2017 1912   PLT 307 10/02/2017 1912   MCV 89.9 10/02/2017 1912   MCH 28.6 10/02/2017 1912   MCHC 31.8 10/02/2017 1912   RDW 12.3 10/02/2017 1912   LYMPHSABS 2.3 03/09/2017 1034   MONOABS 1.4 (H) 03/09/2017 1034   EOSABS 0.1 03/09/2017 1034   BASOSABS 0.1 03/09/2017 1034    BMET    Component Value Date/Time   NA 138 01/26/2018 0934   K 4.3 01/26/2018 0934   CL 96 01/26/2018 0934   CO2 33 (H) 01/26/2018 0934   GLUCOSE 166 (H) 01/26/2018 0934   BUN 16 01/26/2018 0934   CREATININE 1.17 01/26/2018 0934   CALCIUM 10.2 01/26/2018 0934   GFRNONAA 43 (L) 10/02/2017 1912   GFRAA 50 (L) 10/02/2017 1912    BNP No results found for: BNP  ProBNP No results found for: PROBNP  Imaging: No results found.   Assessment & Plan:   COPD (chronic obstructive pulmonary disease) - Stable course  - Continue Stiolto 2 puffs into lungs daily - Continue Arnuity 1 puff into lung daily - Albuterol rescue inhaler every 6 hours as needed for breakthrough shortness of breath  - Ok to have doxycycline and prednisone taper on hand - only take if increased purulent mucus, wheezing or more short of breath    Chronic respiratory failure  - O2 sat 99% 3L, continues home oxygen - Continue Trilogy ventilator    Follow up with Dr. Chase Caller in 4 months or sooner if needed  Martyn Ehrich, NP 01/02/2019

## 2019-01-02 NOTE — Assessment & Plan Note (Addendum)
-   O2 sat 99% 3L, continues home oxygen - Continue Trilogy ventilator

## 2019-01-02 NOTE — Patient Instructions (Signed)
I'm glad you are doing well, it was great seeing you today!  Continue Stiolto 2 puffs into lungs daily  Continue Arnuity 1 puff into lung daily  Use Albuterol rescue inhaler every 6 hours as needed for breakthrough shortness of breath   Ok to have doxycycline and prednisone taper on hand - only take if increased purulent mucus, wheezing or more short of breath   Follow up with Dr. Chase Caller in 4 months or sooner if needed

## 2019-01-03 ENCOUNTER — Encounter (HOSPITAL_COMMUNITY): Payer: Self-pay

## 2019-01-04 ENCOUNTER — Other Ambulatory Visit: Payer: Self-pay

## 2019-01-04 ENCOUNTER — Telehealth: Payer: Self-pay

## 2019-01-04 DIAGNOSIS — Z86010 Personal history of colonic polyps: Secondary | ICD-10-CM

## 2019-01-04 NOTE — Telephone Encounter (Signed)
Patient scheduled for Colonoscopy at [facility] 01/17/19 at 9:15am, patient to arrive at 7:45am.  COVID testing in Epic and patient notified to go to [place]. [facility] 01/13/19 between 9am-3pm also told patient  About requirement to quarantine. Previsit scheduled 01/09/19 at 9am by phone. Information also sent by My Chart, per patient request

## 2019-01-04 NOTE — Telephone Encounter (Signed)
Left message to call back  

## 2019-01-05 ENCOUNTER — Encounter (HOSPITAL_COMMUNITY): Payer: Self-pay

## 2019-01-09 ENCOUNTER — Ambulatory Visit (AMBULATORY_SURGERY_CENTER): Payer: Self-pay | Admitting: *Deleted

## 2019-01-09 ENCOUNTER — Other Ambulatory Visit: Payer: Self-pay

## 2019-01-09 VITALS — Ht 66.0 in | Wt 168.0 lb

## 2019-01-09 DIAGNOSIS — Z86010 Personal history of colonic polyps: Secondary | ICD-10-CM

## 2019-01-09 DIAGNOSIS — Z800 Family history of malignant neoplasm of digestive organs: Secondary | ICD-10-CM

## 2019-01-09 NOTE — Progress Notes (Signed)
  Patient came in for her Previsit No egg or soy allergy known to patient  No issues with past sedation with any surgeries  or procedures, no intubation problems  No diet pills per patient  home 02 use per patient, 3-4 l/min with ventilator like machine at HS  No blood thinners per patient  Pt denies issues with constipation  No A fib or A flutter  EMMI information given to the patient. Patient has Suprep at home

## 2019-01-10 ENCOUNTER — Encounter (HOSPITAL_COMMUNITY): Payer: Self-pay

## 2019-01-12 ENCOUNTER — Encounter (HOSPITAL_COMMUNITY): Payer: Self-pay

## 2019-01-13 ENCOUNTER — Other Ambulatory Visit: Payer: Self-pay

## 2019-01-13 ENCOUNTER — Other Ambulatory Visit (HOSPITAL_COMMUNITY)
Admission: RE | Admit: 2019-01-13 | Discharge: 2019-01-13 | Disposition: A | Discharge status: Home / Self Care | Payer: Medicare HMO | Source: Ambulatory Visit | Attending: Gastroenterology | Admitting: Gastroenterology

## 2019-01-13 DIAGNOSIS — Z1159 Encounter for screening for other viral diseases: Secondary | ICD-10-CM | POA: Diagnosis not present

## 2019-01-13 DIAGNOSIS — Z01812 Encounter for preprocedural laboratory examination: Secondary | ICD-10-CM | POA: Insufficient documentation

## 2019-01-14 LAB — NOVEL CORONAVIRUS, NAA (HOSP ORDER, SEND-OUT TO REF LAB; TAT 18-24 HRS): SARS-CoV-2, NAA: NOT DETECTED

## 2019-01-16 ENCOUNTER — Encounter (HOSPITAL_COMMUNITY): Payer: Self-pay

## 2019-01-16 ENCOUNTER — Other Ambulatory Visit: Payer: Self-pay

## 2019-01-16 NOTE — Progress Notes (Signed)
SPOKE W/  Olin Hauser     SCREENING SYMPTOMS OF COVID 19:   COUGH--NO  RUNNY NOSE--- NO  SORE THROAT---NO  NASAL CONGESTION----NO  SNEEZING----NO  SHORTNESS OF BREATH---NO  DIFFICULTY BREATHING---NO  TEMP >100.0 -----NO  UNEXPLAINED BODY ACHES------NO  CHILLS -------- NO  HEADACHES ---------NO  LOSS OF SMELL/ TASTE --------NO    HAVE YOU OR ANY FAMILY MEMBER TRAVELLED PAST 14 DAYS OUT OF THE   COUNTY---NO STATE----NO COUNTRY----NO  HAVE YOU OR ANY FAMILY MEMBER BEEN EXPOSED TO ANYONE WITH COVID 19? NO

## 2019-01-16 NOTE — Anesthesia Preprocedure Evaluation (Addendum)
Anesthesia Evaluation  Patient identified by MRN, date of birth, ID band Patient awake    Reviewed: Allergy & Precautions, H&P , NPO status , Patient's Chart, lab work & pertinent test results, reviewed documented beta blocker date and time   Airway Mallampati: II  TM Distance: >3 FB Neck ROM: Full    Dental no notable dental hx. (+) Edentulous Upper, Edentulous Lower, Dental Advisory Given   Pulmonary COPD,  COPD inhaler and oxygen dependent, former smoker,    Pulmonary exam normal breath sounds clear to auscultation       Cardiovascular Exercise Tolerance: Good hypertension, Pt. on medications and Pt. on home beta blockers  Rhythm:Regular Rate:Normal     Neuro/Psych Anxiety Depression negative neurological ROS     GI/Hepatic Neg liver ROS, GERD  Medicated and Controlled,  Endo/Other  negative endocrine ROS  Renal/GU negative Renal ROS  negative genitourinary   Musculoskeletal  (+) Arthritis , Osteoarthritis,    Abdominal   Peds  Hematology  (+) Blood dyscrasia, anemia ,   Anesthesia Other Findings   Reproductive/Obstetrics negative OB ROS                            Anesthesia Physical Anesthesia Plan  ASA: III  Anesthesia Plan: MAC   Post-op Pain Management:    Induction: Intravenous  PONV Risk Score and Plan: 2 and Ondansetron and Propofol infusion  Airway Management Planned: Simple Face Mask  Additional Equipment:   Intra-op Plan:   Post-operative Plan:   Informed Consent: I have reviewed the patients History and Physical, chart, labs and discussed the procedure including the risks, benefits and alternatives for the proposed anesthesia with the patient or authorized representative who has indicated his/her understanding and acceptance.     Dental advisory given  Plan Discussed with: CRNA  Anesthesia Plan Comments:         Anesthesia Quick Evaluation

## 2019-01-17 ENCOUNTER — Ambulatory Visit (HOSPITAL_COMMUNITY): Payer: Medicare HMO | Admitting: Anesthesiology

## 2019-01-17 ENCOUNTER — Encounter (HOSPITAL_COMMUNITY)
Admission: RE | Disposition: A | Discharge status: Home / Self Care | Payer: Self-pay | Source: Home / Self Care | Attending: Gastroenterology

## 2019-01-17 ENCOUNTER — Encounter (HOSPITAL_COMMUNITY): Payer: Self-pay

## 2019-01-17 ENCOUNTER — Encounter (HOSPITAL_COMMUNITY): Payer: Self-pay | Admitting: *Deleted

## 2019-01-17 ENCOUNTER — Other Ambulatory Visit: Payer: Self-pay

## 2019-01-17 ENCOUNTER — Ambulatory Visit (HOSPITAL_COMMUNITY)
Admission: RE | Admit: 2019-01-17 | Discharge: 2019-01-17 | Disposition: A | Discharge status: Home / Self Care | Payer: Medicare HMO | Attending: Gastroenterology | Admitting: Gastroenterology

## 2019-01-17 DIAGNOSIS — F329 Major depressive disorder, single episode, unspecified: Secondary | ICD-10-CM | POA: Insufficient documentation

## 2019-01-17 DIAGNOSIS — J449 Chronic obstructive pulmonary disease, unspecified: Secondary | ICD-10-CM | POA: Diagnosis not present

## 2019-01-17 DIAGNOSIS — F419 Anxiety disorder, unspecified: Secondary | ICD-10-CM | POA: Diagnosis not present

## 2019-01-17 DIAGNOSIS — D120 Benign neoplasm of cecum: Secondary | ICD-10-CM | POA: Insufficient documentation

## 2019-01-17 DIAGNOSIS — D509 Iron deficiency anemia, unspecified: Secondary | ICD-10-CM | POA: Insufficient documentation

## 2019-01-17 DIAGNOSIS — D122 Benign neoplasm of ascending colon: Secondary | ICD-10-CM | POA: Insufficient documentation

## 2019-01-17 DIAGNOSIS — I10 Essential (primary) hypertension: Secondary | ICD-10-CM | POA: Insufficient documentation

## 2019-01-17 DIAGNOSIS — Z860100 Personal history of colon polyps, unspecified: Secondary | ICD-10-CM

## 2019-01-17 DIAGNOSIS — Q438 Other specified congenital malformations of intestine: Secondary | ICD-10-CM | POA: Diagnosis not present

## 2019-01-17 DIAGNOSIS — Z7982 Long term (current) use of aspirin: Secondary | ICD-10-CM | POA: Diagnosis not present

## 2019-01-17 DIAGNOSIS — Z87891 Personal history of nicotine dependence: Secondary | ICD-10-CM | POA: Diagnosis not present

## 2019-01-17 DIAGNOSIS — K219 Gastro-esophageal reflux disease without esophagitis: Secondary | ICD-10-CM | POA: Insufficient documentation

## 2019-01-17 DIAGNOSIS — Z9981 Dependence on supplemental oxygen: Secondary | ICD-10-CM | POA: Diagnosis not present

## 2019-01-17 DIAGNOSIS — E785 Hyperlipidemia, unspecified: Secondary | ICD-10-CM | POA: Diagnosis not present

## 2019-01-17 DIAGNOSIS — Z79899 Other long term (current) drug therapy: Secondary | ICD-10-CM | POA: Diagnosis not present

## 2019-01-17 DIAGNOSIS — D126 Benign neoplasm of colon, unspecified: Secondary | ICD-10-CM

## 2019-01-17 DIAGNOSIS — Z09 Encounter for follow-up examination after completed treatment for conditions other than malignant neoplasm: Secondary | ICD-10-CM | POA: Diagnosis present

## 2019-01-17 DIAGNOSIS — D123 Benign neoplasm of transverse colon: Secondary | ICD-10-CM | POA: Diagnosis not present

## 2019-01-17 DIAGNOSIS — D175 Benign lipomatous neoplasm of intra-abdominal organs: Secondary | ICD-10-CM | POA: Diagnosis not present

## 2019-01-17 DIAGNOSIS — J302 Other seasonal allergic rhinitis: Secondary | ICD-10-CM | POA: Insufficient documentation

## 2019-01-17 DIAGNOSIS — K648 Other hemorrhoids: Secondary | ICD-10-CM | POA: Diagnosis not present

## 2019-01-17 HISTORY — DX: Personal history of colonic polyps: Z86.010

## 2019-01-17 HISTORY — DX: Presence of spectacles and contact lenses: Z97.3

## 2019-01-17 HISTORY — DX: Presence of dental prosthetic device (complete) (partial): Z97.2

## 2019-01-17 HISTORY — DX: Other seasonal allergic rhinitis: J30.2

## 2019-01-17 HISTORY — DX: Iron deficiency anemia, unspecified: D50.9

## 2019-01-17 HISTORY — PX: POLYPECTOMY: SHX5525

## 2019-01-17 HISTORY — DX: Chronic respiratory failure, unspecified whether with hypoxia or hypercapnia: J96.10

## 2019-01-17 HISTORY — DX: Other nonspecific abnormal finding of lung field: R91.8

## 2019-01-17 HISTORY — PX: COLONOSCOPY WITH PROPOFOL: SHX5780

## 2019-01-17 HISTORY — DX: Personal history of colon polyps, unspecified: Z86.0100

## 2019-01-17 SURGERY — COLONOSCOPY WITH PROPOFOL
Anesthesia: Monitor Anesthesia Care

## 2019-01-17 MED ORDER — SODIUM CHLORIDE 0.9 % IV SOLN: INTRAVENOUS | Status: DC

## 2019-01-17 MED ORDER — ONDANSETRON HCL 4 MG/2ML IJ SOLN
INTRAMUSCULAR | Status: DC | PRN
  Administered 2019-01-17: 4 mg via INTRAVENOUS

## 2019-01-17 MED ORDER — PROPOFOL 10 MG/ML IV BOLUS
INTRAVENOUS | Status: AC
  Filled 2019-01-17: qty 40

## 2019-01-17 MED ORDER — PROPOFOL 500 MG/50ML IV EMUL
INTRAVENOUS | Status: DC | PRN
  Administered 2019-01-17: 125 ug/kg/min via INTRAVENOUS

## 2019-01-17 MED ORDER — PROPOFOL 500 MG/50ML IV EMUL
INTRAVENOUS | Status: DC | PRN
  Administered 2019-01-17 (×4): 10 mg via INTRAVENOUS
  Administered 2019-01-17: 30 mg via INTRAVENOUS

## 2019-01-17 MED ORDER — PROPOFOL 10 MG/ML IV BOLUS
INTRAVENOUS | Status: AC
  Filled 2019-01-17: qty 20

## 2019-01-17 MED ORDER — LACTATED RINGERS IV SOLN
INTRAVENOUS | Status: DC | PRN
  Administered 2019-01-17: 09:00:00 via INTRAVENOUS

## 2019-01-17 MED ORDER — LACTATED RINGERS IV SOLN
INTRAVENOUS | Status: DC
  Administered 2019-01-17: 09:00:00 via INTRAVENOUS

## 2019-01-17 SURGICAL SUPPLY — 22 items

## 2019-01-17 NOTE — Anesthesia Postprocedure Evaluation (Signed)
Anesthesia Post Note  Patient: Katie Greene  Procedure(s) Performed: COLONOSCOPY WITH PROPOFOL (N/A ) POLYPECTOMY     Patient location during evaluation: PACU Anesthesia Type: MAC Level of consciousness: awake and alert Pain management: pain level controlled Vital Signs Assessment: post-procedure vital signs reviewed and stable Respiratory status: spontaneous breathing, nonlabored ventilation, respiratory function stable and patient connected to nasal cannula oxygen Cardiovascular status: stable and blood pressure returned to baseline Postop Assessment: no apparent nausea or vomiting Anesthetic complications: no    Last Vitals:  Vitals:   01/17/19 1040 01/17/19 1050  BP: 121/74 132/82  Pulse: 85 85  Resp: 17 17  Temp:    SpO2: 95% 92%    Last Pain:  Vitals:   01/17/19 1050  TempSrc:   PainSc: 0-No pain                 Jaquay Morneault

## 2019-01-17 NOTE — Interval H&P Note (Signed)
History and Physical Interval Note:  01/17/2019 9:11 AM  Katie Greene  has presented today for surgery, with the diagnosis of adenomatous polyp on previous colonoscopy.  The various methods of treatment have been discussed with the patient and family. After consideration of risks, benefits and other options for treatment, the patient has consented to  Procedure(s): COLONOSCOPY WITH PROPOFOL (N/A) as a surgical intervention.  The patient's history has been reviewed, patient examined, no change in status, stable for surgery.  I have reviewed the patient's chart and labs.  Questions were answered to the patient's satisfaction.     [place]

## 2019-01-17 NOTE — Discharge Instructions (Signed)
Colonoscopy, Adult, Care After °This sheet gives you information about how to care for yourself after your procedure. Your doctor may also give you more specific instructions. If you have problems or questions, call your doctor. °What can I expect after the procedure? °After the procedure, it is common to have: °· A small amount of blood in your poop for 24 hours. °· Some gas. °· Mild cramping or bloating in your belly. °Follow these instructions at home: °General instructions °· For the first 24 hours after the procedure: °? Do not drive or use machinery. °? Do not sign important documents. °? Do not drink alcohol. °? Do your daily activities more slowly than normal. °? Eat foods that are soft and easy to digest. °· Take over-the-counter or prescription medicines only as told by your doctor. °To help cramping and bloating: ° °· Try walking around. °· Put heat on your belly (abdomen) as told by your doctor. Use a heat source that your doctor recommends, such as a moist heat pack or a heating pad. °? Put a towel between your skin and the heat source. °? Leave the heat on for 20-30 minutes. °? Remove the heat if your skin turns bright red. This is especially important if you cannot feel pain, heat, or cold. You can get burned. °Eating and drinking ° °· Drink enough fluid to keep your pee (urine) clear or pale yellow. °· Return to your normal diet as told by your doctor. Avoid heavy or fried foods that are hard to digest. °· Avoid drinking alcohol for as long as told by your doctor. °Contact a doctor if: °· You have blood in your poop (stool) 2-3 days after the procedure. °Get help right away if: °· You have more than a small amount of blood in your poop. °· You see large clumps of tissue (blood clots) in your poop. °· Your belly is swollen. °· You feel sick to your stomach (nauseous). °· You throw up (vomit). °· You have a fever. °· You have belly pain that gets worse, and medicine does not help your  pain. °Summary °· After the procedure, it is common to have a small amount of blood in your poop. You may also have mild cramping and bloating in your belly. °· For the first 24 hours after the procedure, do not drive or use machinery, do not sign important documents, and do not drink alcohol. °· Get help right away if you have a lot of blood in your poop, feel sick to your stomach, have a fever, or have more belly pain. °This information is not intended to replace advice given to you by your health care provider. Make sure you discuss any questions you have with your health care provider. °Document Released: 08/22/2010 Document Revised: 05/20/2017 Document Reviewed: 04/13/2016 °Elsevier Interactive Patient Education © 2019 [employer] ° °

## 2019-01-17 NOTE — H&P (Signed)
HPI :  63 y/o female with a history of COPD on oxygen, strong FH of colon cancer, and personal history of colon polyps, largest 10mm adenoma 3 years ago. Here for surveillance colonoscopy at the hospital given oxygen use.   Past Medical History:  Diagnosis Date  . Allergy   . Anxiety   . Arthritis   . Chest pain    Improved  . [place] respiratory [place])   . COPD (chronic obstructive pulmonary disease) (HCC)    Stage IV, oxygen dependant, triology ventilator  . Depression   . Emphysema of lung ([place])   . Fatigue   . GERD (gastroesophageal reflux disease)   . Hemorrhoids   . History of colon polyps   . Hyperlipidemia   . Hypertension   . Iron deficiency anemia   . On home oxygen therapy    oxygen therapy 2-3 L/m nasally 24/7  . Oxygen deficiency   . Palpitations   . Pulmonary nodules   . Seasonal allergies   . Wears dentures   . Wears glasses      Past Surgical History:  Procedure Laterality Date  . CHOLECYSTECTOMY     laparosopic attempted, then had to open  . COLONOSCOPY     x2 with polyps removed  . COLONOSCOPY N/A 08/27/2015   Procedure: COLONOSCOPY;  Surgeon: Manus Gunning, MD;  Location: [facility];  Service: Gastroenterology;  Laterality: N/A;  . oopherectomy     1 ovary removed only  . POLYPECTOMY    . SALPINGECTOMY     1 fallopian tube removed   Family History  Problem Relation Age of Onset  . Heart attack Mother   . Pancreatic cancer Mother   . Prostate cancer Father   . Hypertension Father   . Throat cancer Father   . Throat cancer Brother   . Prostate cancer Brother   . Colon cancer Sister   . Brain cancer Sister   . Stomach cancer Neg Hx   . Colon polyps Neg Hx   . Rectal cancer Neg Hx   . Esophageal cancer Neg Hx    Social History   Tobacco Use  . Smoking status: Former Smoker    Packs/day: 0.50    Years: 25.00    Pack years: 12.50    Types: Cigarettes    Quit date: 03/02/2012    Years since quitting: 6.8  .  Smokeless tobacco: Never Used  . Tobacco comment: pt states she smoked some and stopped again 8-13.  Substance Use Topics  . Alcohol use: No  . Drug use: No   Current Facility-Administered Medications  Medication Dose Route Frequency Provider Last Rate Last Dose  . 0.9 %  sodium chloride infusion   Intravenous Continuous Kienna Moncada, Carlota Raspberry, MD      . lactated ringers infusion   Intravenous Continuous Jerrid Forgette, Carlota Raspberry, MD       Allergies  Allergen Reactions  . Daliresp [Roflumilast] Diarrhea, Nausea And Vomiting and Other (See Comments)    Upset stomach     Review of Systems: All systems reviewed and negative except where noted in HPI.   Lab Results  Component Value Date   WBC 9.9 10/02/2017   HGB 11.6 (L) 10/02/2017   HCT 36.5 10/02/2017   MCV 89.9 10/02/2017   PLT 307 10/02/2017    Lab Results  Component Value Date   CREATININE 1.17 01/26/2018   BUN 16 01/26/2018   NA 138 01/26/2018   K 4.3 01/26/2018  CL 96 01/26/2018   CO2 33 (H) 01/26/2018    Lab Results  Component Value Date   ALT 15 10/02/2017   AST 23 10/02/2017   ALKPHOS 85 10/02/2017   BILITOT 0.4 10/02/2017     Physical Exam: BP 120/63   Pulse 96   Temp 98.4 F (36.9 C) (Oral)   Resp (!) 23   Ht 5\' 6"  (1.676 m)   Wt 75.3 kg   SpO2 100%   BMI 26.79 kg/m  Constitutional: Pleasant,well-developed, female in no acute distress. Pulmonary/chest: Effort normal and breath sounds normal.  Abdominal: Soft, nondistended, nontender. There are no masses palpable.  Extremities: no edema    ASSESSMENT AND PLAN: 63 y/o female here for surveillance colonoscopy, done at the hospital due to oxygen requirements. Have discusssed risks / benefits, she wishes to proceed. Further recommendations pending the results.  Bradford Cellar, MD [facility]

## 2019-01-17 NOTE — Anesthesia Procedure Notes (Signed)
Date/Time: 01/17/2019 9:23 AM Performed by: Glory Buff, CRNA Oxygen Delivery Method: Nasal cannula

## 2019-01-17 NOTE — Op Note (Signed)
[facility] Patient Name: Katie Greene Procedure Date: 01/17/2019 MRN: [id number] Attending MD: Carlota Raspberry. Havery Moros , MD Date of Birth: Dec 23, 1955 CSN: [id number] Age: 63 Admit Type: Inpatient Procedure:                Colonoscopy Indications:              Surveillance: Personal history of adenomatous                            polyps on last colonoscopy 3 years ago Providers:                Remo Lipps P. Havery Moros, MD, Cleda Daub, RN, Ladona Ridgel, Technician Referring MD:              Medicines:                Monitored Anesthesia Care Complications:            No immediate complications. Estimated blood loss:                            Minimal. Estimated Blood Loss:     Estimated blood loss was minimal. Procedure:                Pre-Anesthesia Assessment:                           - Prior to the procedure, a History and Physical                            was performed, and patient medications and                            allergies were reviewed. The patient's tolerance of                            previous anesthesia was also reviewed. The risks                            and benefits of the procedure and the sedation                            options and risks were discussed with the patient.                            All questions were answered, and informed consent                            was obtained. Prior Anticoagulants: The patient has                            taken no previous anticoagulant or antiplatelet                            agents. ASA  Grade Assessment: III - A patient with                            severe systemic disease. After reviewing the risks                            and benefits, the patient was deemed in                            satisfactory condition to undergo the procedure.                           After obtaining informed consent, the colonoscope                            was passed  under direct vision. Throughout the                            procedure, the patient's blood pressure, pulse, and                            oxygen saturations were monitored continuously. The                            [id number]-[id number] ([id number]) Olympus pediatric colonscope                            was introduced through the anus and advanced to the                            the cecum, identified by appendiceal orifice and                            ileocecal valve. The colonoscopy was technically                            difficult and complex due to significant looping.                            The patient tolerated the procedure well. The                            quality of the bowel preparation was good. The                            ileocecal valve, appendiceal orifice, and rectum                            were photographed. Scope In: 9:30:17 AM Scope Out: 10:21:43 AM Scope Withdrawal Time: 0 hours 32 minutes 28 seconds  Total Procedure Duration: 0 hours 51 minutes 26 seconds  Findings:      The perianal and digital rectal examinations were normal.      A 3 mm polyp was found in the cecum. The polyp  was sessile. The polyp       was removed with a cold snare. Resection and retrieval were complete.      Three sessile polyps were found in the ascending colon. The polyps were       3 to 4 mm in size. These polyps were removed with a cold snare.       Resection and retrieval were complete.      There was a medium-sized lipoma, in the ascending colon.      Two sessile polyps were found in the hepatic flexure. The polyps were 3       to 10 mm in size. These polyps were removed with a cold snare. Resection       and retrieval were complete.      A 8 mm polyp was found in the proximal transverse colon. The polyp was       sessile. The polyp was removed with a cold snare. Resection and       retrieval were complete.      The colon was extremely tortuous. Cecal intubation was quite  prolonged       due to difficult navigating the hepatic flexure, was difficult to       visualize with sharply angulated turns. Abdominal pressure was required.      Internal hemorrhoids were found during retroflexion.      The exam was otherwise without abnormality. Impression:               - One 3 mm polyp in the cecum, removed with a cold                            snare. Resected and retrieved.                           - Three 3 to 4 mm polyps in the ascending colon,                            removed with a cold snare. Resected and retrieved.                           - Medium-sized lipoma in the ascending colon.                           - Two 3 to 10 mm polyps at the hepatic flexure,                            removed with a cold snare. Resected and retrieved.                           - One 8 mm polyp in the proximal transverse colon,                            removed with a cold snare. Resected and retrieved.                           - Extremely tortuous colon - difficult cecal  intubation and difficulty performing polypectomy                            due to looping.                           - Internal hemorrhoids.                           - The examination was otherwise normal. Moderate Sedation:      No moderate sedation, case performed with MAC Recommendation:           - Patient has a contact number available for                            emergencies. The signs and symptoms of potential                            delayed complications were discussed with the                            patient. Return to normal activities tomorrow.                            Written discharge instructions were provided to the                            patient.                           - Resume previous diet.                           - Continue present medications.                           - Await pathology results. Procedure Code(s):        ---  Professional ---                           [phone], Colonoscopy, flexible; with removal of                            tumor(s), polyp(s), or other lesion(s) by snare                            technique Diagnosis Code(s):        --- Professional ---                           K63.5, Polyp of colon                           Z86.010, Personal history of colonic polyps                           D17.5, Benign lipomatous neoplasm of  intra-abdominal organs                           K64.8, Other hemorrhoids                           Q43.8, Other specified congenital malformations of                            intestine CPT copyright 2019 [employer]. All rights reserved. The codes documented in this report are preliminary and upon coder review may  be revised to meet current compliance requirements. Remo Lipps P. Armbruster, MD 01/17/2019 10:31:27 AM This report has been signed electronically. Number of Addenda: 0

## 2019-01-17 NOTE — Transfer of Care (Signed)
Immediate Anesthesia Transfer of Care Note  Patient: Katie Greene  Procedure(s) Performed: COLONOSCOPY WITH PROPOFOL (N/A ) POLYPECTOMY  Patient Location: PACU  Anesthesia Type:MAC  Level of Consciousness: awake, alert  and oriented  Airway & Oxygen Therapy: Patient Spontanous Breathing and Patient connected to nasal cannula oxygen  Post-op Assessment: Report given to RN and Post -op Vital signs reviewed and stable  Post vital signs: Reviewed and stable  Last Vitals:  Vitals Value Taken Time  BP    Temp    Pulse    Resp    SpO2      Last Pain:  Vitals:   01/17/19 0853  TempSrc: Oral  PainSc: 0-No pain         Complications: No apparent anesthesia complications

## 2019-01-23 ENCOUNTER — Telehealth (HOSPITAL_COMMUNITY): Payer: Self-pay | Admitting: *Deleted

## 2019-01-23 NOTE — Telephone Encounter (Signed)
Called to inform patient that due to covid-19 precautions [facility] is unable to return to large exercise classes. At this time we are not certain if or when the maintenance classes will resume, we will contact if this changes.

## 2019-01-28 ENCOUNTER — Other Ambulatory Visit: Payer: Self-pay | Admitting: Internal Medicine

## 2019-01-30 DIAGNOSIS — J449 Chronic obstructive pulmonary disease, unspecified: Secondary | ICD-10-CM | POA: Diagnosis not present

## 2019-01-30 DIAGNOSIS — J9611 Chronic respiratory failure with hypoxia: Secondary | ICD-10-CM | POA: Diagnosis not present

## 2019-02-24 ENCOUNTER — Telehealth: Payer: Self-pay | Admitting: Internal Medicine

## 2019-02-24 MED ORDER — ARNUITY ELLIPTA 100 MCG/ACT IN AEPB: 1.0000 | INHALATION_SPRAY | Freq: Every day | RESPIRATORY_TRACT | 5 refills | Status: DC

## 2019-02-24 NOTE — Telephone Encounter (Signed)
Refill for Arnuity sent to pt's preferred pharmacy. Called pt but unable to reach. Left pt a detailed message letting her know that the Rx was sent to pharmacy for her. Nothing further needed.

## 2019-03-01 DIAGNOSIS — J449 Chronic obstructive pulmonary disease, unspecified: Secondary | ICD-10-CM | POA: Diagnosis not present

## 2019-03-01 DIAGNOSIS — J9611 Chronic respiratory failure with hypoxia: Secondary | ICD-10-CM | POA: Diagnosis not present

## 2019-03-24 DIAGNOSIS — Z1231 Encounter for screening mammogram for malignant neoplasm of breast: Secondary | ICD-10-CM | POA: Diagnosis not present

## 2019-03-27 ENCOUNTER — Telehealth: Payer: Self-pay | Admitting: Internal Medicine

## 2019-03-27 NOTE — Telephone Encounter (Signed)
Spoke with pt. She has been scheduled for her flu shot on 03/29/2019 at 1000. Nothing further was needed.

## 2019-03-29 ENCOUNTER — Other Ambulatory Visit: Payer: Self-pay

## 2019-03-29 ENCOUNTER — Ambulatory Visit (INDEPENDENT_AMBULATORY_CARE_PROVIDER_SITE_OTHER): Payer: Medicare HMO

## 2019-03-29 DIAGNOSIS — Z23 Encounter for immunization: Secondary | ICD-10-CM | POA: Diagnosis not present

## 2019-04-01 DIAGNOSIS — J449 Chronic obstructive pulmonary disease, unspecified: Secondary | ICD-10-CM | POA: Diagnosis not present

## 2019-04-01 DIAGNOSIS — J9611 Chronic respiratory failure with hypoxia: Secondary | ICD-10-CM | POA: Diagnosis not present

## 2019-04-02 IMAGING — CT CT ABD-PELV W/ CM
2 of 5 series · 16 of 46 positions shown, 18 images · IV contrast (ISOVUE 300)
Comparison: 12/30/2017 CT abdomen/pelvis.

CLINICAL DATA: Right flank pain.  Reported history of weight loss.

EXAM:
CT ABDOMEN AND PELVIS WITH CONTRAST
TECHNIQUE: Multidetector CT imaging of the abdomen and pelvis was performed
using the standard protocol following bolus administration of
intravenous contrast.
CONTRAST:  100mL [id number] IOPAMIDOL ([id number]) INJECTION 61%

[Series 2: abd/pel w · axial · 0.92mm/px · z∈[+931,+1286]mm · 13 of 81 slices shown, 15 images]
[im 5/81  soft-tissue]
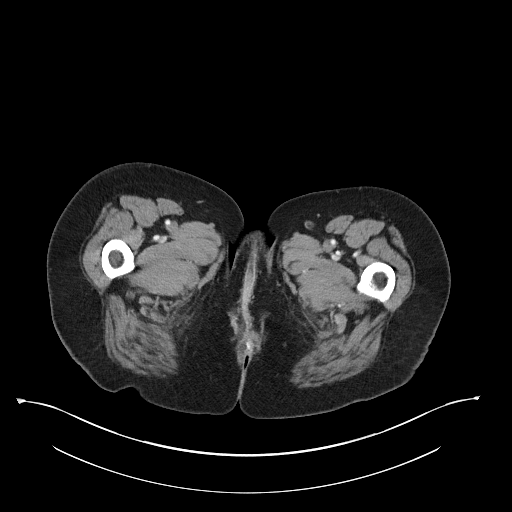
[im 5/81  bone]
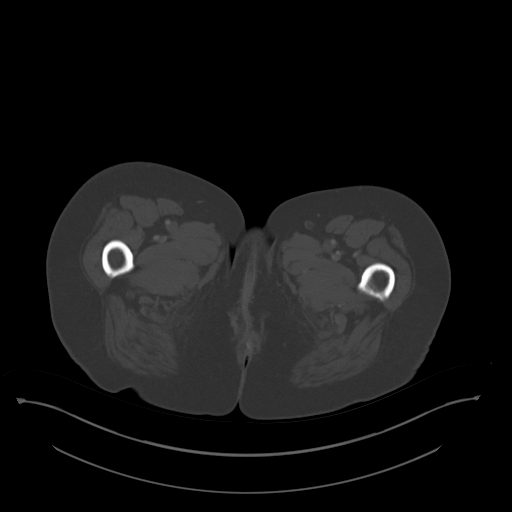
[im 10/81  soft-tissue]
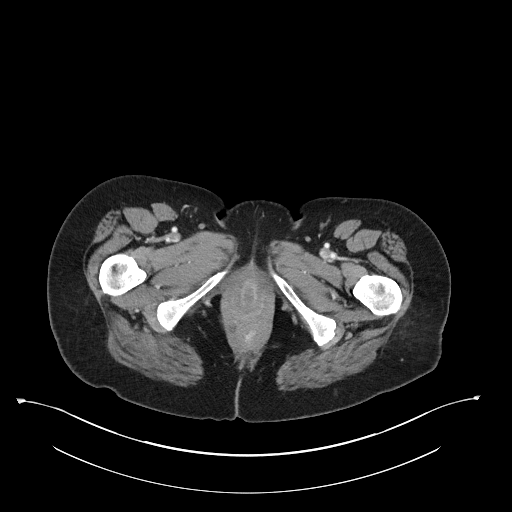
[im 19/81  soft-tissue]
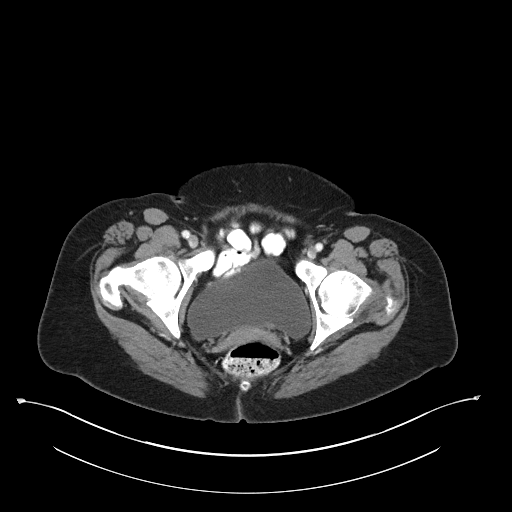
[im 24/81  soft-tissue]
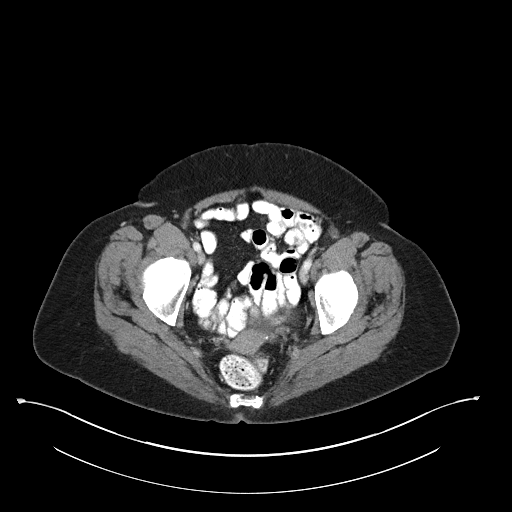
[im 29/81  soft-tissue]
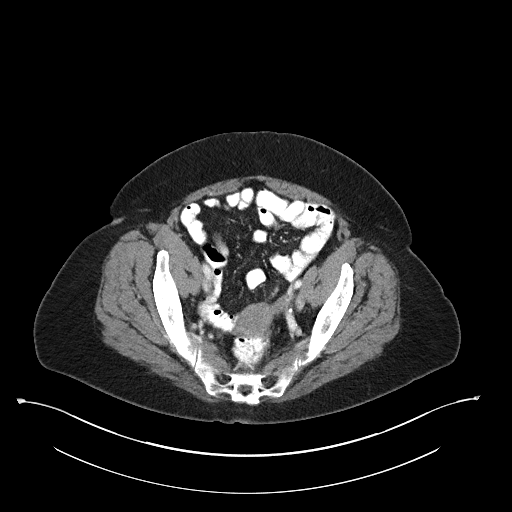
[im 33/81  soft-tissue]
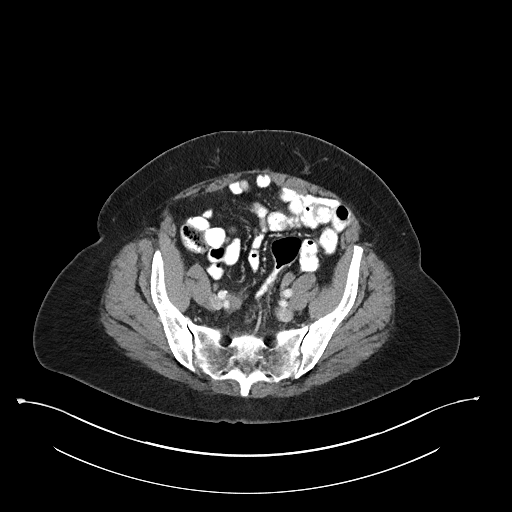
[im 43/81  soft-tissue]
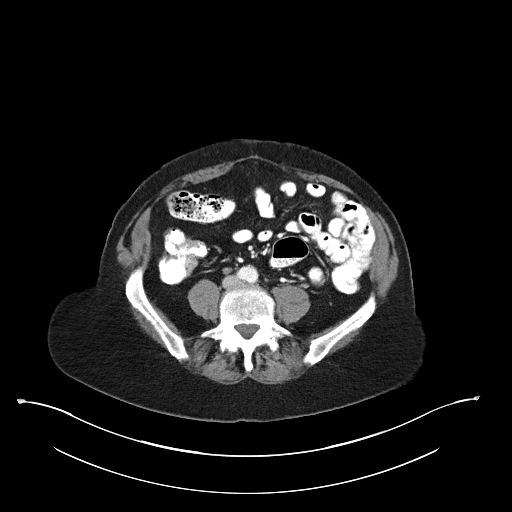
[im 48/81  soft-tissue]
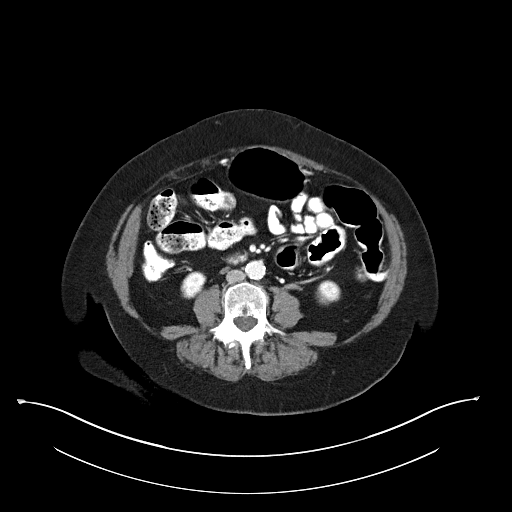
[im 52/81  soft-tissue]
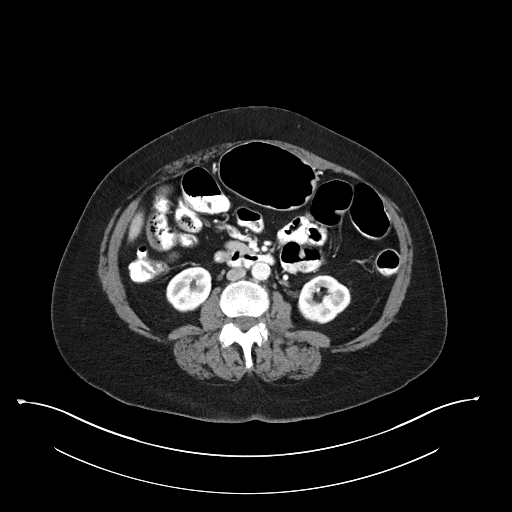
[im 52/81  bone]
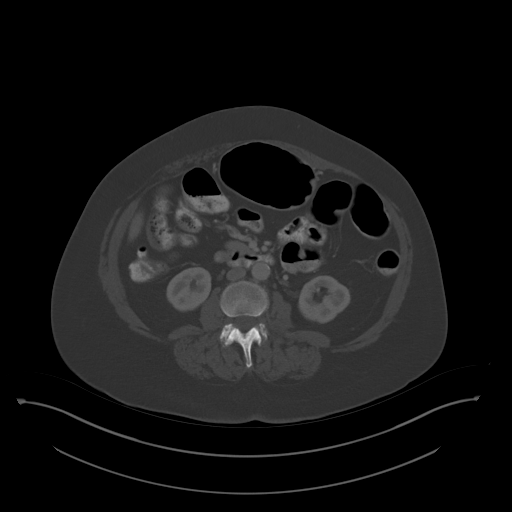
[im 57/81  soft-tissue]
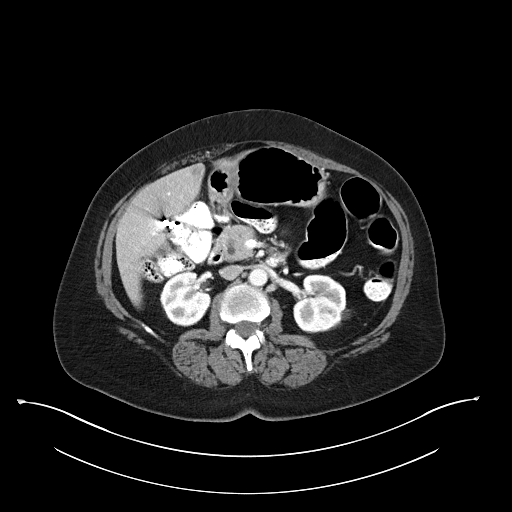
[im 62/81  soft-tissue]
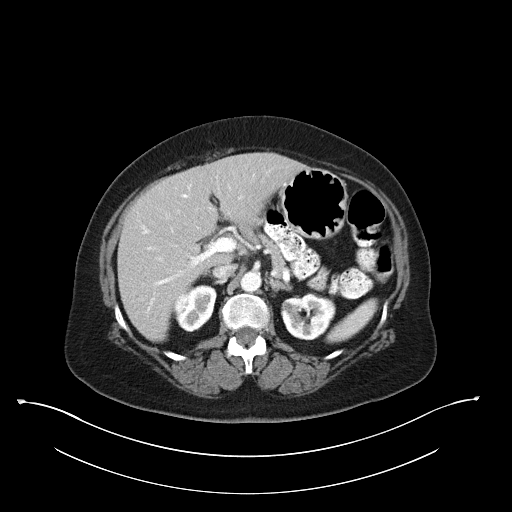
[im 71/81  soft-tissue]
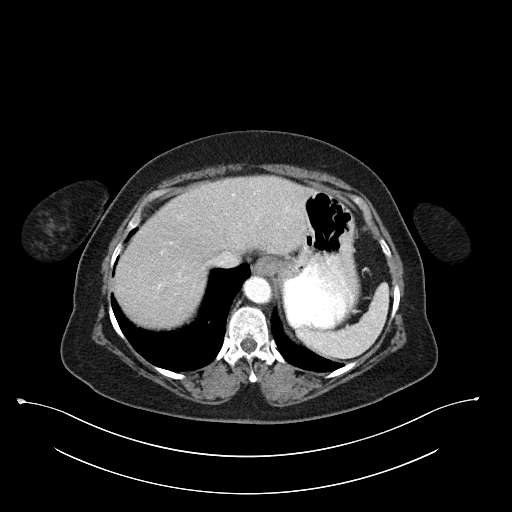
[im 76/81  soft-tissue]
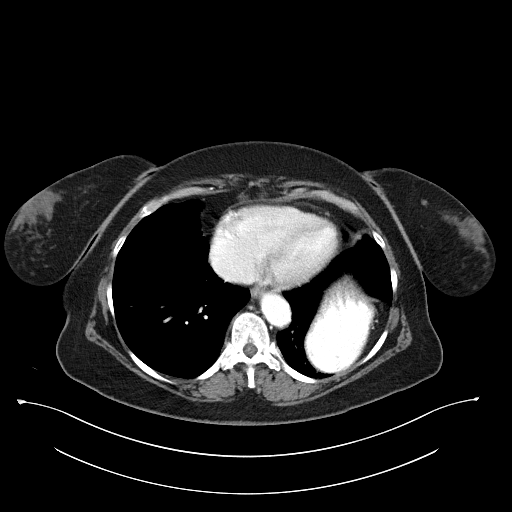

[Series 5: abd/pel w st · coronal · 0.78mm/px · 3 of 100 slices shown]
[im 34/100  soft-tissue]
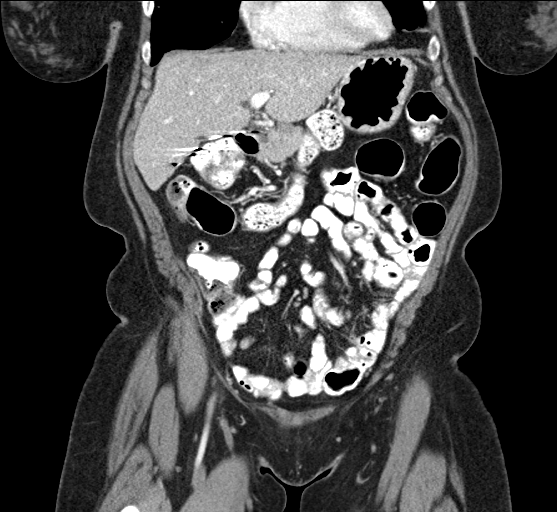
[im 45/100  soft-tissue]
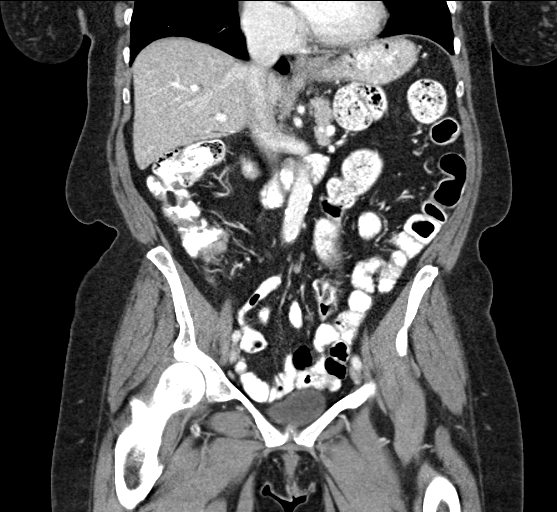
[im 56/100  soft-tissue]
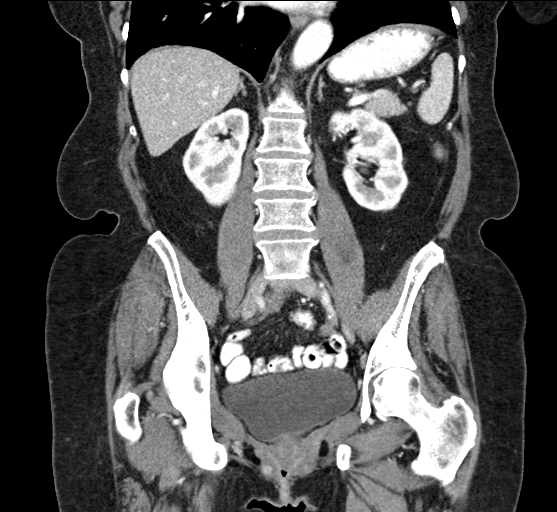

[16 of 46 positions shown; findings below may reference images not displayed]

FINDINGS: Lower chest: Right middle lobe 4 mm solid pulmonary nodule (series
3/image 2), stable since 05/25/2017 screening chest CT, considered
benign. Centrilobular emphysema. Coronary atherosclerosis.

Hepatobiliary: Normal liver size. No liver mass. Cholecystectomy.
Bile ducts are stable and within normal post cholecystectomy limits
with CBD diameter 7 mm.

Pancreas: Normal, with no mass or duct dilation.

Spleen: Normal size. No mass.

Adrenals/Urinary Tract: Normal adrenals. Simple left renal cysts,
largest an exophytic 1.5 cm lateral interpolar left renal cyst.
Additional subcentimeter hypodense bilateral renal cortical lesions,
too small to characterize, which require no follow-up. No
hydronephrosis. At least partially duplicated left renal collecting
system. Normal bladder.

Stomach/Bowel: Normal non-distended stomach. Normal caliber small
bowel with no small bowel wall thickening. Appendix not discretely
visualized. No pericecal inflammatory changes. Oral contrast
transits to the rectum. Normal large bowel with no diverticulosis,
large bowel wall thickening or pericolonic fat stranding.

Vascular/Lymphatic: Atherosclerotic nonaneurysmal abdominal aorta.
Patent portal, splenic, hepatic and renal veins. No pathologically
enlarged lymph nodes in the abdomen or pelvis.

Reproductive: Grossly normal uterus.  No adnexal mass.

Other: No pneumoperitoneum, ascites or focal fluid collection.

Musculoskeletal: No aggressive appearing focal osseous lesions.
IMPRESSION: 1. No acute abnormality. No evidence of bowel obstruction or acute
bowel inflammation. No adenopathy.
2. Chronic findings include: Aortic Atherosclerosis (KL13I-GRL.L)
and Emphysema (KL13I-S1M.M). Coronary atherosclerosis. At least
partially duplicated left renal collecting system.

## 2019-04-05 ENCOUNTER — Telehealth: Payer: Self-pay | Admitting: Gastroenterology

## 2019-04-05 NOTE — Telephone Encounter (Signed)
Patient called and states she had sharp lower abdominal pain last that lasted most of the night. She felt some better this am and has had it intermittently today. She had diarrhea Monday and now feels constipated and gassy. She has not eaten anything today. I suggested she take a dose or Miralax, drink plenty of fluids and eat some toast/crackers or something mild. Let her know she should take a second dose tomorrow morning if she has not gotten any relief and to call us back in 24-48 hours if she is not feeling better.

## 2019-04-18 ENCOUNTER — Other Ambulatory Visit: Payer: Self-pay

## 2019-04-18 ENCOUNTER — Ambulatory Visit (INDEPENDENT_AMBULATORY_CARE_PROVIDER_SITE_OTHER): Payer: Medicare HMO | Admitting: Internal Medicine

## 2019-04-18 ENCOUNTER — Encounter: Payer: Self-pay | Admitting: Internal Medicine

## 2019-04-18 VITALS — BP 122/70 | HR 88 | Temp 97.1°F | Ht 66.0 in | Wt 164.8 lb

## 2019-04-18 DIAGNOSIS — J9611 Chronic respiratory failure with hypoxia: Secondary | ICD-10-CM | POA: Diagnosis not present

## 2019-04-18 DIAGNOSIS — J449 Chronic obstructive pulmonary disease, unspecified: Secondary | ICD-10-CM | POA: Diagnosis not present

## 2019-04-18 MED ORDER — DOXYCYCLINE HYCLATE 100 MG PO TABS: 100.0000 mg | ORAL_TABLET | Freq: Two times a day (BID) | ORAL | 0 refills | Status: DC

## 2019-04-18 MED ORDER — PREDNISONE 10 MG PO TABS: ORAL_TABLET | ORAL | 0 refills | Status: DC

## 2019-04-18 NOTE — Patient Instructions (Signed)
ICD-10-CM   1. Stage 4 very severe COPD by GOLD classification ([place])  J44.9   2. Chronic respiratory failure with hypoxia (HCC)  J96.11       - stable disease with improved symptoms following night bipap - continue o2, flonase, arnuity, stiolto and night bipap scheduled  - take  2 refills for   - Take doxycycline 100mg  po twice daily x 5 days; take after meals and avoid sunlight  - Please take prednisone 40 mg x1 day, then 30 mg x1 day, then 20 mg x1 day, then 10 mg x1 day, and then 5 mg x1 day and stop   Followup 3-6 months or sooner if needed

## 2019-04-18 NOTE — Progress Notes (Signed)
07/12/2018 -   Chief Complaint  Patient presents with  . Follow-up    pt states breathing is baseline. c/o occ prod cough with yellowish to white mucus.     HPI Katie Greene 63 y.o. 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  She continues on nocturnal BiPAP.  This is really improved her symptom score.  COPD CAT score is now 14.  She is on triple inhaler therapy and oxygen as well.  #2019 CT scan of the chest did not show any evidence of lung cancer.  Annual screening is recommended.  Her new issues this visit are that she has unintentional weight loss of 12 pounds.  She is frustrated by this.  She has seen GI and is seeing a primary care physician for this.  She is also asking for travel advice encounter.  She is going to [place] for 4 days for the weekend with her friends to celebrate her recent divorce.  She is asking for preemptive antibiotic and prednisone    OV 04/18/2019  Subjective:  Patient ID: Katie Greene, female , DOB: May 06, 1956 , age 54 y.o. , MRN: [id number] , ADDRESS: [address] [place] [address]   04/18/2019 -   Chief Complaint  Patient presents with  . Stage 4 very severe COPD by GOLD classification    Breathing is the same as at last visit in December 2019     HPI [name] 63 y.o. -follows up for end-stage Gold stage IV COPD.  Last seen early part of 2020.  After the onset of the pandemic she has hunker down and socially distanced.  She has limited her activities to very low risk.  She goes out to the grocery store with a mask.  She has avoided people.  She continues to be on Arnuity and Stiolto and nighttime BiPAP with Flonase and daily schedule oxygen no new problems.  She just wants refills of doxycycline and prednisone in case of a future exacerbation.  But otherwise doing well.  CAT score shows stability.      CAT COPD Symptom & Quality of Life Score (GSK trademark) 0 is no burden. 5 is  highest burden 12/31/2016  03/09/2017  10/11/2017  01/26/2018  04/28/2018 With nigight bipap 07/12/2018 onging bipap qhs 04/18/2019   Never Cough -> Cough all the time 3 2 4 3 3 2 2   No phlegm in chest -> Chest is full of phlegm 3 2 3 4 2 2 1   No chest tightness -> Chest feels very tight 3 3 3 3  0 0 1  No dyspnea for 1 flight stairs/hill -> Very dyspneic for 1 flight of stairs 5 5 5 5 4 4 4   No limitations for ADL at home -> Very limited with ADL at home 5 3 5 5 5 2 2   Confident leaving home -> Not at all confident leaving home 2 5 3 4 3  0 1  Sleep soundly -> Do not sleep soundly because of lung condition 2 4 3 4 3 4 3   Lots of Energy -> No energy at all 4 3 3 3 4  0 3  TOTAL Score (max 40)  27 27 30 31 24 14 17       ROS - per HPI     has a past medical history of Allergy, Anxiety, Arthritis, Chest pain, Chronic respiratory failure ([place]), COPD (chronic obstructive pulmonary disease) ([place]), Depression, Emphysema of lung ([place]),  Fatigue, GERD (gastroesophageal reflux disease), Hemorrhoids, History of colon polyps, Hyperlipidemia, Hypertension, Iron deficiency anemia, On home oxygen therapy, Oxygen deficiency, Palpitations, Pulmonary nodules, Seasonal allergies, Wears dentures, and Wears glasses.   reports that she quit smoking about 7 years ago. Her smoking use included cigarettes. She has a 12.50 pack-year smoking history. She has never used smokeless tobacco.  Past Surgical History:  Procedure Laterality Date  . CHOLECYSTECTOMY     laparosopic attempted, then had to open  . COLONOSCOPY     x2 with polyps removed  . COLONOSCOPY N/A 08/27/2015   Procedure: COLONOSCOPY;  Surgeon: Manus Gunning, MD;  Location: [facility];  Service: Gastroenterology;  Laterality: N/A;  . COLONOSCOPY WITH PROPOFOL N/A 01/17/2019   Procedure: COLONOSCOPY WITH PROPOFOL;  Surgeon: Yetta Flock, MD;  Location: [facility];  Service: Gastroenterology;  Laterality: N/A;  . oopherectomy     1  ovary removed only  . POLYPECTOMY    . POLYPECTOMY  01/17/2019   Procedure: POLYPECTOMY;  Surgeon: Yetta Flock, MD;  Location: [facility];  Service: Gastroenterology;;  . SALPINGECTOMY     1 fallopian tube removed    Allergies  Allergen Reactions  . Daliresp [Roflumilast] Diarrhea, Nausea And Vomiting and Other (See Comments)    Upset stomach    Immunization History  Administered Date(s) Administered  . Fluad Quad(high Dose 65+) 03/29/2019  . Influenza Split 06/10/2012, 04/03/2014, 04/20/2015  . Influenza, High Dose Seasonal PF 04/09/2018  . Influenza,inj,Quad PF,6+ Mos 04/24/2013, 03/03/2016  . Influenza-Unspecified 05/29/2017  . Pneumococcal Conjugate-13 05/07/2015  . Pneumococcal Polysaccharide-23 06/10/2012  . Zoster 05/10/2014    Family History  Problem Relation Age of Onset  . Heart attack Mother   . Pancreatic cancer Mother   . Prostate cancer Father   . Hypertension Father   . Throat cancer Father   . Throat cancer Brother   . Prostate cancer Brother   . Colon cancer Sister   . Brain cancer Sister   . Stomach cancer Neg Hx   . Colon polyps Neg Hx   . Rectal cancer Neg Hx   . Esophageal cancer Neg Hx      Current Outpatient Medications:  .  Acetylcysteine (N-ACETYL-L-CYSTEINE) 600 MG CAPS, Take 600 mg by mouth 2 (two) times a day. , Disp: , Rfl:  .  albuterol (PROVENTIL) (2.5 MG/3ML) 0.083% nebulizer solution, Take 3 mLs (2.5 mg total) by nebulization every 4 (four) hours as needed for wheezing or shortness of breath. Dx code j19.9, Disp: 125 mL, Rfl: 6 .  albuterol (VENTOLIN HFA) 108 (90 Base) MCG/ACT inhaler, INHALE 1 TO 2 PUFFS INTO THE LUNGS EVERY 6 HOURS AS NEEDED FOR WHEEZING FOR SHORTNESS OF BREATH (Patient taking differently: Inhale 1-2 puffs into the lungs every 6 (six) hours as needed for wheezing or shortness of breath. ), Disp: 7 each, Rfl: 0 .  ALPRAZolam (XANAX) 0.5 MG tablet, Take 0.5 mg by mouth at bedtime as needed for anxiety or  sleep. , Disp: , Rfl:  .  AMBULATORY NON FORMULARY MEDICATION, VENTILATOR machine at night, Disp: , Rfl:  .  aspirin EC 81 MG tablet, Take 81 mg by mouth daily. , Disp: , Rfl:  .  buPROPion (WELLBUTRIN SR) 150 MG 12 hr tablet, Take 150 mg by mouth daily., Disp: , Rfl:  .  cetirizine (ZYRTEC) 10 MG tablet, Take 10 mg by mouth daily. , Disp: , Rfl:  .  diltiazem (CARDIZEM CD) 180 MG 24 hr capsule, Take 180 mg  by mouth daily. , Disp: , Rfl:  .  ferrous sulfate 325 (65 FE) MG tablet, Take 325 mg by mouth daily with breakfast., Disp: , Rfl:  .  fluticasone (FLONASE) 50 MCG/ACT nasal spray, Place 2 sprays into both nostrils daily. (Patient taking differently: Place 1 spray into both nostrils at bedtime. ), Disp: 16 g, Rfl: 5 .  Fluticasone Furoate (ARNUITY ELLIPTA) 100 MCG/ACT AEPB, Inhale 1 puff into the lungs daily., Disp: 30 each, Rfl: 5 .  hydrochlorothiazide (HYDRODIURIL) 25 MG tablet, Take 25 mg by mouth daily. , Disp: , Rfl:  .  hydrocortisone 1 % ointment, Pea size amount into rectum twice daily (Patient taking differently: Apply 1 application topically 2 (two) times daily as needed (hemorrhoids). Pea size amount into rectum twice daily as needed for hemorrhoids), Disp: 30 g, Rfl: 0 .  metoprolol succinate (TOPROL-XL) 50 MG 24 hr tablet, Take 50 mg by mouth every morning. Take with or immediately following a meal., Disp: , Rfl:  .  mirtazapine (REMERON) 30 MG tablet, Take 1 tablet (30 mg total) by mouth at bedtime. (Patient taking differently: Take 30 mg by mouth at bedtime as needed (sleep). ), Disp: 90 tablet, Rfl: 1 .  omeprazole (PRILOSEC) 20 MG capsule, Take 20 mg by mouth daily. , Disp: , Rfl:  .  OXYGEN, Inhale 2-3 L into the lungs daily., Disp: , Rfl:  .  pravastatin (PRAVACHOL) 80 MG tablet, Take 80 mg by mouth every morning. , Disp: , Rfl:  .  spironolactone (ALDACTONE) 50 MG tablet, Take 25 mg by mouth daily. , Disp: , Rfl:  .  STIOLTO RESPIMAT 2.5-2.5 MCG/ACT AERS, INHALE 2 PUFFS BY  MOUTH ONCE DAILY, Disp: 4 g, Rfl: 3 .  temazepam (RESTORIL) 30 MG capsule, Take 30 mg by mouth at bedtime as needed for sleep. , Disp: , Rfl: 4 .  VITAMIN D PO, Take 5,000 Units by mouth daily. , Disp: , Rfl:       Objective:   Vitals:   04/18/19 1016  BP: 122/70  Pulse: 88  Temp: (!) 97.1 F (36.2 C)  SpO2: 98%  Weight: 164 lb 12.8 oz (74.8 kg)  Height: 5\' 6"  (1.676 m)    Estimated body mass index is 26.6 kg/m as calculated from the following:   Height as of this encounter: 5\' 6"  (1.676 m).   Weight as of this encounter: 164 lb 12.8 oz (74.8 kg).  @WEIGHTCHANGE @  [employer]   04/18/19 1016  Weight: 164 lb 12.8 oz (74.8 kg)     Physical Exam  General Appearance:    Alert, cooperative, no distress, appears stated age - yes , Deconditioned looking - no , OBESE  - no, Sitting on Wheelchair -  no  Head:    Normocephalic, without obvious abnormality, atraumatic  Eyes:    PERRL, conjunctiva/corneas clear,  Ears:    Normal TM's and external ear canals, both ears  Nose:   Nares normal, septum midline, mucosa normal, no drainage    or sinus tenderness. OXYGEN ON  - yes . Patient is @ 3L   Throat:   Lips, mucosa, and tongue normal; teeth and gums normal. Cyanosis on lips - no  Neck:   Supple, symmetrical, trachea midline, no adenopathy;    thyroid:  no enlargement/tenderness/nodules; no carotid   bruit or JVD  Back:     Symmetric, no curvature, ROM normal, no CVA tenderness  Lungs:     Distress - no , Wheeze no, Rosey [place]  Chest - no, Purse lip breathing - no, Crackles - no   Chest Wall:    No tenderness or deformity.    Heart:    Regular rate and rhythm, S1 and S2 normal, no rub   or gallop, Murmur - no  Breast Exam:    NOT DONE  Abdomen:     Soft, non-tender, bowel sounds active all four quadrants,    no masses, no organomegaly. Visceral obesity - yes mild  Genitalia:   NOT DONE  Rectal:   NOT DONE  Extremities:   Extremities - normal, Has Cane - no, Clubbing - no, Edema  - no  Pulses:   2+ and symmetric all extremities  Skin:   Stigmata of Connective Tissue Disease - no  Lymph nodes:   Cervical, supraclavicular, and axillary nodes normal  Psychiatric:  Neurologic:   Pleasant - yes, Anxious - no, Flat affect - no  CAm-ICU - neg, Alert and Oriented x 3 - yes, Moves all 4s - yes, Speech - normal, Cognition - intact           Assessment:       ICD-10-CM   1. Stage 4 very severe COPD by GOLD classification ([place])  J44.9   2. Chronic respiratory failure with hypoxia ([facility])  J96.11        Plan:     Patient Instructions     ICD-10-CM   1. Stage 4 very severe COPD by GOLD classification ([place])  J44.9   2. Chronic respiratory failure with hypoxia ([facility])  J96.11       - stable disease with improved symptoms following night bipap - continue o2, flonase, arnuity, stiolto and night bipap scheduled  - take  2 refills for   - Take doxycycline 100mg  po twice daily x 5 days; take after meals and avoid sunlight  - Please take prednisone 40 mg x1 day, then 30 mg x1 day, then 20 mg x1 day, then 10 mg x1 day, and then 5 mg x1 day and stop   Followup 3-6 months or sooner if needed        SIGNATURE    Dr. Brand Males, M.D., F.C.C.P,  Pulmonary and Critical Care Medicine Staff Physician, [place] Director - Interstitial Lung Disease  Program  P[facility] [place] [facility] [place], [place], [address]  Pager: [phone], If no answer or between  15:00h - 7:00h: call [phone] Telephone: [phone]  10:51 AM 04/18/2019

## 2019-04-18 NOTE — Telephone Encounter (Signed)
MR please advise. Thanks! 

## 2019-04-18 NOTE — Addendum Note (Signed)
Addended by: Nena Polio on: 04/18/2019 10:55 AM   Modules accepted: Orders

## 2019-04-19 MED ORDER — PREDNISONE 10 MG PO TABS: ORAL_TABLET | ORAL | 2 refills | Status: DC

## 2019-04-19 MED ORDER — DOXYCYCLINE HYCLATE 100 MG PO TABS: 100.0000 mg | ORAL_TABLET | Freq: Two times a day (BID) | ORAL | 2 refills | Status: AC

## 2019-04-19 NOTE — Telephone Encounter (Signed)
pls send 2 refills on those so shee can get it as needed

## 2019-04-19 NOTE — Telephone Encounter (Signed)
Refills sent. Nothing further needed at this time.

## 2019-05-02 DIAGNOSIS — J9611 Chronic respiratory failure with hypoxia: Secondary | ICD-10-CM | POA: Diagnosis not present

## 2019-05-02 DIAGNOSIS — J449 Chronic obstructive pulmonary disease, unspecified: Secondary | ICD-10-CM | POA: Diagnosis not present

## 2019-05-14 ENCOUNTER — Other Ambulatory Visit: Payer: Self-pay | Admitting: Gastroenterology

## 2019-05-15 NOTE — Telephone Encounter (Signed)
Pt called back. She is still dealing with loss of appetite occasionally and would like a refill.  OK to send #[phone] with 1 refill? Thank you

## 2019-05-15 NOTE — Telephone Encounter (Signed)
Yes that's fine, thanks

## 2019-05-15 NOTE — Telephone Encounter (Signed)
Rec'd refill request from pharmacy for Remeron. Called and LM for pt to call back with update on weight loss. Is she still taking Remeron? Last saw Nicoletta Ba in the office on 09-2018 and weight loss had stabilized at that time.

## 2019-05-26 ENCOUNTER — Telehealth: Payer: Self-pay | Admitting: Acute Care

## 2019-05-26 DIAGNOSIS — Z87891 Personal history of nicotine dependence: Secondary | ICD-10-CM

## 2019-05-26 DIAGNOSIS — Z122 Encounter for screening for malignant neoplasm of respiratory organs: Secondary | ICD-10-CM

## 2019-05-29 NOTE — Telephone Encounter (Signed)
Spoke with pt and advised that she is due for low dose ct after 06/04/2019.  New order placed to have CT done at [facility].  Rodena Piety will contact pt to schedule.

## 2019-05-29 NOTE — Telephone Encounter (Signed)
Patient has her LCS CT scheduled 06/05/2019 @ 9:00am at [facility] CT Patient is aware of appt and location

## 2019-06-01 DIAGNOSIS — J9611 Chronic respiratory failure with hypoxia: Secondary | ICD-10-CM | POA: Diagnosis not present

## 2019-06-01 DIAGNOSIS — J449 Chronic obstructive pulmonary disease, unspecified: Secondary | ICD-10-CM | POA: Diagnosis not present

## 2019-06-05 ENCOUNTER — Other Ambulatory Visit: Payer: Self-pay

## 2019-06-05 ENCOUNTER — Ambulatory Visit (INDEPENDENT_AMBULATORY_CARE_PROVIDER_SITE_OTHER)
Admission: RE | Admit: 2019-06-05 | Discharge: 2019-06-05 | Disposition: A | Discharge status: Home / Self Care | Payer: Medicare HMO | Source: Ambulatory Visit | Attending: Acute Care | Admitting: Acute Care

## 2019-06-05 DIAGNOSIS — Z122 Encounter for screening for malignant neoplasm of respiratory organs: Secondary | ICD-10-CM

## 2019-06-05 DIAGNOSIS — Z87891 Personal history of nicotine dependence: Secondary | ICD-10-CM | POA: Diagnosis not present

## 2019-06-08 ENCOUNTER — Telehealth: Payer: Self-pay | Admitting: Acute Care

## 2019-06-08 DIAGNOSIS — Z87891 Personal history of nicotine dependence: Secondary | ICD-10-CM

## 2019-06-08 DIAGNOSIS — Z122 Encounter for screening for malignant neoplasm of respiratory organs: Secondary | ICD-10-CM

## 2019-06-08 NOTE — Telephone Encounter (Signed)
Pt informed of CT results per Sarah Groce, NP.  PT verbalized understanding.  Copy sent to PCP.  Order placed for 1 yr f/u CT.  

## 2019-06-12 ENCOUNTER — Telehealth: Payer: Self-pay | Admitting: Internal Medicine

## 2019-06-12 MED ORDER — ARNUITY ELLIPTA 100 MCG/ACT IN AEPB: 1.0000 | INHALATION_SPRAY | Freq: Every day | RESPIRATORY_TRACT | 3 refills | Status: DC

## 2019-06-12 MED ORDER — STIOLTO RESPIMAT 2.5-2.5 MCG/ACT IN AERS: 2.0000 | INHALATION_SPRAY | Freq: Every day | RESPIRATORY_TRACT | 3 refills | Status: DC

## 2019-06-12 MED ORDER — ALBUTEROL SULFATE HFA 108 (90 BASE) MCG/ACT IN AERS: 1.0000 | INHALATION_SPRAY | Freq: Four times a day (QID) | RESPIRATORY_TRACT | 3 refills | Status: DC | PRN

## 2019-06-12 NOTE — Telephone Encounter (Signed)
All rxs requested sent to pharm

## 2019-06-13 DIAGNOSIS — E7849 Other hyperlipidemia: Secondary | ICD-10-CM | POA: Diagnosis not present

## 2019-06-14 ENCOUNTER — Encounter: Payer: Self-pay | Admitting: Internal Medicine

## 2019-06-14 ENCOUNTER — Ambulatory Visit (INDEPENDENT_AMBULATORY_CARE_PROVIDER_SITE_OTHER): Payer: Medicare HMO | Admitting: Internal Medicine

## 2019-06-14 ENCOUNTER — Other Ambulatory Visit: Payer: Self-pay

## 2019-06-14 VITALS — BP 108/64 | HR 91 | Ht 66.0 in | Wt 165.0 lb

## 2019-06-14 DIAGNOSIS — I10 Essential (primary) hypertension: Secondary | ICD-10-CM | POA: Diagnosis not present

## 2019-06-14 DIAGNOSIS — Z87891 Personal history of nicotine dependence: Secondary | ICD-10-CM | POA: Diagnosis not present

## 2019-06-14 DIAGNOSIS — J449 Chronic obstructive pulmonary disease, unspecified: Secondary | ICD-10-CM | POA: Diagnosis not present

## 2019-06-14 DIAGNOSIS — J9611 Chronic respiratory failure with hypoxia: Secondary | ICD-10-CM

## 2019-06-14 DIAGNOSIS — R82998 Other abnormal findings in urine: Secondary | ICD-10-CM | POA: Diagnosis not present

## 2019-06-14 NOTE — Progress Notes (Signed)
07/12/2018 -   Chief Complaint  Patient presents with  . Follow-up    pt states breathing is baseline. c/o occ prod cough with yellowish to white mucus.     HPI Katie Greene 63 y.o. 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  She continues on nocturnal BiPAP.  This is really improved her symptom score.  COPD CAT score is now 14.  She is on triple inhaler therapy and oxygen as well.  #2019 CT scan of the chest did not show any evidence of lung cancer.  Annual screening is recommended.  Her new issues this visit are that she has unintentional weight loss of 12 pounds.  She is frustrated by this.  She has seen GI and is seeing a primary care physician for this.  She is also asking for travel advice encounter.  She is going to [place] for 4 days for the weekend with her friends to celebrate her recent divorce.  She is asking for preemptive antibiotic and prednisone    OV 04/18/2019  Subjective:  Patient ID: Katie Greene, female , DOB: 01/31/1956 , age 63 y.o. , MRN: [id number] , ADDRESS: [address] [place] [address]   04/18/2019 -   Chief Complaint  Patient presents with  . Stage 4 very severe COPD by GOLD classification    Breathing is the same as at last visit in December 2019     HPI Katie Greene 63 y.o. -follows up for end-stage Gold stage IV COPD.  Last seen early part of 2020.  After the onset of the pandemic she has hunker down and socially distanced.  She has limited her activities to very low risk.  She goes out to the grocery store with a mask.  She has avoided people.  She continues to be on Arnuity and Stiolto and nighttime BiPAP with Flonase and daily schedule oxygen no new problems.  She just wants refills of doxycycline and prednisone in case of a future exacerbation.  But otherwise doing well.  CAT score shows stability.         OV 06/14/2019  Subjective:  Patient ID: Katie Greene, female ,  DOB: Apr 07, 1956 , age 63 y.o. , MRN: [id number] , ADDRESS: [address] [place] [address]   06/14/2019 -   Chief Complaint  Patient presents with  . Follow-up    Pt states she has been doing well since last visit and denies any complaints.   Follow-up stage IV COPD with chronic hypoxemic respiratory failure  HPI Katie Greene 63 y.o. -presents for follow-up.  Last saw her 2 months ago.  She was supposed to come between 3 and 6 months.  However due to some scheduling issues she is back in 2 months.  She has no new symptoms.  COPD CAT score is listed below and stable.  She is on oxygen daytime, Flonase, Arnuity, Stiolto and night BiPAP.  She is doing well.  She had a low-dose CT scan of the chest and findings of benign and a repeat CT scan in 1 year has been recommended.    CAT COPD Symptom & Quality of Life Score (GSK trademark) 0 is no burden. 5 is highest burden 12/31/2016  03/09/2017  10/11/2017  01/26/2018  04/28/2018 With nigight bipap 07/12/2018 onging bipap qhs 04/18/2019  06/14/2019   Never Cough -> Cough all the time 3 2 4 3 3 2 2  2  No phlegm in chest -> Chest is full of phlegm 3 2 3 4 2 2 1 2   No chest tightness -> Chest feels very tight 3 3 3 3  0 0 1 1  No dyspnea for 1 flight stairs/hill -> Very dyspneic for 1 flight of stairs 5 5 5 5 4 4 4 4   No limitations for ADL at home -> Very limited with ADL at home 5 3 5 5 5 2 2 2   Confident leaving home -> Not at all confident leaving home 2 5 3 4 3  0 1 2  Sleep soundly -> Do not sleep soundly because of lung condition 2 4 3 4 3 4 3 2   Lots of Energy -> No energy at all 4 3 3 3 4  0 3 3  TOTAL Score (max 40)  27 27 30 31 24 14 17 18     IMPRESSION: 1. Lung-RADS 2, benign appearance or behavior. Continue annual screening with low-dose chest CT without contrast in 12 months. 2. Aortic Atherosclerosis (ICD10-I70.0) and Emphysema (ICD10-J43.9).   Electronically Signed   By: Misty Stanley M.D.   On: 06/05/2019 11:48  ROS - per HPI     has a past medical history of Allergy, Anxiety, Arthritis, Chest pain, Chronic respiratory failure ([place]), COPD (chronic obstructive pulmonary disease) ([place]), Depression, Emphysema of lung (HCC), Fatigue, GERD (gastroesophageal reflux disease), Hemorrhoids, History of colon polyps, Hyperlipidemia, Hypertension, Iron deficiency anemia, On home oxygen therapy, Oxygen deficiency, Palpitations, Pulmonary nodules, Seasonal allergies, Wears dentures, and Wears glasses.   reports that she quit smoking about 7 years ago. Her smoking use included cigarettes. She has a 12.50 pack-year smoking history. She has never used smokeless tobacco.  Past Surgical History:  Procedure Laterality Date  . CHOLECYSTECTOMY     laparosopic attempted, then had to open  . COLONOSCOPY     x2 with polyps removed  . COLONOSCOPY N/A 08/27/2015   Procedure: COLONOSCOPY;  Surgeon: Manus Gunning, MD;  Location: [facility];  Service: Gastroenterology;  Laterality: N/A;  . COLONOSCOPY WITH PROPOFOL N/A 01/17/2019   Procedure: COLONOSCOPY WITH PROPOFOL;  Surgeon: Yetta Flock, MD;  Location: [facility];  Service: Gastroenterology;  Laterality: N/A;  . oopherectomy     1 ovary removed only  . POLYPECTOMY    . POLYPECTOMY  01/17/2019   Procedure: POLYPECTOMY;  Surgeon: Yetta Flock, MD;  Location: [facility];  Service: Gastroenterology;;  . SALPINGECTOMY     1 fallopian tube removed    Allergies  Allergen Reactions  . Daliresp [Roflumilast] Diarrhea, Nausea And Vomiting and Other (See Comments)    Upset stomach    Immunization History  Administered Date(s) Administered  . Fluad Quad(high Dose 65+) 03/29/2019  . Influenza Split 06/10/2012, 04/03/2014, 04/20/2015  . Influenza, High Dose Seasonal PF 04/09/2018  . Influenza,inj,Quad PF,6+ Mos 04/24/2013, 03/03/2016  . Influenza-Unspecified 05/29/2017  . Pneumococcal Conjugate-13 05/07/2015  . Pneumococcal Polysaccharide-23  06/10/2012  . Zoster 05/10/2014    Family History  Problem Relation Age of Onset  . Heart attack Mother   . Pancreatic cancer Mother   . Prostate cancer Father   . Hypertension Father   . Throat cancer Father   . Throat cancer Brother   . Prostate cancer Brother   . Colon cancer Sister   . Brain cancer Sister   . Stomach cancer Neg Hx   . Colon polyps Neg Hx   . Rectal cancer Neg Hx   . Esophageal cancer Neg Hx  Current Outpatient Medications:  .  Acetylcysteine (N-ACETYL-L-CYSTEINE) 600 MG CAPS, Take 600 mg by mouth 2 (two) times a day. , Disp: , Rfl:  .  albuterol (PROVENTIL) (2.5 MG/3ML) 0.083% nebulizer solution, Take 3 mLs (2.5 mg total) by nebulization every 4 (four) hours as needed for wheezing or shortness of breath. Dx code j44.9, Disp: 125 mL, Rfl: 6 .  albuterol (VENTOLIN HFA) 108 (90 Base) MCG/ACT inhaler, Inhale 1-2 puffs into the lungs every 6 (six) hours as needed for wheezing or shortness of breath., Disp: 24 g, Rfl: 3 .  ALPRAZolam (XANAX) 0.5 MG tablet, Take 0.5 mg by mouth at bedtime as needed for anxiety or sleep. , Disp: , Rfl:  .  AMBULATORY NON FORMULARY MEDICATION, VENTILATOR machine at night, Disp: , Rfl:  .  aspirin EC 81 MG tablet, Take 81 mg by mouth daily. , Disp: , Rfl:  .  buPROPion (WELLBUTRIN SR) 150 MG 12 hr tablet, Take 150 mg by mouth daily., Disp: , Rfl:  .  cetirizine (ZYRTEC) 10 MG tablet, Take 10 mg by mouth daily. , Disp: , Rfl:  .  diltiazem (CARDIZEM CD) 180 MG 24 hr capsule, Take 180 mg by mouth daily. , Disp: , Rfl:  .  ferrous sulfate 325 (65 FE) MG tablet, Take 325 mg by mouth daily with breakfast., Disp: , Rfl:  .  fluticasone (FLONASE) 50 MCG/ACT nasal spray, Place 2 sprays into both nostrils daily. (Patient taking differently: Place 1 spray into both nostrils at bedtime. ), Disp: 16 g, Rfl: 5 .  Fluticasone Furoate (ARNUITY ELLIPTA) 100 MCG/ACT AEPB, Inhale 1 puff into the lungs daily., Disp: 90 each, Rfl: 3 .   hydrochlorothiazide (HYDRODIURIL) 25 MG tablet, Take 25 mg by mouth daily. , Disp: , Rfl:  .  hydrocortisone 1 % ointment, Pea size amount into rectum twice daily (Patient taking differently: Apply 1 application topically 2 (two) times daily as needed (hemorrhoids). Pea size amount into rectum twice daily as needed for hemorrhoids), Disp: 30 g, Rfl: 0 .  metoprolol succinate (TOPROL-XL) 50 MG 24 hr tablet, Take 50 mg by mouth every morning. Take with or immediately following a meal., Disp: , Rfl:  .  mirtazapine (REMERON) 30 MG tablet, TAKE 1 TABLET AT BEDTIME, Disp: 90 tablet, Rfl: 1 .  omeprazole (PRILOSEC) 20 MG capsule, Take 20 mg by mouth daily. , Disp: , Rfl:  .  OXYGEN, Inhale 2-3 L into the lungs daily., Disp: , Rfl:  .  pravastatin (PRAVACHOL) 80 MG tablet, Take 80 mg by mouth every morning. , Disp: , Rfl:  .  predniSONE (DELTASONE) 10 MG tablet, Take 4 tablets x 1 day, 3 tablets x 1 day, 2 tablets x 1 day, 1 tablet x 1 day, 1/2 tablet x 1 day then stop., Disp: 11 tablet, Rfl: 2 .  spironolactone (ALDACTONE) 50 MG tablet, Take 25 mg by mouth daily. , Disp: , Rfl:  .  temazepam (RESTORIL) 30 MG capsule, Take 30 mg by mouth at bedtime as needed for sleep. , Disp: , Rfl: 4 .  Tiotropium Bromide-Olodaterol (STIOLTO RESPIMAT) 2.5-2.5 MCG/ACT AERS, Inhale 2 puffs into the lungs daily., Disp: 12 g, Rfl: 3 .  VITAMIN D PO, Take 5,000 Units by mouth daily. , Disp: , Rfl:       Objective:   Vitals:   06/14/19 1213  BP: 108/64  Pulse: 91  SpO2: 96%  Weight: 165 lb (74.8 kg)  Height: 5\' 6"  (1.676 m)    Estimated  body mass index is 26.63 kg/m as calculated from the following:   Height as of this encounter: 5\' 6"  (1.676 m).   Weight as of this encounter: 165 lb (74.8 kg).  @WEIGHTCHANGE @  [employer]   06/14/19 1213  Weight: 165 lb (74.8 kg)     Physical Exam  General Appearance:    Alert, cooperative, no distress, appears stated age - yes , Deconditioned looking - no , OBESE  -  yes, Sitting on Wheelchair -  no  Head:    Normocephalic, without obvious abnormality, atraumatic  Eyes:    PERRL, conjunctiva/corneas clear,  Ears:    Normal TM's and external ear canals, both ears  Nose:   Nares normal, septum midline, mucosa normal, no drainage    or sinus tenderness. OXYGEN ON  - yes . Patient is @ 3l   Throat:   Lips, mucosa, and tongue normal; teeth and gums normal. Cyanosis on lips - no  Neck:   Supple, symmetrical, trachea midline, no adenopathy;    thyroid:  no enlargement/tenderness/nodules; no carotid   bruit or JVD  Back:     Symmetric, no curvature, ROM normal, no CVA tenderness  Lungs:     Distress - no , Wheeze no, Barrell Chest - yes, Purse lip breathing - no, Crackles - no   Chest Wall:    No tenderness or deformity.    Heart:    Regular rate and rhythm, S1 and S2 normal, no rub   or gallop, Murmur - no  Breast Exam:    NOT DONE  Abdomen:     Soft, non-tender, bowel sounds active all four quadrants,    no masses, no organomegaly. Visceral obesity - yes  Genitalia:   NOT DONE  Rectal:   NOT DONE  Extremities:   Extremities - normal, Has Cane - no, Clubbing - no, Edema - no  Pulses:   2+ and symmetric all extremities  Skin:   Stigmata of Connective Tissue Disease - no  Lymph nodes:   Cervical, supraclavicular, and axillary nodes normal  Psychiatric:  Neurologic:   Pleasant - yes, Anxious - no, Flat affect - no  CAm-ICU - neg, Alert and Oriented x 3 - yes, Moves all 4s - yes, Speech - normal, Cognition - intact           Assessment:       ICD-10-CM   1. Stage 4 very severe COPD by GOLD classification ([place])  J44.9   2. Chronic respiratory failure with hypoxia (HCC)  J96.11   3. Former smoker  Z87.891        Plan:     Patient Instructions     ICD-10-CM   1. Stage 4 very severe COPD by GOLD classification ([place])  J44.9   2. Chronic respiratory failure with hypoxia (HCC)  J96.11   3. Former smoker  Z87.891        - stable disease  without flare up - continue o2, flonase, arnuity, stiolto and night bipap scheduled  -albuterol as needed   Followup 4-6 months or sooner if needed; CAT score at followup        SIGNATURE    Dr. Brand Males, M.D., F.C.C.P,  Pulmonary and Critical Care Medicine Staff Physician, [place] Director - Interstitial Lung Disease  Program  P[facility] [place] at [place], [place], [address]  Pager: [phone], If no answer or between  15:00h - 7:00h: call [phone]  [place] Telephone: [phone]  12:38 PM 06/14/2019

## 2019-06-14 NOTE — Patient Instructions (Addendum)
ICD-10-CM   1. Stage 4 very severe COPD by GOLD classification ([place])  J44.9   2. Chronic respiratory failure with hypoxia (HCC)  J96.11   3. Former smoker  Z87.891        - stable disease without flare up - continue o2, flonase, arnuity, stiolto and night bipap scheduled  -albuterol as needed   Followup 4-6 months or sooner if needed; CAT score at followup

## 2019-06-20 DIAGNOSIS — E785 Hyperlipidemia, unspecified: Secondary | ICD-10-CM | POA: Diagnosis not present

## 2019-06-20 DIAGNOSIS — J439 Emphysema, unspecified: Secondary | ICD-10-CM | POA: Diagnosis not present

## 2019-06-20 DIAGNOSIS — J449 Chronic obstructive pulmonary disease, unspecified: Secondary | ICD-10-CM | POA: Diagnosis not present

## 2019-06-20 DIAGNOSIS — I10 Essential (primary) hypertension: Secondary | ICD-10-CM | POA: Diagnosis not present

## 2019-06-20 DIAGNOSIS — M25562 Pain in left knee: Secondary | ICD-10-CM | POA: Diagnosis not present

## 2019-06-20 DIAGNOSIS — Z0000 Encounter for general adult medical examination without abnormal findings: Secondary | ICD-10-CM | POA: Diagnosis not present

## 2019-06-20 DIAGNOSIS — F329 Major depressive disorder, single episode, unspecified: Secondary | ICD-10-CM | POA: Diagnosis not present

## 2019-06-20 DIAGNOSIS — I2781 Cor pulmonale (chronic): Secondary | ICD-10-CM | POA: Diagnosis not present

## 2019-06-20 DIAGNOSIS — K219 Gastro-esophageal reflux disease without esophagitis: Secondary | ICD-10-CM | POA: Diagnosis not present

## 2019-06-20 DIAGNOSIS — Z9981 Dependence on supplemental oxygen: Secondary | ICD-10-CM | POA: Diagnosis not present

## 2019-07-02 DIAGNOSIS — J9611 Chronic respiratory failure with hypoxia: Secondary | ICD-10-CM | POA: Diagnosis not present

## 2019-07-02 DIAGNOSIS — J449 Chronic obstructive pulmonary disease, unspecified: Secondary | ICD-10-CM | POA: Diagnosis not present

## 2019-07-17 DIAGNOSIS — M25562 Pain in left knee: Secondary | ICD-10-CM | POA: Diagnosis not present

## 2019-07-19 DIAGNOSIS — Z1212 Encounter for screening for malignant neoplasm of rectum: Secondary | ICD-10-CM | POA: Diagnosis not present

## 2019-07-22 ENCOUNTER — Other Ambulatory Visit: Payer: Self-pay | Admitting: Internal Medicine

## 2019-08-01 DIAGNOSIS — J9611 Chronic respiratory failure with hypoxia: Secondary | ICD-10-CM | POA: Diagnosis not present

## 2019-08-01 DIAGNOSIS — J449 Chronic obstructive pulmonary disease, unspecified: Secondary | ICD-10-CM | POA: Diagnosis not present

## 2019-09-01 ENCOUNTER — Ambulatory Visit: Payer: Medicare HMO

## 2019-09-01 DIAGNOSIS — J9611 Chronic respiratory failure with hypoxia: Secondary | ICD-10-CM | POA: Diagnosis not present

## 2019-09-01 DIAGNOSIS — J449 Chronic obstructive pulmonary disease, unspecified: Secondary | ICD-10-CM | POA: Diagnosis not present

## 2019-09-11 ENCOUNTER — Ambulatory Visit: Payer: Medicare HMO

## 2019-09-19 ENCOUNTER — Other Ambulatory Visit: Payer: Self-pay

## 2019-09-19 MED ORDER — MIRTAZAPINE 30 MG PO TABS: 30.0000 mg | ORAL_TABLET | Freq: Every day | ORAL | 1 refills | Status: DC

## 2019-09-22 ENCOUNTER — Ambulatory Visit: Payer: Medicare HMO

## 2019-10-01 DIAGNOSIS — J9611 Chronic respiratory failure with hypoxia: Secondary | ICD-10-CM | POA: Diagnosis not present

## 2019-10-01 DIAGNOSIS — J449 Chronic obstructive pulmonary disease, unspecified: Secondary | ICD-10-CM | POA: Diagnosis not present

## 2019-10-04 ENCOUNTER — Telehealth: Payer: Self-pay | Admitting: Internal Medicine

## 2019-10-04 NOTE — Telephone Encounter (Signed)
Will give to MR tomorrow 3/4 when he is in office.

## 2019-10-04 NOTE — Telephone Encounter (Signed)
Will forward to [place] to f/u on thanks

## 2019-10-05 NOTE — Telephone Encounter (Signed)
Handicap placard application given to MR for him to sign

## 2019-10-09 NOTE — Telephone Encounter (Signed)
Dr. Chase Caller do you have Ms. Rekowski's application so that we can get her handicap placard taken care of? If so please let us know so that we can get it taken care of.

## 2019-10-09 NOTE — Telephone Encounter (Signed)
Done and is on my desk on the side

## 2019-10-10 NOTE — Telephone Encounter (Signed)
Handicap placard has been given back to me by MR. Per my last conversation with pt, she stated to hold onto it until her up coming OV with MR 3/10. I will give handicap placard to pt at upcoming OV. Nothing further needed.

## 2019-10-11 ENCOUNTER — Encounter: Payer: Self-pay | Admitting: Internal Medicine

## 2019-10-11 ENCOUNTER — Ambulatory Visit: Payer: Medicare HMO | Admitting: Internal Medicine

## 2019-10-11 ENCOUNTER — Other Ambulatory Visit: Payer: Self-pay

## 2019-10-11 VITALS — BP 122/68 | HR 91 | Ht 66.0 in | Wt 168.0 lb

## 2019-10-11 DIAGNOSIS — Z87891 Personal history of nicotine dependence: Secondary | ICD-10-CM

## 2019-10-11 DIAGNOSIS — J9611 Chronic respiratory failure with hypoxia: Secondary | ICD-10-CM | POA: Diagnosis not present

## 2019-10-11 DIAGNOSIS — Z129 Encounter for screening for malignant neoplasm, site unspecified: Secondary | ICD-10-CM

## 2019-10-11 DIAGNOSIS — J449 Chronic obstructive pulmonary disease, unspecified: Secondary | ICD-10-CM

## 2019-10-11 DIAGNOSIS — B370 Candidal stomatitis: Secondary | ICD-10-CM | POA: Diagnosis not present

## 2019-10-11 MED ORDER — NYSTATIN 100000 UNIT/ML MT SUSP: 5.0000 mL | Freq: Four times a day (QID) | OROMUCOSAL | 1 refills | Status: DC

## 2019-10-11 NOTE — Patient Instructions (Addendum)
Stage 4 very severe COPD by GOLD classification ([place]) Chronic respiratory failure with hypoxia ([place]) Former smoker  - stable disease without flare up - continue o2, flonase, arnuity, stiolto and night bipap scheduled  -albuterol as needed   Cancer screening  -CT scan November 2020 without any evidence of lung cancer   Plan -Repeat low-dose CT scan of the chest in 9-12 months from March 2021 which would be January-March 2022  Oral thrush -new finding - # Oral thrush - For Oral thrush: Take Suspension (swish and swallow): 500,000 units 4 times/day for 5 days; swish in the mouth and retain for as long as possible (several minutes) before swallowing. IF nystatin is in back order: alternatives are clotrimazole troche (throt lozenge) 10mg  dissolved and swish and swallow 5 times a day for 14 days.      Followup 4-6 months or sooner if needed; CAT score at followup

## 2019-10-11 NOTE — Progress Notes (Signed)
07/12/2018 -   Chief Complaint  Patient presents with  . Follow-up    pt states breathing is baseline. c/o occ prod cough with yellowish to white mucus.     HPI Katie Greene 64 y.o. 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  She continues on nocturnal BiPAP.  This is really improved her symptom score.  COPD CAT score is now 14.  She is on triple inhaler therapy and oxygen as well.  #2019 CT scan of the chest did not show any evidence of lung cancer.  Annual screening is recommended.  Her new issues this visit are that she has unintentional weight loss of 12 pounds.  She is frustrated by this.  She has seen GI and is seeing a primary care physician for this.  She is also asking for travel advice encounter.  She is going to [place] for 4 days for the weekend with her friends to celebrate her recent divorce.  She is asking for preemptive antibiotic and prednisone    OV 04/18/2019  Subjective:  Patient ID: Katie Greene, female , DOB: Aug 18, 1955 , age 2 y.o. , MRN: [id number] , ADDRESS: [address] [place] [address]   04/18/2019 -   Chief Complaint  Patient presents with  . Stage 4 very severe COPD by GOLD classification    Breathing is the same as at last visit in December 2019     HPI Katie Greene 64 y.o. -follows up for end-stage Gold stage IV COPD.  Last seen early part of 2020.  After the onset of the pandemic she has hunker down and socially distanced.  She has limited her activities to very low risk.  She goes out to the grocery store with a mask.  She has avoided people.  She continues to be on Arnuity and Stiolto and nighttime BiPAP with Flonase and daily schedule oxygen no new problems.  She just wants refills of doxycycline and prednisone in case of a future exacerbation.  But otherwise doing well.  CAT score shows stability.         OV 06/14/2019  Subjective:  Patient ID: Katie Greene, female , DOB:  Apr 28, 1956 , age 72 y.o. , MRN: [id number] , ADDRESS: [address] [place] [address]   06/14/2019 -   Chief Complaint  Patient presents with  . Follow-up    Pt states she has been doing well since last visit and denies any complaints.   Follow-up stage IV COPD with chronic hypoxemic respiratory failure  HPI Katie Greene 64 y.o. -presents for follow-up.  Last saw her 2 months ago.  She was supposed to come between 3 and 6 months.  However due to some scheduling issues she is back in 2 months.  She has no new symptoms.  COPD CAT score is listed below and stable.  She is on oxygen daytime, Flonase, Arnuity, Stiolto and night BiPAP.  She is doing well.  She had a low-dose CT scan of the chest and findings of benign and a repeat CT scan in 1 year has been recommended.    IMPRESSION: 1. Lung-RADS 2, benign appearance or behavior. Continue annual screening with low-dose chest CT without contrast in 12 months. 2. Aortic Atherosclerosis (ICD10-I70.0) and Emphysema (ICD10-J43.9).   Electronically Signed   By: Misty Stanley M.D.   On: 06/05/2019 11:48 ROS - per HPI   OV 10/11/2019  Subjective:  Patient  ID: Katie Greene, female , DOB: 12-19-1955 , age 18 y.o. , MRN: [id number] , ADDRESS: [address] [place] [address]   10/11/2019 -   Chief Complaint  Patient presents with  . Follow-up    Pt states she has been doing good since last visit and denies any current complaints.     HPI Katie Greene 64 y.o. -presents for COPD follow-up.  Last seen January 2020.  She continues on triple inhaler therapy oxygen at nighttime BiPAP.  Overall stable.  She has had a Covid vaccine.  She is isolating well in social distancing and masking.  Cancer screening: Last CT scan of the chest low-dose CT chest Nov 2020 without any evidence of cancer.  Follow-up recommended in 1 year.  Her advanced COPD makes it difficult to have any successful intervention.  She is agreed to  have her CT chest in 15 months - 18 months which would be in early 2022.  I reviewed and interpreted the result myself.  New issue: On exam she had oral thrush.  This is new.  Last nystatin intake was over a year ago according to her history.   CAT COPD Symptom & Quality of Life Score (GSK trademark) 0 is no burden. 5 is highest burden 12/31/2016  03/09/2017  10/11/2017  01/26/2018  04/28/2018 With nigight bipap 07/12/2018 onging bipap qhs 04/18/2019  06/14/2019   Never Cough -> Cough all the time 3 2 4 3 3 2 2 2   No phlegm in chest -> Chest is full of phlegm 3 2 3 4 2 2 1 2   No chest tightness -> Chest feels very tight 3 3 3 3  0 0 1 1  No dyspnea for 1 flight stairs/hill -> Very dyspneic for 1 flight of stairs 5 5 5 5 4 4 4 4   No limitations for ADL at home -> Very limited with ADL at home 5 3 5 5 5 2 2 2   Confident leaving home -> Not at all confident leaving home 2 5 3 4 3  0 1 2  Sleep soundly -> Do not sleep soundly because of lung condition 2 4 3 4 3 4 3 2   Lots of Energy -> No energy at all 4 3 3 3 4  0 3 3  TOTAL Score (max 40)  27 27 30 31 24 14 17 18     IMPRESSION: 1. Lung-RADS 2, benign appearance or behavior. Continue annual screening with low-dose chest CT without contrast in 12 months. 2. Aortic Atherosclerosis (ICD10-I70.0) and Emphysema (ICD10-J43.9).   Electronically Signed   By: Misty Stanley M.D.   On: 06/05/2019 11:48 ROS - per HPI     has a past medical history of Allergy, Anxiety, Arthritis, Chest pain, Chronic respiratory failure ([place]), COPD (chronic obstructive pulmonary disease) ([place]), Depression, Emphysema of lung (HCC), Fatigue, GERD (gastroesophageal reflux disease), Hemorrhoids, History of colon polyps, Hyperlipidemia, Hypertension, Iron deficiency anemia, On home oxygen therapy, Oxygen deficiency, Palpitations, Pulmonary nodules, Seasonal allergies, Wears dentures, and Wears glasses.   reports that she quit smoking about 7 years ago. Her smoking use  included cigarettes. She has a 12.50 pack-year smoking history. She has never used smokeless tobacco.  Past Surgical History:  Procedure Laterality Date  . CHOLECYSTECTOMY     laparosopic attempted, then had to open  . COLONOSCOPY     x2 with polyps removed  . COLONOSCOPY N/A 08/27/2015   Procedure: COLONOSCOPY;  Surgeon: Manus Gunning, MD;  Location: [facility];  Service: Gastroenterology;  Laterality: N/A;  . COLONOSCOPY WITH PROPOFOL N/A 01/17/2019   Procedure: COLONOSCOPY WITH PROPOFOL;  Surgeon: Yetta Flock, MD;  Location: [facility];  Service: Gastroenterology;  Laterality: N/A;  . oopherectomy     1 ovary removed only  . POLYPECTOMY    . POLYPECTOMY  01/17/2019   Procedure: POLYPECTOMY;  Surgeon: Yetta Flock, MD;  Location: [facility];  Service: Gastroenterology;;  . SALPINGECTOMY     1 fallopian tube removed    Allergies  Allergen Reactions  . Daliresp [Roflumilast] Diarrhea, Nausea And Vomiting and Other (See Comments)    Upset stomach    Immunization History  Administered Date(s) Administered  . Fluad Quad(high Dose 65+) 03/29/2019  . Influenza Split 06/10/2012, 04/03/2014, 04/20/2015  . Influenza, High Dose Seasonal PF 04/09/2018  . Influenza,inj,Quad PF,6+ Mos 04/24/2013, 03/03/2016  . Influenza-Unspecified 05/29/2017  . PFIZER SARS-COV-2 Vaccination 08/25/2019, 09/15/2019  . Pneumococcal Conjugate-13 05/07/2015  . Pneumococcal Polysaccharide-23 06/10/2012  . Zoster 05/10/2014  . Zoster Recombinat (Shingrix) 07/12/2018    Family History  Problem Relation Age of Onset  . Heart attack Mother   . Pancreatic cancer Mother   . Prostate cancer Father   . Hypertension Father   . Throat cancer Father   . Throat cancer Brother   . Prostate cancer Brother   . Colon cancer Sister   . Brain cancer Sister   . Stomach cancer Neg Hx   . Colon polyps Neg Hx   . Rectal cancer Neg Hx   . Esophageal cancer Neg Hx      Current  Outpatient Medications:  .  Acetylcysteine (N-ACETYL-L-CYSTEINE) 600 MG CAPS, Take 600 mg by mouth 2 (two) times a day. , Disp: , Rfl:  .  albuterol (PROVENTIL) (2.5 MG/3ML) 0.083% nebulizer solution, Take 3 mLs (2.5 mg total) by nebulization every 4 (four) hours as needed for wheezing or shortness of breath. Dx code j44.9, Disp: 125 mL, Rfl: 6 .  albuterol (VENTOLIN HFA) 108 (90 Base) MCG/ACT inhaler, Inhale 1-2 puffs into the lungs every 6 (six) hours as needed for wheezing or shortness of breath., Disp: 24 g, Rfl: 3 .  ALPRAZolam (XANAX) 0.5 MG tablet, Take 0.5 mg by mouth at bedtime as needed for anxiety or sleep. , Disp: , Rfl:  .  AMBULATORY NON FORMULARY MEDICATION, VENTILATOR machine at night, Disp: , Rfl:  .  aspirin EC 81 MG tablet, Take 81 mg by mouth daily. , Disp: , Rfl:  .  buPROPion (WELLBUTRIN SR) 150 MG 12 hr tablet, Take 150 mg by mouth daily., Disp: , Rfl:  .  cetirizine (ZYRTEC) 10 MG tablet, Take 10 mg by mouth daily. , Disp: , Rfl:  .  diltiazem (CARDIZEM CD) 180 MG 24 hr capsule, Take 180 mg by mouth daily. , Disp: , Rfl:  .  ferrous sulfate 325 (65 FE) MG tablet, Take 325 mg by mouth daily with breakfast., Disp: , Rfl:  .  fluticasone (FLONASE) 50 MCG/ACT nasal spray, Place 1 spray into both nostrils at bedtime., Disp: 48 g, Rfl: 3 .  Fluticasone Furoate (ARNUITY ELLIPTA) 100 MCG/ACT AEPB, Inhale 1 puff into the lungs daily., Disp: 90 each, Rfl: 3 .  hydrochlorothiazide (HYDRODIURIL) 25 MG tablet, Take 25 mg by mouth daily. , Disp: , Rfl:  .  hydrocortisone 1 % ointment, Pea size amount into rectum twice daily (Patient taking differently: Apply 1 application topically 2 (two) times daily as needed (hemorrhoids). Pea size amount into rectum twice daily  as needed for hemorrhoids), Disp: 30 g, Rfl: 0 .  metoprolol succinate (TOPROL-XL) 50 MG 24 hr tablet, Take 50 mg by mouth every morning. Take with or immediately following a meal., Disp: , Rfl:  .  mirtazapine (REMERON) 30 MG  tablet, Take 1 tablet (30 mg total) by mouth at bedtime., Disp: 90 tablet, Rfl: 1 .  omeprazole (PRILOSEC) 20 MG capsule, Take 20 mg by mouth daily. , Disp: , Rfl:  .  OXYGEN, Inhale 2-3 L into the lungs daily., Disp: , Rfl:  .  pravastatin (PRAVACHOL) 80 MG tablet, Take 80 mg by mouth every morning. , Disp: , Rfl:  .  spironolactone (ALDACTONE) 50 MG tablet, Take 25 mg by mouth daily. , Disp: , Rfl:  .  temazepam (RESTORIL) 30 MG capsule, Take 30 mg by mouth at bedtime as needed for sleep. , Disp: , Rfl: 4 .  Tiotropium Bromide-Olodaterol (STIOLTO RESPIMAT) 2.5-2.5 MCG/ACT AERS, Inhale 2 puffs into the lungs daily., Disp: 12 g, Rfl: 3 .  VITAMIN D PO, Take 5,000 Units by mouth daily. , Disp: , Rfl:       Objective:   Vitals:   10/11/19 0928  BP: 122/68  Pulse: 91  SpO2: 98%  Weight: 168 lb (76.2 kg)  Height: 5\' 6"  (1.676 m)    Estimated body mass index is 27.12 kg/m as calculated from the following:   Height as of this encounter: 5\' 6"  (1.676 m).   Weight as of this encounter: 168 lb (76.2 kg).  @WEIGHTCHANGE @  [employer]   10/11/19 0928  Weight: 168 lb (76.2 kg)     Physical Exam Pleasant female alert and oriented x3.  Barrel chested overall air entry diminished.  No wheeze no accessory muscle use other than mild pursed lip breathing.  Respiratory exam is baseline cardiovascular normal heart sounds.  No cyanosis no clubbing no edema abdomen with visceral obesity and soft nontender.  Oral cavity exam showed thrush on the soft palate.  And also the tonsillar pillars.      Assessment:       ICD-10-CM   1. Stage 4 very severe COPD by GOLD classification ([place])  J44.9   2. Chronic respiratory failure with hypoxia (HCC)  J96.11   3. Former smoker  Z87.891   4. Cancer screening  Z12.9   5. Oral thrush  B37.0        Plan:     Patient Instructions  Stage 4 very severe COPD by GOLD classification ([place]) Chronic respiratory failure with hypoxia ([place]) Former smoker  -  stable disease without flare up - continue o2, flonase, arnuity, stiolto and night bipap scheduled  -albuterol as needed   Cancer screening  -CT scan November 2020 without any evidence of lung cancer   Plan -Repeat low-dose CT scan of the chest in 9-12 months from March 2021 which would be January-March 2022  Oral thrush -new finding - # Oral thrush - For Oral thrush: Take Suspension (swish and swallow): 500,000 units 4 times/day for 5 days; swish in the mouth and retain for as long as possible (several minutes) before swallowing. IF nystatin is in back order: alternatives are clotrimazole troche (throt lozenge) 10mg  dissolved and swish and swallow 5 times a day for 14 days.      Followup 4-6 months or sooner if needed; CAT score at followup        SIGNATURE    Dr. Brand Males, M.D., F.C.C.P,  Pulmonary and Critical Care Medicine Staff Physician,  [place] Director - Interstitial Lung Disease  Program  P[facility] [place] [facility] [place], [place], [address]  Pager: [phone], If no answer or between  15:00h - 7:00h: call [phone] Telephone: [phone]  9:53 AM 10/11/2019

## 2019-10-11 NOTE — Addendum Note (Signed)
Addended by: Lorretta Harp on: 10/11/2019 09:57 AM   Modules accepted: Orders

## 2019-10-20 ENCOUNTER — Telehealth: Payer: Self-pay | Admitting: Internal Medicine

## 2019-10-20 NOTE — Telephone Encounter (Signed)
Pt called back-- please return call.  

## 2019-10-20 NOTE — Telephone Encounter (Signed)
Called and spoke with pt about the forms that were dropped off. Pt stated that she is needing a new license plate so that is one of the forms that she has dropped off and the other form is for her to have if she is in the car with someone else so they can be able to have a handicap placard in there while she is with them.  Stated to pt that I would give the forms to MR to sign and we would call her back once all has been taken care of and once forms are placed in the mail. Pt verbalized understanding.  Routing to myself to follow up on.

## 2019-10-20 NOTE — Telephone Encounter (Signed)
Pt had previously dropped off papers for handicap placard that were returned to her when she came for OV 3/10 with MR. Attempted to call pt to see if there was anything wrong with those forms or if she was needing to have them filled out again for another reason but unable to reach. Left message for pt to return call.  I did state to pt in the message that I would give the ones she just dropped off to MR when next in office.

## 2019-10-26 NOTE — Telephone Encounter (Signed)
Form has been signed and placed in outgoing mail. Called and informed pt of this. Nothing further needed at this time.

## 2019-10-30 DIAGNOSIS — J449 Chronic obstructive pulmonary disease, unspecified: Secondary | ICD-10-CM | POA: Diagnosis not present

## 2019-10-30 DIAGNOSIS — J9611 Chronic respiratory failure with hypoxia: Secondary | ICD-10-CM | POA: Diagnosis not present

## 2019-11-16 ENCOUNTER — Other Ambulatory Visit: Payer: Self-pay | Admitting: Internal Medicine

## 2019-11-30 DIAGNOSIS — J9611 Chronic respiratory failure with hypoxia: Secondary | ICD-10-CM | POA: Diagnosis not present

## 2019-11-30 DIAGNOSIS — J449 Chronic obstructive pulmonary disease, unspecified: Secondary | ICD-10-CM | POA: Diagnosis not present

## 2019-12-18 DIAGNOSIS — R630 Anorexia: Secondary | ICD-10-CM | POA: Diagnosis not present

## 2019-12-18 DIAGNOSIS — J449 Chronic obstructive pulmonary disease, unspecified: Secondary | ICD-10-CM | POA: Diagnosis not present

## 2019-12-18 DIAGNOSIS — Z1331 Encounter for screening for depression: Secondary | ICD-10-CM | POA: Diagnosis not present

## 2019-12-18 DIAGNOSIS — Z9981 Dependence on supplemental oxygen: Secondary | ICD-10-CM | POA: Diagnosis not present

## 2019-12-18 DIAGNOSIS — I10 Essential (primary) hypertension: Secondary | ICD-10-CM | POA: Diagnosis not present

## 2019-12-18 DIAGNOSIS — F329 Major depressive disorder, single episode, unspecified: Secondary | ICD-10-CM | POA: Diagnosis not present

## 2019-12-18 DIAGNOSIS — M25562 Pain in left knee: Secondary | ICD-10-CM | POA: Diagnosis not present

## 2019-12-18 DIAGNOSIS — I2781 Cor pulmonale (chronic): Secondary | ICD-10-CM | POA: Diagnosis not present

## 2019-12-30 DIAGNOSIS — J9611 Chronic respiratory failure with hypoxia: Secondary | ICD-10-CM | POA: Diagnosis not present

## 2019-12-30 DIAGNOSIS — J449 Chronic obstructive pulmonary disease, unspecified: Secondary | ICD-10-CM | POA: Diagnosis not present

## 2020-01-08 ENCOUNTER — Other Ambulatory Visit: Payer: Self-pay | Admitting: Internal Medicine

## 2020-01-15 DIAGNOSIS — J449 Chronic obstructive pulmonary disease, unspecified: Secondary | ICD-10-CM | POA: Diagnosis not present

## 2020-01-19 DIAGNOSIS — J449 Chronic obstructive pulmonary disease, unspecified: Secondary | ICD-10-CM | POA: Diagnosis not present

## 2020-01-19 DIAGNOSIS — I2781 Cor pulmonale (chronic): Secondary | ICD-10-CM | POA: Diagnosis not present

## 2020-01-19 DIAGNOSIS — M25562 Pain in left knee: Secondary | ICD-10-CM | POA: Diagnosis not present

## 2020-01-29 ENCOUNTER — Other Ambulatory Visit: Payer: Self-pay

## 2020-01-29 ENCOUNTER — Ambulatory Visit: Payer: Medicare HMO | Admitting: Internal Medicine

## 2020-01-29 VITALS — BP 108/66 | HR 89 | Temp 98.4°F | Ht 66.0 in | Wt 163.0 lb

## 2020-01-29 DIAGNOSIS — Z87891 Personal history of nicotine dependence: Secondary | ICD-10-CM | POA: Diagnosis not present

## 2020-01-29 DIAGNOSIS — J9611 Chronic respiratory failure with hypoxia: Secondary | ICD-10-CM

## 2020-01-29 DIAGNOSIS — B370 Candidal stomatitis: Secondary | ICD-10-CM | POA: Diagnosis not present

## 2020-01-29 DIAGNOSIS — J449 Chronic obstructive pulmonary disease, unspecified: Secondary | ICD-10-CM | POA: Diagnosis not present

## 2020-01-29 DIAGNOSIS — R634 Abnormal weight loss: Secondary | ICD-10-CM

## 2020-01-29 DIAGNOSIS — Z129 Encounter for screening for malignant neoplasm, site unspecified: Secondary | ICD-10-CM | POA: Diagnosis not present

## 2020-01-29 MED ORDER — ALBUTEROL SULFATE (2.5 MG/3ML) 0.083% IN NEBU: 2.5000 mg | INHALATION_SOLUTION | RESPIRATORY_TRACT | 6 refills | Status: DC | PRN

## 2020-01-29 MED ORDER — FLUCONAZOLE 100 MG PO TABS
100.0000 mg | ORAL_TABLET | Freq: Every day | ORAL | 0 refills | Status: DC
Start: 2020-01-29 — End: 2020-10-23

## 2020-01-29 MED ORDER — PREDNISONE 10 MG PO TABS: ORAL_TABLET | ORAL | 0 refills | Status: DC

## 2020-01-29 MED ORDER — DOXYCYCLINE HYCLATE 100 MG PO TABS: 100.0000 mg | ORAL_TABLET | Freq: Two times a day (BID) | ORAL | 0 refills | Status: DC

## 2020-01-29 NOTE — Progress Notes (Signed)
07/12/2018 -   Chief Complaint  Patient presents with   Follow-up    pt states breathing is baseline. c/o occ prod cough with yellowish to white mucus.     HPI Katie Greene 64 y.o. 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  She continues on nocturnal BiPAP.  This is really improved her symptom score.  COPD CAT score is now 14.  She is on triple inhaler therapy and oxygen as well.  #2019 CT scan of the chest did not show any evidence of lung cancer.  Annual screening is recommended.  Her new issues this visit are that she has unintentional weight loss of 12 pounds.  She is frustrated by this.  She has seen GI and is seeing a primary care physician for this.  She is also asking for travel advice encounter.  She is going to [place] for 4 days for the weekend with her friends to celebrate her recent divorce.  She is asking for preemptive antibiotic and prednisone    OV 04/18/2019  Subjective:  Patient ID: Katie Greene, female , DOB: 01/08/1956 , age 68 y.o. , MRN: [id number] , ADDRESS: [address] [place] [address]   04/18/2019 -   Chief Complaint  Patient presents with   Stage 4 very severe COPD by GOLD classification    Breathing is the same as at last visit in December 2019     HPI Katie Greene 64 y.o. -follows up for end-stage Gold stage IV COPD.  Last seen early part of 2020.  After the onset of the pandemic she has hunker down and socially distanced.  She has limited her activities to very low risk.  She goes out to the grocery store with a mask.  She has avoided people.  She continues to be on Arnuity and Stiolto and nighttime BiPAP with Flonase and daily schedule oxygen no new problems.  She just wants refills of doxycycline and prednisone in case of a future exacerbation.  But otherwise doing well.  CAT score shows stability.         OV 06/14/2019  Subjective:  Patient ID: Katie Greene, female , DOB:  1956-05-17 , age 46 y.o. , MRN: [id number] , ADDRESS: [address] [place] [address]   06/14/2019 -   Chief Complaint  Patient presents with   Follow-up    Pt states she has been doing well since last visit and denies any complaints.   Follow-up stage IV COPD with chronic hypoxemic respiratory failure  HPI Katie Greene 64 y.o. -presents for follow-up.  Last saw her 2 months ago.  She was supposed to come between 3 and 6 months.  However due to some scheduling issues she is back in 2 months.  She has no new symptoms.  COPD CAT score is listed below and stable.  She is on oxygen daytime, Flonase, Arnuity, Stiolto and night BiPAP.  She is doing well.  She had a low-dose CT scan of the chest and findings of benign and a repeat CT scan in 1 year has been recommended.    IMPRESSION: 1. Lung-RADS 2, benign appearance or behavior. Continue annual screening with low-dose chest CT without contrast in 12 months. 2. Aortic Atherosclerosis (ICD10-I70.0) and Emphysema (ICD10-J43.9).   Electronically Signed   By: Misty Stanley M.D.   On: 06/05/2019 11:48 ROS - per HPI   OV 10/11/2019  Subjective:  Patient  ID: Katie Greene, female , DOB: [birth year] , age 22 y.o. , MRN: [id number] , ADDRESS: [address] [place] [address]   10/11/2019 -   Chief Complaint  Patient presents with   Follow-up    Pt states she has been doing good since last visit and denies any current complaints.     HPI Katie Greene 64 y.o. -presents for COPD follow-up.  Last seen January 2020.  She continues on triple inhaler therapy oxygen at nighttime BiPAP.  Overall stable.  She has had a Covid vaccine.  She is isolating well in social distancing and masking.  Cancer screening: Last CT scan of the chest low-dose CT chest Nov 2020 without any evidence of cancer.  Follow-up recommended in 1 year.  Her advanced COPD makes it difficult to have any successful intervention.  She is agreed to  have her CT chest in 15 months - 18 months which would be in early 2022.  I reviewed and interpreted the result myself.  New issue: On exam she had oral thrush.  This is new.  Last nystatin intake was over a year ago according to her history.  IMPRESSION: 1. Lung-RADS 2, benign appearance or behavior. Continue annual screening with low-dose chest CT without contrast in 12 months. 2. Aortic Atherosclerosis (ICD10-I70.0) and Emphysema (ICD10-J43.9).   Electronically Signed   By: Misty Stanley M.D.   On: 06/05/2019 11:48 ROS - per HPI   OV 01/29/2020  Subjective:  Patient ID: Katie Greene, female , DOB: 1955/10/03 , age 64 y.o. , MRN: [id number] , ADDRESS: [address] [place] [address]   01/29/2020 -   Chief Complaint  Patient presents with   Follow-up    cough with "little bit of light yellow sputum"     HPI Katie Greene 64 y.o. -returns for follow-up of for her advanced COPD.  She remains on oxygen.  She is on inhaler therapy and nighttime BiPAP.  She tells me that [facility] pharmacy is shut down and run out of business.  She wants another pharmacy to pick up her albuterol nebulizer.  Other than that in 11/26/2019 her stepfather died at age 79s because of renal failure.  She is in grief reaction.  Sometime before that she started losing weight.  Currently weight is 163 pounds.  It is unintentional.  However she thinks it is because of grief reaction.  In addition for the last 1 week she has a little bit of light yellow sputum there is a change in color.  There is no increased wheezing or no increased cough.  Nevertheless she think she will benefit from antibiotics.  She wants to keep prednisone handy.  Last visit we treated her for oral thrush with nystatin swish and swallow.  This visit she still has oral thrush.  She says she rinses her mouth pretty diligently.   CAT Score 01/29/2020 10/11/2019 01/02/2019  Total CAT Score 18 25 13       CAT COPD Symptom  & Quality of Life Score ([place]) 0 is no burden. 5 is highest burden 12/31/2016  03/09/2017  10/11/2017  01/26/2018  04/28/2018 With nigight bipap 07/12/2018 onging bipap qhs 04/18/2019  06/14/2019   Never Cough -> Cough all the time 3 2 4 3 3 2 2 2   No phlegm in chest -> Chest is full of phlegm 3 2 3 4 2 2 1 2   No chest tightness -> Chest feels very tight  3 3 3 3  0 0 1 1  No dyspnea for 1 flight stairs/hill -> Very dyspneic for 1 flight of stairs 5 5 5 5 4 4 4 4   No limitations for ADL at home -> Very limited with ADL at home 5 3 5 5 5 2 2 2   Confident leaving home -> Not at all confident leaving home 2 5 3 4 3  0 1 2  Sleep soundly -> Do not sleep soundly because of lung condition 2 4 3 4 3 4 3 2   Lots of Energy -> No energy at all 4 3 3 3 4  0 3 3  TOTAL Score (max 40)  27 27 30 31 24 14 17 18     ROS - per HPI     has a past medical history of Allergy, Anxiety, Arthritis, Chest pain, Chronic respiratory failure (HCC), COPD (chronic obstructive pulmonary disease) ([place]), Depression, Emphysema of lung (HCC), Fatigue, GERD (gastroesophageal reflux disease), Hemorrhoids, History of colon polyps, Hyperlipidemia, Hypertension, Iron deficiency anemia, On home oxygen therapy, Oxygen deficiency, Palpitations, Pulmonary nodules, Seasonal allergies, Wears dentures, and Wears glasses.   reports that she quit smoking about 7 years ago. Her smoking use included cigarettes. She has a 12.50 pack-year smoking history. She has never used smokeless tobacco.  Past Surgical History:  Procedure Laterality Date   CHOLECYSTECTOMY     laparosopic attempted, then had to open   COLONOSCOPY     x2 with polyps removed   COLONOSCOPY N/A 08/27/2015   Procedure: COLONOSCOPY;  Surgeon: Manus Gunning, MD;  Location: [facility];  Service: Gastroenterology;  Laterality: N/A;   COLONOSCOPY WITH PROPOFOL N/A 01/17/2019   Procedure: COLONOSCOPY WITH PROPOFOL;  Surgeon: Yetta Flock, MD;   Location: [facility];  Service: Gastroenterology;  Laterality: N/A;   oopherectomy     1 ovary removed only   POLYPECTOMY     POLYPECTOMY  01/17/2019   Procedure: POLYPECTOMY;  Surgeon: Yetta Flock, MD;  Location: [facility];  Service: Gastroenterology;;   SALPINGECTOMY     1 fallopian tube removed    Allergies  Allergen Reactions   Daliresp [Roflumilast] Diarrhea, Nausea And Vomiting and Other (See Comments)    Upset stomach    Immunization History  Administered Date(s) Administered   Fluad Quad(high Dose 65+) 03/29/2019   Influenza Split 06/10/2012, 04/03/2014, 04/20/2015   Influenza, High Dose Seasonal PF 04/09/2018   Influenza,inj,Quad PF,6+ Mos 04/24/2013, 03/03/2016   Influenza-Unspecified 05/29/2017   PFIZER SARS-COV-2 Vaccination 08/25/2019, 09/15/2019   Pneumococcal Conjugate-13 05/07/2015   Pneumococcal Polysaccharide-23 06/10/2012   Zoster 05/10/2014   Zoster Recombinat (Shingrix) 07/12/2018    Family History  Problem Relation Age of Onset   Heart attack Mother    Pancreatic cancer Mother    Prostate cancer Father    Hypertension Father    Throat cancer Father    Throat cancer Brother    Prostate cancer Brother    Colon cancer Sister    Brain cancer Sister    Stomach cancer Neg Hx    Colon polyps Neg Hx    Rectal cancer Neg Hx    Esophageal cancer Neg Hx      Current Outpatient Medications:    Acetylcysteine (N-ACETYL-L-CYSTEINE) 600 MG CAPS, Take 600 mg by mouth 2 (two) times a day. , Disp: , Rfl:    albuterol (PROVENTIL) (2.5 MG/3ML) 0.083% nebulizer solution, Take 3 mLs (2.5 mg total) by nebulization every 4 (four) hours as needed for wheezing or shortness of breath.  Dx code j68.9, Disp: 125 mL, Rfl: 6   albuterol (VENTOLIN HFA) 108 (90 Base) MCG/ACT inhaler, INHALE 1 TO 2 PUFFS EVERY 6 HOURS AS NEEDED, Disp: 54 g, Rfl: 3   ALPRAZolam (XANAX) 0.5 MG tablet, Take 0.5 mg by mouth at bedtime as needed for  anxiety or sleep. , Disp: , Rfl:    AMBULATORY NON FORMULARY MEDICATION, VENTILATOR machine at night, Disp: , Rfl:    ARNUITY ELLIPTA 100 MCG/ACT AEPB, INHALE 1 PUFF EVERY DAY, Disp: 90 each, Rfl: 3   aspirin EC 81 MG tablet, Take 81 mg by mouth daily. , Disp: , Rfl:    buPROPion (WELLBUTRIN SR) 150 MG 12 hr tablet, Take 150 mg by mouth daily., Disp: , Rfl:    cetirizine (ZYRTEC) 10 MG tablet, Take 10 mg by mouth daily. , Disp: , Rfl:    diltiazem (CARDIZEM CD) 180 MG 24 hr capsule, Take 180 mg by mouth daily. , Disp: , Rfl:    ferrous sulfate 325 (65 FE) MG tablet, Take 325 mg by mouth daily with breakfast., Disp: , Rfl:    fluticasone (FLONASE) 50 MCG/ACT nasal spray, Place 1 spray into both nostrils at bedtime., Disp: 48 g, Rfl: 3   hydrochlorothiazide (HYDRODIURIL) 25 MG tablet, Take 25 mg by mouth daily. , Disp: , Rfl:    metoprolol succinate (TOPROL-XL) 50 MG 24 hr tablet, Take 50 mg by mouth every morning. Take with or immediately following a meal., Disp: , Rfl:    mirtazapine (REMERON) 30 MG tablet, Take 1 tablet (30 mg total) by mouth at bedtime., Disp: 90 tablet, Rfl: 1   omeprazole (PRILOSEC) 20 MG capsule, Take 20 mg by mouth daily. , Disp: , Rfl:    OXYGEN, Inhale 2-3 L into the lungs daily., Disp: , Rfl:    pravastatin (PRAVACHOL) 80 MG tablet, Take 80 mg by mouth every morning. , Disp: , Rfl:    spironolactone (ALDACTONE) 50 MG tablet, Take 25 mg by mouth daily. , Disp: , Rfl:    STIOLTO RESPIMAT 2.5-2.5 MCG/ACT AERS, INHALE 2 PUFFS EVERY DAY, Disp: 12 g, Rfl: 3   temazepam (RESTORIL) 30 MG capsule, Take 30 mg by mouth at bedtime as needed for sleep. , Disp: , Rfl: 4   VITAMIN D PO, Take 5,000 Units by mouth daily. , Disp: , Rfl:    doxycycline (VIBRA-TABS) 100 MG tablet, Take 1 tablet (100 mg total) by mouth 2 (two) times daily., Disp: 10 tablet, Rfl: 0   fluconazole (DIFLUCAN) 100 MG tablet, Take 1 tablet (100 mg total) by mouth daily., Disp: 7 tablet, Rfl:  0   hydrocortisone 1 % ointment, Pea size amount into rectum twice daily (Patient not taking: Reported on 01/29/2020), Disp: 30 g, Rfl: 0   nystatin (MYCOSTATIN) 100000 UNIT/ML suspension, Take 5 mLs (500,000 Units total) by mouth 4 (four) times daily. (Patient not taking: Reported on 01/29/2020), Disp: 120 mL, Rfl: 1   predniSONE (DELTASONE) 10 MG tablet, Take 4x1day, 3x1day, 2x1day, 1x1day, 0.5x1day then stop, Disp: 11 tablet, Rfl: 0      Objective:   Vitals:   01/29/20 0938  BP: 108/66  Pulse: 89  Temp: 98.4 F (36.9 C)  TempSrc: Oral  SpO2: 99%  Weight: 163 lb (73.9 kg)  Height: 5\' 6"  (1.676 m)    Estimated body mass index is 26.31 kg/m as calculated from the following:   Height as of this encounter: 5\' 6"  (1.676 m).   Weight as of this encounter: 163 lb (  73.9 kg).  @WEIGHTCHANGE @  [employer]   01/29/20 0938  Weight: 163 lb (73.9 kg)     Physical Exam Pleasant female on oxygen with barrel chest.  She appears to have lost some weight.  No wheezing she still has some oral thrush.  It is diminished compared to the past.  No wheezing.  Overall air entry is distant normal heart sounds.  No cyanosis no clubbing no edema.  No elevated neck nodes no JVP.         Assessment:       ICD-10-CM   1. Stage 4 very severe COPD by GOLD classification ([place])  J44.9   2. Chronic respiratory failure with hypoxia (HCC)  J96.11   3. Former smoker  Z87.891   4. Cancer screening  Z12.9   5. Oral thrush  B37.0   6. Weight loss, unintentional  R63.4        Plan:     Patient Instructions  Stage 4 very severe COPD by GOLD classification ([place]) Chronic respiratory failure with hypoxia (HCC) Former smoker  - mild flare up +  Plan - continue o2, flonase, arnuity, stiolto and night bipap scheduled  - Take doxycycline 100mg  po twice daily x 5 days; take after meals and avoid sunlight - and in case things get worse do - Please take prednisone 40 mg x1 day, then 30 mg x1 day, then  20 mg x1 day, then 10 mg x1 day, and then 5 mg x1 day and stop -albuterol as needed  Weight loss - ? Due to grief reaction in April 2021  - weight 01/29/2020 - 163#  Pl;an  - monitor  Cancer screening  -CT scan November 2020 without any evidence of lung cancer   Plan -Repeat low-dose CT scan of the chest in 9-12 months from March 2021 which would be January-March 2022  Oral thrush -new finding last visit and now persistent - # Oral thrush - fluconazole 100 mg daily for 7days       Followup 3 months or sooner if needed       SIGNATURE    Dr. Brand Males, M.D., F.C.C.P,  Pulmonary and Critical Care Medicine Staff Physician, [place] Director - Interstitial Lung Disease  Program  Pulmonary [place] at [place], [place], [address]  Pager: [phone], If no answer or between  15:00h - 7:00h: call [phone] Telephone: [phone]  10:20 AM 01/29/2020

## 2020-01-29 NOTE — Patient Instructions (Addendum)
Stage 4 very severe COPD by GOLD classification ([place]) Chronic respiratory failure with hypoxia (HCC) Former smoker  - mild flare up +  Plan - continue o2, flonase, arnuity, stiolto and night bipap scheduled  - Take doxycycline 100mg  po twice daily x 5 days; take after meals and avoid sunlight - and in case things get worse do - Please take prednisone 40 mg x1 day, then 30 mg x1 day, then 20 mg x1 day, then 10 mg x1 day, and then 5 mg x1 day and stop -albuterol as needed  Weight loss - ? Due to grief reaction in April 2021  - weight 01/29/2020 - 163#  Pl;an  - monitor  Cancer screening  -CT scan November 2020 without any evidence of lung cancer   Plan -Repeat low-dose CT scan of the chest in 9-12 months from March 2021 which would be January-March 2022  Oral thrush -new finding last visit and now persistent - # Oral thrush - fluconazole 100 mg daily for 7days       Followup 3 months or sooner if needed

## 2020-01-30 DIAGNOSIS — J449 Chronic obstructive pulmonary disease, unspecified: Secondary | ICD-10-CM | POA: Diagnosis not present

## 2020-01-30 DIAGNOSIS — J9611 Chronic respiratory failure with hypoxia: Secondary | ICD-10-CM | POA: Diagnosis not present

## 2020-02-14 DIAGNOSIS — J449 Chronic obstructive pulmonary disease, unspecified: Secondary | ICD-10-CM | POA: Diagnosis not present

## 2020-02-17 ENCOUNTER — Telehealth: Payer: Self-pay | Admitting: Internal Medicine

## 2020-02-17 NOTE — Telephone Encounter (Signed)
Got unregistered snail mail letter (? Dated in June )  saying trilogy needs bacterial filter and she called [facility] who she says asked her to call the doctor on call before using the machine again  rec call [employer] 7/19 to sort out what she needs for her trilogy ventilator.  Having no problems with it so far so ok to use it while we sort out what exactly to order for machine

## 2020-02-19 ENCOUNTER — Telehealth: Payer: Self-pay | Admitting: Internal Medicine

## 2020-02-19 NOTE — Telephone Encounter (Signed)
Spoke with Kenney Houseman at [facility]  She states that her manager put in order for bacterial filter already and pt should be receiving this soon  Nothing further needed

## 2020-02-19 NOTE — Telephone Encounter (Signed)
Spoke with pt, states that her Trilogy 100 vent is under recall from [employer].  Pt was contacted by Huey Romans and advised that a bacterial filter was going to be sent to her to put on her vent, and was advised by [facility] not to wear the vent in the meantime until she receives that filter.  Pt states that she does not feel comfortable doing this, as she wears this all night and for several hours throughout the day.    MR please advise on recs.  Thanks.

## 2020-02-19 NOTE — Telephone Encounter (Signed)
I think the recall was because of a carcinogen.  Given the fact she is already been using it for so long and she is already been exposed to this carcinogen I agree with stopping it for a few days right now is going to be more detrimental to her health because of her COPD.  Therefore she can continue using it as long as she understands the risks

## 2020-02-20 NOTE — Telephone Encounter (Signed)
Called and spoke with pt letting her know the info stated by MR and she verbalized understanding. Nothing further needed. 

## 2020-02-27 ENCOUNTER — Telehealth: Payer: Self-pay | Admitting: Internal Medicine

## 2020-02-27 NOTE — Telephone Encounter (Signed)
Katie Greene please advise on this message   pt calling to let Katie Greene know: Katie Greene said they faxed a letter to [facility] on 7/7 but have not yet heard back. Please call [facility] to get letter of necessity for pt's o2 and o2 equipment or fax letter in to [phone]

## 2020-02-28 NOTE — Telephone Encounter (Signed)
I don't have anything for this patient to be signed

## 2020-02-29 DIAGNOSIS — J449 Chronic obstructive pulmonary disease, unspecified: Secondary | ICD-10-CM | POA: Diagnosis not present

## 2020-02-29 DIAGNOSIS — J9611 Chronic respiratory failure with hypoxia: Secondary | ICD-10-CM | POA: Diagnosis not present

## 2020-03-16 DIAGNOSIS — J449 Chronic obstructive pulmonary disease, unspecified: Secondary | ICD-10-CM | POA: Diagnosis not present

## 2020-03-25 DIAGNOSIS — Z1231 Encounter for screening mammogram for malignant neoplasm of breast: Secondary | ICD-10-CM | POA: Diagnosis not present

## 2020-03-31 DIAGNOSIS — J449 Chronic obstructive pulmonary disease, unspecified: Secondary | ICD-10-CM | POA: Diagnosis not present

## 2020-03-31 DIAGNOSIS — J9611 Chronic respiratory failure with hypoxia: Secondary | ICD-10-CM | POA: Diagnosis not present

## 2020-04-16 DIAGNOSIS — J449 Chronic obstructive pulmonary disease, unspecified: Secondary | ICD-10-CM | POA: Diagnosis not present

## 2020-04-18 DIAGNOSIS — M25512 Pain in left shoulder: Secondary | ICD-10-CM | POA: Diagnosis not present

## 2020-04-30 ENCOUNTER — Telehealth: Payer: Self-pay | Admitting: Internal Medicine

## 2020-04-30 ENCOUNTER — Encounter: Payer: Self-pay | Admitting: Internal Medicine

## 2020-04-30 ENCOUNTER — Ambulatory Visit (INDEPENDENT_AMBULATORY_CARE_PROVIDER_SITE_OTHER): Payer: Medicare HMO | Admitting: Internal Medicine

## 2020-04-30 ENCOUNTER — Other Ambulatory Visit: Payer: Self-pay

## 2020-04-30 VITALS — BP 106/70 | HR 94 | Temp 97.3°F | Ht 66.0 in | Wt 164.4 lb

## 2020-04-30 DIAGNOSIS — Z7189 Other specified counseling: Secondary | ICD-10-CM | POA: Diagnosis not present

## 2020-04-30 DIAGNOSIS — Z129 Encounter for screening for malignant neoplasm, site unspecified: Secondary | ICD-10-CM | POA: Diagnosis not present

## 2020-04-30 DIAGNOSIS — B370 Candidal stomatitis: Secondary | ICD-10-CM

## 2020-04-30 DIAGNOSIS — Z7185 Encounter for immunization safety counseling: Secondary | ICD-10-CM

## 2020-04-30 DIAGNOSIS — J9611 Chronic respiratory failure with hypoxia: Secondary | ICD-10-CM

## 2020-04-30 DIAGNOSIS — J449 Chronic obstructive pulmonary disease, unspecified: Secondary | ICD-10-CM | POA: Diagnosis not present

## 2020-04-30 DIAGNOSIS — R634 Abnormal weight loss: Secondary | ICD-10-CM

## 2020-04-30 NOTE — Progress Notes (Addendum)
07/12/2018 -   Chief Complaint  Patient presents with  . Follow-up    pt states breathing is baseline. c/o occ prod cough with yellowish to white mucus.     HPI Katie Greene 64 y.o. 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  She continues on nocturnal BiPAP.  This is really improved her symptom score.  COPD CAT score is now 14.  She is on triple inhaler therapy and oxygen as well.  #2019 CT scan of the chest did not show any evidence of lung cancer.  Annual screening is recommended.  Her new issues this visit are that she has unintentional weight loss of 12 pounds.  She is frustrated by this.  She has seen GI and is seeing a primary care physician for this.  She is also asking for travel advice encounter.  She is going to [place] for 4 days for the weekend with her friends to celebrate her recent divorce.  She is asking for preemptive antibiotic and prednisone    OV 04/18/2019  Subjective:  Patient ID: Katie Greene, female , DOB: Aug 18, 1955 , age 2 y.o. , MRN: [id number] , ADDRESS: [address] [place] [address]   04/18/2019 -   Chief Complaint  Patient presents with  . Stage 4 very severe COPD by GOLD classification    Breathing is the same as at last visit in December 2019     HPI Katie Greene 64 y.o. -follows up for end-stage Gold stage IV COPD.  Last seen early part of 2020.  After the onset of the pandemic she has hunker down and socially distanced.  She has limited her activities to very low risk.  She goes out to the grocery store with a mask.  She has avoided people.  She continues to be on Arnuity and Stiolto and nighttime BiPAP with Flonase and daily schedule oxygen no new problems.  She just wants refills of doxycycline and prednisone in case of a future exacerbation.  But otherwise doing well.  CAT score shows stability.         OV 06/14/2019  Subjective:  Patient ID: Katie Greene, female , DOB:  Apr 28, 1956 , age 72 y.o. , MRN: [id number] , ADDRESS: [address] [place] [address]   06/14/2019 -   Chief Complaint  Patient presents with  . Follow-up    Pt states she has been doing well since last visit and denies any complaints.   Follow-up stage IV COPD with chronic hypoxemic respiratory failure  HPI Katie Greene 64 y.o. -presents for follow-up.  Last saw her 2 months ago.  She was supposed to come between 3 and 6 months.  However due to some scheduling issues she is back in 2 months.  She has no new symptoms.  COPD CAT score is listed below and stable.  She is on oxygen daytime, Flonase, Arnuity, Stiolto and night BiPAP.  She is doing well.  She had a low-dose CT scan of the chest and findings of benign and a repeat CT scan in 1 year has been recommended.    IMPRESSION: 1. Lung-RADS 2, benign appearance or behavior. Continue annual screening with low-dose chest CT without contrast in 12 months. 2. Aortic Atherosclerosis (ICD10-I70.0) and Emphysema (ICD10-J43.9).   Electronically Signed   By: Misty Stanley M.D.   On: 06/05/2019 11:48 ROS - per HPI   OV 10/11/2019  Subjective:  Patient  ID: Katie Greene, female , DOB: 1955-11-22 , age 25 y.o. , MRN: [id number] , ADDRESS: [address] [place] [address]   10/11/2019 -   Chief Complaint  Patient presents with  . Follow-up    Pt states she has been doing good since last visit and denies any current complaints.     HPI Katie Greene 64 y.o. -presents for COPD follow-up.  Last seen January 2020.  She continues on triple inhaler therapy oxygen at nighttime BiPAP.  Overall stable.  She has had a Covid vaccine.  She is isolating well in social distancing and masking.  Cancer screening: Last CT scan of the chest low-dose CT chest Nov 2020 without any evidence of cancer.  Follow-up recommended in 1 year.  Her advanced COPD makes it difficult to have any successful intervention.  She is agreed to  have her CT chest in 15 months - 18 months which would be in early 2022.  I reviewed and interpreted the result myself.  New issue: On exam she had oral thrush.  This is new.  Last nystatin intake was over a year ago according to her history.  IMPRESSION: 1. Lung-RADS 2, benign appearance or behavior. Continue annual screening with low-dose chest CT without contrast in 12 months. 2. Aortic Atherosclerosis (ICD10-I70.0) and Emphysema (ICD10-J43.9).   Electronically Signed   By: Misty Stanley M.D.   On: 06/05/2019 11:48 ROS - per HPI   OV 01/29/2020  Subjective:  Patient ID: Katie Greene, female , DOB: 1956/05/17 , age 69 y.o. , MRN: [id number] , ADDRESS: [address] [place] [address]   01/29/2020 -   Chief Complaint  Patient presents with  . Follow-up    cough with "little bit of light yellow sputum"     HPI Katie Greene 64 y.o. -returns for follow-up of for her advanced COPD.  She remains on oxygen.  She is on inhaler therapy and nighttime BiPAP.  She tells me that [facility] family pharmacy is shut down and run out of business.  She wants another pharmacy to pick up her albuterol nebulizer.  Other than that in Dec 05, 2019 her stepfather died at age 54s because of renal failure.  She is in grief reaction.  Sometime before that she started losing weight.  Currently weight is 163 pounds.  It is unintentional.  However she thinks it is because of grief reaction.  In addition for the last 1 week she has a little bit of light yellow sputum there is a change in color.  There is no increased wheezing or no increased cough.  Nevertheless she think she will benefit from antibiotics.  She wants to keep prednisone handy.  Last visit we treated her for oral thrush with nystatin swish and swallow.  This visit she still has oral thrush.  She says she rinses her mouth pretty diligently.     OV 04/30/2020   Subjective:  Patient ID: Katie Greene, female , DOB: [birth year], age  75 y.o. years. , MRN: [id number],  ADDRESS: [address] [place] [address] PCP  Leanna Battles, MD Providers : Treatment Team:  Attending Provider: Brand Males, MD   Chief Complaint  Patient presents with  . 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 -returns for routine follow-up.  She is on oxygen 3 L nasal cannula at nighttime BiPAP, Flonase, Arnuity and  Stiolto.  Overall she is feeling well.  Last visit she had persistent thrush.  She took Diflucan.  She thinks she has persistent thrush this time as well but upon close examination there is no more thrush.  Ritta Slot is resolved.  She was having weight loss following death of her sister but this seems to have stabilized weight is 164 pounds and stable.  Her last scans for lung cancer screening was November 2020.  She is coming up 1 year.  She is not having any hemoptysis.  Overall she feels stable.  She is in need of a flu shot and Covid booster vaccine.  She is willing to have these.  She did do a walking desaturation test for [place] on 2 L.  Resting heart rate was 89/min.  Pulse ox was 87% on RA at rest. Pulse ox 99% at rest on 2L.  She walked 1 lap on 2 L continuous and her pulse ox dropped to 81%.  Heart rate was 99/min. AT home use 3L  CAT Score 01/29/2020 10/11/2019 01/02/2019  Total CAT Score 18 25 13       CAT COPD Symptom & Quality of Life Score ([place]) 0 is no burden. 5 is highest burden 12/31/2016  03/09/2017  10/11/2017  01/26/2018  04/28/2018 With nigight bipap 07/12/2018 onging bipap qhs 04/18/2019  06/14/2019   Never Cough -> Cough all the time 3 2 4 3 3 2 2 2   No phlegm in chest -> Chest is full of phlegm 3 2 3 4 2 2 1 2   No chest tightness -> Chest feels very tight 3 3 3 3  0 0 1 1  No dyspnea for 1 flight stairs/hill -> Very dyspneic for 1 flight of stairs 5 5 5 5 4 4 4 4   No limitations for ADL at home -> Very limited with ADL at home 5 3 5 5 5 2 2 2    Confident leaving home -> Not at all confident leaving home 2 5 3 4 3  0 1 2  Sleep soundly -> Do not sleep soundly because of lung condition 2 4 3 4 3 4 3 2   Lots of Energy -> No energy at all 4 3 3 3 4  0 3 3  TOTAL Score (max 40)  27 27 30 31 24 14 17 18     ROS - per HPI      ROS - per HPI     has a past medical history of Allergy, Anxiety, Arthritis, Chest pain, Chronic respiratory failure ([facility]), COPD (chronic obstructive pulmonary disease) ([place]), Depression, Emphysema of lung ([employer]), Fatigue, GERD (gastroesophageal reflux disease), Hemorrhoids, History of colon polyps, Hyperlipidemia, Hypertension, Iron deficiency anemia, On home oxygen therapy, Oxygen deficiency, Palpitations, Pulmonary nodules, Seasonal allergies, Wears dentures, and Wears glasses.   reports that she quit smoking about 8 years ago. Her smoking use included cigarettes. She has a 12.50 pack-year smoking history. She has never used smokeless tobacco.  Past Surgical History:  Procedure Laterality Date  . CHOLECYSTECTOMY     laparosopic attempted, then had to open  . COLONOSCOPY     x2 with polyps removed  . COLONOSCOPY N/A 08/27/2015   Procedure: COLONOSCOPY;  Surgeon: Manus Gunning, MD;  Location: [facility];  Service: Gastroenterology;  Laterality: N/A;  . COLONOSCOPY WITH PROPOFOL N/A 01/17/2019   Procedure: COLONOSCOPY WITH PROPOFOL;  Surgeon: Yetta Flock, MD;  Location: [facility];  Service: Gastroenterology;  Laterality: N/A;  . oopherectomy     1 ovary  removed only  . POLYPECTOMY    . POLYPECTOMY  01/17/2019   Procedure: POLYPECTOMY;  Surgeon: Yetta Flock, MD;  Location: [facility];  Service: Gastroenterology;;  . SALPINGECTOMY     1 fallopian tube removed    Allergies  Allergen Reactions  . Daliresp [Roflumilast] Diarrhea, Nausea And Vomiting and Other (See Comments)    Upset stomach    Immunization History  Administered Date(s) Administered  . Fluad Quad(high  Dose 65+) 03/29/2019  . Influenza Split 06/10/2012, 04/03/2014, 04/20/2015  . Influenza, High Dose Seasonal PF 04/09/2018  . Influenza,inj,Quad PF,6+ Mos 04/24/2013, 03/03/2016  . Influenza-Unspecified 05/29/2017  . PFIZER SARS-COV-2 Vaccination 08/25/2019, 09/15/2019  . Pneumococcal Conjugate-13 05/07/2015  . Pneumococcal Polysaccharide-23 06/10/2012  . Zoster 05/10/2014  . Zoster Recombinat (Shingrix) 07/12/2018    Family History  Problem Relation Age of Onset  . Heart attack Mother   . Pancreatic cancer Mother   . Prostate cancer Father   . Hypertension Father   . Throat cancer Father   . Throat cancer Brother   . Prostate cancer Brother   . Colon cancer Sister   . Brain cancer Sister   . Stomach cancer Neg Hx   . Colon polyps Neg Hx   . Rectal cancer Neg Hx   . Esophageal cancer Neg Hx      Current Outpatient Medications:  .  Acetylcysteine (N-ACETYL-L-CYSTEINE) 600 MG CAPS, Take 600 mg by mouth 2 (two) times a day. , Disp: , Rfl:  .  albuterol (PROVENTIL) (2.5 MG/3ML) 0.083% nebulizer solution, Take 3 mLs (2.5 mg total) by nebulization every 4 (four) hours as needed for wheezing or shortness of breath. Dx code j44.9, Disp: 125 mL, Rfl: 6 .  albuterol (VENTOLIN HFA) 108 (90 Base) MCG/ACT inhaler, INHALE 1 TO 2 PUFFS EVERY 6 HOURS AS NEEDED, Disp: 54 g, Rfl: 3 .  ALPRAZolam (XANAX) 0.5 MG tablet, Take 0.5 mg by mouth at bedtime as needed for anxiety or sleep. , Disp: , Rfl:  .  AMBULATORY NON FORMULARY MEDICATION, VENTILATOR machine at night, Disp: , Rfl:  .  ARNUITY ELLIPTA 100 MCG/ACT AEPB, INHALE 1 PUFF EVERY DAY, Disp: 90 each, Rfl: 3 .  aspirin EC 81 MG tablet, Take 81 mg by mouth daily. , Disp: , Rfl:  .  buPROPion (WELLBUTRIN SR) 150 MG 12 hr tablet, Take 150 mg by mouth daily., Disp: , Rfl:  .  cetirizine (ZYRTEC) 10 MG tablet, Take 10 mg by mouth daily. , Disp: , Rfl:  .  diltiazem (CARDIZEM CD) 180 MG 24 hr capsule, Take 180 mg by mouth daily. , Disp: , Rfl:  .   ferrous sulfate 325 (65 FE) MG tablet, Take 325 mg by mouth daily with breakfast., Disp: , Rfl:  .  fluconazole (DIFLUCAN) 100 MG tablet, Take 1 tablet (100 mg total) by mouth daily., Disp: 7 tablet, Rfl: 0 .  fluticasone (FLONASE) 50 MCG/ACT nasal spray, Place 1 spray into both nostrils at bedtime., Disp: 48 g, Rfl: 3 .  hydrochlorothiazide (HYDRODIURIL) 25 MG tablet, Take 25 mg by mouth daily. , Disp: , Rfl:  .  hydrocortisone 1 % ointment, Pea size amount into rectum twice daily, Disp: 30 g, Rfl: 0 .  metoprolol succinate (TOPROL-XL) 50 MG 24 hr tablet, Take 50 mg by mouth every morning. Take with or immediately following a meal., Disp: , Rfl:  .  nystatin (MYCOSTATIN) 100000 UNIT/ML suspension, Take 5 mLs (500,000 Units total) by mouth 4 (four) times daily., Disp: 120  mL, Rfl: 1 .  omeprazole (PRILOSEC) 20 MG capsule, Take 20 mg by mouth daily. , Disp: , Rfl:  .  OXYGEN, Inhale 2-3 L into the lungs daily., Disp: , Rfl:  .  pravastatin (PRAVACHOL) 80 MG tablet, Take 80 mg by mouth every morning. , Disp: , Rfl:  .  spironolactone (ALDACTONE) 50 MG tablet, Take 25 mg by mouth daily. , Disp: , Rfl:  .  STIOLTO RESPIMAT 2.5-2.5 MCG/ACT AERS, INHALE 2 PUFFS EVERY DAY, Disp: 12 g, Rfl: 3 .  temazepam (RESTORIL) 30 MG capsule, Take 30 mg by mouth at bedtime as needed for sleep. , Disp: , Rfl: 4 .  VITAMIN D PO, Take 5,000 Units by mouth daily. , Disp: , Rfl:  .  doxycycline (VIBRA-TABS) 100 MG tablet, Take 1 tablet (100 mg total) by mouth 2 (two) times daily. (Patient not taking: Reported on 04/30/2020), Disp: 10 tablet, Rfl: 0      Objective:   Vitals:   04/30/20 1002  BP: 106/70  Pulse: 94  Temp: (!) 97.3 F (36.3 C)  TempSrc: Temporal  SpO2: 99%  Weight: 164 lb 6.4 oz (74.6 kg)  Height: 5\' 6"  (1.676 m)    Estimated body mass index is 26.53 kg/m as calculated from the following:   Height as of this encounter: 5\' 6"  (1.676 m).   Weight as of this encounter: 164 lb 6.4 oz (74.6  kg).  @WEIGHTCHANGE @  [employer]   04/30/20 1002  Weight: 164 lb 6.4 oz (74.6 kg)     Physical Exam General: No distress.   Neuro: Alert and Oriented x 3. GCS 15. Speech normal Psych: Pleasant Resp: Clear to ausucultation bilaterally. No wheeze No crackles. BARRELL CHEST CVS: Normal heart sounds. Murmurs - no HEENT: Normal upper airway. PEERL +. No post nasal drip. NO THRUSH       Assessment:       ICD-10-CM   1. Stage 4 very severe COPD by GOLD classification ([place])  J44.9   2. Chronic respiratory failure with hypoxia ([facility])  J96.11   3. Weight loss, unintentional  R63.4   4. Cancer screening  Z12.9   5. Oral thrush  B37.0   6. Vaccine counseling  Z71.89        Plan:     Patient Instructions  Stage 4 very severe COPD by GOLD classification ([place]) Chronic respiratory failure with hypoxia ([place]) Former smoker  - stable  Plan - continue o2, flonase and night bipap scheduled  - instead of arnuity and stiolto try BREZTRI 2 puff twice daily samples for 1 month   - if BREZTRI better - then call us and we can try insurance approval  -albuterol as needed  Weight loss - ? Due to grief reaction in April 2021  - weight 01/29/2020 - 163# - weight 04/30/2020 - 164# and stable  Pl;an  - monitor  Cancer screening  -CT scan November 2020 without any evidence of lung cancer   Plan -Repeat low-dose CT scan of the chest in 3 months  \ Oral thrush -persisent - # Oral thrush - resolved 04/30/2020 after June diflucan  Plan  - monitor  Vaccine counseling   - get high dose flu shot  - get covid vaccine booster  - ok to take them apart  Followup 3 months do CT chest without contrast Return in 3 months for 15 min visit      SIGNATURE    Dr. Brand Males, M.D., F.C.C.P,  Pulmonary and Critical  Care Medicine Staff Physician, [facility] Director - Interstitial Lung Disease  Program  [facility] [place] at  [place], [place], [address]  Pager: [phone], If no answer or between  15:00h - 7:00h: call [phone] Telephone: [phone]  10:30 AM 04/30/2020

## 2020-04-30 NOTE — Telephone Encounter (Signed)
Spoke with Patient. Patient stated MR recommended her waiting 1 week between high dose flu and covid booster.  Patient is scheduled to get covid booster tomorrow. Patient scheduled high dose flu vaccine 05/09/20, at 1030.

## 2020-04-30 NOTE — Patient Instructions (Addendum)
Stage 4 very severe COPD by GOLD classification ([place]) Chronic respiratory failure with hypoxia ([place]) Former smoker  - stable  Plan - continue o2, flonase and night bipap scheduled  - instead of arnuity and stiolto try BREZTRI 2 puff twice daily samples for 1 month   - if BREZTRI better - then call us and we can try insurance approval  -albuterol as needed  Weight loss - ? Due to grief reaction in April 2021  - weight 01/29/2020 - 163# - weight 04/30/2020 - 164# and stable  Pl;an  - monitor  Cancer screening  -CT scan November 2020 without any evidence of lung cancer   Plan -Repeat low-dose CT scan of the chest in 3 months  \ Oral thrush -persisent - # Oral thrush - resolved 04/30/2020 after June diflucan  Plan  - monitor  Vaccine counseling   - get high dose flu shot  - get covid vaccine booster  - ok to take them apart  Followup 3 months do CT chest without contrast Return in 3 months for 15 min visit

## 2020-05-01 DIAGNOSIS — J449 Chronic obstructive pulmonary disease, unspecified: Secondary | ICD-10-CM | POA: Diagnosis not present

## 2020-05-01 DIAGNOSIS — J9611 Chronic respiratory failure with hypoxia: Secondary | ICD-10-CM | POA: Diagnosis not present

## 2020-05-09 ENCOUNTER — Other Ambulatory Visit: Payer: Self-pay

## 2020-05-09 ENCOUNTER — Ambulatory Visit (INDEPENDENT_AMBULATORY_CARE_PROVIDER_SITE_OTHER): Payer: Medicare HMO

## 2020-05-09 DIAGNOSIS — Z23 Encounter for immunization: Secondary | ICD-10-CM | POA: Diagnosis not present

## 2020-05-10 MED ORDER — BREZTRI AEROSPHERE 160-9-4.8 MCG/ACT IN AERO: 2.0000 | INHALATION_SPRAY | Freq: Two times a day (BID) | RESPIRATORY_TRACT | 3 refills | Status: DC

## 2020-05-16 DIAGNOSIS — J449 Chronic obstructive pulmonary disease, unspecified: Secondary | ICD-10-CM | POA: Diagnosis not present

## 2020-05-31 DIAGNOSIS — J449 Chronic obstructive pulmonary disease, unspecified: Secondary | ICD-10-CM | POA: Diagnosis not present

## 2020-05-31 DIAGNOSIS — J9611 Chronic respiratory failure with hypoxia: Secondary | ICD-10-CM | POA: Diagnosis not present

## 2020-06-06 ENCOUNTER — Other Ambulatory Visit: Payer: Self-pay

## 2020-06-06 ENCOUNTER — Ambulatory Visit (INDEPENDENT_AMBULATORY_CARE_PROVIDER_SITE_OTHER)
Admission: RE | Admit: 2020-06-06 | Discharge: 2020-06-06 | Disposition: A | Discharge status: Home / Self Care | Payer: Medicare HMO | Source: Ambulatory Visit | Attending: Acute Care | Admitting: Acute Care

## 2020-06-06 DIAGNOSIS — Z87891 Personal history of nicotine dependence: Secondary | ICD-10-CM

## 2020-06-06 DIAGNOSIS — Z122 Encounter for screening for malignant neoplasm of respiratory organs: Secondary | ICD-10-CM

## 2020-06-13 NOTE — Progress Notes (Signed)
Please call patient and let them  know their  low dose Ct was read as a Lung RADS 2: nodules that are benign in appearance and behavior with a very low likelihood of becoming a clinically active cancer due to size or lack of growth. Recommendation per radiology is for a repeat LDCT in 12 months. .Please let them  know we will order and schedule their  annual screening scan for 06/2021. Please let them  know there was notation of CAD on their  scan.  Please remind the patient  that this is a non-gated exam therefore degree or severity of disease  cannot be determined. Please have them  follow up with their PCP regarding potential risk factor modification, dietary therapy or pharmacologic therapy if clinically indicated. Pt.  is  currently on statin therapy. Please place order for annual  screening scan for  06/2021 and fax results to PCP. Thanks so much.  Langley Gauss, please have patient talk with PCP about her CAD. Thanks so much

## 2020-06-16 DIAGNOSIS — J449 Chronic obstructive pulmonary disease, unspecified: Secondary | ICD-10-CM | POA: Diagnosis not present

## 2020-06-18 ENCOUNTER — Telehealth: Payer: Self-pay | Admitting: Acute Care

## 2020-06-18 DIAGNOSIS — Z87891 Personal history of nicotine dependence: Secondary | ICD-10-CM

## 2020-06-18 NOTE — Telephone Encounter (Signed)
Pt informed of CT results per Sarah Groce, NP.  PT verbalized understanding.  Copy sent to PCP.  Order placed for 1 yr f/u CT.  

## 2020-06-25 DIAGNOSIS — E785 Hyperlipidemia, unspecified: Secondary | ICD-10-CM | POA: Diagnosis not present

## 2020-07-01 DIAGNOSIS — J9611 Chronic respiratory failure with hypoxia: Secondary | ICD-10-CM | POA: Diagnosis not present

## 2020-07-01 DIAGNOSIS — J449 Chronic obstructive pulmonary disease, unspecified: Secondary | ICD-10-CM | POA: Diagnosis not present

## 2020-07-02 DIAGNOSIS — I10 Essential (primary) hypertension: Secondary | ICD-10-CM | POA: Diagnosis not present

## 2020-07-02 DIAGNOSIS — F5104 Psychophysiologic insomnia: Secondary | ICD-10-CM | POA: Diagnosis not present

## 2020-07-02 DIAGNOSIS — Z9981 Dependence on supplemental oxygen: Secondary | ICD-10-CM | POA: Diagnosis not present

## 2020-07-02 DIAGNOSIS — J439 Emphysema, unspecified: Secondary | ICD-10-CM | POA: Diagnosis not present

## 2020-07-02 DIAGNOSIS — E785 Hyperlipidemia, unspecified: Secondary | ICD-10-CM | POA: Diagnosis not present

## 2020-07-02 DIAGNOSIS — Z0000 Encounter for general adult medical examination without abnormal findings: Secondary | ICD-10-CM | POA: Diagnosis not present

## 2020-07-02 DIAGNOSIS — I2781 Cor pulmonale (chronic): Secondary | ICD-10-CM | POA: Diagnosis not present

## 2020-07-02 DIAGNOSIS — R82998 Other abnormal findings in urine: Secondary | ICD-10-CM | POA: Diagnosis not present

## 2020-07-02 DIAGNOSIS — I700 Atherosclerosis of aorta: Secondary | ICD-10-CM | POA: Diagnosis not present

## 2020-07-02 DIAGNOSIS — K219 Gastro-esophageal reflux disease without esophagitis: Secondary | ICD-10-CM | POA: Diagnosis not present

## 2020-07-16 DIAGNOSIS — J449 Chronic obstructive pulmonary disease, unspecified: Secondary | ICD-10-CM | POA: Diagnosis not present

## 2020-07-17 ENCOUNTER — Other Ambulatory Visit: Payer: Self-pay

## 2020-07-17 ENCOUNTER — Encounter: Payer: Self-pay | Admitting: Internal Medicine

## 2020-07-17 ENCOUNTER — Ambulatory Visit (INDEPENDENT_AMBULATORY_CARE_PROVIDER_SITE_OTHER): Payer: Medicare HMO | Admitting: Internal Medicine

## 2020-07-17 VITALS — BP 122/70 | HR 84 | Temp 97.8°F | Ht 66.0 in | Wt 172.6 lb

## 2020-07-17 DIAGNOSIS — J449 Chronic obstructive pulmonary disease, unspecified: Secondary | ICD-10-CM | POA: Diagnosis not present

## 2020-07-17 DIAGNOSIS — J9611 Chronic respiratory failure with hypoxia: Secondary | ICD-10-CM

## 2020-07-17 DIAGNOSIS — Z129 Encounter for screening for malignant neoplasm, site unspecified: Secondary | ICD-10-CM

## 2020-07-17 NOTE — Progress Notes (Signed)
07/12/2018 -   Chief Complaint  Patient presents with  . Follow-up    pt states breathing is baseline. c/o occ prod cough with yellowish to white mucus.     HPI EVAH RASHID 64 y.o. 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  She continues on nocturnal BiPAP.  This is really improved her symptom score.  COPD CAT score is now 14.  She is on triple inhaler therapy and oxygen as well.  #2019 CT scan of the chest did not show any evidence of lung cancer.  Annual screening is recommended.  Her new issues this visit are that she has unintentional weight loss of 12 pounds.  She is frustrated by this.  She has seen GI and is seeing a primary care physician for this.  She is also asking for travel advice encounter.  She is going to [place] for 4 days for the weekend with her friends to celebrate her recent divorce.  She is asking for preemptive antibiotic and prednisone    OV 04/18/2019  Subjective:  Patient ID: [name], female , DOB: Aug 18, 1955 , age 2 y.o. , MRN: [id number] , ADDRESS: [address] [place] [address]   04/18/2019 -   Chief Complaint  Patient presents with  . Stage 4 very severe COPD by GOLD classification    Breathing is the same as at last visit in December 2019     HPI CASADY VOSHELL 64 y.o. -follows up for end-stage Gold stage IV COPD.  Last seen early part of 2020.  After the onset of the pandemic she has hunker down and socially distanced.  She has limited her activities to very low risk.  She goes out to the grocery store with a mask.  She has avoided people.  She continues to be on Arnuity and Stiolto and nighttime BiPAP with Flonase and daily schedule oxygen no new problems.  She just wants refills of doxycycline and prednisone in case of a future exacerbation.  But otherwise doing well.  CAT score shows stability.         OV 06/14/2019  Subjective:  Patient ID: [name], female , DOB:  Apr 28, 1956 , age 72 y.o. , MRN: [id number] , ADDRESS: [address] [place] [address]   06/14/2019 -   Chief Complaint  Patient presents with  . Follow-up    Pt states she has been doing well since last visit and denies any complaints.   Follow-up stage IV COPD with chronic hypoxemic respiratory failure  HPI LATIVIA VELIE 64 y.o. -presents for follow-up.  Last saw her 2 months ago.  She was supposed to come between 3 and 6 months.  However due to some scheduling issues she is back in 2 months.  She has no new symptoms.  COPD CAT score is listed below and stable.  She is on oxygen daytime, Flonase, Arnuity, Stiolto and night BiPAP.  She is doing well.  She had a low-dose CT scan of the chest and findings of benign and a repeat CT scan in 1 year has been recommended.    IMPRESSION: 1. Lung-RADS 2, benign appearance or behavior. Continue annual screening with low-dose chest CT without contrast in 12 months. 2. Aortic Atherosclerosis (ICD10-I70.0) and Emphysema (ICD10-J43.9).   Electronically Signed   By: Misty Stanley M.D.   On: 06/05/2019 11:48 ROS - per HPI   OV 10/11/2019  Subjective:  Patient  ID: [name], female , DOB: 1955-11-22 , age 25 y.o. , MRN: [id number] , ADDRESS: [address] [place] [address]   10/11/2019 -   Chief Complaint  Patient presents with  . Follow-up    Pt states she has been doing good since last visit and denies any current complaints.     HPI MASHANDA ISHIBASHI 64 y.o. -presents for COPD follow-up.  Last seen January 2020.  She continues on triple inhaler therapy oxygen at nighttime BiPAP.  Overall stable.  She has had a Covid vaccine.  She is isolating well in social distancing and masking.  Cancer screening: Last CT scan of the chest low-dose CT chest Nov 2020 without any evidence of cancer.  Follow-up recommended in 1 year.  Her advanced COPD makes it difficult to have any successful intervention.  She is agreed to  have her CT chest in 15 months - 18 months which would be in early 2022.  I reviewed and interpreted the result myself.  New issue: On exam she had oral thrush.  This is new.  Last nystatin intake was over a year ago according to her history.  IMPRESSION: 1. Lung-RADS 2, benign appearance or behavior. Continue annual screening with low-dose chest CT without contrast in 12 months. 2. Aortic Atherosclerosis (ICD10-I70.0) and Emphysema (ICD10-J43.9).   Electronically Signed   By: Misty Stanley M.D.   On: 06/05/2019 11:48 ROS - per HPI   OV 01/29/2020  Subjective:  Patient ID: [name], female , DOB: 1956/05/17 , age 69 y.o. , MRN: [id number] , ADDRESS: [address] [place] [address]   01/29/2020 -   Chief Complaint  Patient presents with  . Follow-up    cough with "little bit of light yellow sputum"     HPI LILANA BLASKO 64 y.o. -returns for follow-up of for her advanced COPD.  She remains on oxygen.  She is on inhaler therapy and nighttime BiPAP.  She tells me that [facility] family pharmacy is shut down and run out of business.  She wants another pharmacy to pick up her albuterol nebulizer.  Other than that in Dec 05, 2019 her stepfather died at age 54s because of renal failure.  She is in grief reaction.  Sometime before that she started losing weight.  Currently weight is 163 pounds.  It is unintentional.  However she thinks it is because of grief reaction.  In addition for the last 1 week she has a little bit of light yellow sputum there is a change in color.  There is no increased wheezing or no increased cough.  Nevertheless she think she will benefit from antibiotics.  She wants to keep prednisone handy.  Last visit we treated her for oral thrush with nystatin swish and swallow.  This visit she still has oral thrush.  She says she rinses her mouth pretty diligently.     OV 04/30/2020   Subjective:  Patient ID: [name], female , DOB: [birth year], age  75 y.o. years. , MRN: [id number],  ADDRESS: [address] [place] [address] PCP  Leanna Battles, MD Providers : Treatment Team:  Attending Provider: Brand Males, MD   Chief Complaint  Patient presents with  . 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 -returns for routine follow-up.  She is on oxygen 3 L nasal cannula at nighttime BiPAP, Flonase, Arnuity and  Stiolto.  Overall she is feeling well.  Last visit she had persistent thrush.  She took Diflucan.  She thinks she has persistent thrush this time as well but upon close examination there is no more thrush.  Ritta Slot is resolved.  She was having weight loss following death of her sister but this seems to have stabilized weight is 164 pounds and stable.  Her last scans for lung cancer screening was November 2020.  She is coming up 1 year.  She is not having any hemoptysis.  Overall she feels stable.  She is in need of a flu shot and Covid booster vaccine.  She is willing to have these.  She did do a walking desaturation test for [place] on 2 L.  Resting heart rate was 89/min.  Pulse ox was 87% on RA at rest. Pulse ox 99% at rest on 2L.  She walked 1 lap on 2 L continuous and her pulse ox dropped to 81%.  Heart rate was 99/min. AT home use 3L    OV 07/17/2020   Subjective:  Patient ID: [name], female , DOB: Dec 06, 1955, age 55 y.o. years. , MRN: [id number],  ADDRESS: [place] [address] PCP  Leanna Battles, MD Providers : Treatment Team:  Attending Provider: Brand Males, MD Patient Care Team: Leanna Battles, MD as PCP - General (Internal Medicine)    Chief Complaint  Patient presents with  . Follow-up    Patient wears 3 liters oxygen all the time. Patient fells good overall. Has been started on Nitro       HPI GRABIELA WOHLFORD 64 y.o. -returns for routine follow-up of her COPD.  Her grief reaction has improved after the loss of  her sister.  Her weight loss is also improved.  She has gained weight.  COPD stable.  Her triple inhaler therapy isBREZTRI and subjectively she feels this is the best inhaler ever.  She is on an acetylcysteine.  She is on oxygen.  She had a low-dose CT scan of the chest.  Annual screening is recommended.  There is no pulmonary fibrosis no new issues.  Her FEV1 is a few years ago and her DLCO last was several years ago.  Based on both of that she might qualify for a referral for ZEPHRYV valve consideration .  We went over videos of this and discussed this in detail.  She is interested in a referral.  But for this  she needs recent PFT    CAT Score 01/29/2020 10/11/2019 01/02/2019  Total CAT Score 18 25 13       CAT COPD Symptom & Quality of Life Score ([place]) 0 is no burden. 5 is highest burden 12/31/2016  03/09/2017  10/11/2017  01/26/2018  04/28/2018 With nigight bipap 07/12/2018 onging bipap qhs 04/18/2019  06/14/2019   Never Cough -> Cough all the time 3 2 4 3 3 2 2 2   No phlegm in chest -> Chest is full of phlegm 3 2 3 4 2 2 1 2   No chest tightness -> Chest feels very tight 3 3 3 3  0 0 1 1  No dyspnea for 1 flight stairs/hill -> Very dyspneic for 1 flight of stairs 5 5 5 5 4 4 4 4   No limitations for ADL at home -> Very limited with ADL at home 5 3 5 5 5 2 2 2   Confident leaving home -> Not at all confident leaving home 2 5 3 4 3  0  1 2  Sleep soundly -> Do not sleep soundly because of lung condition 2 4 3 4 3 4 3 2   Lots of Energy -> No energy at all 4 3 3 3 4  0 3 3  TOTAL Score (max 40)  27 27 30 31 24 14 17 18     ROS - per HPI  PFT Results Latest Ref Rng & Units 12/12/2014 09/11/2014 06/21/2014 03/26/2014 01/04/2014 12/08/2013 11/22/2013  FVC-Pre L 1.53 1.76 1.69 1.67 1.60 1.53 1.53  FVC-Predicted Pre % 50 58 55 54 52 50 50  FVC-Post L 1.80 1.78 1.69 1.80 1.64 1.82 1.80  FVC-Predicted Post % 59 58 55 59 53 59 59  Pre FEV1/FVC % % 44 45 48 47 44 47 44  Post FEV1/FCV % % 48 47 46 47 48  45 48  FEV1-Pre L 0.68 0.80 0.81 0.78 0.70 0.71 0.68  FEV1-Predicted Pre % 28 33 33 32 29 29 28   FEV1-Post L 0.86 0.83 0.78 0.85 0.78 0.81 0.86   DLCO - 23% in 2013   IMPRESSION: C chgest 1. Lung-RADS 2S, benign appearance or behavior. Continue annual screening with low-dose chest CT without contrast in 12 months. 2. The "S" modifier above refers to potentially clinically significant non lung cancer related findings. Specifically, there is aortic atherosclerosis, in addition to 3 vessel coronary artery disease. Please note that although the presence of coronary artery calcium documents the presence of coronary artery disease, the severity of this disease and any potential stenosis cannot be assessed on this non-gated CT examination. Assessment for potential risk factor modification, dietary therapy or pharmacologic therapy may be warranted, if clinically indicated. 3. Mild diffuse bronchial wall thickening with mild centrilobular and paraseptal emphysema; imaging findings suggestive of underlying COPD.  Aortic Atherosclerosis (ICD10-I70.0) and Emphysema (ICD10-J43.9).   Electronically Signed   By: Vinnie Langton M.D.   On: 06/07/2020 08:43    has a past medical history of Allergy, Anxiety, Arthritis, Chest pain, Chronic respiratory failure ([place]), COPD (chronic obstructive pulmonary disease) ([place]), Depression, Emphysema of lung (HCC), Fatigue, GERD (gastroesophageal reflux disease), Hemorrhoids, History of colon polyps, Hyperlipidemia, Hypertension, Iron deficiency anemia, On home oxygen therapy, Oxygen deficiency, Palpitations, Pulmonary nodules, Seasonal allergies, Wears dentures, and Wears glasses.   reports that she quit smoking about 8 years ago. Her smoking use included cigarettes. She has a 12.50 pack-year smoking history. She has never used smokeless tobacco.  Past Surgical History:  Procedure Laterality Date  . CHOLECYSTECTOMY     laparosopic attempted, then had to  open  . COLONOSCOPY     x2 with polyps removed  . COLONOSCOPY N/A 08/27/2015   Procedure: COLONOSCOPY;  Surgeon: Manus Gunning, MD;  Location: [facility];  Service: Gastroenterology;  Laterality: N/A;  . COLONOSCOPY WITH PROPOFOL N/A 01/17/2019   Procedure: COLONOSCOPY WITH PROPOFOL;  Surgeon: Yetta Flock, MD;  Location: [facility];  Service: Gastroenterology;  Laterality: N/A;  . oopherectomy     1 ovary removed only  . POLYPECTOMY    . POLYPECTOMY  01/17/2019   Procedure: POLYPECTOMY;  Surgeon: Yetta Flock, MD;  Location: [facility];  Service: Gastroenterology;;  . SALPINGECTOMY     1 fallopian tube removed    Allergies  Allergen Reactions  . Daliresp [Roflumilast] Diarrhea, Nausea And Vomiting and Other (See Comments)    Upset stomach    Immunization History  Administered Date(s) Administered  . Fluad Quad(high Dose 65+) 03/29/2019, 05/09/2020  . Influenza Split 06/10/2012, 04/03/2014, 04/20/2015  . Influenza,  High Dose Seasonal PF 04/09/2018  . Influenza,inj,Quad PF,6+ Mos 04/24/2013, 03/03/2016  . Influenza-Unspecified 05/29/2017  . PFIZER SARS-COV-2 Vaccination 08/25/2019, 09/15/2019, 05/01/2020  . Pneumococcal Conjugate-13 05/07/2015  . Pneumococcal Polysaccharide-23 06/10/2012  . Zoster 05/10/2014  . Zoster Recombinat (Shingrix) 07/12/2018    Family History  Problem Relation Age of Onset  . Heart attack Mother   . Pancreatic cancer Mother   . Prostate cancer Father   . Hypertension Father   . Throat cancer Father   . Throat cancer Brother   . Prostate cancer Brother   . Colon cancer Sister   . Brain cancer Sister   . Stomach cancer Neg Hx   . Colon polyps Neg Hx   . Rectal cancer Neg Hx   . Esophageal cancer Neg Hx      Current Outpatient Medications:  .  Acetylcysteine (N-ACETYL-L-CYSTEINE) 600 MG CAPS, Take 600 mg by mouth 2 (two) times a day. , Disp: , Rfl:  .  albuterol (PROVENTIL) (2.5 MG/3ML) 0.083% nebulizer  solution, Take 3 mLs (2.5 mg total) by nebulization every 4 (four) hours as needed for wheezing or shortness of breath. Dx code j44.9, Disp: 125 mL, Rfl: 6 .  albuterol (VENTOLIN HFA) 108 (90 Base) MCG/ACT inhaler, INHALE 1 TO 2 PUFFS EVERY 6 HOURS AS NEEDED, Disp: 54 g, Rfl: 3 .  ALPRAZolam (XANAX) 0.5 MG tablet, Take 0.5 mg by mouth at bedtime as needed for anxiety or sleep. , Disp: , Rfl:  .  AMBULATORY NON FORMULARY MEDICATION, VENTILATOR machine at night, Disp: , Rfl:  .  aspirin EC 81 MG tablet, Take 81 mg by mouth daily. , Disp: , Rfl:  .  Budeson-Glycopyrrol-Formoterol (BREZTRI AEROSPHERE) 160-9-4.8 MCG/ACT AERO, Inhale 2 puffs into the lungs in the morning and at bedtime., Disp: 32.1 g, Rfl: 3 .  buPROPion (WELLBUTRIN SR) 150 MG 12 hr tablet, Take 150 mg by mouth daily., Disp: , Rfl:  .  cetirizine (ZYRTEC) 10 MG tablet, Take 10 mg by mouth daily. , Disp: , Rfl:  .  diltiazem (CARDIZEM CD) 180 MG 24 hr capsule, Take 180 mg by mouth daily. , Disp: , Rfl:  .  doxycycline (VIBRA-TABS) 100 MG tablet, Take 1 tablet (100 mg total) by mouth 2 (two) times daily., Disp: 10 tablet, Rfl: 0 .  ferrous sulfate 325 (65 FE) MG tablet, Take 325 mg by mouth daily with breakfast., Disp: , Rfl:  .  fluconazole (DIFLUCAN) 100 MG tablet, Take 1 tablet (100 mg total) by mouth daily., Disp: 7 tablet, Rfl: 0 .  fluticasone (FLONASE) 50 MCG/ACT nasal spray, Place 1 spray into both nostrils at bedtime., Disp: 48 g, Rfl: 3 .  hydrochlorothiazide (HYDRODIURIL) 25 MG tablet, Take 25 mg by mouth daily. , Disp: , Rfl:  .  hydrocortisone 1 % ointment, Pea size amount into rectum twice daily, Disp: 30 g, Rfl: 0 .  metoprolol succinate (TOPROL-XL) 50 MG 24 hr tablet, Take 50 mg by mouth every morning. Take with or immediately following a meal., Disp: , Rfl:  .  nystatin (MYCOSTATIN) 100000 UNIT/ML suspension, Take 5 mLs (500,000 Units total) by mouth 4 (four) times daily., Disp: 120 mL, Rfl: 1 .  omeprazole (PRILOSEC) 20 MG  capsule, Take 20 mg by mouth daily. , Disp: , Rfl:  .  OXYGEN, Inhale 2-3 L into the lungs daily., Disp: , Rfl:  .  pravastatin (PRAVACHOL) 80 MG tablet, Take 80 mg by mouth every morning. , Disp: , Rfl:  .  spironolactone (  ALDACTONE) 50 MG tablet, Take 25 mg by mouth daily. , Disp: , Rfl:  .  temazepam (RESTORIL) 30 MG capsule, Take 30 mg by mouth at bedtime as needed for sleep. , Disp: , Rfl: 4 .  VITAMIN D PO, Take 5,000 Units by mouth daily. , Disp: , Rfl:       Objective:   Vitals:   07/17/20 1003  BP: 122/70  Pulse: 84  Temp: 97.8 F (36.6 C)  TempSrc: Temporal  SpO2: 99%  Weight: 172 lb 9.6 oz (78.3 kg)  Height: 5\' 6"  (1.676 m)    Estimated body mass index is 27.86 kg/m as calculated from the following:   Height as of this encounter: 5\' 6"  (1.676 m).   Weight as of this encounter: 172 lb 9.6 oz (78.3 kg).  @WEIGHTCHANGE @  [employer]   07/17/20 1003  Weight: 172 lb 9.6 oz (78.3 kg)     Physical Exam  General: No distress. Looks well Neuro: Alert and Oriented x 3. GCS 15. Speech normal Psych: Pleasant Resp:  Barrel Chest - yes.  Wheeze - no, Crackles - no, No overt respiratory distress CVS: Normal heart sounds. Murmurs - no Ext: Stigmata of Connective Tissue Disease - no HEENT: Normal upper airway. PEERL +. No post nasal drip        Assessment:       ICD-10-CM   1. Stage 4 very severe COPD by GOLD classification ([place])  J44.9   2. Chronic respiratory failure with hypoxia (HCC)  J96.11   3. Cancer screening  Z12.9        Plan:     Patient Instructions     ICD-10-CM   1. Stage 4 very severe COPD by GOLD classification ([place])  J44.9   2. Chronic respiratory failure with hypoxia (HCC)  J96.11   3. Cancer screening  Z12.9     COPD stable and CT scan does not show any evidence of lung cancer Glad you are up-to-date with all vaccines We discussed ZEPHYR valve and you are interested in this care option   Plan -Continue oxygen as  before -Continue triple inhaler therapy with BREZTRI as before -Continue albuterol as needed -Continue N-acetylcysteine for COPD exacerbation prevention -Do full pulmonary function test in the next few weeks to several weeks first available  -If the FEV1 is less than 45% and the DLCO is greater than 15% we could refer you to [facility] interventional pulmonology for [facility] valve consideration  Follow-up -Nurse practitioner telephone visit or face-to-face visit to discuss PFT results and referral to [facility] interventional pulmonology     SIGNATURE    Dr. Brand Males, M.D., F.C.C.P,  Pulmonary and Critical Care Medicine Staff Physician, [place] Director - Interstitial Lung Disease  Program  Pulmonary [place] at [place], [place], [address]  Pager: [phone], If no answer or between  15:00h - 7:00h: call [phone] Telephone: [phone]  10:55 AM 07/17/2020

## 2020-07-17 NOTE — Patient Instructions (Addendum)
ICD-10-CM   1. Stage 4 very severe COPD by GOLD classification ([place])  J44.9   2. Chronic respiratory failure with hypoxia (HCC)  J96.11   3. Cancer screening  Z12.9     COPD stable and CT scan does not show any evidence of lung cancer Glad you are up-to-date with all vaccines We discussed ZEPHYR valve and you are interested in this care option   Plan -Continue oxygen as before -Continue triple inhaler therapy with BREZTRI as before -Continue albuterol as needed -Continue N-acetylcysteine for COPD exacerbation prevention -Do full pulmonary function test in the next few weeks to several weeks first available  -If the FEV1 is less than 45% and the DLCO is greater than 15% we could refer you to [facility] interventional pulmonology for [facility] valve consideration  Follow-up -Nurse practitioner telephone visit or face-to-face visit to discuss PFT results and referral to [facility] interventional pulmonology

## 2020-07-22 ENCOUNTER — Other Ambulatory Visit: Payer: Self-pay | Admitting: *Deleted

## 2020-07-22 ENCOUNTER — Telehealth: Payer: Self-pay | Admitting: Internal Medicine

## 2020-07-22 DIAGNOSIS — J449 Chronic obstructive pulmonary disease, unspecified: Secondary | ICD-10-CM

## 2020-07-22 NOTE — Telephone Encounter (Signed)
Patient sent email today   I  did some more research on this procedure. I informed the nurse when I first got there (07/17/20) I was now taking Nitroglycerin for chest pain and Triple Vessel CAD.  Studies show if anyone with  any type of heart disease would not be a good candidate for this procedure. I would prefer not to move forward on this. My appointment to do the Pulmonary Function Test is scheduled for 07/23/20 at 10:00 am. Please cancel. I apologize for any inconvenience. Thank You Katie Greene  Sending to MR as Katie Greene

## 2020-07-22 NOTE — Telephone Encounter (Signed)
Sounds good  Thanks    SIGNATURE    Dr. Brand Males, M.D., F.C.C.P,  Pulmonary and Critical Care Medicine Staff Physician, [place] Director - Interstitial Lung Disease  Program  Pulmonary [place] at [place], [place], [address]  Pager: [phone], If no answer  OR between  19:00-7:00h: page [phone] Telephone (clinical office): [phone] Telephone (research): [phone]  2:54 PM 07/22/2020

## 2020-07-31 ENCOUNTER — Ambulatory Visit: Payer: Medicare HMO | Admitting: Pulmonary Disease

## 2020-07-31 DIAGNOSIS — J449 Chronic obstructive pulmonary disease, unspecified: Secondary | ICD-10-CM | POA: Diagnosis not present

## 2020-07-31 DIAGNOSIS — J9611 Chronic respiratory failure with hypoxia: Secondary | ICD-10-CM | POA: Diagnosis not present

## 2020-08-09 ENCOUNTER — Ambulatory Visit (INDEPENDENT_AMBULATORY_CARE_PROVIDER_SITE_OTHER): Payer: Medicare HMO | Admitting: Internal Medicine

## 2020-08-09 ENCOUNTER — Other Ambulatory Visit: Payer: Self-pay

## 2020-08-09 DIAGNOSIS — J449 Chronic obstructive pulmonary disease, unspecified: Secondary | ICD-10-CM

## 2020-08-09 NOTE — Progress Notes (Signed)
Full PFT completed today ? ?

## 2020-08-16 DIAGNOSIS — J449 Chronic obstructive pulmonary disease, unspecified: Secondary | ICD-10-CM | POA: Diagnosis not present

## 2020-08-23 ENCOUNTER — Telehealth: Payer: Self-pay | Admitting: Pulmonary Disease

## 2020-08-23 NOTE — Telephone Encounter (Signed)
  It hast be formally read and released which I probably will do over next week. Please let her know and send back

## 2020-08-23 NOTE — Telephone Encounter (Signed)
I have called and spoke with pt and she is aware of MR recs.  Will forward message back to MR so he can follow up

## 2020-08-23 NOTE — Telephone Encounter (Signed)
MR please advise on 08/09/20 PFT results.  Thanks!

## 2020-08-26 LAB — PULMONARY FUNCTION TEST
DL/VA % pred: 39 %
DL/VA: 1.6 ml/min/mmHg/L
DLCO cor % pred: 22 %
DLCO cor: 4.97 ml/min/mmHg
DLCO unc % pred: 22 %
DLCO unc: 4.97 ml/min/mmHg
FEF 25-75 Post: 0.25 L/sec
FEF 25-75 Pre: 0.23 L/sec
FEF2575-%Change-Post: 8 %
FEF2575-%Pred-Post: 12 %
FEF2575-%Pred-Pre: 11 %
FEV1-%Change-Post: 1 %
FEV1-%Pred-Post: 27 %
FEV1-%Pred-Pre: 26 %
FEV1-Post: 0.62 L
FEV1-Pre: 0.61 L
FEV1FVC-%Change-Post: -2 %
FEV1FVC-%Pred-Pre: 54 %
FEV6-%Change-Post: 2 %
FEV6-%Pred-Post: 50 %
FEV6-%Pred-Pre: 49 %
FEV6-Post: 1.42 L
FEV6-Pre: 1.39 L
FEV6FVC-%Change-Post: -2 %
FEV6FVC-%Pred-Post: 97 %
FEV6FVC-%Pred-Pre: 99 %
FVC-%Change-Post: 4 %
FVC-%Pred-Post: 52 %
FVC-%Pred-Pre: 49 %
FVC-Post: 1.51 L
FVC-Pre: 1.44 L
Post FEV1/FVC ratio: 41 %
Post FEV6/FVC ratio: 94 %
Pre FEV1/FVC ratio: 42 %
Pre FEV6/FVC Ratio: 96 %
RV % pred: 134 %
RV: 3.06 L
TLC % pred: 88 %
TLC: 4.91 L

## 2020-08-26 NOTE — Telephone Encounter (Signed)
Left message for patient to call back tomorrow for results.

## 2020-08-26 NOTE — Telephone Encounter (Signed)
Lungs are getting weaker - now only at 0.6L - 26%  Plan  - did she [facility] for endobronchial valve - also refer for lung transplant eval  PFT Results Latest Ref Rng & Units 08/09/2020 12/12/2014 09/11/2014 06/21/2014 03/26/2014 01/04/2014 12/08/2013  FVC-Pre L 1.44 1.53 1.76 1.69 1.67 1.60 1.53  FVC-Predicted Pre % 49 50 58 55 54 52 50  FVC-Post L 1.51 1.80 1.78 1.69 1.80 1.64 1.82  FVC-Predicted Post % 52 59 58 55 59 53 59  Pre FEV1/FVC % % 42 44 45 48 47 44 47  Post FEV1/FCV % % 41 48 47 46 47 48 45  FEV1-Pre L 0.61 0.68 0.80 0.81 0.78 0.70 0.71  FEV1-Predicted Pre % 26 28 33 33 32 29 29  FEV1-Post L 0.62 0.86 0.83 0.78 0.85 0.78 0.81  DLCO uncorrected ml/min/mmHg 4.97 - - - - - -  DLCO UNC% % 22 - - - - - -  DLCO corrected ml/min/mmHg 4.97 - - - - - -  DLCO COR %Predicted % 22 - - - - - -  DLVA Predicted % 39 - - - - - -  TLC L 4.91 - - - - - -  TLC % Predicted % 88 - - - - - -  RV % Predicted % 134 - - - - - -

## 2020-08-29 NOTE — Telephone Encounter (Signed)
Spoke with patient. She verbalized understanding of results.  She stated that she had already discussed with MR that she is not comfortable with any invasive procedures now due to her cardiac issues. She wants to know if there is anything else she can do at this point that does not involve surgery.

## 2020-08-29 NOTE — Telephone Encounter (Signed)
Lmtcb for pt.  

## 2020-08-29 NOTE — Telephone Encounter (Signed)
Patient is returning phone call. Patient phone number is [phone].

## 2020-08-30 NOTE — Telephone Encounter (Signed)
No other new interventions that I can think of

## 2020-08-30 NOTE — Telephone Encounter (Signed)
I called and spoke with the pt and notified of response per Dr Ramaswamy  She verbalized understanding  Nothing further needed 

## 2020-08-31 DIAGNOSIS — J9611 Chronic respiratory failure with hypoxia: Secondary | ICD-10-CM | POA: Diagnosis not present

## 2020-08-31 DIAGNOSIS — J449 Chronic obstructive pulmonary disease, unspecified: Secondary | ICD-10-CM | POA: Diagnosis not present

## 2020-09-16 DIAGNOSIS — J449 Chronic obstructive pulmonary disease, unspecified: Secondary | ICD-10-CM | POA: Diagnosis not present

## 2020-09-30 DIAGNOSIS — J449 Chronic obstructive pulmonary disease, unspecified: Secondary | ICD-10-CM | POA: Diagnosis not present

## 2020-09-30 DIAGNOSIS — J9611 Chronic respiratory failure with hypoxia: Secondary | ICD-10-CM | POA: Diagnosis not present

## 2020-10-14 DIAGNOSIS — J449 Chronic obstructive pulmonary disease, unspecified: Secondary | ICD-10-CM | POA: Diagnosis not present

## 2020-10-23 ENCOUNTER — Other Ambulatory Visit: Payer: Self-pay

## 2020-10-23 ENCOUNTER — Encounter: Payer: Self-pay | Admitting: Internal Medicine

## 2020-10-23 ENCOUNTER — Ambulatory Visit: Payer: Medicare HMO | Admitting: Internal Medicine

## 2020-10-23 VITALS — BP 120/70 | HR 94 | Temp 97.4°F | Ht 66.0 in | Wt 177.4 lb

## 2020-10-23 DIAGNOSIS — J449 Chronic obstructive pulmonary disease, unspecified: Secondary | ICD-10-CM

## 2020-10-23 DIAGNOSIS — J9611 Chronic respiratory failure with hypoxia: Secondary | ICD-10-CM

## 2020-10-23 DIAGNOSIS — Z23 Encounter for immunization: Secondary | ICD-10-CM

## 2020-10-23 MED ORDER — DOXYCYCLINE HYCLATE 100 MG PO TABS: 100.0000 mg | ORAL_TABLET | Freq: Two times a day (BID) | ORAL | 2 refills | Status: DC

## 2020-10-23 MED ORDER — PREDNISONE 10 MG PO TABS: ORAL_TABLET | ORAL | 2 refills | Status: AC

## 2020-10-23 NOTE — Addendum Note (Signed)
Addended by: Lorretta Harp on: 10/23/2020 01:50 PM   Modules accepted: Orders

## 2020-10-23 NOTE — Addendum Note (Signed)
Addended by: Lorretta Harp on: 10/23/2020 09:51 AM   Modules accepted: Orders

## 2020-10-23 NOTE — Progress Notes (Signed)
07/12/2018 -   Chief Complaint  Patient presents with  . Follow-up    pt states breathing is baseline. c/o occ prod cough with yellowish to white mucus.     HPI Katie Greene 65 y.o. 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  She continues on nocturnal BiPAP.  This is really improved her symptom score.  COPD CAT score is now 14.  She is on triple inhaler therapy and oxygen as well.  #2019 CT scan of the chest did not show any evidence of lung cancer.  Annual screening is recommended.  Her new issues this visit are that she has unintentional weight loss of 12 pounds.  She is frustrated by this.  She has seen GI and is seeing a primary care physician for this.  She is also asking for travel advice encounter.  She is going to [place] for 4 days for the weekend with her friends to celebrate her recent divorce.  She is asking for preemptive antibiotic and prednisone    OV 04/18/2019  Subjective:  Patient ID: Katie Greene, female , DOB: Aug 18, 1955 , age 2 y.o. , MRN: [id number] , ADDRESS: [address] [place] [address]   04/18/2019 -   Chief Complaint  Patient presents with  . Stage 4 very severe COPD by GOLD classification    Breathing is the same as at last visit in December 2019     HPI Katie Greene 65 y.o. -follows up for end-stage Gold stage IV COPD.  Last seen early part of 2020.  After the onset of the pandemic she has hunker down and socially distanced.  She has limited her activities to very low risk.  She goes out to the grocery store with a mask.  She has avoided people.  She continues to be on Arnuity and Stiolto and nighttime BiPAP with Flonase and daily schedule oxygen no new problems.  She just wants refills of doxycycline and prednisone in case of a future exacerbation.  But otherwise doing well.  CAT score shows stability.         OV 06/14/2019  Subjective:  Patient ID: Katie Greene, female , DOB:  Apr 28, 1956 , age 72 y.o. , MRN: [id number] , ADDRESS: [address] [place] [address]   06/14/2019 -   Chief Complaint  Patient presents with  . Follow-up    Pt states she has been doing well since last visit and denies any complaints.   Follow-up stage IV COPD with chronic hypoxemic respiratory failure  HPI Katie Greene 65 y.o. -presents for follow-up.  Last saw her 2 months ago.  She was supposed to come between 3 and 6 months.  However due to some scheduling issues she is back in 2 months.  She has no new symptoms.  COPD CAT score is listed below and stable.  She is on oxygen daytime, Flonase, Arnuity, Stiolto and night BiPAP.  She is doing well.  She had a low-dose CT scan of the chest and findings of benign and a repeat CT scan in 1 year has been recommended.    IMPRESSION: 1. Lung-RADS 2, benign appearance or behavior. Continue annual screening with low-dose chest CT without contrast in 12 months. 2. Aortic Atherosclerosis (ICD10-I70.0) and Emphysema (ICD10-J43.9).   Electronically Signed   By: Misty Stanley M.D.   On: 06/05/2019 11:48 ROS - per HPI   OV 10/11/2019  Subjective:  Patient  ID: Katie Greene, female , DOB: 1955-11-22 , age 25 y.o. , MRN: [id number] , ADDRESS: [address] [place] [address]   10/11/2019 -   Chief Complaint  Patient presents with  . Follow-up    Pt states she has been doing good since last visit and denies any current complaints.     HPI Katie Greene 65 y.o. -presents for COPD follow-up.  Last seen January 2020.  She continues on triple inhaler therapy oxygen at nighttime BiPAP.  Overall stable.  She has had a Covid vaccine.  She is isolating well in social distancing and masking.  Cancer screening: Last CT scan of the chest low-dose CT chest Nov 2020 without any evidence of cancer.  Follow-up recommended in 1 year.  Her advanced COPD makes it difficult to have any successful intervention.  She is agreed to  have her CT chest in 15 months - 18 months which would be in early 2022.  I reviewed and interpreted the result myself.  New issue: On exam she had oral thrush.  This is new.  Last nystatin intake was over a year ago according to her history.  IMPRESSION: 1. Lung-RADS 2, benign appearance or behavior. Continue annual screening with low-dose chest CT without contrast in 12 months. 2. Aortic Atherosclerosis (ICD10-I70.0) and Emphysema (ICD10-J43.9).   Electronically Signed   By: Misty Stanley M.D.   On: 06/05/2019 11:48 ROS - per HPI   OV 01/29/2020  Subjective:  Patient ID: Katie Greene, female , DOB: 1956/05/17 , age 69 y.o. , MRN: [id number] , ADDRESS: [address] [place] [address]   01/29/2020 -   Chief Complaint  Patient presents with  . Follow-up    cough with "little bit of light yellow sputum"     HPI Katie Greene 65 y.o. -returns for follow-up of for her advanced COPD.  She remains on oxygen.  She is on inhaler therapy and nighttime BiPAP.  She tells me that [facility] family pharmacy is shut down and run out of business.  She wants another pharmacy to pick up her albuterol nebulizer.  Other than that in Dec 05, 2019 her stepfather died at age 54s because of renal failure.  She is in grief reaction.  Sometime before that she started losing weight.  Currently weight is 163 pounds.  It is unintentional.  However she thinks it is because of grief reaction.  In addition for the last 1 week she has a little bit of light yellow sputum there is a change in color.  There is no increased wheezing or no increased cough.  Nevertheless she think she will benefit from antibiotics.  She wants to keep prednisone handy.  Last visit we treated her for oral thrush with nystatin swish and swallow.  This visit she still has oral thrush.  She says she rinses her mouth pretty diligently.     OV 04/30/2020   Subjective:  Patient ID: Katie Greene, female , DOB: [birth year], age  75 y.o. years. , MRN: [id number],  ADDRESS: [address] [place] [address] PCP  Leanna Battles, MD Providers : Treatment Team:  Attending Provider: Brand Males, MD   Chief Complaint  Patient presents with  . 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 -returns for routine follow-up.  She is on oxygen 3 L nasal cannula at nighttime BiPAP, Flonase, Arnuity and  Stiolto.  Overall she is feeling well.  Last visit she had persistent thrush.  She took Diflucan.  She thinks she has persistent thrush this time as well but upon close examination there is no more thrush.  Ritta Slot is resolved.  She was having weight loss following death of her sister but this seems to have stabilized weight is 164 pounds and stable.  Her last scans for lung cancer screening was November 2020.  She is coming up 1 year.  She is not having any hemoptysis.  Overall she feels stable.  She is in need of a flu shot and Covid booster vaccine.  She is willing to have these.  She did do a walking desaturation test for [place] on 2 L.  Resting heart rate was 89/min.  Pulse ox was 87% on RA at rest. Pulse ox 99% at rest on 2L.  She walked 1 lap on 2 L continuous and her pulse ox dropped to 81%.  Heart rate was 99/min. AT home use 3L    OV 07/17/2020   Subjective:  Patient ID: Katie Greene, female , DOB: Dec 06, 1955, age 55 y.o. years. , MRN: [id number],  ADDRESS: [place] [address] PCP  Leanna Battles, MD Providers : Treatment Team:  Attending Provider: Brand Males, MD Patient Care Team: Leanna Battles, MD as PCP - General (Internal Medicine)    Chief Complaint  Patient presents with  . Follow-up    Patient wears 3 liters oxygen all the time. Patient fells good overall. Has been started on Nitro       HPI Katie Greene 65 y.o. -returns for routine follow-up of her COPD.  Her grief reaction has improved after the loss of  her sister.  Her weight loss is also improved.  She has gained weight.  COPD stable.  Her triple inhaler therapy isBREZTRI and subjectively she feels this is the best inhaler ever.  She is on an acetylcysteine.  She is on oxygen.  She had a low-dose CT scan of the chest.  Annual screening is recommended.  There is no pulmonary fibrosis no new issues.  Her FEV1 is a few years ago and her DLCO last was several years ago.  Based on both of that she might qualify for a referral for ZEPHRYV valve consideration .  We went over videos of this and discussed this in detail.  She is interested in a referral.  But for this  she needs recent PFT    CAT Score 01/29/2020 10/11/2019 01/02/2019  Total CAT Score 18 25 13       CAT COPD Symptom & Quality of Life Score ([place]) 0 is no burden. 5 is highest burden 12/31/2016  03/09/2017  10/11/2017  01/26/2018  04/28/2018 With nigight bipap 07/12/2018 onging bipap qhs 04/18/2019  06/14/2019   Never Cough -> Cough all the time 3 2 4 3 3 2 2 2   No phlegm in chest -> Chest is full of phlegm 3 2 3 4 2 2 1 2   No chest tightness -> Chest feels very tight 3 3 3 3  0 0 1 1  No dyspnea for 1 flight stairs/hill -> Very dyspneic for 1 flight of stairs 5 5 5 5 4 4 4 4   No limitations for ADL at home -> Very limited with ADL at home 5 3 5 5 5 2 2 2   Confident leaving home -> Not at all confident leaving home 2 5 3 4 3  0  1 2  Sleep soundly -> Do not sleep soundly because of lung condition 2 4 3 4 3 4 3 2   Lots of Energy -> No energy at all 4 3 3 3 4  0 3 3  TOTAL Score (max 40)  27 27 30 31 24 14 17 18     ROS  OV 10/23/2020  Subjective:  Patient ID: Katie Greene, female , DOB: 1956/06/22 , age 29 y.o. , MRN: [id number] , ADDRESS: [place] [address] PCP Leanna Battles, MD Patient Care Team: Leanna Battles, MD as PCP - General (Internal Medicine)  This Provider for this visit: Treatment Team:  Attending Provider: Brand Males,  MD    10/23/2020 -   Chief Complaint  Patient presents with  . Follow-up    Pt states she has been doing okay since last visit and denies any complaints.   Follow-up Gold stage IV COPD with chronic hypoxic respiratory failure -on BREZTRI and TRIOLOGY and NAC Last lung cancer screening CT November 2021 Last ABG August 2018 with PCO2 of 47 2 blood gas in the next few weeks.  HPI Katie Greene 65 y.o. -returns for follow-up.  Last visit she wanted to discuss about Zephyr valve.  Therefore we did repeat pulmonary function test in Sep 03, 2020.  Her FEV1 has declined her FVC has declined her DLCO is declined but still she has acceptable criteria for zephyr valve.  At this visit she tells me she does not want to go through the referral to [facility].  Her uncle passed away in 2020/09/03.  He was the main person driving her around.  She has no transport now.  In addition dealing with grief reaction.  Otherwise stable COPD with  BREZRI and N-acetylcysteine and 3 L of oxygen at rest.  We discussed lung transplantation and she is not interested.  She also has obesity and physically deconditioned and does not have good social support.  Finances can also be an issue.  She is due for her Pneumovax today she is just on 23 years and is been 7 years since her previous Pneumovax.  Review of the records indicate last CT scan of the chest was in November 2021.  Last ABG was in 2018.  The PCO2 at that time was 47.  On Trilogy vet  CT Chest data  No results found.    PFT  PFT Results Latest Ref Rng & Units 08/09/2020 12/12/2014 09/11/2014 06/21/2014 03/26/2014 01/04/2014 12/08/2013  FVC-Pre L 1.44 1.53 1.76 1.69 1.67 1.60 1.53  FVC-Predicted Pre % 49 50 58 55 54 52 50  FVC-Post L 1.51 1.80 1.78 1.69 1.80 1.64 1.82  FVC-Predicted Post % 52 59 58 55 59 53 59  Pre FEV1/FVC % % 42 44 45 48 47 44 47  Post FEV1/FCV % % 41 48 47 46 47 48 45  FEV1-Pre L 0.61 0.68 0.80 0.81 0.78 0.70 0.71  FEV1-Predicted Pre % 26  28 33 33 32 29 29  FEV1-Post L 0.62 0.86 0.83 0.78 0.85 0.78 0.81  DLCO uncorrected ml/min/mmHg 4.97 - - - - - -  DLCO UNC% % 22 - - - - - -  DLCO corrected ml/min/mmHg 4.97 - - - - - -  DLCO COR %Predicted % 22 - - - - - -  DLVA Predicted % 39 - - - - - -  TLC L 4.91 - - - - - -  TLC % Predicted % 88 - - - - - -  RV % Predicted % 134 - - - - - -       has a past medical history of Allergy, Anxiety, Arthritis, Chest pain, Chronic respiratory failure (HCC), COPD (chronic obstructive pulmonary disease) ([place]), Depression, Emphysema of lung (HCC), Fatigue, GERD (gastroesophageal reflux disease), Hemorrhoids, History of colon polyps, Hyperlipidemia, Hypertension, Iron deficiency anemia, On home oxygen therapy, Oxygen deficiency, Palpitations, Pulmonary nodules, Seasonal allergies, Wears dentures, and Wears glasses.   reports that she quit smoking about 8 years ago. Her smoking use included cigarettes. She started smoking about 52 years ago. She has a 12.50 pack-year smoking history. She has never used smokeless tobacco.  Past Surgical History:  Procedure Laterality Date  . CHOLECYSTECTOMY     laparosopic attempted, then had to open  . COLONOSCOPY     x2 with polyps removed  . COLONOSCOPY N/A 08/27/2015   Procedure: COLONOSCOPY;  Surgeon: Manus Gunning, MD;  Location: [facility];  Service: Gastroenterology;  Laterality: N/A;  . COLONOSCOPY WITH PROPOFOL N/A 01/17/2019   Procedure: COLONOSCOPY WITH PROPOFOL;  Surgeon: Yetta Flock, MD;  Location: [facility];  Service: Gastroenterology;  Laterality: N/A;  . oopherectomy     1 ovary removed only  . POLYPECTOMY    . POLYPECTOMY  01/17/2019   Procedure: POLYPECTOMY;  Surgeon: Yetta Flock, MD;  Location: [facility];  Service: Gastroenterology;;  . SALPINGECTOMY     1 fallopian tube removed    Allergies  Allergen Reactions  . Daliresp [Roflumilast] Diarrhea, Nausea And Vomiting and Other (See Comments)    Upset  stomach    Immunization History  Administered Date(s) Administered  . Fluad Quad(high Dose 65+) 03/29/2019, 05/09/2020  . Influenza Split 06/10/2012, 04/03/2014, 04/20/2015  . Influenza, High Dose Seasonal PF 04/09/2018  . Influenza,inj,Quad PF,6+ Mos 04/24/2013, 03/03/2016  . Influenza-Unspecified 05/29/2017  . PFIZER(Purple Top)SARS-COV-2 Vaccination 08/25/2019, 09/15/2019, 05/01/2020  . Pneumococcal Conjugate-13 05/07/2015  . Pneumococcal Polysaccharide-23 06/10/2012  . Zoster 05/10/2014  . Zoster Recombinat (Shingrix) 07/12/2018    Family History  Problem Relation Age of Onset  . Heart attack Mother   . Pancreatic cancer Mother   . Prostate cancer Father   . Hypertension Father   . Throat cancer Father   . Throat cancer Brother   . Prostate cancer Brother   . Colon cancer Sister   . Brain cancer Sister   . Stomach cancer Neg Hx   . Colon polyps Neg Hx   . Rectal cancer Neg Hx   . Esophageal cancer Neg Hx      Current Outpatient Medications:  .  Acetylcysteine (N-ACETYL-L-CYSTEINE) 600 MG CAPS, Take 600 mg by mouth 2 (two) times a day. , Disp: , Rfl:  .  albuterol (PROVENTIL) (2.5 MG/3ML) 0.083% nebulizer solution, Take 3 mLs (2.5 mg total) by nebulization every 4 (four) hours as needed for wheezing or shortness of breath. Dx code j44.9, Disp: 125 mL, Rfl: 6 .  albuterol (VENTOLIN HFA) 108 (90 Base) MCG/ACT inhaler, INHALE 1 TO 2 PUFFS EVERY 6 HOURS AS NEEDED, Disp: 54 g, Rfl: 3 .  ALPRAZolam (XANAX) 0.5 MG tablet, Take 0.5 mg by mouth at bedtime as needed for anxiety or sleep. , Disp: , Rfl:  .  AMBULATORY NON FORMULARY MEDICATION, VENTILATOR machine at night, Disp: , Rfl:  .  aspirin EC 81 MG tablet, Take 81 mg by mouth daily. , Disp: , Rfl:  .  Budeson-Glycopyrrol-Formoterol (BREZTRI AEROSPHERE) 160-9-4.8 MCG/ACT AERO, Inhale 2 puffs into the lungs in the morning and  at bedtime., Disp: 32.1 g, Rfl: 3 .  buPROPion (WELLBUTRIN SR) 150 MG 12 hr tablet, Take 150 mg by  mouth daily., Disp: , Rfl:  .  cetirizine (ZYRTEC) 10 MG tablet, Take 10 mg by mouth daily. , Disp: , Rfl:  .  diltiazem (CARDIZEM CD) 180 MG 24 hr capsule, Take 180 mg by mouth daily. , Disp: , Rfl:  .  ferrous sulfate 325 (65 FE) MG tablet, Take 325 mg by mouth daily with breakfast., Disp: , Rfl:  .  fluticasone (FLONASE) 50 MCG/ACT nasal spray, Place 1 spray into both nostrils at bedtime., Disp: 48 g, Rfl: 3 .  hydrochlorothiazide (HYDRODIURIL) 25 MG tablet, Take 25 mg by mouth daily. , Disp: , Rfl:  .  hydrocortisone 1 % ointment, Pea size amount into rectum twice daily, Disp: 30 g, Rfl: 0 .  metoprolol succinate (TOPROL-XL) 50 MG 24 hr tablet, Take 50 mg by mouth every morning. Take with or immediately following a meal., Disp: , Rfl:  .  nitroGLYCERIN (NITROSTAT) 0.4 MG SL tablet, , Disp: , Rfl:  .  omeprazole (PRILOSEC) 20 MG capsule, Take 20 mg by mouth daily. , Disp: , Rfl:  .  OXYGEN, Inhale 2-3 L into the lungs daily., Disp: , Rfl:  .  pravastatin (PRAVACHOL) 80 MG tablet, Take 80 mg by mouth every morning. , Disp: , Rfl:  .  spironolactone (ALDACTONE) 50 MG tablet, Take 25 mg by mouth daily. , Disp: , Rfl:  .  temazepam (RESTORIL) 30 MG capsule, Take 30 mg by mouth at bedtime as needed for sleep. , Disp: , Rfl: 4 .  VITAMIN D PO, Take 5,000 Units by mouth daily. , Disp: , Rfl:       Objective:   Vitals:   10/23/20 0912  BP: 120/70  Pulse: 94  Temp: (!) 97.4 F (36.3 C)  TempSrc: Temporal  SpO2: 100%  Weight: 177 lb 6.4 oz (80.5 kg)  Height: 5\' 6"  (1.676 m)    Estimated body mass index is 28.63 kg/m as calculated from the following:   Height as of this encounter: 5\' 6"  (1.676 m).   Weight as of this encounter: 177 lb 6.4 oz (80.5 kg).  @WEIGHTCHANGE @  [employer]   10/23/20 0912  Weight: 177 lb 6.4 oz (80.5 kg)     Physical Exam General: No distress. obes Neuro: Alert and Oriented x 3. GCS 15. Speech normal Psych: Pleasant Resp:  Barrel Chest - yes mild.   Wheeze - no, Crackles - no, No overt respiratory distress CVS: Normal heart sounds. Murmurs - no Ext: Stigmata of Connective Tissue Disease - no HEENT: Normal upper airway. PEERL +. No post nasal drip        Assessment:       ICD-10-CM   1. Stage 4 very severe COPD by GOLD classification ([place])  J44.9   2. Chronic respiratory failure with hypoxia (HCC)  J96.11        Plan:     Patient Instructions      ICD-10-CM   1. Stage 4 very severe COPD by GOLD classification ([place])  J44.9   2. Chronic respiratory failure with hypoxia (HCC)  J96.11     COPD stable but you have advanced Gold stage IV COPD and with evidence of decline in lung function over many years  We discussed ZEPHYR valve and lung transplant  -Currently not interested in these options partly because of recent grief reaction after losing her uncle in January 2022  Plan -Continue oxygen as before -3 L with rest and 4-5 L with exertion -Continue triple inhaler therapy with BREZTRI as before -Continue albuterol as needed -Continue N-acetylcysteine for COPD exacerbation prevention -Hold off on zephyr valve referral till next visit - Pneumovax 10/23/2020  - Continue Trilkogy Vent QHS  Follow-up -3 months or sooner if needed     SIGNATURE    Dr. Brand Males, M.D., F.C.C.P,  Pulmonary and Critical Care Medicine Staff Physician, [place] Director - Interstitial Lung Disease  Program  [facility] [place], [place], [address]  Pager: [phone], If no answer or between  15:00h - 7:00h: call [phone] Telephone: [phone]  9:44 AM 10/23/2020

## 2020-10-23 NOTE — Patient Instructions (Addendum)
    ICD-10-CM   1. Stage 4 very severe COPD by GOLD classification ([place])  J44.9   2. Chronic respiratory failure with hypoxia (HCC)  J96.11     COPD stable but you have advanced Gold stage IV COPD and with evidence of decline in lung function over many years  We discussed ZEPHYR valve and lung transplant  -Currently not interested in these options partly because of recent grief reaction after losing her uncle in January 2022  Plan -Continue oxygen as before -3 L with rest and 4-5 L with exertion -Continue triple inhaler therapy with BREZTRI as before -Continue albuterol as needed -Continue N-acetylcysteine for COPD exacerbation prevention -Hold off on zephyr valve referral till next visit - Pneumovax 10/23/2020  - Continue Trilkogy Vent QHS  Follow-up -3 months or sooner if needed

## 2020-10-29 DIAGNOSIS — J9611 Chronic respiratory failure with hypoxia: Secondary | ICD-10-CM | POA: Diagnosis not present

## 2020-10-29 DIAGNOSIS — J449 Chronic obstructive pulmonary disease, unspecified: Secondary | ICD-10-CM | POA: Diagnosis not present

## 2020-11-14 DIAGNOSIS — J449 Chronic obstructive pulmonary disease, unspecified: Secondary | ICD-10-CM | POA: Diagnosis not present

## 2020-11-29 DIAGNOSIS — J9611 Chronic respiratory failure with hypoxia: Secondary | ICD-10-CM | POA: Diagnosis not present

## 2020-11-29 DIAGNOSIS — J449 Chronic obstructive pulmonary disease, unspecified: Secondary | ICD-10-CM | POA: Diagnosis not present

## 2020-12-14 DIAGNOSIS — J449 Chronic obstructive pulmonary disease, unspecified: Secondary | ICD-10-CM | POA: Diagnosis not present

## 2020-12-29 DIAGNOSIS — J449 Chronic obstructive pulmonary disease, unspecified: Secondary | ICD-10-CM | POA: Diagnosis not present

## 2020-12-29 DIAGNOSIS — J9611 Chronic respiratory failure with hypoxia: Secondary | ICD-10-CM | POA: Diagnosis not present

## 2021-01-03 ENCOUNTER — Telehealth: Payer: Self-pay | Admitting: Internal Medicine

## 2021-01-03 NOTE — Telephone Encounter (Signed)
Called and spoke with patient, she states that she has prescription boxes of inhalers that she no longer uses that have not been opened, but are not expired.  She was asking if we can take those from her.  I advised her that we cannot take those mediations, advised she can check with her pharmacy to see if they know a place that take medication donations, I also provided her with the phone number for the [facility] to see if they take medication donations.  She verbalized understanding.  Nothing further needed.

## 2021-01-14 DIAGNOSIS — J449 Chronic obstructive pulmonary disease, unspecified: Secondary | ICD-10-CM | POA: Diagnosis not present

## 2021-01-21 ENCOUNTER — Encounter: Payer: Self-pay | Admitting: Internal Medicine

## 2021-01-21 ENCOUNTER — Other Ambulatory Visit: Payer: Self-pay

## 2021-01-21 ENCOUNTER — Ambulatory Visit: Payer: Medicare HMO | Admitting: Internal Medicine

## 2021-01-21 VITALS — BP 120/64 | HR 101 | Ht 66.0 in | Wt 177.6 lb

## 2021-01-21 DIAGNOSIS — J9611 Chronic respiratory failure with hypoxia: Secondary | ICD-10-CM | POA: Diagnosis not present

## 2021-01-21 DIAGNOSIS — J449 Chronic obstructive pulmonary disease, unspecified: Secondary | ICD-10-CM

## 2021-01-21 NOTE — Patient Instructions (Signed)
ICD-10-CM   1. Stage 4 very severe COPD by GOLD classification ([place])  J44.9     2. Chronic respiratory failure with hypoxia (HCC)  J96.11       Glad overall you are stable without any medical problems.  Plan - Continue Breztri, oxygen, nighttime ventilator and N-acetylcysteine -Respect no interest in valve replacement therapy -Flu shot in the fall -Keep an eye on latest COVID-vaccine requirements  Follow-up - Return in 6 months or sooner if needed

## 2021-01-21 NOTE — Progress Notes (Signed)
07/12/2018 -   Chief Complaint  Patient presents with   Follow-up    pt states breathing is baseline. c/o occ prod cough with yellowish to white mucus.     HPI Katie Greene 65 y.o. 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  She continues on nocturnal BiPAP.  This is really improved her symptom score.  COPD CAT score is now 14.  She is on triple inhaler therapy and oxygen as well.  #2019 CT scan of the chest did not show any evidence of lung cancer.  Annual screening is recommended.  Her new issues this visit are that she has unintentional weight loss of 12 pounds.  She is frustrated by this.  She has seen GI and is seeing a primary care physician for this.  She is also asking for travel advice encounter.  She is going to [place] for 4 days for the weekend with her friends to celebrate her recent divorce.  She is asking for preemptive antibiotic and prednisone    OV 04/18/2019  Subjective:  Patient ID: Katie Greene, female , DOB: 04-26-1956 , age 65 y.o. , MRN: [id number] , ADDRESS: [address] [place] [address]   04/18/2019 -   Chief Complaint  Patient presents with   Stage 4 very severe COPD by GOLD classification    Breathing is the same as at last visit in December 2019     HPI Katie Greene 65 y.o. -follows up for end-stage Gold stage IV COPD.  Last seen early part of 2020.  After the onset of the pandemic she has hunker down and socially distanced.  She has limited her activities to very low risk.  She goes out to the grocery store with a mask.  She has avoided people.  She continues to be on Arnuity and Stiolto and nighttime BiPAP with Flonase and daily schedule oxygen no new problems.  She just wants refills of doxycycline and prednisone in case of a future exacerbation.  But otherwise doing well.  CAT score shows stability.         OV 06/14/2019  Subjective:  Patient ID: Katie Greene, female , DOB:  [birth year] , age 44 y.o. , MRN: [id number] , ADDRESS: [address] [place] [address]   06/14/2019 -   Chief Complaint  Patient presents with   Follow-up    Pt states she has been doing well since last visit and denies any complaints.   Follow-up stage IV COPD with chronic hypoxemic respiratory failure  HPI Katie Greene 65 y.o. -presents for follow-up.  Last saw her 2 months ago.  She was supposed to come between 3 and 6 months.  However due to some scheduling issues she is back in 2 months.  She has no new symptoms.  COPD CAT score is listed below and stable.  She is on oxygen daytime, Flonase, Arnuity, Stiolto and night BiPAP.  She is doing well.  She had a low-dose CT scan of the chest and findings of benign and a repeat CT scan in 1 year has been recommended.    IMPRESSION: 1. Lung-RADS 2, benign appearance or behavior. Continue annual screening with low-dose chest CT without contrast in 12 months. 2. Aortic Atherosclerosis (ICD10-I70.0) and Emphysema (ICD10-J43.9).     Electronically Signed   By: Katie Greene M.D.   On: 06/05/2019 11:48 ROS - per HPI   OV 10/11/2019  Subjective:  Patient ID: Katie Greene, female , DOB: 04/23/1956 , age 46 y.o. , MRN: [id number] , ADDRESS: [address] [place] [address]   10/11/2019 -   Chief Complaint  Patient presents with   Follow-up    Pt states she has been doing good since last visit and denies any current complaints.     HPI Katie Greene 65 y.o. -presents for COPD follow-up.  Last seen January 2020.  She continues on triple inhaler therapy oxygen at nighttime BiPAP.  Overall stable.  She has had a Covid vaccine.  She is isolating well in social distancing and masking.  Cancer screening: Last CT scan of the chest low-dose CT chest Nov 2020 without any evidence of cancer.  Follow-up recommended in 1 year.  Her advanced COPD makes it difficult to have any successful intervention.  She is agreed to have  her CT chest in 15 months - 18 months which would be in early 2022.  I reviewed and interpreted the result myself.  New issue: On exam she had oral thrush.  This is new.  Last nystatin intake was over a year ago according to her history.  IMPRESSION: 1. Lung-RADS 2, benign appearance or behavior. Continue annual screening with low-dose chest CT without contrast in 12 months. 2. Aortic Atherosclerosis (ICD10-I70.0) and Emphysema (ICD10-J43.9).     Electronically Signed   By: Katie Greene M.D.   On: 06/05/2019 11:48 ROS - per HPI   OV 01/29/2020  Subjective:  Patient ID: Katie Greene, female , DOB: 09/16/1955 , age 89 y.o. , MRN: [id number] , ADDRESS: [address] [place] [address]   01/29/2020 -   Chief Complaint  Patient presents with   Follow-up    cough with "little bit of light yellow sputum"     HPI Katie Greene 65 y.o. -returns for follow-up of for her advanced COPD.  She remains on oxygen.  She is on inhaler therapy and nighttime BiPAP.  She tells me that [facility] pharmacy is shut down and run out of business.  She wants another pharmacy to pick up her albuterol nebulizer.  Other than that in 11/29/2019 her stepfather died at age 11s because of renal failure.  She is in grief reaction.  Sometime before that she started losing weight.  Currently weight is 163 pounds.  It is unintentional.  However she thinks it is because of grief reaction.  In addition for the last 1 week she has a little bit of light yellow sputum there is a change in color.  There is no increased wheezing or no increased cough.  Nevertheless she think she will benefit from antibiotics.  She wants to keep prednisone handy.  Last visit we treated her for oral thrush with nystatin swish and swallow.  This visit she still has oral thrush.  She says she rinses her mouth pretty diligently.     OV 04/30/2020   Subjective:  Patient ID: Katie Greene, female , DOB: June 29, 1956, age 57  y.o. years. 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 -returns for routine follow-up.  She is on oxygen 3 L nasal cannula at nighttime BiPAP,  Flonase, Arnuity and Stiolto.  Overall she is feeling well.  Last visit she had persistent thrush.  She took Diflucan.  She thinks she has persistent thrush this time as well but upon close examination there is no more thrush.  Ritta Slot is resolved.  She was having weight loss following death of her sister but this seems to have stabilized weight is 164 pounds and stable.  Her last scans for lung cancer screening was November 2020.  She is coming up 1 year.  She is not having any hemoptysis.  Overall she feels stable.  She is in need of a flu shot and Covid booster vaccine.  She is willing to have these.  She did do a walking desaturation test for [place] on 2 L.  Resting heart rate was 89/min.  Pulse ox was 87% on RA at rest. Pulse ox 99% at rest on 2L.  She walked 1 lap on 2 L continuous and her pulse ox dropped to 81%.  Heart rate was 99/min. AT home use 3L    OV 07/17/2020   Subjective:  Patient ID: Katie Greene, female , DOB: 07-Apr-1956, age 56 y.o. years. , MRN: [id number],  ADDRESS: [place] [address] PCP  Katie Battles, MD Providers : Treatment Team:  Attending Provider: Brand Males, MD Patient Care Team: Katie Battles, MD as PCP - General (Internal Medicine)    Chief Complaint  Patient presents with   Follow-up    Patient wears 3 liters oxygen all the time. Patient fells good overall. Has been started on Nitro       HPI Katie Greene 65 y.o. -returns for routine follow-up of her COPD.  Her grief reaction has improved after the loss of her  sister.  Her weight loss is also improved.  She has gained weight.  COPD stable.  Her triple inhaler therapy isBREZTRI and subjectively she feels this is the best inhaler ever.  She is on an acetylcysteine.  She is on oxygen.  She had a low-dose CT scan of the chest.  Annual screening is recommended.  There is no pulmonary fibrosis no new issues.  Her FEV1 is a few years ago and her DLCO last was several years ago.  Based on both of that she might qualify for a referral for ZEPHRYV valve consideration .  We went over videos of this and discussed this in detail.  She is interested in a referral.  But for this  she needs recent PFT    CAT Score 01/29/2020 10/11/2019 01/02/2019  Total CAT Score 18 25 13       CAT COPD Symptom & Quality of Life Score ([place]) 0 is no burden. 5 is highest burden 12/31/2016  03/09/2017  10/11/2017  01/26/2018  04/28/2018 With nigight bipap 07/12/2018 onging bipap qhs 04/18/2019  06/14/2019   Never Cough -> Cough all the time 3 2 4 3 3 2 2 2   No phlegm in chest -> Chest is full of phlegm 3 2 3 4 2 2 1 2   No chest tightness -> Chest feels very tight 3 3 3 3  0 0 1 1  No dyspnea for 1 flight stairs/hill -> Very dyspneic for 1 flight of stairs 5 5 5 5 4 4 4 4   No limitations for ADL at home -> Very limited with ADL at home 5 3 5 5 5 2 2 2   Confident leaving home -> Not at all confident leaving home 2 5 3  4 3 0 1 2  Sleep soundly -> Do not sleep soundly because of lung condition 2 4 3 4 3 4 3 2   Lots of Energy -> No energy at all 4 3 3 3 4  0 3 3  TOTAL Score (max 40)  27 27 30 31 24 14 17 18     ROS  OV 10/23/2020  Subjective:  Patient ID: Katie Greene, female , DOB: 09-Oct-1955 , age 82 y.o. , MRN: [id number] , ADDRESS: [place] [address] PCP Katie Battles, MD Patient Care Team: Katie Battles, MD as PCP - General (Internal Medicine)  This Provider for this visit: Treatment Team:  Attending Provider: Brand Males,  MD    10/23/2020 -   Chief Complaint  Patient presents with   Follow-up    Pt states she has been doing okay since last visit and denies any complaints.   Follow-up Gold stage IV COPD with chronic hypoxic respiratory failure -on BREZTRI and TRIOLOGY and NAC Last lung cancer screening CT November 2021 Last ABG August 2018 with PCO2 of 47 2 blood gas in the next few weeks.  HPI Katie Greene 65 y.o. -returns for follow-up.  Last visit she wanted to discuss about Zephyr valve.  Therefore we did repeat pulmonary function test in 09/10/2020.  Her FEV1 has declined her FVC has declined her DLCO is declined but still she has acceptable criteria for zephyr valve.  At this visit she tells me she does not want to go through the referral to [facility].  Her uncle passed away in 2020/09/10.  He was the main person driving her around.  She has no transport now.  In addition dealing with grief reaction.  Otherwise stable COPD with  BREZRI and N-acetylcysteine and 3 L of oxygen at rest.  We discussed lung transplantation and she is not interested.  She also has obesity and physically deconditioned and does not have good social support.  Finances can also be an issue.  She is due for her Pneumovax today she is just on 15 years and is been 7 years since her previous Pneumovax.  Review of the records indicate last CT scan of the chest was in November 2021.  Last ABG was in 2018.  The PCO2 at that time was 47.  On Trilogy vet  CT Chest data  No results found.     has a past medical history of Allergy, Anxiety, Arthritis, Chest pain, Chronic respiratory failure (HCC), COPD (chronic obstructive pulmonary disease) ([place]), Depression, Emphysema of lung (HCC), Fatigue, GERD (gastroesophageal reflux disease), Hemorrhoids, History of colon polyps, Hyperlipidemia, Hypertension, Iron deficiency anemia, On home oxygen therapy, Oxygen deficiency, Palpitations, Pulmonary nodules, Seasonal allergies, Wears dentures,  and Wears glasses.   OV 01/21/2021  Subjective:  Patient ID: Katie Greene, female , DOB: May 21, 1956 , age 53 y.o. , MRN: [id number] , ADDRESS: [place] [address] PCP Katie Battles, MD Patient Care Team: Katie Battles, MD as PCP - General (Internal Medicine)  This Provider for this visit: Treatment Team:  Attending Provider: Brand Males, MD    01/21/2021 -   Chief Complaint  Patient presents with   Follow-up    Pt states she has been doing okay since last visit and denies any real complaints.   Follow-up Gold stage IV COPD with chronic hypoxic respiratory failure -on BREZTRI and TRIOLOGY and NAC Last lung cancer screening CT November 2021 Last ABG August 2018  with PCO2 of 47 2 blood gas in the next few weeks.  HPI Katie Greene 65 y.o. -returns for her 41-month follow-up.  Overall she is doing well.  No new complaints.  Symptoms are stable.  She is planning a family reunion in [place].  No flareups.  No new issues no medical problems.  As before she is not interested in valve replacement therapy for her COPD    CT Chest data  No results found.    PFT  PFT Results Latest Ref Rng & Units 08/09/2020 12/12/2014 09/11/2014 06/21/2014 03/26/2014 01/04/2014 12/08/2013  FVC-Pre L 1.44 1.53 1.76 1.69 1.67 1.60 1.53  FVC-Predicted Pre % 49 50 58 55 54 52 50  FVC-Post L 1.51 1.80 1.78 1.69 1.80 1.64 1.82  FVC-Predicted Post % 52 59 58 55 59 53 59  Pre FEV1/FVC % % 42 44 45 48 47 44 47  Post FEV1/FCV % % 41 48 47 46 47 48 45  FEV1-Pre L 0.61 0.68 0.80 0.81 0.78 0.70 0.71  FEV1-Predicted Pre % 26 28 33 33 32 29 29  FEV1-Post L 0.62 0.86 0.83 0.78 0.85 0.78 0.81  DLCO uncorrected ml/min/mmHg 4.97 - - - - - -  DLCO UNC% % 22 - - - - - -  DLCO corrected ml/min/mmHg 4.97 - - - - - -  DLCO COR %Predicted % 22 - - - - - -  DLVA Predicted % 39 - - - - - -  TLC L 4.91 - - - - - -  TLC % Predicted % 88 - - - - - -  RV % Predicted % 134 - - - - - -        has a past medical history of Allergy, Anxiety, Arthritis, Chest pain, Chronic respiratory failure (HCC), COPD (chronic obstructive pulmonary disease) ([place]), Depression, Emphysema of lung (HCC), Fatigue, GERD (gastroesophageal reflux disease), Hemorrhoids, History of colon polyps, Hyperlipidemia, Hypertension, Iron deficiency anemia, On home oxygen therapy, Oxygen deficiency, Palpitations, Pulmonary nodules, Seasonal allergies, Wears dentures, and Wears glasses.   reports that she quit smoking about 8 years ago. Her smoking use included cigarettes. She started smoking about 52 years ago. She has a 12.50 pack-year smoking history. She has never used smokeless tobacco.  Past Surgical History:  Procedure Laterality Date   CHOLECYSTECTOMY     laparosopic attempted, then had to open   COLONOSCOPY     x2 with polyps removed   COLONOSCOPY N/A 08/27/2015   Procedure: COLONOSCOPY;  Surgeon: Manus Gunning, MD;  Location: [facility];  Service: Gastroenterology;  Laterality: N/A;   COLONOSCOPY WITH PROPOFOL N/A 01/17/2019   Procedure: COLONOSCOPY WITH PROPOFOL;  Surgeon: Yetta Flock, MD;  Location: [facility];  Service: Gastroenterology;  Laterality: N/A;   oopherectomy     1 ovary removed only   POLYPECTOMY     POLYPECTOMY  01/17/2019   Procedure: POLYPECTOMY;  Surgeon: Yetta Flock, MD;  Location: [facility];  Service: Gastroenterology;;   SALPINGECTOMY     1 fallopian tube removed    Allergies  Allergen Reactions   Daliresp [Roflumilast] Diarrhea, Nausea And Vomiting and Other (See Comments)    Upset stomach    Immunization History  Administered Date(s) Administered   Fluad Quad(high Dose 65+) 03/29/2019, 05/09/2020   Influenza Split 06/10/2012, 04/03/2014, 04/20/2015   Influenza, High Dose Seasonal PF 04/09/2018   Influenza,inj,Quad PF,6+ Mos 04/24/2013, 03/03/2016   Influenza-Unspecified 05/29/2017   PFIZER(Purple Top)SARS-COV-2 Vaccination  08/25/2019, 09/15/2019, 05/01/2020  Pneumococcal Conjugate-13 05/07/2015   Pneumococcal Polysaccharide-23 06/10/2012, 10/23/2020   Zoster Recombinat (Shingrix) 07/12/2018   Zoster, Live 05/10/2014    Family History  Problem Relation Age of Onset   Heart attack Mother    Pancreatic cancer Mother    Prostate cancer Father    Hypertension Father    Throat cancer Father    Throat cancer Brother    Prostate cancer Brother    Colon cancer Sister    Brain cancer Sister    Stomach cancer Neg Hx    Colon polyps Neg Hx    Rectal cancer Neg Hx    Esophageal cancer Neg Hx      Current Outpatient Medications:    Acetylcysteine (N-ACETYL-L-CYSTEINE) 600 MG CAPS, Take 600 mg by mouth 2 (two) times a day. , Disp: , Rfl:    albuterol (PROVENTIL) (2.5 MG/3ML) 0.083% nebulizer solution, Take 3 mLs (2.5 mg total) by nebulization every 4 (four) hours as needed for wheezing or shortness of breath. Dx code j50.9, Disp: 125 mL, Rfl: 6   albuterol (VENTOLIN HFA) 108 (90 Base) MCG/ACT inhaler, INHALE 1 TO 2 PUFFS EVERY 6 HOURS AS NEEDED, Disp: 54 g, Rfl: 3   ALPRAZolam (XANAX) 0.5 MG tablet, Take 0.5 mg by mouth at bedtime as needed for anxiety or sleep. , Disp: , Rfl:    AMBULATORY NON FORMULARY MEDICATION, VENTILATOR machine at night, Disp: , Rfl:    aspirin EC 81 MG tablet, Take 81 mg by mouth daily. , Disp: , Rfl:    Budeson-Glycopyrrol-Formoterol (BREZTRI AEROSPHERE) 160-9-4.8 MCG/ACT AERO, Inhale 2 puffs into the lungs in the morning and at bedtime., Disp: 32.1 g, Rfl: 3   buPROPion (WELLBUTRIN SR) 150 MG 12 hr tablet, Take 150 mg by mouth daily., Disp: , Rfl:    cetirizine (ZYRTEC) 10 MG tablet, Take 10 mg by mouth daily. , Disp: , Rfl:    diltiazem (CARDIZEM CD) 180 MG 24 hr capsule, Take 180 mg by mouth daily. , Disp: , Rfl:    ferrous sulfate 325 (65 FE) MG tablet, Take 325 mg by mouth daily with breakfast., Disp: , Rfl:    fluticasone (FLONASE) 50 MCG/ACT nasal spray, Place 1 spray into both  nostrils at bedtime., Disp: 48 g, Rfl: 3   hydrochlorothiazide (HYDRODIURIL) 25 MG tablet, Take 25 mg by mouth daily. , Disp: , Rfl:    metoprolol succinate (TOPROL-XL) 50 MG 24 hr tablet, Take 50 mg by mouth every morning. Take with or immediately following a meal., Disp: , Rfl:    nitroGLYCERIN (NITROSTAT) 0.4 MG SL tablet, , Disp: , Rfl:    omeprazole (PRILOSEC) 20 MG capsule, Take 20 mg by mouth daily. , Disp: , Rfl:    OXYGEN, Inhale 2-3 L into the lungs daily., Disp: , Rfl:    pravastatin (PRAVACHOL) 80 MG tablet, Take 80 mg by mouth every morning. , Disp: , Rfl:    spironolactone (ALDACTONE) 50 MG tablet, Take 25 mg by mouth daily. , Disp: , Rfl:    temazepam (RESTORIL) 30 MG capsule, Take 30 mg by mouth at bedtime as needed for sleep. , Disp: , Rfl: 4   VITAMIN D PO, Take 5,000 Units by mouth daily. , Disp: , Rfl:    doxycycline (VIBRA-TABS) 100 MG tablet, Take 1 tablet (100 mg total) by mouth 2 (two) times daily. (Patient not taking: Reported on 01/21/2021), Disp: 10 tablet, Rfl: 2      Objective:   Vitals:   01/21/21 0942  BP:  120/64  Pulse: (!) 101  SpO2: 99%  Weight: 177 lb 9.6 oz (80.6 kg)  Height: 5\' 6"  (1.676 m)    Estimated body mass index is 28.67 kg/m as calculated from the following:   Height as of this encounter: 5\' 6"  (1.676 m).   Weight as of this encounter: 177 lb 9.6 oz (80.6 kg).  @WEIGHTCHANGE @  [employer]   01/21/21 0942  Weight: 177 lb 9.6 oz (80.6 kg)     Physical Exam General: No distress. Looks well Neuro: Alert and Oriented x 3. GCS 15. Speech normal Psych: Pleasant Resp:  Barrel Chest - yes.  Wheeze - no, Crackles - no, No overt respiratory distress CVS: Normal heart sounds. Murmurs - no Ext: Stigmata of Connective Tissue Disease - no HEENT: Normal upper airway. PEERL +. No post nasal drip        Assessment:       ICD-10-CM   1. Stage 4 very severe COPD by GOLD classification ([place])  J44.9     2. Chronic respiratory failure with  hypoxia (HCC)  J96.11          Plan:     Patient Instructions     ICD-10-CM   1. Stage 4 very severe COPD by GOLD classification ([place])  J44.9     2. Chronic respiratory failure with hypoxia (HCC)  J96.11       Glad overall you are stable without any medical problems.  Plan - Continue Breztri, oxygen, nighttime ventilator and N-acetylcysteine -Respect no interest in valve replacement therapy -Flu shot in the fall -Keep an eye on latest COVID-vaccine requirements  Follow-up - Return in 6 months or sooner if needed    SIGNATURE    Dr. Brand Greene, M.D., F.C.C.P,  Pulmonary and Critical Care Medicine Staff Physician, [place] Director - Interstitial Lung Disease  Program  [facility] [place] [facility] [place], [place], [address]  Pager: [phone], If no answer or between  15:00h - 7:00h: call [phone] Telephone: [phone]  10:35 AM 01/21/2021

## 2021-01-27 ENCOUNTER — Other Ambulatory Visit: Payer: Self-pay | Admitting: Internal Medicine

## 2021-01-29 DIAGNOSIS — J9611 Chronic respiratory failure with hypoxia: Secondary | ICD-10-CM | POA: Diagnosis not present

## 2021-01-29 DIAGNOSIS — J449 Chronic obstructive pulmonary disease, unspecified: Secondary | ICD-10-CM | POA: Diagnosis not present

## 2021-02-07 DIAGNOSIS — J449 Chronic obstructive pulmonary disease, unspecified: Secondary | ICD-10-CM | POA: Diagnosis not present

## 2021-02-13 DIAGNOSIS — J449 Chronic obstructive pulmonary disease, unspecified: Secondary | ICD-10-CM | POA: Diagnosis not present

## 2021-02-24 ENCOUNTER — Other Ambulatory Visit: Payer: Self-pay | Admitting: Internal Medicine

## 2021-02-28 DIAGNOSIS — J9611 Chronic respiratory failure with hypoxia: Secondary | ICD-10-CM | POA: Diagnosis not present

## 2021-02-28 DIAGNOSIS — J449 Chronic obstructive pulmonary disease, unspecified: Secondary | ICD-10-CM | POA: Diagnosis not present

## 2021-03-16 DIAGNOSIS — J449 Chronic obstructive pulmonary disease, unspecified: Secondary | ICD-10-CM | POA: Diagnosis not present

## 2021-03-21 ENCOUNTER — Telehealth: Payer: Self-pay | Admitting: Internal Medicine

## 2021-03-21 MED ORDER — PREDNISONE 10 MG PO TABS: ORAL_TABLET | ORAL | 0 refills | Status: DC

## 2021-03-21 MED ORDER — LEVOFLOXACIN 500 MG PO TABS: 500.0000 mg | ORAL_TABLET | Freq: Every day | ORAL | 0 refills | Status: DC

## 2021-03-21 NOTE — Telephone Encounter (Signed)
ATC patient, LMTCB 

## 2021-03-21 NOTE — Telephone Encounter (Signed)
Call made to patient, confirmed DOB. Patient reports increased SOB and cough with thick green mucous. She feels tight in her chest. She reports increased congestion and runny nose. Denies fever, chills, sweat, or body aches. She has taken the doxycyline that MR gave her to have on hand and she is taking the last dose today. She does not feel it has helped much. She feels like her mucous production has gotten worse. She has taken 2 in home covid test and both were negative. She confirms she is using her Breztri daily and nebulizer 2x/day. She denies any oxygen desaturations. She does reports being SOB with minimal activity. She reports recently her rent was increased significantly and she had to move at the last minute so she has been under a lot of stress and she thinks this has taken a huge tole on her immune system.   MR please advise. Pt aware there will be a delayed response as she is aware you come in this afternoon and states she can wait until then. Thanks :)

## 2021-03-21 NOTE — Telephone Encounter (Signed)
Spoke with patient to let her know the recommendations from Dr. Chase Caller. She verified preferred pharmacy. RX has been sent. Advised her to try and get PCR test and let us know results. She expressed understanding. Nothing further needed at this time.

## 2021-03-21 NOTE — Telephone Encounter (Signed)
Pt stated that she currently has a cough and stated that she has a lot of drainage from her nose into her throat she said it is mucus. Stated she is coughing up the mucus as well; normally she said it is a yellow/greenish color. Pt stated that she has been experiencing more SOB. Pt stated that she has taken at home Covid-19 test and they were negative.   Ph[facility]acy; [facility]; [address] [place], [place] [address]  Pls regard; [phone]

## 2021-03-21 NOTE — Telephone Encounter (Signed)
    she should try to get a COVID PCR test instead of the antigen test   Change antibiotic -  take levaquin '500mg'$  once daily  X 6 days [watch for tendon inflammation] - Take prednisone 40 mg daily x 2 days, then '20mg'$  daily x 2 days, then '10mg'$  daily x 2 days, then '5mg'$  daily x 2 days and stop/go to baseline dose if on chronic steroids    Allergies  Allergen Reactions   Daliresp [Roflumilast] Diarrhea, Nausea And Vomiting and Other (See Comments)    Upset stomach

## 2021-03-22 DIAGNOSIS — Z20822 Contact with and (suspected) exposure to covid-19: Secondary | ICD-10-CM | POA: Diagnosis not present

## 2021-03-26 DIAGNOSIS — Z1231 Encounter for screening mammogram for malignant neoplasm of breast: Secondary | ICD-10-CM | POA: Diagnosis not present

## 2021-03-31 DIAGNOSIS — J9611 Chronic respiratory failure with hypoxia: Secondary | ICD-10-CM | POA: Diagnosis not present

## 2021-03-31 DIAGNOSIS — J449 Chronic obstructive pulmonary disease, unspecified: Secondary | ICD-10-CM | POA: Diagnosis not present

## 2021-04-16 DIAGNOSIS — J449 Chronic obstructive pulmonary disease, unspecified: Secondary | ICD-10-CM | POA: Diagnosis not present

## 2021-05-01 DIAGNOSIS — J449 Chronic obstructive pulmonary disease, unspecified: Secondary | ICD-10-CM | POA: Diagnosis not present

## 2021-05-01 DIAGNOSIS — J9611 Chronic respiratory failure with hypoxia: Secondary | ICD-10-CM | POA: Diagnosis not present

## 2021-05-10 DIAGNOSIS — Z23 Encounter for immunization: Secondary | ICD-10-CM | POA: Diagnosis not present

## 2021-05-16 DIAGNOSIS — J449 Chronic obstructive pulmonary disease, unspecified: Secondary | ICD-10-CM | POA: Diagnosis not present

## 2021-05-27 DIAGNOSIS — M25561 Pain in right knee: Secondary | ICD-10-CM | POA: Diagnosis not present

## 2021-05-27 DIAGNOSIS — M79675 Pain in left toe(s): Secondary | ICD-10-CM | POA: Diagnosis not present

## 2021-05-27 DIAGNOSIS — M25562 Pain in left knee: Secondary | ICD-10-CM | POA: Diagnosis not present

## 2021-05-27 DIAGNOSIS — W19XXXA Unspecified fall, initial encounter: Secondary | ICD-10-CM | POA: Diagnosis not present

## 2021-05-27 DIAGNOSIS — M79672 Pain in left foot: Secondary | ICD-10-CM | POA: Diagnosis not present

## 2021-05-31 DIAGNOSIS — J449 Chronic obstructive pulmonary disease, unspecified: Secondary | ICD-10-CM | POA: Diagnosis not present

## 2021-05-31 DIAGNOSIS — J9611 Chronic respiratory failure with hypoxia: Secondary | ICD-10-CM | POA: Diagnosis not present

## 2021-06-17 ENCOUNTER — Ambulatory Visit (INDEPENDENT_AMBULATORY_CARE_PROVIDER_SITE_OTHER)
Admission: RE | Admit: 2021-06-17 | Discharge: 2021-06-17 | Disposition: A | Discharge status: Home / Self Care | Payer: Medicare HMO | Source: Ambulatory Visit | Attending: Internal Medicine | Admitting: Internal Medicine

## 2021-06-17 ENCOUNTER — Other Ambulatory Visit: Payer: Self-pay

## 2021-06-17 DIAGNOSIS — Z87891 Personal history of nicotine dependence: Secondary | ICD-10-CM | POA: Diagnosis not present

## 2021-06-24 ENCOUNTER — Other Ambulatory Visit: Payer: Self-pay | Admitting: Acute Care

## 2021-06-24 DIAGNOSIS — Z87891 Personal history of nicotine dependence: Secondary | ICD-10-CM

## 2021-07-15 ENCOUNTER — Ambulatory Visit: Payer: Medicare HMO | Admitting: Internal Medicine

## 2021-07-15 ENCOUNTER — Other Ambulatory Visit: Payer: Self-pay | Admitting: *Deleted

## 2021-07-15 ENCOUNTER — Other Ambulatory Visit: Payer: Self-pay

## 2021-07-15 VITALS — BP 108/68 | HR 84 | Temp 98.3°F | Ht 66.0 in | Wt 167.0 lb

## 2021-07-15 DIAGNOSIS — J449 Chronic obstructive pulmonary disease, unspecified: Secondary | ICD-10-CM | POA: Diagnosis not present

## 2021-07-15 DIAGNOSIS — J9611 Chronic respiratory failure with hypoxia: Secondary | ICD-10-CM

## 2021-07-15 MED ORDER — PREDNISONE 10 MG PO TABS: ORAL_TABLET | ORAL | 1 refills | Status: AC

## 2021-07-15 MED ORDER — LEVOFLOXACIN 500 MG PO TABS: 500.0000 mg | ORAL_TABLET | Freq: Every day | ORAL | 1 refills | Status: AC

## 2021-07-15 NOTE — Progress Notes (Signed)
07/12/2018 -   Chief Complaint  Patient presents with   Follow-up    pt states breathing is baseline. c/o occ prod cough with yellowish to white mucus.     HPI Katie Greene 65 y.o. 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  She continues on nocturnal BiPAP.  This is really improved her symptom score.  COPD CAT score is now 14.  She is on triple inhaler therapy and oxygen as well.  #2019 CT scan of the chest did not show any evidence of lung cancer.  Annual screening is recommended.  Her new issues this visit are that she has unintentional weight loss of 12 pounds.  She is frustrated by this.  She has seen GI and is seeing a primary care physician for this.  She is also asking for travel advice encounter.  She is going to [place] for 4 days for the weekend with her friends to celebrate her recent divorce.  She is asking for preemptive antibiotic and prednisone    OV 04/18/2019  Subjective:  Patient ID: Katie Greene, female , DOB: Jun 09, 1956 , age 49 y.o. , MRN: [id number] , ADDRESS: [address] [place] [address]   04/18/2019 -   Chief Complaint  Patient presents with   Stage 4 very severe COPD by GOLD classification    Breathing is the same as at last visit in December 2019     HPI Katie Greene 65 y.o. -follows up for end-stage Gold stage IV COPD.  Last seen early part of 2020.  After the onset of the pandemic she has hunker down and socially distanced.  She has limited her activities to very low risk.  She goes out to the grocery store with a mask.  She has avoided people.  She continues to be on Arnuity and Stiolto and nighttime BiPAP with Flonase and daily schedule oxygen no new problems.  She just wants refills of doxycycline and prednisone in case of a future exacerbation.  But otherwise doing well.  CAT score shows stability.         OV 06/14/2019  Subjective:  Patient ID: Katie Greene, female , DOB:  1955/09/16 , age 77 y.o. , MRN: [id number] , ADDRESS: [address] [place] [address]   06/14/2019 -   Chief Complaint  Patient presents with   Follow-up    Pt states she has been doing well since last visit and denies any complaints.   Follow-up stage IV COPD with chronic hypoxemic respiratory failure  HPI Katie Greene 65 y.o. -presents for follow-up.  Last saw her 2 months ago.  She was supposed to come between 3 and 6 months.  However due to some scheduling issues she is back in 2 months.  She has no new symptoms.  COPD CAT score is listed below and stable.  She is on oxygen daytime, Flonase, Arnuity, Stiolto and night BiPAP.  She is doing well.  She had a low-dose CT scan of the chest and findings of benign and a repeat CT scan in 1 year has been recommended.    IMPRESSION: 1. Lung-RADS 2, benign appearance or behavior. Continue annual screening with low-dose chest CT without contrast in 12 months. 2. Aortic Atherosclerosis (ICD10-I70.0) and Emphysema (ICD10-J43.9).     Electronically Signed   By: Misty Stanley M.D.   On: 06/05/2019 11:48 ROS - per HPI   OV 10/11/2019  Subjective:  Patient ID: Katie Greene, female , DOB: June 07, 1956 , age 19 y.o. , MRN: [id number] , ADDRESS: [address] [place] [address]   10/11/2019 -   Chief Complaint  Patient presents with   Follow-up    Pt states she has been doing good since last visit and denies any current complaints.     HPI Katie Greene 65 y.o. -presents for COPD follow-up.  Last seen January 2020.  She continues on triple inhaler therapy oxygen at nighttime BiPAP.  Overall stable.  She has had a Covid vaccine.  She is isolating well in social distancing and masking.  Cancer screening: Last CT scan of the chest low-dose CT chest Nov 2020 without any evidence of cancer.  Follow-up recommended in 1 year.  Her advanced COPD makes it difficult to have any successful intervention.  She is agreed to have  her CT chest in 15 months - 18 months which would be in early 2022.  I reviewed and interpreted the result myself.  New issue: On exam she had oral thrush.  This is new.  Last nystatin intake was over a year ago according to her history.  IMPRESSION: 1. Lung-RADS 2, benign appearance or behavior. Continue annual screening with low-dose chest CT without contrast in 12 months. 2. Aortic Atherosclerosis (ICD10-I70.0) and Emphysema (ICD10-J43.9).     Electronically Signed   By: Misty Stanley M.D.   On: 06/05/2019 11:48 ROS - per HPI   OV 01/29/2020  Subjective:  Patient ID: Katie Greene, female , DOB: October 26, 1955 , age 71 y.o. , MRN: [id number] , ADDRESS: [address] [place] [address]   01/29/2020 -   Chief Complaint  Patient presents with   Follow-up    cough with "little bit of light yellow sputum"     HPI [name] 65 y.o. -returns for follow-up of for her advanced COPD.  She remains on oxygen.  She is on inhaler therapy and nighttime BiPAP.  She tells me that [facility] pharmacy is shut down and run out of business.  She wants another pharmacy to pick up her albuterol nebulizer.  Other than that in 12/05/2019 her stepfather died at age 90s because of renal failure.  She is in grief reaction.  Sometime before that she started losing weight.  Currently weight is 163 pounds.  It is unintentional.  However she thinks it is because of grief reaction.  In addition for the last 1 week she has a little bit of light yellow sputum there is a change in color.  There is no increased wheezing or no increased cough.  Nevertheless she think she will benefit from antibiotics.  She wants to keep prednisone handy.  Last visit we treated her for oral thrush with nystatin swish and swallow.  This visit she still has oral thrush.  She says she rinses her mouth pretty diligently.     OV 04/30/2020   Subjective:  Patient ID: Katie Greene, female , DOB: May 17, 1956, age 65  y.o. years. 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 -returns for routine follow-up.  She is on oxygen 3 L nasal cannula at nighttime BiPAP,  Flonase, Arnuity and Stiolto.  Overall she is feeling well.  Last visit she had persistent thrush.  She took Diflucan.  She thinks she has persistent thrush this time as well but upon close examination there is no more thrush.  Ritta Slot is resolved.  She was having weight loss following death of her sister but this seems to have stabilized weight is 164 pounds and stable.  Her last scans for lung cancer screening was November 2020.  She is coming up 1 year.  She is not having any hemoptysis.  Overall she feels stable.  She is in need of a flu shot and Covid booster vaccine.  She is willing to have these.  She did do a walking desaturation test for [place] on 2 L.  Resting heart rate was 89/min.  Pulse ox was 87% on RA at rest. Pulse ox 99% at rest on 2L.  She walked 1 lap on 2 L continuous and her pulse ox dropped to 81%.  Heart rate was 99/min. AT home use 3L    OV 07/17/2020   Subjective:  Patient ID: Katie Greene, female , DOB: 11/28/1955, age 70 y.o. years. , MRN: [id number],  ADDRESS: [place] [address] PCP  Leanna Battles, MD Providers : Treatment Team:  Attending Provider: Brand Males, MD Patient Care Team: Leanna Battles, MD as PCP - General (Internal Medicine)    Chief Complaint  Patient presents with   Follow-up    Patient wears 3 liters oxygen all the time. Patient fells good overall. Has been started on Nitro       HPI BAYLEI SIEBELS 65 y.o. -returns for routine follow-up of her COPD.  Her grief reaction has improved after the loss of her  sister.  Her weight loss is also improved.  She has gained weight.  COPD stable.  Her triple inhaler therapy isBREZTRI and subjectively she feels this is the best inhaler ever.  She is on an acetylcysteine.  She is on oxygen.  She had a low-dose CT scan of the chest.  Annual screening is recommended.  There is no pulmonary fibrosis no new issues.  Her FEV1 is a few years ago and her DLCO last was several years ago.  Based on both of that she might qualify for a referral for ZEPHRYV valve consideration .  We went over videos of this and discussed this in detail.  She is interested in a referral.  But for this  she needs recent PFT    CAT Score 01/29/2020 10/11/2019 01/02/2019  Total CAT Score 18 25 13       CAT COPD Symptom & Quality of Life Score ([place]) 0 is no burden. 5 is highest burden 12/31/2016  03/09/2017  10/11/2017  01/26/2018  04/28/2018 With nigight bipap 07/12/2018 onging bipap qhs 04/18/2019  06/14/2019   Never Cough -> Cough all the time 3 2 4 3 3 2 2 2   No phlegm in chest -> Chest is full of phlegm 3 2 3 4 2 2 1 2   No chest tightness -> Chest feels very tight 3 3 3 3  0 0 1 1  No dyspnea for 1 flight stairs/hill -> Very dyspneic for 1 flight of stairs 5 5 5 5 4 4 4 4   No limitations for ADL at home -> Very limited with ADL at home 5 3 5 5 5 2 2 2   Confident leaving home -> Not at all confident leaving home 2 5 3  4 3 0 1 2  Sleep soundly -> Do not sleep soundly because of lung condition 2 4 3 4 3 4 3 2   Lots of Energy -> No energy at all 4 3 3 3 4  0 3 3  TOTAL Score (max 40)  27 27 30 31 24 14 17 18     ROS  OV 10/23/2020  Subjective:  Patient ID: Katie Greene, female , DOB: [birth year] , age 65 y.o. , MRN: [id number] , ADDRESS: [place] [address] PCP Leanna Battles, MD Patient Care Team: Leanna Battles, MD as PCP - General (Internal Medicine)  This Provider for this visit: Treatment Team:  Attending Provider: Brand Males,  MD    10/23/2020 -   Chief Complaint  Patient presents with   Follow-up    Pt states she has been doing okay since last visit and denies any complaints.   Follow-up Gold stage IV COPD with chronic hypoxic respiratory failure -on BREZTRI and TRIOLOGY and NAC Last lung cancer screening CT November 2021 Last ABG August 2018 with PCO2 of 47 2 blood gas in the next few weeks.  HPI RAIMI GUILLERMO 65 y.o. -returns for follow-up.  Last visit she wanted to discuss about Zephyr valve.  Therefore we did repeat pulmonary function test in September 16, 2020.  Her FEV1 has declined her FVC has declined her DLCO is declined but still she has acceptable criteria for zephyr valve.  At this visit she tells me she does not want to go through the referral to [facility].  Her uncle passed away in Sep 16, 2020.  He was the main person driving her around.  She has no transport now.  In addition dealing with grief reaction.  Otherwise stable COPD with  BREZRI and N-acetylcysteine and 3 L of oxygen at rest.  We discussed lung transplantation and she is not interested.  She also has obesity and physically deconditioned and does not have good social support.  Finances can also be an issue.  She is due for her Pneumovax today she is just on 20 years and is been 7 years since her previous Pneumovax.  Review of the records indicate last CT scan of the chest was in November 2021.  Last ABG was in 2018.  The PCO2 at that time was 47.  On Trilogy vet  CT Chest data  No results found.     has a past medical history of Allergy, Anxiety, Arthritis, Chest pain, Chronic respiratory failure (HCC), COPD (chronic obstructive pulmonary disease) ([place]), Depression, Emphysema of lung (HCC), Fatigue, GERD (gastroesophageal reflux disease), Hemorrhoids, History of colon polyps, Hyperlipidemia, Hypertension, Iron deficiency anemia, On home oxygen therapy, Oxygen deficiency, Palpitations, Pulmonary nodules, Seasonal allergies, Wears dentures,  and Wears glasses.   OV 01/21/2021  Subjective:  Patient ID: Katie Greene, female , DOB: 02/05/1956 , age 18 y.o. , MRN: [id number] , ADDRESS: [place] [address] PCP Leanna Battles, MD Patient Care Team: Leanna Battles, MD as PCP - General (Internal Medicine)  This Provider for this visit: Treatment Team:  Attending Provider: Brand Males, MD    01/21/2021 -   Chief Complaint  Patient presents with   Follow-up    Pt states she has been doing okay since last visit and denies any real complaints.   Follow-up Gold stage IV COPD with chronic hypoxic respiratory failure -on BREZTRI and TRIOLOGY and NAC Last lung cancer screening CT November 2021 Last ABG August 2018  with PCO2 of 47 2 blood gas in the next few weeks.  HPI Katie Greene 65 y.o. -returns for her 52-month follow-up.  Overall she is doing well.  No new complaints.  Symptoms are stable.  She is planning a family reunion in [place].  No flareups.  No new issues no medical problems.  As before she is not interested in valve replacement therapy for her COPD    CT Chest data  No results found.      OV 07/15/2021  Subjective:  Patient ID: Katie Greene, female , DOB: [birth year] , age 58 y.o. , MRN: [id number] , ADDRESS: [address] [place] [address] PCP Leanna Battles, MD Patient Care Team: Leanna Battles, MD as PCP - General (Internal Medicine)  This Provider for this visit: Treatment Team:  Attending Provider: Brand Males, MD    07/15/2021 -   Chief Complaint  Patient presents with   Follow-up    Pt states she has been doing okay since last visit. States that she is becoming SOB easier than before.    Follow-up Gold stage IV COPD with chronic hypoxic respiratory failure -on BREZTRI and TRIOLOGY and NAC  Last lung cancer screening CT November 2021 and Nov 2022  Last ABG August 2018 with PCO2 of 47 2 blood gas in the next few  weeks.  HPI Katie SUKHU 65 y.o. -returns for follow-up.  Is a routine follow-up.  She continues on Breztri, 2 L oxygen, nighttime ventilator and an N-acetylcysteine.  Symptoms are stable no new issues.  November 2022 had low-dose CT scan of the chest no evidence of lung cancer.  Overall stable no fibrosis no pneumonia.  She wanted know if she needs to get a pneumonia vaccine review of the records indicate that she got Pneumovax in February 2022.  I asked about the COVID booster because its not documented but she said she got it recently.  No new issues.    CT Chest data Nov 2022  IMPRESSION: 1. Lung-RADS 2S, benign appearance or behavior. Continue annual screening with low-dose chest CT without contrast in 12 months. 2. The "S" modifier above refers to potentially clinically significant non lung cancer related findings. Specifically, there is aortic atherosclerosis, in addition to 2 vessel coronary artery disease. Please note that although the presence of coronary artery calcium documents the presence of coronary artery disease, the severity of this disease and any potential stenosis cannot be assessed on this non-gated CT examination. Assessment for potential risk factor modification, dietary therapy or pharmacologic therapy may be warranted, if clinically indicated. 3. Mild diffuse bronchial wall thickening with moderate centrilobular and paraseptal emphysema; imaging findings suggestive of underlying COPD.   Aortic Atherosclerosis (ICD10-I70.0) and Emphysema (ICD10-J43.9).     Electronically Signed   By: Vinnie Langton M.D.   On: 06/18/2021 10:13  No results found.    PFT  PFT Results Latest Ref Rng & Units 08/09/2020 12/12/2014 09/11/2014 06/21/2014 03/26/2014 01/04/2014 12/08/2013  FVC-Pre L 1.44 1.53 1.76 1.69 1.67 1.60 1.53  FVC-Predicted Pre % 49 50 58 55 54 52 50  FVC-Post L 1.51 1.80 1.78 1.69 1.80 1.64 1.82  FVC-Predicted Post % 52 59 58 55 59 53 59  Pre FEV1/FVC % %  42 44 45 48 47 44 47  Post FEV1/FCV % % 41 48 47 46 47 48 45  FEV1-Pre L 0.61 0.68 0.80 0.81 0.78 0.70 0.71  FEV1-Predicted Pre % 26 28 33 33 32 29 29  FEV1-Post L 0.62 0.86 0.83 0.78 0.85 0.78 0.81  DLCO uncorrected ml/min/mmHg 4.97 - - - - - -  DLCO UNC% % 22 - - - - - -  DLCO corrected ml/min/mmHg 4.97 - - - - - -  DLCO COR %Predicted % 22 - - - - - -  DLVA Predicted % 39 - - - - - -  TLC L 4.91 - - - - - -  TLC % Predicted % 88 - - - - - -  RV % Predicted % 134 - - - - - -       has a past medical history of Allergy, Anxiety, Arthritis, Chest pain, Chronic respiratory failure (HCC), COPD (chronic obstructive pulmonary disease) ([place]), Depression, Emphysema of lung (HCC), Fatigue, GERD (gastroesophageal reflux disease), Hemorrhoids, History of colon polyps, Hyperlipidemia, Hypertension, Iron deficiency anemia, On home oxygen therapy, Oxygen deficiency, Palpitations, Pulmonary nodules, Seasonal allergies, Wears dentures, and Wears glasses.   reports that she quit smoking about 9 years ago. Her smoking use included cigarettes. She started smoking about 52 years ago. She has a 12.50 pack-year smoking history. She has never used smokeless tobacco.  Past Surgical History:  Procedure Laterality Date   CHOLECYSTECTOMY     laparosopic attempted, then had to open   COLONOSCOPY     x2 with polyps removed   COLONOSCOPY N/A 08/27/2015   Procedure: COLONOSCOPY;  Surgeon: Manus Gunning, MD;  Location: [facility];  Service: Gastroenterology;  Laterality: N/A;   COLONOSCOPY WITH PROPOFOL N/A 01/17/2019   Procedure: COLONOSCOPY WITH PROPOFOL;  Surgeon: Yetta Flock, MD;  Location: [facility];  Service: Gastroenterology;  Laterality: N/A;   oopherectomy     1 ovary removed only   POLYPECTOMY     POLYPECTOMY  01/17/2019   Procedure: POLYPECTOMY;  Surgeon: Yetta Flock, MD;  Location: [facility];  Service: Gastroenterology;;   SALPINGECTOMY     1 fallopian tube removed     Allergies  Allergen Reactions   Daliresp [Roflumilast] Diarrhea, Nausea And Vomiting and Other (See Comments)    Upset stomach    Immunization History  Administered Date(s) Administered   Fluad Quad(high Dose 65+) 03/29/2019, 05/09/2020   Influenza Split 06/10/2012, 04/03/2014, 04/20/2015   Influenza, High Dose Seasonal PF 04/09/2018, 05/10/2021   Influenza,inj,Quad PF,6+ Mos 04/24/2013, 03/03/2016   Influenza-Unspecified 05/29/2017   PFIZER(Purple Top)SARS-COV-2 Vaccination 08/25/2019, 09/15/2019, 05/01/2020   Pneumococcal Conjugate-13 05/07/2015   Pneumococcal Polysaccharide-23 06/10/2012, 10/23/2020   Zoster Recombinat (Shingrix) 07/12/2018   Zoster, Live 05/10/2014    Family History  Problem Relation Age of Onset   Heart attack Mother    Pancreatic cancer Mother    Prostate cancer Father    Hypertension Father    Throat cancer Father    Throat cancer Brother    Prostate cancer Brother    Colon cancer Sister    Brain cancer Sister    Stomach cancer Neg Hx    Colon polyps Neg Hx    Rectal cancer Neg Hx    Esophageal cancer Neg Hx      Current Outpatient Medications:    Acetylcysteine (N-ACETYL-L-CYSTEINE) 600 MG CAPS, Take 600 mg by mouth 2 (two) times a day. , Disp: , Rfl:    albuterol (PROVENTIL) (2.5 MG/3ML) 0.083% nebulizer solution, Take 3 mLs (2.5 mg total) by nebulization every 4 (four) hours as needed for wheezing or shortness of breath. Dx code j44.9, Disp: 125 mL, Rfl: 6   albuterol (VENTOLIN HFA) 108 (90 Base) MCG/ACT  inhaler, INHALE 1 TO 2 PUFFS EVERY 6 HOURS AS NEEDED, Disp: 54 each, Rfl: 3   ALPRAZolam (XANAX) 0.5 MG tablet, Take 0.5 mg by mouth at bedtime as needed for anxiety or sleep. , Disp: , Rfl:    AMBULATORY NON FORMULARY MEDICATION, VENTILATOR machine at night, Disp: , Rfl:    aspirin EC 81 MG tablet, Take 81 mg by mouth daily. , Disp: , Rfl:    BREZTRI AEROSPHERE 160-9-4.8 MCG/ACT AERO, INHALE 2 PUFFS INTO THE LUNGS IN THE MORNING AND AT  BEDTIME., Disp: 10.7 g, Rfl: 10   buPROPion (WELLBUTRIN SR) 150 MG 12 hr tablet, Take 150 mg by mouth daily., Disp: , Rfl:    cetirizine (ZYRTEC) 10 MG tablet, Take 10 mg by mouth daily. , Disp: , Rfl:    diltiazem (CARDIZEM CD) 180 MG 24 hr capsule, Take 180 mg by mouth daily. , Disp: , Rfl:    ferrous sulfate 325 (65 FE) MG tablet, Take 325 mg by mouth daily with breakfast., Disp: , Rfl:    fluticasone (FLONASE) 50 MCG/ACT nasal spray, Place 1 spray into both nostrils at bedtime., Disp: 48 g, Rfl: 3   hydrochlorothiazide (HYDRODIURIL) 25 MG tablet, Take 25 mg by mouth daily. , Disp: , Rfl:    metoprolol succinate (TOPROL-XL) 50 MG 24 hr tablet, Take 50 mg by mouth every morning. Take with or immediately following a meal., Disp: , Rfl:    nitroGLYCERIN (NITROSTAT) 0.4 MG SL tablet, , Disp: , Rfl:    omeprazole (PRILOSEC) 20 MG capsule, Take 20 mg by mouth daily. , Disp: , Rfl:    OXYGEN, Inhale 2-3 L into the lungs daily., Disp: , Rfl:    pravastatin (PRAVACHOL) 80 MG tablet, Take 80 mg by mouth every morning. , Disp: , Rfl:    spironolactone (ALDACTONE) 50 MG tablet, Take 25 mg by mouth daily. , Disp: , Rfl:    temazepam (RESTORIL) 30 MG capsule, Take 30 mg by mouth at bedtime as needed for sleep. , Disp: , Rfl: 4   VITAMIN D PO, Take 5,000 Units by mouth daily. , Disp: , Rfl:       Objective:   Vitals:   07/15/21 1020  BP: 108/68  Pulse: 84  Temp: 98.3 F (36.8 C)  TempSrc: Oral  SpO2: 100%  Weight: 167 lb (75.8 kg)  Height: 5\' 6"  (1.676 m)    Estimated body mass index is 26.95 kg/m as calculated from the following:   Height as of this encounter: 5\' 6"  (1.676 m).   Weight as of this encounter: 167 lb (75.8 kg).  @WEIGHTCHANGE @  [employer]   07/15/21 1020  Weight: 167 lb (75.8 kg)     Physical Exam   General: No distress. Looks well Neuro: Alert and Oriented x 3. GCS 15. Speech normal Psych: Pleasant Resp:  Barrel Chest - yes.  Wheeze - no, Crackles - no, No  overt respiratory distress CVS: Normal heart sounds. Murmurs - no Ext: Stigmata of Connective Tissue Disease - no HEENT: Normal upper airway. PEERL +. No post nasal drip        Assessment:       ICD-10-CM   1. Stage 4 very severe COPD by GOLD classification ([place])  J44.9     2. Chronic respiratory failure with hypoxia (HCC)  J96.11          Plan:     Patient Instructions     ICD-10-CM   1. Stage 4 very severe COPD  by GOLD classification ([place])  J44.9     2. Chronic respiratory failure with hypoxia (HCC)  J96.11       Glad overall you are stable without any medical problems. Uptodate with pneumoocal pneumonia vaccine  - last was pneumovax in feb 2022 CT scan nov 2022 without lung cancer  Plan - Continue Breztri, oxygen, nighttime ventilator and N-acetylcysteine -Respect no interest in valve replacement therapy   Follow-up - Return in 6 months or sooner if needed; CAT score at followup    SIGNATURE    Dr. Brand Males, M.D., F.C.C.P,  Pulmonary and Critical Care Medicine Staff Physician, [place] Director - Interstitial Lung Disease  Program  P[facility] [place] [facility] [place], [place], [address]  Pager: [phone], If no answer or between  15:00h - 7:00h: call [phone] Telephone: [phone]  10:54 AM 07/15/2021

## 2021-07-15 NOTE — Patient Instructions (Addendum)
ICD-10-CM   1. Stage 4 very severe COPD by GOLD classification ([place])  J44.9     2. Chronic respiratory failure with hypoxia (HCC)  J96.11       Glad overall you are stable without any medical problems. Uptodate with pneumoocal pneumonia vaccine  - last was pneumovax in feb 2022 CT scan nov 2022 without lung cancer  Plan - Continue Breztri, oxygen, nighttime ventilator and N-acetylcysteine -Respect no interest in valve replacement therapy   Follow-up - Return in 6 months or sooner if needed; CAT score at followup

## 2021-08-12 DIAGNOSIS — I10 Essential (primary) hypertension: Secondary | ICD-10-CM | POA: Diagnosis not present

## 2021-08-12 DIAGNOSIS — E785 Hyperlipidemia, unspecified: Secondary | ICD-10-CM | POA: Diagnosis not present

## 2021-08-13 ENCOUNTER — Encounter: Payer: Self-pay | Admitting: Internal Medicine

## 2021-08-13 MED ORDER — FLUTICASONE PROPIONATE 50 MCG/ACT NA SUSP: 1.0000 | Freq: Every day | NASAL | 3 refills | Status: DC

## 2021-08-19 DIAGNOSIS — Z1212 Encounter for screening for malignant neoplasm of rectum: Secondary | ICD-10-CM | POA: Diagnosis not present

## 2021-08-19 DIAGNOSIS — M25562 Pain in left knee: Secondary | ICD-10-CM | POA: Diagnosis not present

## 2021-08-19 DIAGNOSIS — I700 Atherosclerosis of aorta: Secondary | ICD-10-CM | POA: Diagnosis not present

## 2021-08-19 DIAGNOSIS — K219 Gastro-esophageal reflux disease without esophagitis: Secondary | ICD-10-CM | POA: Diagnosis not present

## 2021-08-19 DIAGNOSIS — J439 Emphysema, unspecified: Secondary | ICD-10-CM | POA: Diagnosis not present

## 2021-08-19 DIAGNOSIS — I10 Essential (primary) hypertension: Secondary | ICD-10-CM | POA: Diagnosis not present

## 2021-08-19 DIAGNOSIS — Z9981 Dependence on supplemental oxygen: Secondary | ICD-10-CM | POA: Diagnosis not present

## 2021-08-19 DIAGNOSIS — Z0000 Encounter for general adult medical examination without abnormal findings: Secondary | ICD-10-CM | POA: Diagnosis not present

## 2021-08-19 DIAGNOSIS — I2781 Cor pulmonale (chronic): Secondary | ICD-10-CM | POA: Diagnosis not present

## 2021-08-19 DIAGNOSIS — E785 Hyperlipidemia, unspecified: Secondary | ICD-10-CM | POA: Diagnosis not present

## 2021-08-19 DIAGNOSIS — R82998 Other abnormal findings in urine: Secondary | ICD-10-CM | POA: Diagnosis not present

## 2021-09-11 DIAGNOSIS — M25562 Pain in left knee: Secondary | ICD-10-CM | POA: Diagnosis not present

## 2021-09-11 DIAGNOSIS — M25512 Pain in left shoulder: Secondary | ICD-10-CM | POA: Diagnosis not present

## 2021-09-11 DIAGNOSIS — G8929 Other chronic pain: Secondary | ICD-10-CM | POA: Diagnosis not present

## 2021-09-11 DIAGNOSIS — J439 Emphysema, unspecified: Secondary | ICD-10-CM | POA: Diagnosis not present

## 2021-09-11 DIAGNOSIS — I10 Essential (primary) hypertension: Secondary | ICD-10-CM | POA: Diagnosis not present

## 2021-11-30 DIAGNOSIS — J449 Chronic obstructive pulmonary disease, unspecified: Secondary | ICD-10-CM | POA: Diagnosis not present

## 2021-11-30 DIAGNOSIS — E785 Hyperlipidemia, unspecified: Secondary | ICD-10-CM | POA: Diagnosis not present

## 2021-11-30 DIAGNOSIS — I10 Essential (primary) hypertension: Secondary | ICD-10-CM | POA: Diagnosis not present

## 2021-12-03 ENCOUNTER — Other Ambulatory Visit: Payer: Self-pay | Admitting: Internal Medicine

## 2021-12-15 ENCOUNTER — Other Ambulatory Visit: Payer: Self-pay | Admitting: Internal Medicine

## 2021-12-23 ENCOUNTER — Encounter: Payer: Self-pay | Admitting: Gastroenterology

## 2021-12-24 ENCOUNTER — Telehealth: Payer: Self-pay | Admitting: *Deleted

## 2021-12-24 NOTE — Telephone Encounter (Signed)
Dr.Armbruster,  This patient is schcedule for a 3 year recall colon, hx polyps. She is on Oxygen, hx COPD. Last colon 2020 at [facility]. Last OV with Amy 2020. Would you like at direct hospital colon or OV? Please advise. Thank you, Narissa [place] PV

## 2021-12-24 NOTE — Telephone Encounter (Signed)
Called pt, no answer. Left message for the pt to call us back to make OV. Colon and PV cancelled.

## 2021-12-24 NOTE — Telephone Encounter (Signed)
OV made with pt.

## 2021-12-24 NOTE — Telephone Encounter (Signed)
Office visit preferred. Thanks

## 2021-12-31 DIAGNOSIS — H2513 Age-related nuclear cataract, bilateral: Secondary | ICD-10-CM | POA: Diagnosis not present

## 2021-12-31 DIAGNOSIS — H35033 Hypertensive retinopathy, bilateral: Secondary | ICD-10-CM | POA: Diagnosis not present

## 2022-01-04 NOTE — Patient Instructions (Signed)
ICD-10-CM   1. 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 Chronic respiratory failure with hypoxia (HCC)  J96.11     3. COPD, frequent exacerbations (HCC)  J44.1        Glad overall you are stable without any medical problems. Noted 2 flares up so far this year 2023  - Jan 2023 and MAy 2023 - home prednisone Treatment Noted upcoming insurance change Noted need to refill your albuterol neb Noted CT scan nov 2022 without lung cancer  Plan - Continue Breztri, oxygen, nighttime ventilator and N-acetylcysteine -Respect no interest in valve replacement therapy - Refill 2 refills   - Please take prednisone 40 mg x1 day, then 30 mg x1 day, then 20 mg x1 day, then 10 mg x1 day, and then 5 mg x1 day and stop  - Take doxycycline 100mg  po twice daily x 5 days; take after meals and avoid sunlight - Refill albuterol neb - Get RSV vacciine in fall 2023  - Do DME referral to [facility] for  your oxygen and TRILOGY ventilator  (to [facility] over from [facility])  - sending your name to Jola Babinski of [employer] for research    Follow-up - Return in 6 months or sooner if needed; CAT score at followup

## 2022-01-04 NOTE — Progress Notes (Unsigned)
07/12/2018 -   Chief Complaint  Patient presents with   Follow-up    pt states breathing is baseline. c/o occ prod cough with yellowish to white mucus.     HPI Katie Greene 66 y.o. 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  She continues on nocturnal BiPAP.  This is really improved her symptom score.  COPD CAT score is now 14.  She is on triple inhaler therapy and oxygen as well.  #2019 CT scan of the chest did not show any evidence of lung cancer.  Annual screening is recommended.  Her new issues this visit are that she has unintentional weight loss of 12 pounds.  She is frustrated by this.  She has seen GI and is seeing a primary care physician for this.  She is also asking for travel advice encounter.  She is going to [place] for 4 days for the weekend with her friends to celebrate her recent divorce.  She is asking for preemptive antibiotic and prednisone    OV 04/18/2019  Subjective:  Patient ID: Katie Greene, female , DOB: [birth year] , age 39 y.o. , MRN: [id number] , ADDRESS: [address] [place] [address]   04/18/2019 -   Chief Complaint  Patient presents with   Stage 4 very severe COPD by GOLD classification    Breathing is the same as at last visit in December 2019     HPI [name] 66 y.o. -follows up for end-stage Gold stage IV COPD.  Last seen early part of 2020.  After the onset of the pandemic she has hunker down and socially distanced.  She has limited her activities to very low risk.  She goes out to the grocery store with a mask.  She has avoided people.  She continues to be on Arnuity and Stiolto and nighttime BiPAP with Flonase and daily schedule oxygen no new problems.  She just wants refills of doxycycline and prednisone in case of a future exacerbation.  But otherwise doing well.  CAT score shows stability.         OV 06/14/2019  Subjective:  Patient ID: Katie Greene, female ,  DOB: 1956/07/14 , age 64 y.o. , MRN: [id number] , ADDRESS: [address] [place] [address]   06/14/2019 -   Chief Complaint  Patient presents with   Follow-up    Pt states she has been doing well since last visit and denies any complaints.   Follow-up stage IV COPD with chronic hypoxemic respiratory failure  HPI Katie Greene 66 y.o. -presents for follow-up.  Last saw her 2 months ago.  She was supposed to come between 3 and 6 months.  However due to some scheduling issues she is back in 2 months.  She has no new symptoms.  COPD CAT score is listed below and stable.  She is on oxygen daytime, Flonase, Arnuity, Stiolto and night BiPAP.  She is doing well.  She had a low-dose CT scan of the chest and findings of benign and a repeat CT scan in 1 year has been recommended.    IMPRESSION: 1. Lung-RADS 2, benign appearance or behavior. Continue annual screening with low-dose chest CT without contrast in 12 months. 2. Aortic Atherosclerosis (ICD10-I70.0) and Emphysema (ICD10-J43.9).     Electronically Signed   By: Misty Stanley M.D.   On: 06/05/2019 11:48 ROS - per HPI   OV 10/11/2019  Subjective:  Patient ID: Katie Greene, female , DOB: 1956-01-21 , age 66 y.o. , MRN: [id number] , ADDRESS: [address] [place] [address]   10/11/2019 -   Chief Complaint  Patient presents with   Follow-up    Pt states she has been doing good since last visit and denies any current complaints.     HPI Katie Greene 66 y.o. -presents for COPD follow-up.  Last seen January 2020.  She continues on triple inhaler therapy oxygen at nighttime BiPAP.  Overall stable.  She has had a Covid vaccine.  She is isolating well in social distancing and masking.  Cancer screening: Last CT scan of the chest low-dose CT chest Nov 2020 without any evidence of cancer.  Follow-up recommended in 1 year.  Her advanced COPD makes it difficult to have any successful intervention.  She is agreed to  have her CT chest in 15 months - 18 months which would be in early 2022.  I reviewed and interpreted the result myself.  New issue: On exam she had oral Greene.  This is new.  Last nystatin intake was over a year ago according to her history.  IMPRESSION: 1. Lung-RADS 2, benign appearance or behavior. Continue annual screening with low-dose chest CT without contrast in 12 months. 2. Aortic Atherosclerosis (ICD10-I70.0) and Emphysema (ICD10-J43.9).     Electronically Signed   By: Misty Stanley M.D.   On: 06/05/2019 11:48 ROS - per HPI   OV 01/29/2020  Subjective:  Patient ID: Katie Greene, female , DOB: 18-Aug-1955 , age 10 y.o. , MRN: [id number] , ADDRESS: [address] [place] [address]   01/29/2020 -   Chief Complaint  Patient presents with   Follow-up    cough with "little bit of light yellow sputum"     HPI Katie Greene 66 y.o. -returns for follow-up of for her advanced COPD.  She remains on oxygen.  She is on inhaler therapy and nighttime BiPAP.  She tells me that [facility] pharmacy is shut down and run out of business.  She wants another pharmacy to pick up her albuterol nebulizer.  Other than that in Dec 14, 2019 her stepfather died at age 43s because of renal failure.  She is in grief reaction.  Sometime before that she started losing weight.  Currently weight is 163 pounds.  It is unintentional.  However she thinks it is because of grief reaction.  In addition for the last 1 week she has a little bit of light yellow sputum there is a change in color.  There is no increased wheezing or no increased cough.  Nevertheless she think she will benefit from antibiotics.  She wants to keep prednisone handy.  Last visit we treated her for oral Greene with nystatin swish and swallow.  This visit she still has oral Greene.  She says she rinses her mouth pretty diligently.     OV 04/30/2020   Subjective:  Patient ID: Katie Greene, female , DOB: 09/27/1955, age  66 y.o. years. 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 -returns for routine follow-up.  She is on oxygen 3 L nasal cannula at  nighttime BiPAP, Flonase, Arnuity and Stiolto.  Overall she is feeling well.  Last visit she had persistent Greene.  She took Diflucan.  She thinks she has persistent Greene this time as well but upon close examination there is no more Greene.  Ritta Slot is resolved.  She was having weight loss following death of her sister but this seems to have stabilized weight is 164 pounds and stable.  Her last scans for lung cancer screening was November 2020.  She is coming up 1 year.  She is not having any hemoptysis.  Overall she feels stable.  She is in need of a flu shot and Covid booster vaccine.  She is willing to have these.  She did do a walking desaturation test for [place] on 2 L.  Resting heart rate was 89/min.  Pulse ox was 87% on RA at rest. Pulse ox 99% at rest on 2L.  She walked 1 lap on 2 L continuous and her pulse ox dropped to 81%.  Heart rate was 99/min. AT home use 3L    OV 07/17/2020   Subjective:  Patient ID: Katie Greene, female , DOB: [birth year], age 20 y.o. years. , MRN: [id number],  ADDRESS: [place] [address] PCP  Leanna Battles, MD Providers : Treatment Team:  Attending Provider: Brand Males, MD Patient Care Team: Leanna Battles, MD as PCP - General (Internal Medicine)    Chief Complaint  Patient presents with   Follow-up    Patient wears 3 liters oxygen all the time. Patient fells good overall. Has been started on Nitro       HPI DELANEE XIN 66 y.o. -returns for routine follow-up of her COPD.  Her grief reaction has improved after the loss of  her sister.  Her weight loss is also improved.  She has gained weight.  COPD stable.  Her triple inhaler therapy isBREZTRI and subjectively she feels this is the best inhaler ever.  She is on an acetylcysteine.  She is on oxygen.  She had a low-dose CT scan of the chest.  Annual screening is recommended.  There is no pulmonary fibrosis no new issues.  Her FEV1 is a few years ago and her DLCO last was several years ago.  Based on both of that she might qualify for a referral for ZEPHRYV valve consideration .  We went over videos of this and discussed this in detail.  She is interested in a referral.  But for this  she needs recent PFT    CAT Score 01/29/2020 10/11/2019 01/02/2019  Total CAT Score '18 25 13      '$ CAT COPD Symptom & Quality of Life Score ([place]) 0 is no burden. 5 is highest burden 12/31/2016  03/09/2017  10/11/2017  01/26/2018  04/28/2018 With nigight bipap 07/12/2018 onging bipap qhs 04/18/2019  06/14/2019   Never Cough -> Cough all the time '3 2 4 3 3 2 2 2  '$ No phlegm in chest -> Chest is full of phlegm '3 2 3 4 2 2 1 2  '$ No chest tightness -> Chest feels very tight '3 3 3 3 '$ 0 0 1 1  No dyspnea for 1 flight stairs/hill -> Very dyspneic for 1 flight of stairs '5 5 5 5 4 4 4 4  '$ No limitations for ADL at home -> Very limited with ADL at home '5 3 5 5 5 2 2 2  '$ Confident leaving home -> Not at all confident leaving home 2  $'5 3 4 3 'L$ 0 1 2  Sleep soundly -> Do not sleep soundly because of lung condition '2 4 3 4 3 4 3 2  '$ Lots of Energy -> No energy at all '4 3 3 3 4 '$ 0 3 3  TOTAL Score (max 40)  '27 27 30 31 24 14 17 18    '$ ROS  OV 10/23/2020  Subjective:  Patient ID: Katie Greene, female , DOB: November 13, 1955 , age 2 y.o. , MRN: [id number] , ADDRESS: [place] [address] PCP Leanna Battles, MD Patient Care Team: Leanna Battles, MD as PCP - General (Internal Medicine)  This Provider for this visit: Treatment Team:  Attending Provider: Brand Males,  MD    10/23/2020 -   Chief Complaint  Patient presents with   Follow-up    Pt states she has been doing okay since last visit and denies any complaints.   Follow-up Gold stage IV COPD with chronic hypoxic respiratory failure -on BREZTRI and TRIOLOGY and NAC Last lung cancer screening CT November 2021 Last ABG August 2018 with PCO2 of 47 2 blood gas in the next few weeks.  HPI KAITELYN JAMISON 66 y.o. -returns for follow-up.  Last visit she wanted to discuss about Zephyr valve.  Therefore we did repeat pulmonary function test in 08/21/20.  Her FEV1 has declined her FVC has declined her DLCO is declined but still she has acceptable criteria for zephyr valve.  At this visit she tells me she does not want to go through the referral to [facility].  Her uncle passed away in August 21, 2020.  He was the main person driving her around.  She has no transport now.  In addition dealing with grief reaction.  Otherwise stable COPD with  BREZRI and N-acetylcysteine and 3 L of oxygen at rest.  We discussed lung transplantation and she is not interested.  She also has obesity and physically deconditioned and does not have good social support.  Finances can also be an issue.  She is due for her Pneumovax today she is just on 47 years and is been 7 years since her previous Pneumovax.  Review of the records indicate last CT scan of the chest was in November 2021.  Last ABG was in 2018.  The PCO2 at that time was 47.  On Trilogy vet  CT Chest data  No results found.     has a past medical history of Allergy, Anxiety, Arthritis, Chest pain, Chronic respiratory failure (HCC), COPD (chronic obstructive pulmonary disease) ([place]), Depression, Emphysema of lung (HCC), Fatigue, GERD (gastroesophageal reflux disease), Hemorrhoids, History of colon polyps, Hyperlipidemia, Hypertension, Iron deficiency anemia, On home oxygen therapy, Oxygen deficiency, Palpitations, Pulmonary nodules, Seasonal allergies, Wears dentures,  and Wears glasses.   OV 01/21/2021  Subjective:  Patient ID: Katie Greene, female , DOB: 06/26/1956 , age 14 y.o. , MRN: [id number] , ADDRESS: [place] [address] PCP Leanna Battles, MD Patient Care Team: Leanna Battles, MD as PCP - General (Internal Medicine)  This Provider for this visit: Treatment Team:  Attending Provider: Brand Males, MD    01/21/2021 -   Chief Complaint  Patient presents with   Follow-up    Pt states she has been doing okay since last visit and denies any real complaints.   Follow-up Gold stage IV COPD with chronic hypoxic respiratory failure -on BREZTRI and TRIOLOGY and NAC Last lung cancer screening CT November 2021 Last ABG  August 2018 with PCO2 of 47 2 blood gas in the next few weeks.  HPI DEEANNE DEININGER 66 y.o. -returns for her 31-monthfollow-up.  Overall she is doing well.  No new complaints.  Symptoms are stable.  [place]  No flareups.  No new issues no medical problems.  As before she is not interested in valve replacement therapy for her COPD    CT Chest data  No results found.      OV 07/15/2021  Subjective:  Patient ID: PJunius Greene female , DOB: 4Feb 22, 1957, age 66y.o. , MRN: [id number], ADDRESS: [address] [place] [id number] PLeanna Battles MD Patient Care Team: PLeanna Battles MD as PCP - General (Internal Medicine)  This Provider for this visit: Treatment Team:  Attending Provider: RBrand Males MD    07/15/2021 -   Chief Complaint  Patient presents with   Follow-up    Pt states she has been doing okay since last visit. States that she is becoming SOB easier than before.    Follow-up Gold stage IV COPD with chronic hypoxic respiratory failure -on BREZTRI and TRIOLOGY and NAC  Last lung cancer screening CT November 2021 and Nov 2022  Last ABG August 2018 with PCO2 of 47 2 blood gas in the next few  weeks.  HPI PLAKAYLA BARRINGTON89y.o. -returns for follow-up.  Is a routine follow-up.  She continues on Breztri, 2 L oxygen, nighttime ventilator and an N-acetylcysteine.  Symptoms are stable no new issues.  November 2022 had low-dose CT scan of the chest no evidence of lung cancer.  Overall stable no fibrosis no pneumonia.  She wanted know if she needs to get a pneumonia vaccine review of the records indicate that she got Pneumovax in February 2022.  I asked about the COVID booster because its not documented but she said she got it recently.  No new issues.    CT Chest data Nov 2022  IMPRESSION: 1. Lung-RADS 2S, benign appearance or behavior. Continue annual screening with low-dose chest CT without contrast in 12 months. 2. The "S" modifier above refers to potentially clinically significant non lung cancer related findings. Specifically, there is aortic atherosclerosis, in addition to 2 vessel coronary artery disease. Please note that although the presence of coronary artery calcium documents the presence of coronary artery disease, the severity of this disease and any potential stenosis cannot be assessed on this non-gated CT examination. Assessment for potential risk factor modification, dietary therapy or pharmacologic therapy may be warranted, if clinically indicated. 3. Mild diffuse bronchial wall thickening with moderate centrilobular and paraseptal emphysema; imaging findings suggestive of underlying COPD.   Aortic Atherosclerosis (ICD10-I70.0) and Emphysema (ICD10-J43.9).     Electronically Signed   By: DVinnie LangtonM.D.   On: 06/18/2021 10:13  No results found.    PFT OV 01/05/2022  Subjective:  Patient ID: PJunius Greene female , DOB: [birth year], age 66y.o. , MRN: [id number], [place] [id number] PDonnajean Lopes MD Patient Care Team: PDonnajean Lopes MD as PCP - General (Internal Medicine)  This Provider for  this visit: Treatment Team:  Attending Provider: RBrand Males MD    01/05/2022 -   Chief Complaint  Patient presents with   Follow-up    Pt states she has been doing okay since last visit. States she feels like she is not able to  be as active as she was before.     HPI KAMILLAH DIDONATO 66 y.o. -returns for routine follow-up for her advanced COPD.  Since her last visit she has had 2 exacerbations 1 in January 2023 and another in May 2023 from the pollen.  Both times she used prednisone with her antibiotics.  She has steady refill on prednisone so she is self-directed of prednisone for exacerbation management.  This avoids her from calling us or going to the ER.  She continues triple inhaler therapy Flonase albuterol and acetylcysteine and trilogy ventilator.  Her insurance company is changing from [employer] to [employer].  She wants to make sure there is a new order for her oxygen and also noninvasive ventilation at night.  She wants her albuterol nebulizer refilled.  She is interested in research protocols.  We did briefly discussed ARNASA study.  She is very interested.     PFT     Latest Ref Rng & Units 08/09/2020   11:48 AM 12/12/2014    8:08 AM 09/11/2014    8:55 AM 06/21/2014    9:01 AM 03/26/2014    9:51 AM 01/04/2014    8:01 AM 12/08/2013    7:54 AM  PFT Results  FVC-Pre L 1.44   1.53   1.76   1.69  P 1.67  P 1.60  P 1.53  P  FVC-Predicted Pre % 49   50   58   55  P 54  P 52  P 50  P  FVC-Post L 1.51   1.80   1.78   1.69  P 1.80  P 1.64  P 1.82  P  FVC-Predicted Post % 52   59   58   55  P 59  P 53  P 59  P  Pre FEV1/FVC % % 42   44   45   48  P 47  P 44  P 47  P  Post FEV1/FCV % % 41   48   47   46  P 47  P 48  P 45  P  FEV1-Pre L 0.61   0.68   0.80   0.81  P 0.78  P 0.70  P 0.71  P  FEV1-Predicted Pre % 26   28   33   33  P 32  P 29  P 29  P  FEV1-Post L 0.62   0.86   0.83   0.78  P 0.85  P 0.78  P 0.81  P  DLCO uncorrected ml/min/mmHg 4.97          DLCO UNC% % 22          DLCO  corrected ml/min/mmHg 4.97          DLCO COR %Predicted % 22          DLVA Predicted % 39          TLC L 4.91          TLC % Predicted % 88          RV % Predicted % 134            P Preliminary result       has a past medical history of Allergy, Anxiety, Arthritis, Chest pain, Chronic respiratory failure (HCC), COPD (chronic obstructive pulmonary disease) (HCC), Depression, Emphysema of lung (HCC), Fatigue, GERD (gastroesophageal reflux disease), Hemorrhoids, History of colon polyps, Hyperlipidemia, Hypertension, Iron deficiency anemia, On home oxygen therapy, Oxygen deficiency,  Palpitations, Pulmonary nodules, Seasonal allergies, Wears dentures, and Wears glasses.   reports that she quit smoking about 9 years ago. Her smoking use included cigarettes. She started smoking about 53 years ago. She has a 12.50 pack-year smoking history. She has never used smokeless tobacco.  Past Surgical History:  Procedure Laterality Date   CHOLECYSTECTOMY     laparosopic attempted, then had to open   COLONOSCOPY     x2 with polyps removed   COLONOSCOPY N/A 08/27/2015   Procedure: COLONOSCOPY;  Surgeon: Manus Gunning, MD;  Location: [facility];  Service: Gastroenterology;  Laterality: N/A;   COLONOSCOPY WITH PROPOFOL N/A 01/17/2019   Procedure: COLONOSCOPY WITH PROPOFOL;  Surgeon: Yetta Flock, MD;  Location: [facility];  Service: Gastroenterology;  Laterality: N/A;   oopherectomy     1 ovary removed only   POLYPECTOMY     POLYPECTOMY  01/17/2019   Procedure: POLYPECTOMY;  Surgeon: Yetta Flock, MD;  Location: [facility];  Service: Gastroenterology;;   SALPINGECTOMY     1 fallopian tube removed    Allergies  Allergen Reactions   Daliresp [Roflumilast] Diarrhea, Nausea And Vomiting and Other (See Comments)    Upset stomach    Immunization History  Administered Date(s) Administered   Fluad Quad(high Dose 65+) 03/29/2019, 05/09/2020   Influenza Split 06/10/2012,  04/03/2014, 04/20/2015   Influenza, High Dose Seasonal PF 04/09/2018, 05/10/2021   Influenza,inj,Quad PF,6+ Mos 04/24/2013, 03/03/2016   Influenza-Unspecified 05/29/2017   PFIZER(Purple Top)SARS-COV-2 Vaccination 08/25/2019, 09/15/2019, 05/01/2020   Pneumococcal Conjugate-13 05/07/2015   Pneumococcal Polysaccharide-23 06/10/2012, 10/23/2020   Zoster Recombinat (Shingrix) 07/12/2018   Zoster, Live 05/10/2014    Family History  Problem Relation Age of Onset   Heart attack Mother    Pancreatic cancer Mother    Prostate cancer Father    Hypertension Father    Throat cancer Father    Throat cancer Brother    Prostate cancer Brother    Colon cancer Sister    Brain cancer Sister    Stomach cancer Neg Hx    Colon polyps Neg Hx    Rectal cancer Neg Hx    Esophageal cancer Neg Hx      Current Outpatient Medications:    Acetylcysteine (N-ACETYL-L-CYSTEINE) 600 MG CAPS, Take 600 mg by mouth 2 (two) times a day. , Disp: , Rfl:    albuterol (VENTOLIN HFA) 108 (90 Base) MCG/ACT inhaler, INHALE 1 TO 2 PUFFS EVERY 6 HOURS AS NEEDED, Disp: 3 each, Rfl: 3   ALPRAZolam (XANAX) 0.5 MG tablet, Take 0.5 mg by mouth at bedtime as needed for anxiety or sleep. , Disp: , Rfl:    AMBULATORY NON FORMULARY MEDICATION, VENTILATOR machine at night, Disp: , Rfl:    aspirin EC 81 MG tablet, Take 81 mg by mouth daily. , Disp: , Rfl:    BREZTRI AEROSPHERE 160-9-4.8 MCG/ACT AERO, INHALE 2 PUFFS INTO THE LUNGS IN THE MORNING AND AT BEDTIME., Disp: 32.1 g, Rfl: 3   buPROPion (WELLBUTRIN SR) 150 MG 12 hr tablet, Take 150 mg by mouth daily., Disp: , Rfl:    cetirizine (ZYRTEC) 10 MG tablet, Take 10 mg by mouth daily. , Disp: , Rfl:    diltiazem (CARDIZEM CD) 180 MG 24 hr capsule, Take 180 mg by mouth daily. , Disp: , Rfl:    doxycycline (VIBRA-TABS) 100 MG tablet, Take 1 tablet (100 mg total) by mouth 2 (two) times daily., Disp: 10 tablet, Rfl: 2   ferrous sulfate 325 (65 FE) MG tablet,  Take 325 mg by mouth daily  with breakfast., Disp: , Rfl:    fluticasone (FLONASE) 50 MCG/ACT nasal spray, Place 1 spray into both nostrils at bedtime., Disp: 48 g, Rfl: 3   hydrochlorothiazide (HYDRODIURIL) 25 MG tablet, Take 25 mg by mouth daily. , Disp: , Rfl:    metoprolol succinate (TOPROL-XL) 50 MG 24 hr tablet, Take 50 mg by mouth every morning. Take with or immediately following a meal., Disp: , Rfl:    nitroGLYCERIN (NITROSTAT) 0.4 MG SL tablet, , Disp: , Rfl:    omeprazole (PRILOSEC) 20 MG capsule, Take 20 mg by mouth daily. , Disp: , Rfl:    OXYGEN, Inhale 2-3 L into the lungs daily., Disp: , Rfl:    pravastatin (PRAVACHOL) 80 MG tablet, Take 80 mg by mouth every morning. , Disp: , Rfl:    predniSONE (DELTASONE) 10 MG tablet, Take 4 tablets (40 mg total) by mouth daily with breakfast for 1 day, THEN 3 tablets (30 mg total) daily with breakfast for 1 day, THEN 2 tablets (20 mg total) daily with breakfast for 1 day, THEN 1 tablet (10 mg total) daily with breakfast for 1 day, THEN 0.5 tablets (5 mg total) daily with breakfast for 1 day., Disp: 10.5 tablet, Rfl: 2   spironolactone (ALDACTONE) 50 MG tablet, Take 25 mg by mouth daily. , Disp: , Rfl:    temazepam (RESTORIL) 30 MG capsule, Take 30 mg by mouth at bedtime as needed for sleep. , Disp: , Rfl: 4   VITAMIN D PO, Take 5,000 Units by mouth daily. , Disp: , Rfl:    albuterol (PROVENTIL) (2.5 MG/3ML) 0.083% nebulizer solution, Take 3 mLs (2.5 mg total) by nebulization every 4 (four) hours as needed for wheezing or shortness of breath. Dx code j73.9, Disp: 125 mL, Rfl: 6      Objective:   Vitals:   01/05/22 1127  BP: 128/76  Pulse: 95  SpO2: 99%  Weight: 171 lb (77.6 kg)  Height: '5\' 6"'$  (1.676 m)    Estimated body mass index is 27.6 kg/m as calculated from the following:   Height as of this encounter: '5\' 6"'$  (1.676 m).   Weight as of this encounter: 171 lb (77.6 kg).  '@WEIGHTCHANGE'$ @  Filed Weights   01/05/22 1127  Weight: 171 lb (77.6 kg)      Physical Exam General: No distress.  On oxygen Neuro: Alert and Oriented x 3. GCS 15. Speech normal Psych: Pleasant Resp:  Barrel Chest - YES  Wheeze - no, Crackles - no.  Barrel chested, No overt respiratory distress CVS: Normal heart sounds. Murmurs - no Ext: Stigmata of Connective Tissue Disease - no HEENT: Normal upper airway. PEERL +. No post nasal drip        Assessment:       ICD-10-CM   1. Stage 4 very severe COPD by GOLD classification ([place])  J44.9 Ambulatory Referral for DME    Ambulatory Referral for DME    2. Chronic respiratory failure with hypoxia ([place])  J96.11 Ambulatory Referral for DME    Ambulatory Referral for DME    3. COPD, frequent exacerbations ([place])  J44.1 Ambulatory Referral for DME    Ambulatory Referral for DME         Plan:     Patient Instructions     ICD-10-CM   1. Stage 4 very severe COPD by GOLD classification ([place])  J44.9     2. Chronic respiratory failure with hypoxia (HCC)  J96.11  3. COPD, frequent exacerbations ([place])  J44.1        Glad overall you are stable without any medical problems. Noted 2 flares up so far this year 2023  - Jan 2023 and MAy 2023 - home prednisone Treatment Noted upcoming insurance change Noted need to refill your albuterol neb Noted CT scan nov 2022 without lung cancer  Plan - Continue Breztri, oxygen, nighttime ventilator and N-acetylcysteine -Respect no interest in valve replacement therapy - Refill 2 refills   - Please take prednisone 40 mg x1 day, then 30 mg x1 day, then 20 mg x1 day, then 10 mg x1 day, and then 5 mg x1 day and stop  - Take doxycycline '100mg'$  po twice daily x 5 days; take after meals and avoid sunlight - Refill albuterol neb - Get RSV vacciine in fall 2023  - Do DME referral to [facility] for  your oxygen and TRILOGY ventilator  (to take over from [facility])  - sending your name to Leda Gauze of [employer] for research    Follow-up - Return in 6 months or sooner if needed; CAT  score at followup    SIGNATURE    Dr. Brand Males, M.D., F.C.C.P,  Pulmonary and Critical Care Medicine Staff Physician, [place] Director - Interstitial Lung Disease  Program  [facility] [place] at [place], [place], [address]  Pager: [phone], If no answer or between  15:00h - 7:00h: call [phone] Telephone: [phone]  1:01 PM 01/05/2022

## 2022-01-05 ENCOUNTER — Ambulatory Visit: Payer: Medicare HMO | Admitting: Internal Medicine

## 2022-01-05 ENCOUNTER — Telehealth: Payer: Self-pay | Admitting: Internal Medicine

## 2022-01-05 ENCOUNTER — Encounter: Payer: Self-pay | Admitting: Internal Medicine

## 2022-01-05 VITALS — BP 128/76 | HR 95 | Ht 66.0 in | Wt 171.0 lb

## 2022-01-05 DIAGNOSIS — J9611 Chronic respiratory failure with hypoxia: Secondary | ICD-10-CM | POA: Diagnosis not present

## 2022-01-05 DIAGNOSIS — J441 Chronic obstructive pulmonary disease with (acute) exacerbation: Secondary | ICD-10-CM

## 2022-01-05 DIAGNOSIS — J449 Chronic obstructive pulmonary disease, unspecified: Secondary | ICD-10-CM

## 2022-01-05 MED ORDER — DOXYCYCLINE HYCLATE 100 MG PO TABS: 100.0000 mg | ORAL_TABLET | Freq: Two times a day (BID) | ORAL | 2 refills | Status: DC

## 2022-01-05 MED ORDER — ALBUTEROL SULFATE (2.5 MG/3ML) 0.083% IN NEBU: 2.5000 mg | INHALATION_SOLUTION | RESPIRATORY_TRACT | 6 refills | Status: DC | PRN

## 2022-01-05 MED ORDER — PREDNISONE 10 MG PO TABS: ORAL_TABLET | ORAL | 2 refills | Status: AC

## 2022-01-06 NOTE — Telephone Encounter (Signed)
At pt's last OV, pt was wanting to switch DME from [facility] to [facility] so pt was rewalked and an order for the DME switch was placed. Attempted to call pt but unable to reach.  We need to figure out if the home concentrator that pt currently has is one that [place] provided her with or if is is one that she owns, or if it is one that has been brought to her by [facility].  If it is one that [facility] provided her with, once pt receives equipment from [facility], then we can place an order to have [employer]'s equipment picked up.  Will await a return call from pt.

## 2022-01-27 ENCOUNTER — Ambulatory Visit: Payer: Medicare HMO | Admitting: Gastroenterology

## 2022-01-27 ENCOUNTER — Encounter: Payer: Self-pay | Admitting: Gastroenterology

## 2022-01-27 VITALS — BP 116/80 | HR 100 | Ht 66.0 in | Wt 170.0 lb

## 2022-01-27 DIAGNOSIS — K219 Gastro-esophageal reflux disease without esophagitis: Secondary | ICD-10-CM

## 2022-01-27 DIAGNOSIS — D509 Iron deficiency anemia, unspecified: Secondary | ICD-10-CM

## 2022-01-27 DIAGNOSIS — Z9981 Dependence on supplemental oxygen: Secondary | ICD-10-CM

## 2022-01-27 DIAGNOSIS — Z86010 Personal history of colonic polyps: Secondary | ICD-10-CM

## 2022-01-27 DIAGNOSIS — R1011 Right upper quadrant pain: Secondary | ICD-10-CM

## 2022-02-10 ENCOUNTER — Telehealth: Payer: Self-pay | Admitting: Gastroenterology

## 2022-02-10 ENCOUNTER — Telehealth: Payer: Self-pay

## 2022-02-10 ENCOUNTER — Other Ambulatory Visit: Payer: Self-pay

## 2022-02-10 DIAGNOSIS — K219 Gastro-esophageal reflux disease without esophagitis: Secondary | ICD-10-CM

## 2022-02-10 DIAGNOSIS — Z86010 Personal history of colonic polyps: Secondary | ICD-10-CM

## 2022-02-10 DIAGNOSIS — D509 Iron deficiency anemia, unspecified: Secondary | ICD-10-CM

## 2022-02-10 MED ORDER — NA SULFATE-K SULFATE-MG SULF 17.5-3.13-1.6 GM/177ML PO SOLN: 1.0000 | ORAL | 0 refills | Status: DC

## 2022-02-10 MED ORDER — PLENVU 140 G PO SOLR: 1.0000 | Freq: Once | ORAL | 0 refills | Status: AC

## 2022-02-10 NOTE — Telephone Encounter (Signed)
Dr. Havery Moros had a cancellation at [facility] on 02/19/22. I called and spoke with patient to offer her an appt for EGD/colon at [facility] on 02/19/22 at 9:45 am. Pt would need to arrive at 8:15 am with a care partner. Pt would like to take this appt. Pt has requested Suprep. Pt is aware that I will send Sup[facility]. Pt reports have access to MyChart and is aware that I will send her instructions to her via [place]. Pt denies any anticoagulation therapy and denies being a diabetic. Pt verbalized understanding of all information and had no concerns at the end of the call.  Ambulatory referral to GI in epic. Secure staff message sent to precert to let them know case added on for next week. Suprep sent to pharmacy on file. EGD/colon instructions sent to patient via [place].

## 2022-02-10 NOTE — Telephone Encounter (Signed)
Plenvu sample placed at reception on 2nd floor. Procedure on 02-19-22 [place]

## 2022-02-10 NOTE — Telephone Encounter (Signed)
Called and spoke to patient.  She said the pharmacy told her the discounted price of Suprep was $85 and she can't afford that. She requested a different prep that would be cheaper. Patient notified that we can provide a PLENVU sample for her and will send new instructions to her MyChart account. She expressed understanding and will pick up the sample this week on the 2nd floor.

## 2022-02-10 NOTE — Telephone Encounter (Signed)
Inbound call from patient requesting to speak with someone regarding prep. Patient has an upcoming procedure scheduled at [facility] on 02/19/22.  Thank you

## 2022-02-11 NOTE — Telephone Encounter (Signed)
Noted, thanks!

## 2022-02-12 ENCOUNTER — Encounter (HOSPITAL_COMMUNITY): Payer: Self-pay | Admitting: Gastroenterology

## 2022-02-17 ENCOUNTER — Encounter: Payer: Medicare HMO | Admitting: Gastroenterology

## 2022-02-19 ENCOUNTER — Ambulatory Visit (HOSPITAL_BASED_OUTPATIENT_CLINIC_OR_DEPARTMENT_OTHER): Payer: Medicare HMO | Admitting: Certified Registered Nurse Anesthetist

## 2022-02-19 ENCOUNTER — Other Ambulatory Visit: Payer: Self-pay

## 2022-02-19 ENCOUNTER — Encounter (HOSPITAL_COMMUNITY): Payer: Self-pay | Admitting: Gastroenterology

## 2022-02-19 ENCOUNTER — Ambulatory Visit (HOSPITAL_COMMUNITY)
Admission: RE | Admit: 2022-02-19 | Discharge: 2022-02-19 | Disposition: A | Discharge status: Home / Self Care | Payer: Medicare HMO | Source: Ambulatory Visit | Attending: Gastroenterology | Admitting: Gastroenterology

## 2022-02-19 ENCOUNTER — Ambulatory Visit (HOSPITAL_COMMUNITY): Payer: Medicare HMO | Admitting: Certified Registered Nurse Anesthetist

## 2022-02-19 ENCOUNTER — Encounter (HOSPITAL_COMMUNITY)
Admission: RE | Disposition: A | Discharge status: Home / Self Care | Payer: Self-pay | Source: Ambulatory Visit | Attending: Gastroenterology

## 2022-02-19 DIAGNOSIS — F32A Depression, unspecified: Secondary | ICD-10-CM | POA: Diagnosis not present

## 2022-02-19 DIAGNOSIS — K317 Polyp of stomach and duodenum: Secondary | ICD-10-CM | POA: Insufficient documentation

## 2022-02-19 DIAGNOSIS — D123 Benign neoplasm of transverse colon: Secondary | ICD-10-CM | POA: Insufficient documentation

## 2022-02-19 DIAGNOSIS — K3189 Other diseases of stomach and duodenum: Secondary | ICD-10-CM | POA: Diagnosis not present

## 2022-02-19 DIAGNOSIS — J449 Chronic obstructive pulmonary disease, unspecified: Secondary | ICD-10-CM | POA: Insufficient documentation

## 2022-02-19 DIAGNOSIS — K449 Diaphragmatic hernia without obstruction or gangrene: Secondary | ICD-10-CM

## 2022-02-19 DIAGNOSIS — K6389 Other specified diseases of intestine: Secondary | ICD-10-CM

## 2022-02-19 DIAGNOSIS — D126 Benign neoplasm of colon, unspecified: Secondary | ICD-10-CM | POA: Diagnosis not present

## 2022-02-19 DIAGNOSIS — Z860100 Personal history of colon polyps, unspecified: Secondary | ICD-10-CM

## 2022-02-19 DIAGNOSIS — Z09 Encounter for follow-up examination after completed treatment for conditions other than malignant neoplasm: Secondary | ICD-10-CM | POA: Diagnosis not present

## 2022-02-19 DIAGNOSIS — K2100 Gastro-esophageal reflux disease with esophagitis, without bleeding: Secondary | ICD-10-CM | POA: Insufficient documentation

## 2022-02-19 DIAGNOSIS — Z800 Family history of malignant neoplasm of digestive organs: Secondary | ICD-10-CM | POA: Diagnosis not present

## 2022-02-19 DIAGNOSIS — Z87891 Personal history of nicotine dependence: Secondary | ICD-10-CM | POA: Insufficient documentation

## 2022-02-19 DIAGNOSIS — Z79899 Other long term (current) drug therapy: Secondary | ICD-10-CM | POA: Insufficient documentation

## 2022-02-19 DIAGNOSIS — F418 Other specified anxiety disorders: Secondary | ICD-10-CM | POA: Diagnosis not present

## 2022-02-19 DIAGNOSIS — F419 Anxiety disorder, unspecified: Secondary | ICD-10-CM | POA: Insufficient documentation

## 2022-02-19 DIAGNOSIS — D175 Benign lipomatous neoplasm of intra-abdominal organs: Secondary | ICD-10-CM | POA: Insufficient documentation

## 2022-02-19 DIAGNOSIS — I10 Essential (primary) hypertension: Secondary | ICD-10-CM | POA: Diagnosis not present

## 2022-02-19 DIAGNOSIS — Z9981 Dependence on supplemental oxygen: Secondary | ICD-10-CM | POA: Diagnosis not present

## 2022-02-19 DIAGNOSIS — D122 Benign neoplasm of ascending colon: Secondary | ICD-10-CM | POA: Diagnosis not present

## 2022-02-19 DIAGNOSIS — K648 Other hemorrhoids: Secondary | ICD-10-CM | POA: Diagnosis not present

## 2022-02-19 DIAGNOSIS — D124 Benign neoplasm of descending colon: Secondary | ICD-10-CM | POA: Insufficient documentation

## 2022-02-19 DIAGNOSIS — Q438 Other specified congenital malformations of intestine: Secondary | ICD-10-CM | POA: Insufficient documentation

## 2022-02-19 DIAGNOSIS — K635 Polyp of colon: Secondary | ICD-10-CM | POA: Diagnosis not present

## 2022-02-19 DIAGNOSIS — K219 Gastro-esophageal reflux disease without esophagitis: Secondary | ICD-10-CM

## 2022-02-19 DIAGNOSIS — D509 Iron deficiency anemia, unspecified: Secondary | ICD-10-CM

## 2022-02-19 HISTORY — PX: BIOPSY: SHX5522

## 2022-02-19 HISTORY — PX: ESOPHAGOGASTRODUODENOSCOPY (EGD) WITH PROPOFOL: SHX5813

## 2022-02-19 HISTORY — PX: POLYPECTOMY: SHX5525

## 2022-02-19 HISTORY — PX: COLONOSCOPY WITH PROPOFOL: SHX5780

## 2022-02-19 SURGERY — COLONOSCOPY WITH PROPOFOL
Anesthesia: Monitor Anesthesia Care

## 2022-02-19 MED ORDER — LACTATED RINGERS IV SOLN
INTRAVENOUS | Status: AC | PRN
  Administered 2022-02-19: 1000 mL via INTRAVENOUS

## 2022-02-19 MED ORDER — PROPOFOL 10 MG/ML IV BOLUS
INTRAVENOUS | Status: DC | PRN
  Administered 2022-02-19 (×2): 10 mg via INTRAVENOUS
  Administered 2022-02-19: 30 mg via INTRAVENOUS
  Administered 2022-02-19 (×2): 20 mg via INTRAVENOUS

## 2022-02-19 MED ORDER — ONDANSETRON HCL 4 MG/2ML IJ SOLN
INTRAMUSCULAR | Status: DC | PRN
  Administered 2022-02-19: 4 mg via INTRAVENOUS

## 2022-02-19 MED ORDER — PROPOFOL 500 MG/50ML IV EMUL
INTRAVENOUS | Status: DC | PRN
  Administered 2022-02-19: 125 ug/kg/min via INTRAVENOUS

## 2022-02-19 MED ORDER — LIDOCAINE 2% (20 MG/ML) 5 ML SYRINGE
INTRAMUSCULAR | Status: DC | PRN
  Administered 2022-02-19: 60 mg via INTRAVENOUS

## 2022-02-19 MED ORDER — PROPOFOL 1000 MG/100ML IV EMUL
INTRAVENOUS | Status: AC
  Filled 2022-02-19: qty 100

## 2022-02-19 MED ORDER — SODIUM CHLORIDE 0.9 % IV SOLN: INTRAVENOUS | Status: DC

## 2022-02-19 SURGICAL SUPPLY — 25 items

## 2022-02-19 NOTE — H&P (Signed)
[facility] History and Physical   Primary Care Physician:  Donnajean Lopes, MD   Reason for Procedure:   History of colon polyps. GERD, reported IDA  Plan:    EGD and colonoscopy     HPI: Katie Greene is a 66 y.o. female  here for EGD to evaluate history of GERD, reported history of IDA, and colonoscopy for history of numerous colon polyps.  Patient denies any bowel symptoms at this time. Sister had colon cancer dx age 56. Otherwise feels well without any cardiopulmonary symptoms today. Her case is being done at the hospital for anesthesia support given her history of supplemental oxygen use for COPD, higher risk for anesthesia. I have discussed risks / benefits of these exams and anesthesia with her, she understands and wants to proceed. Further recommendations pending the results.    Past Medical History:  Diagnosis Date   Allergy    Anxiety    Arthritis    Chest pain    Improved   Chronic respiratory failure (HCC)    COPD (chronic obstructive pulmonary disease) (HCC)    Stage IV, oxygen dependant, triology ventilator   Depression    Emphysema of lung (HCC)    Fatigue    GERD (gastroesophageal reflux disease)    Hemorrhoids    History of colon polyps    Hyperlipidemia    Hypertension    Iron deficiency anemia    On home oxygen therapy    oxygen therapy 2-3 L/m nasally 24/7   Oxygen deficiency    Palpitations    Pulmonary nodules    Seasonal allergies    Wears dentures    Wears glasses     Past Surgical History:  Procedure Laterality Date   CHOLECYSTECTOMY     laparosopic attempted, then had to open   COLONOSCOPY     x2 with polyps removed   COLONOSCOPY N/A 08/27/2015   Procedure: COLONOSCOPY;  Surgeon: Manus Gunning, MD;  Location: [facility];  Service: Gastroenterology;  Laterality: N/A;   COLONOSCOPY WITH PROPOFOL N/A 01/17/2019   Procedure: COLONOSCOPY WITH PROPOFOL;  Surgeon: Yetta Flock, MD;  Location: [facility];   Service: Gastroenterology;  Laterality: N/A;   oopherectomy     1 ovary removed only   POLYPECTOMY     POLYPECTOMY  01/17/2019   Procedure: POLYPECTOMY;  Surgeon: Yetta Flock, MD;  Location: [facility];  Service: Gastroenterology;;   SALPINGECTOMY     1 fallopian tube removed    Prior to Admission medications   Medication Sig Start Date End Date Taking? Authorizing Provider  Acetylcysteine (N-ACETYL-L-CYSTEINE) 600 MG CAPS Take 600 mg by mouth 2 (two) times a day.    Yes [provider]  albuterol (PROVENTIL) (2.5 MG/3ML) 0.083% nebulizer solution Take 3 mLs (2.5 mg total) by nebulization every 4 (four) hours as needed for wheezing or shortness of breath. Dx code j44.9 01/05/22  Yes Brand Males, MD  albuterol (VENTOLIN HFA) 108 (90 Base) MCG/ACT inhaler INHALE 1 TO 2 PUFFS EVERY 6 HOURS AS NEEDED Patient taking differently: Inhale 2 puffs into the lungs every 4 (four) hours as needed for shortness of breath. 12/15/21  Yes Brand Males, MD  ALPRAZolam Duanne Moron) 0.5 MG tablet Take 0.5 mg by mouth 3 (three) times daily as needed for anxiety. 05/09/15  Yes [provider]  AMBULATORY NON FORMULARY MEDICATION at bedtime. VENTILATOR machine at night   Yes [provider]  aspirin EC 81 MG tablet Take 81 mg by mouth  daily.    Yes [provider]  BREZTRI AEROSPHERE 160-9-4.8 MCG/ACT AERO INHALE 2 PUFFS INTO THE LUNGS IN THE MORNING AND AT BEDTIME. 12/03/21  Yes Brand Males, MD  cetirizine (ZYRTEC) 10 MG tablet Take 10 mg by mouth daily.    Yes [provider]  cholecalciferol (VITAMIN D) 25 MCG (1000 UNIT) tablet Take 1,000 Units by mouth daily.   Yes [provider]  diltiazem (CARDIZEM CD) 180 MG 24 hr capsule Take 180 mg by mouth daily.    Yes [provider]  ferrous sulfate 325 (65 FE) MG tablet Take 325 mg by mouth daily with breakfast.   Yes [provider]  fluticasone (FLONASE) 50 MCG/ACT nasal spray  Place 1 spray into both nostrils at bedtime. Patient taking differently: Place 2 sprays into both nostrils at bedtime. 08/13/21  Yes Brand Males, MD  hydrochlorothiazide (HYDRODIURIL) 25 MG tablet Take 25 mg by mouth daily.    Yes [provider]  metoprolol succinate (TOPROL-XL) 50 MG 24 hr tablet Take 50 mg by mouth daily before breakfast. Take with or immediately following a meal.   Yes [provider]  nitroGLYCERIN (NITROSTAT) 0.4 MG SL tablet Place 0.4 mg under the tongue every 5 (five) minutes as needed for chest pain. 07/31/20  Yes [provider]  omeprazole (PRILOSEC) 20 MG capsule Take 20 mg by mouth daily.  05/08/15  Yes [provider]  OXYGEN Inhale 2 L into the lungs continuous.   Yes [provider]  pravastatin (PRAVACHOL) 80 MG tablet Take 80 mg by mouth every morning.    Yes [provider]  temazepam (RESTORIL) 30 MG capsule Take 30 mg by mouth at bedtime. 04/22/18  Yes [provider]  spironolactone (ALDACTONE) 50 MG tablet Take 25 mg by mouth daily.     [provider]    Current Facility-Administered Medications  Medication Dose Route Frequency Provider Last Rate Last Admin   0.9 %  sodium chloride infusion   Intravenous Continuous Gaetano Romberger, Carlota Raspberry, MD       lactated ringers infusion    Continuous PRN Havery Moros, Carlota Raspberry, MD   1,000 mL at 02/19/22 0924    Allergies as of 02/10/2022 - Review Complete 01/27/2022  Allergen Reaction Noted   Daliresp [roflumilast] Diarrhea, Nausea And Vomiting, and Other (See Comments) 01/26/2018    Family History  Problem Relation Age of Onset   Heart attack Mother    Pancreatic cancer Mother    Prostate cancer Father    Hypertension Father    Throat cancer Father    Colon cancer Sister    Pancreatic cancer Sister    Brain cancer Sister    Throat cancer Brother    Prostate cancer Brother    Stomach cancer Neg Hx    Colon polyps Neg Hx    Rectal  cancer Neg Hx    Esophageal cancer Neg Hx     Social History   Socioeconomic History   Marital status: Divorced    Spouse name: Not on file   Number of children: 3   Years of education: Not on file   Highest education level: Not on file  Occupational History   Occupation: Retired     Comment: HVAC   Tobacco Use   Smoking status: Former    Packs/day: 0.50    Years: 25.00    Total pack years: 12.50    Types: Cigarettes    Start date: 1970    Quit  date: 03/02/2012    Years since quitting: 9.9   Smokeless tobacco: Never  Vaping Use   Vaping Use: Never used  Substance and Sexual Activity   Alcohol use: No   Drug use: No   Sexual activity: Not on file  Other Topics Concern   Not on file  Social History Narrative   Not on file   Social Determinants of Health   Financial Resource Strain: Not on file  Food Insecurity: Not on file  Transportation Needs: Not on file  Physical Activity: Not on file  Stress: Not on file  Social Connections: Not on file  Intimate Partner Violence: Not on file    Review of Systems: All other review of systems negative except as mentioned in the HPI.  Physical Exam: Vital signs BP 133/73   Temp 98.3 F (36.8 C) (Temporal)   Resp 20   Ht '5\' 7"'$  (1.702 m)   Wt 77.6 kg   SpO2 99%   BMI 26.78 kg/m   General:   Alert,  Well-developed, pleasant and cooperative in NAD Lungs:  Clear throughout to auscultation.   Heart:  Regular rate and rhythm Abdomen:  Soft, nontender and nondistended.   Neuro/Psych:  Alert and cooperative. Normal mood and affect. A and O x 3  Jolly Mango, MD [facility]

## 2022-02-19 NOTE — Anesthesia Preprocedure Evaluation (Addendum)
Anesthesia Evaluation  Patient identified by MRN, date of birth, ID band Patient awake    Reviewed: Allergy & Precautions, NPO status , Patient's Chart, lab work & pertinent test results  Airway Mallampati: I  TM Distance: >3 FB Neck ROM: Full    Dental  (+) Upper Dentures, Lower Dentures   Pulmonary COPD,  COPD inhaler, former smoker,    breath sounds clear to auscultation       Cardiovascular hypertension, Pt. on medications and Pt. on home beta blockers  Rhythm:Regular Rate:Normal     Neuro/Psych PSYCHIATRIC DISORDERS Anxiety Depression    GI/Hepatic Neg liver ROS, GERD  Medicated,  Endo/Other  negative endocrine ROS  Renal/GU negative Renal ROS     Musculoskeletal  (+) Arthritis ,   Abdominal Normal abdominal exam  (+)   Peds  Hematology negative hematology ROS (+)   Anesthesia Other Findings   Reproductive/Obstetrics                            Anesthesia Physical Anesthesia Plan  ASA: 3  Anesthesia Plan: MAC   Post-op Pain Management:    Induction: Intravenous  PONV Risk Score and Plan: 0 and Propofol infusion  Airway Management Planned: Natural Airway and Simple Face Mask  Additional Equipment: None  Intra-op Plan:   Post-operative Plan:   Informed Consent: I have reviewed the patients History and Physical, chart, labs and discussed the procedure including the risks, benefits and alternatives for the proposed anesthesia with the patient or authorized representative who has indicated his/her understanding and acceptance.       Plan Discussed with: CRNA  Anesthesia Plan Comments:        Anesthesia Quick Evaluation

## 2022-02-19 NOTE — Transfer of Care (Signed)
Immediate Anesthesia Transfer of Care Note  Patient: Katie Greene  Procedure(s) Performed: COLONOSCOPY WITH PROPOFOL ESOPHAGOGASTRODUODENOSCOPY (EGD) WITH PROPOFOL BIOPSY POLYPECTOMY  Patient Location: PACU and Endoscopy Unit  Anesthesia Type:MAC  Level of Consciousness: awake, alert  and oriented  Airway & Oxygen Therapy: Patient Spontanous Breathing and Patient connected to face mask oxygen  Post-op Assessment: Report given to RN and Post -op Vital signs reviewed and stable  Post vital signs: Reviewed and stable  Last Vitals:  Vitals Value Taken Time  BP    Temp    Pulse 103 02/19/22 1039  Resp 29 02/19/22 1039  SpO2 100 % 02/19/22 1039  Vitals shown include unvalidated device data.  Last Pain:  Vitals:   02/19/22 0913  TempSrc: Temporal  PainSc: 0-No pain         Complications: No notable events documented.

## 2022-02-19 NOTE — Anesthesia Postprocedure Evaluation (Signed)
Anesthesia Post Note  Patient: Katie Greene  Procedure(s) Performed: COLONOSCOPY WITH PROPOFOL ESOPHAGOGASTRODUODENOSCOPY (EGD) WITH PROPOFOL BIOPSY POLYPECTOMY     Patient location during evaluation: PACU Anesthesia Type: MAC Level of consciousness: awake and alert Pain management: pain level controlled Vital Signs Assessment: post-procedure vital signs reviewed and stable Respiratory status: spontaneous breathing, nonlabored ventilation, respiratory function stable and patient connected to nasal cannula oxygen Cardiovascular status: stable and blood pressure returned to baseline Postop Assessment: no apparent nausea or vomiting Anesthetic complications: no   No notable events documented.  Last Vitals:  Vitals:   02/19/22 1051 02/19/22 1100  BP: 115/65 122/79  Pulse: 94 100  Resp: 17 (!) 23  Temp:    SpO2: 98% 94%    Last Pain:  Vitals:   02/19/22 1100  TempSrc:   PainSc: 0-No pain                 Effie Berkshire

## 2022-02-19 NOTE — Op Note (Signed)
[facility] Patient Name: Katie Greene Procedure Date: 02/19/2022 MRN: [id number] Attending MD: Carlota Raspberry. Havery Moros , MD Date of Birth: 02-08-1956 CSN: [id number] Age: 66 Admit Type: Outpatient Procedure:                Colonoscopy Indications:              High risk colon cancer surveillance: Personal                            history of colonic polyps - last exam 01/2019 - 7                            adenomas Providers:                Remo Lipps P. Havery Moros, MD, Truddie Coco, RN, Gloris Ham, Technician Referring MD:              Medicines:                Monitored Anesthesia Care Complications:            No immediate complications. Estimated blood loss:                            Minimal. Estimated Blood Loss:     Estimated blood loss was minimal. Procedure:                Pre-Anesthesia Assessment:                           - Prior to the procedure, a History and Physical                            was performed, and patient medications and                            allergies were reviewed. The patient's tolerance of                            previous anesthesia was also reviewed. The risks                            and benefits of the procedure and the sedation                            options and risks were discussed with the patient.                            All questions were answered, and informed consent                            was obtained. Prior Anticoagulants: The patient has                            taken no previous  anticoagulant or antiplatelet                            agents. ASA Grade Assessment: III - A patient with                            severe systemic disease. After reviewing the risks                            and benefits, the patient was deemed in                            satisfactory condition to undergo the procedure.                           After obtaining informed consent, the  colonoscope                            was passed under direct vision. Throughout the                            procedure, the patient's blood pressure, pulse, and                            oxygen saturations were monitored continuously. The                            CF-HQ190L ([id number]) Olympus colonoscope was                            introduced through the anus and advanced to the the                            cecum, identified by appendiceal orifice and                            ileocecal valve. The colonoscopy was performed                            without difficulty. The patient tolerated the                            procedure well. The quality of the bowel                            preparation was good. The ileocecal valve,                            appendiceal orifice, and rectum were photographed. Scope In: 10:06:35 AM Scope Out: 10:34:02 AM Scope Withdrawal Time: 0 hours 20 minutes 3 seconds  Total Procedure Duration: 0 hours 27 minutes 27 seconds  Findings:      The perianal and digital rectal examinations were normal.      A 10 mm polyp was found in the ascending colon. The polyp  was sessile.       The polyp was removed with a cold snare. Resection and retrieval were       complete.      There was a medium-sized lipoma, in the ascending colon.      A 3 mm polyp was found in the transverse colon. The polyp was sessile.       The polyp was removed with a cold snare. Resection and retrieval were       complete.      A 3 mm polyp was found in the descending colon. The polyp was sessile.       The polyp was removed with a cold snare. Resection and retrieval were       complete.      Two sessile polyps were found in the sigmoid colon. The polyps were 3 mm       in size. These polyps were removed with a cold snare. Resection and       retrieval were complete.      The colon revealed excessive looping. Adult colonoscope used to complete       this exam (prior exams have  had long cecal intubations due to this issue       using pediatric colonoscope).      Internal hemorrhoids were found during retroflexion.      The exam was otherwise without abnormality. Impression:               - One 10 mm polyp in the ascending colon, removed                            with a cold snare. Resected and retrieved.                           - Medium-sized lipoma in the ascending colon.                           - One 3 mm polyp in the transverse colon, removed                            with a cold snare. Resected and retrieved.                           - One 3 mm polyp in the descending colon, removed                            with a cold snare. Resected and retrieved.                           - Two 3 mm polyps in the sigmoid colon, removed                            with a cold snare. Resected and retrieved.                           - There was significant looping of the colon. Adult  colonoscope used to complete this exam.                           - Internal hemorrhoids.                           - The examination was otherwise normal. Moderate Sedation:      No moderate sedation, case performed with MAC Recommendation:           - Patient has a contact number available for                            emergencies. The signs and symptoms of potential                            delayed complications were discussed with the                            patient. Return to normal activities tomorrow.                            Written discharge instructions were provided to the                            patient.                           - Resume previous diet.                           - Continue present medications.                           - Await pathology results. Procedure Code(s):        --- Professional ---                           [phone], Colonoscopy, flexible; with removal of                            tumor(s), polyp(s), or other  lesion(s) by snare                            technique Diagnosis Code(s):        --- Professional ---                           K63.5, Polyp of colon                           Z86.010, Personal history of colonic polyps                           D17.5, Benign lipomatous neoplasm of                            intra-abdominal organs  K64.8, Other hemorrhoids CPT copyright 2019 American Medical Association. All rights reserved. The codes documented in this report are preliminary and upon coder review may  be revised to meet current compliance requirements. Remo Lipps P. Havery Moros, MD 02/19/2022 10:43:18 AM This report has been signed electronically. Number of Addenda: 0

## 2022-02-19 NOTE — Op Note (Signed)
[facility] Patient Name: Katie Greene Procedure Date: 02/19/2022 MRN: [id number] Attending MD: Carlota Raspberry. Havery Moros , MD Date of Birth: 1956/02/08 CSN: [id number] Age: 66 Admit Type: Outpatient Procedure:                Upper GI endoscopy Indications:              Follow-up of gastro-esophageal reflux disease, on                            omeprazole 9m / day, reported history of iron                            deficiency Providers:                SCarlota Raspberry AHavery Moros MD, DTruddie Coco RN, CGloris Ham Technician Referring MD:              Medicines:                Monitored Anesthesia Care Complications:            No immediate complications. Estimated blood loss:                            Minimal. Estimated Blood Loss:     Estimated blood loss was minimal. Procedure:                Pre-Anesthesia Assessment:                           - Prior to the procedure, a History and Physical                            was performed, and patient medications and                            allergies were reviewed. The patient's tolerance of                            previous anesthesia was also reviewed. The risks                            and benefits of the procedure and the sedation                            options and risks were discussed with the patient.                            All questions were answered, and informed consent                            was obtained. Prior Anticoagulants: The patient has                            taken no previous  anticoagulant or antiplatelet                            agents. ASA Grade Assessment: III - A patient with                            severe systemic disease. After reviewing the risks                            and benefits, the patient was deemed in                            satisfactory condition to undergo the procedure.                           After obtaining informed consent,  the endoscope was                            passed under direct vision. Throughout the                            procedure, the patient's blood pressure, pulse, and                            oxygen saturations were monitored continuously. The                            [id number]-[id number] ([id number] Olympus endoscope was introduced                            through the mouth, and advanced to the second part                            of duodenum. The upper GI endoscopy was                            accomplished without difficulty. The patient                            tolerated the procedure well. Scope In: Scope Out: Findings:      Esophagogastric landmarks were identified: the Z-line was found at 39       cm, the gastroesophageal junction was found at 39 cm and the upper       extent of the gastric folds was found at 41 cm from the incisors.      A 2 cm hiatal hernia was present.      LA Grade A esophagitis was found at the GEJ.      The exam of the esophagus was otherwise normal.      A deformity was found in the gastric body. It was markedly J shaped.       There was a suspected possible paraesophageal hernia noted (although not       seen on recent CT imaging), vs normal variant.      The exam of the stomach was otherwise normal.  A single 3 mm sessile polyp was found in the duodenal bulb. The polyp       was removed with a cold biopsy forceps. Resection and retrieval were       complete.      The exam of the duodenum was otherwise normal. Impression:               - Esophagogastric landmarks identified.                           - 2 cm hiatal hernia.                           - LA Grade A reflux esophagitis.                           - Deformity in the gastric body.                           - A single duodenal polyp. Resected and retrieved. Moderate Sedation:      No moderate sedation, case performed with MAC Recommendation:           - Patient has a contact number available for                             emergencies. The signs and symptoms of potential                            delayed complications were discussed with the                            patient. Return to normal activities tomorrow.                            Written discharge instructions were provided to the                            patient.                           - Resume previous diet.                           - Continue present medications.                           - Await pathology results.                           - Increase omeprazole to twice daily dosing given                            esophagitis noted on this exam Procedure Code(s):        --- Professional ---                           [id number], Esophagogastroduodenoscopy, flexible,  transoral; with biopsy, single or multiple Diagnosis Code(s):        --- Professional ---                           K44.9, Diaphragmatic hernia without obstruction or                            gangrene                           K21.00, Gastro-esophageal reflux disease with                            esophagitis, without bleeding                           K31.89, Other diseases of stomach and duodenum                           K31.7, Polyp of stomach and duodenum CPT copyright 2019 American Medical Association. All rights reserved. The codes documented in this report are preliminary and upon coder review may  be revised to meet current compliance requirements. Remo Lipps P. Havery Moros, MD 02/19/2022 10:53:23 AM This report has been signed electronically. Number of Addenda: 0

## 2022-02-19 NOTE — Anesthesia Procedure Notes (Signed)
Procedure Name: MAC Date/Time: 02/19/2022 9:50 AM  Performed by: Maxwell Caul, CRNAPre-anesthesia Checklist: Patient identified, Emergency Drugs available, Suction available and Patient being monitored Oxygen Delivery Method: Simple face mask

## 2022-02-19 NOTE — Discharge Instructions (Signed)
YOU HAD AN ENDOSCOPIC PROCEDURE TODAY: Refer to the procedure report and other information in the discharge instructions given to you for any specific questions about what was found during the examination. If this information does not answer your questions, please call [place] office at [phone] to clarify.  ° °YOU SHOULD EXPECT: Some feelings of bloating in the abdomen. Passage of more gas than usual. Walking can help get rid of the air that was put into your GI tract during the procedure and reduce the bloating. If you had a lower endoscopy (such as a colonoscopy or flexible sigmoidoscopy) you may notice spotting of blood in your stool or on the toilet paper. Some abdominal soreness may be present for a day or two, also. ° °DIET: Your first meal following the procedure should be a light meal and then it is ok to progress to your normal diet. A half-sandwich or bowl of soup is an example of a good first meal. Heavy or fried foods are harder to digest and may make you feel nauseous or bloated. Drink plenty of fluids but you should avoid alcoholic beverages for 24 hours. If you had a esophageal dilation, please see attached instructions for diet.   ° °ACTIVITY: Your care partner should take you home directly after the procedure. You should plan to take it easy, moving slowly for the rest of the day. You can resume normal activity the day after the procedure however YOU SHOULD NOT DRIVE, use power tools, machinery or perform tasks that involve climbing or major physical exertion for 24 hours (because of the sedation medicines used during the test).  ° °SYMPTOMS TO REPORT IMMEDIATELY: °A gastroenterologist can be reached at any hour. Please call [phone]  for any of the following symptoms:  °Following lower endoscopy (colonoscopy, flexible sigmoidoscopy) °Excessive amounts of blood in the stool  °Significant tenderness, worsening of abdominal pains  °Swelling of the abdomen that is new, acute  °Fever of 100° or  higher  °Following upper endoscopy (EGD, EUS, ERCP, esophageal dilation) °Vomiting of blood or coffee ground material  °New, significant abdominal pain  °New, significant chest pain or pain under the shoulder blades  °Painful or persistently difficult swallowing  °New shortness of breath  °Black, tarry-looking or red, bloody stools ° °FOLLOW UP:  °If any biopsies were taken you will be contacted by phone or by letter within the next 1-3 weeks. Call [phone]  if you have not heard about the biopsies in 3 weeks.  °Please also call with any specific questions about appointments or follow up tests. ° °

## 2022-02-20 LAB — SURGICAL PATHOLOGY

## 2022-02-23 ENCOUNTER — Encounter (HOSPITAL_COMMUNITY): Payer: Self-pay | Admitting: Gastroenterology

## 2022-03-03 DIAGNOSIS — H1013 Acute atopic conjunctivitis, bilateral: Secondary | ICD-10-CM | POA: Diagnosis not present

## 2022-03-03 DIAGNOSIS — J449 Chronic obstructive pulmonary disease, unspecified: Secondary | ICD-10-CM | POA: Diagnosis not present

## 2022-03-03 DIAGNOSIS — J439 Emphysema, unspecified: Secondary | ICD-10-CM | POA: Diagnosis not present

## 2022-03-03 DIAGNOSIS — I10 Essential (primary) hypertension: Secondary | ICD-10-CM | POA: Diagnosis not present

## 2022-03-03 DIAGNOSIS — I2781 Cor pulmonale (chronic): Secondary | ICD-10-CM | POA: Diagnosis not present

## 2022-03-03 DIAGNOSIS — Z9981 Dependence on supplemental oxygen: Secondary | ICD-10-CM | POA: Diagnosis not present

## 2022-04-01 DIAGNOSIS — Z1231 Encounter for screening mammogram for malignant neoplasm of breast: Secondary | ICD-10-CM | POA: Diagnosis not present

## 2022-04-02 ENCOUNTER — Telehealth: Payer: Self-pay | Admitting: Internal Medicine

## 2022-04-02 NOTE — Telephone Encounter (Signed)
Patient is calling to ask the nurse to call her to find out why her CT scan was changed to [facility] after so many years.  She said she did not know that it was going to be at [facility].  Please advise and call patient to discuss at [phone].

## 2022-04-22 ENCOUNTER — Ambulatory Visit (INDEPENDENT_AMBULATORY_CARE_PROVIDER_SITE_OTHER): Payer: Medicare HMO

## 2022-04-22 ENCOUNTER — Encounter (INDEPENDENT_AMBULATORY_CARE_PROVIDER_SITE_OTHER): Payer: Medicare HMO | Admitting: Internal Medicine

## 2022-04-22 ENCOUNTER — Encounter: Payer: Medicare HMO | Admitting: *Deleted

## 2022-04-22 DIAGNOSIS — Z006 Encounter for examination for normal comparison and control in clinical research program: Secondary | ICD-10-CM

## 2022-04-22 DIAGNOSIS — J449 Chronic obstructive pulmonary disease, unspecified: Secondary | ICD-10-CM | POA: Diagnosis not present

## 2022-04-22 DIAGNOSIS — J9811 Atelectasis: Secondary | ICD-10-CM | POA: Diagnosis not present

## 2022-04-22 DIAGNOSIS — J441 Chronic obstructive pulmonary disease with (acute) exacerbation: Secondary | ICD-10-CM

## 2022-04-22 NOTE — Research (Signed)
Title: A Phase III, randomized, double-blind, placebo controlled, multicenter study to evaluate the efficacy and safety of astegolimab in patients with chronic obstructive pulmonary disease    Dose and Duration of Treatment: Astegolimab is presented as a sterile, slightly brown-yellow solution. Each single-use, 2.25 mL pre-filled syringe contains 1.7 mL deliverable volume. Astegolimab drug product is formulated at 140 mg/mL astegolimab with 114 mM succinic acid, 200 mM L-arginine, 10 mM L-methionine, 0.06% (w/v) polysorbate 20, pH 5.7. Placebo for astegolimab is supplied in an identical pre-filled syringe configuration.   Protocol # [id number]   Sponsor: [employer] [place], [place]    Psychologist, forensic Version: Version 7, dated April 2022  below information reflects this IB - Youth worker Product: Astegolimab [phone])   Mechanism of action: Astegolimab (also known as [id number] or [id number]) is a fully human, IgG2 monoclonal antibody that binds with high affinity to the interleukin (IL)-33 (IL-33) receptor, ST2, thereby blocking the signaling of IL-33, an inflammatory cytokine of the IL-1 family and member of the "alarmin" class of molecules. Astegolimab binds with high affinity to the human and cynomolgus monkey receptor for IL-33, ST2, and blocks IL-33 binding, thus inhibiting association with the IL-1R accessory protein (AcP) co-receptor and formation of an activated receptor complex.   Key Inclusion Criteria: Age 7-80 years at Visit 1   Documented COPD diagnosis for ?12 months prior to Visit 1 History of frequent exacerbations, defined as having had 2 or more moderate or severe COPD exacerbations within 12 months prior to Visit 1 Post-bronchodilator FEV1 ?20% and <80% of predicted at Visit 1 or Visit 2  Post-bronchodilator FEV1/FVC <0.70 at Visit 1 or Visit 2  mMRC score ?2 at screening   Current tobacco smoker or former smoker  (having stopped smoking for at least 6 months prior to Visit 1) with a history of smoking ?10 pack-years On optimized COPD maintenance therapy as defined below for ?12 months prior to Visit 1            -Inhaled corticosteroid (ICS) plus long-acting beta-agonist (LABA)           -Long-acting muscarinic antagonist (LAMA) plus LABA           -ICS plus LAMA plus LABA  Key Exclusion Criteria: Current documented diagnosis of asthma  Diagnosis of a-1 antitrypsin deficiency  History of long-term treatment with oxygen at >4.0 liters/minute  Any infection that resulted in hospital admission for ?24 hours and/or treatment with oral, IV, or IM antibiotics within 4 weeks prior to Visit 1 or during screening Upper or lower respiratory tract infection within 4 weeks prior to Visit 1 or during screening Treatment with oral, IV, or IM corticosteroids (>10 mg/day prednisolone or equivalent) within 4 weeks prior to initiation of study drug Treatment with a licensed biologic agent (e.g., omalizumab, dupilumab, and/or anti-IL-5 therapies) within 3 months or 5 drug-elimination half-lives (whichever is longer) prior to screening  Planned surgical intervention during the study Known immunodeficiency, including but not limited to, HIV infection  AST, ALT, or total bilirubin elevation ?2.0 x the upper limit of normal (ULN) during screening  History of malignancy within 5 years prior to screening, with the exception of malignancies with a negligible risk of metastasis or death (e.g., 5-year overall survival rate >90%), such as adequately treated carcinoma in situ of the cervix, non-melanoma carcinoma, localized prostate cancer, or ductal carcinoma in situ Unstable cardiac disease, myocardial infarction, or New York Heart Association Class III or  IV heart failure within 12 months prior to screening History or absence of an abnormal ECG that is deemed clinically significant by the investigator, including complete left bundle  branch block or second- or third-degree atrioventricular heart block     Integrated Pharmacokinetic/Pharmacodynamic Analysis:  In Studies OF75102, HE52778, and EU23536, exploratory biomarker analysis showed there were consistent decreases in blood eosinophil counts throughout the treatment period, potentially mediated by a direct effect of IL-33 on eosinophil progenitors. In [place], there was no significant difference in fractional exhaled nitric oxide (FeNO) levels (reflecting airway IL-4/IL-13 activity) between astegolimab-treated groups relative to placebo throughout the treatment period. These data suggest that astegolimab has only a limited effect on Type 2 inflammation in asthma. No data are applicable for Study [id number]. No data are available yet for Study [id number].  Special Warnings/Considerations: Administration of astegolimab, a protein therapeutic, may lead to the development of anti-astegolimab antibodies which could lead to AEs and/or decreased exposure. In non-clinical studies (see Section 4.2), ADA incidence has generally been low and there was no apparent ADA impact on PK and safety in these studies. To date, the immunogenicity rates observed with astegolimab in clinical studies have been relatively low as well (see Immunogenicity Section 5.6). Several clinical studies have been conducted in patients with asthma, atopic dermatitis, COPD (IIS Study PJ09326) and COVID-19 severe pneumonia, which generally showed low incidence of ADAs (Table 31). There has been no correlation between ADA status and clinical findings or increased incidence of AEs. Astegolimab is now being considered in a larger study for the treatment of COPD. This patient population is typically considered to have a hyper-responsive immune system. Route of administration for this molecule will be [place], either Q2W or Q4W. These factors increase the risk of development of an immune response to astegolimab, specifically with repeat  dosing. From the previous IIS Study [id number], incidence of ADAs was low in COPD patients; this remains to be confirmed in a larger study. To monitor ADA development in ongoing studies, serum samples will be collected from patients at protocol-defined intervals. Patients who test positive for antibodies and have clinical sequelae that are considered potentially related to an ADA response may also be asked to return for additional follow-up testing.  Drug Interaction Studies: No PK drug interaction studies have been conducted to date.  Serious Adverse Reactions Observed in Asthma Study: During the treatment period, a similar proportion of patients across all cohorts experienced at least one AE, regardless of causality (Table 22). In total, 77.2%, 70.9%, 72.2%, and 72.1% of patients reported at least 1 AE in the placebo, 70 mg, 210 mg, and 490 mg groups, respectively. The most common AE's (>5% in any treatment group) were asthma, nasopharyngitis, upper respiratory tract infection, headache, and injection site reaction (ISR). The most common drug-related AE was ISR, which was reported more frequently in the astegolimab treatment groups than in the placebo group (1 [0.8%] patient in the placebo group, 10 [7.9%] patients with 70 mg, 8 [6.3%] patients with 210 mg, 6 [4.9%] patients with 490 mg). All ISRs were non-serious and mild or moderate in severity. During the treatment period, 50 SAEs were reported in 37 (7.4%) patients. The number of patients reporting SAEs was comparable across all cohorts (11 SAEs in 8 patients on placebo, 21 SAEs in 14 patients on 70 mg, 11 SAEs in 9 patients on 210 mg, and 7 SAEs in 6 patients on 490 mg). The most common SAE was asthma. One SAE of moderate livedo reticularis (70 mg)  was considered a suspected unexpected serious adverse drug reaction (SUSAR) related to astegolimab and was reported two days after the second dose, leading to discontinuation of astegolimab. Two (0.4%) patients  reported anaphylaxis and hypersensitivity reactions: 1 severe SAE of anaphylactic reaction (placebo), and 1 moderate hypersensitivity (490 mg) considered related to astegolimab. Three (0.6%) patients experienced a potential Major Adverse Cardiac Event (MACE) (1 patient each in the placebo, 70 mg, and 210 mg groups). None of the potential MACE were considered related to astegolimab. Overall, 233 (46.4%) patients reported events of infection. A comparable number of patients reported infection across all treatment groups (65 [51.2%] patients on placebo, 55 [43.3%] patients on 70 mg, 58 [46.0%] patients on 210 mg, and 55 [45.1%] patients on 490 mg). The most frequently reported infection (?10% incidence) was nasopharyngitis (12.7%). One patient on astegolimab 210 mg and the partner of one patient on placebo became pregnant during the study. Both delivered normal/healthy babies. Two deaths, unrelated to study drug, were reported: one patient on 210 mg astegolimab died following an SAE of asthma; the other patient on 490 mg astegolimab had an unexplained death. There were no clinically meaningful changes in laboratory parameters, vital signs, or ECG results, other than the 10% decrease in mean blood eosinophil counts in the astegolimab-treated groups, with no safety concerns. Treatment-induced ADAs were comparable between the astegolimab groups and had no impact on safety. Overall, astegolimab was well tolerated at all doses used and had a safety profile consistent with that observed in the previous astegolimab Phase I studies.  Serious Adverse Reactions Observed in Previous COPD Study: In the completed IIS Study [id number], a total of 81 patients received at least one dose of astegolimab or placebo. The safety profile of astegolimab was similar to that of placebo. There were a total of 222 AEs reported in 62 patients. A total of 28 (72%) patients in the placebo arm and 34 (81%) patients in the astegolimab arm reported  at least one AE. The most commonly reported AEs were headache followed by urinary tract infection and viral upper respiratory tract infection. A total of 39 SAEs were reported in 28 (35%) patients; 16 (41%) patients in the placebo group and 12 (29%) in the astegolimab group. The most commonly reported SAE was hospital admission for community acquired pneumonia. Four SAEs resulted in patient discontinuation from treatment (1 patient in the placebo group and 3 patients in the astegolimab group). One patient in the placebo group experienced an AESI of potential MACE (heart failure, unrelated to the study treatment). No anaphylaxis or pregnancies were reported. Two deaths, unrelated to study treatment, were reported: 1 patient in the placebo group died after hospital acquired pneumonia, and another patient in the placebo group died after pneumonia and type 2 respiratory failure.  Safety Data: Astegolimab has been generally well tolerated. There have been 61 patient deaths across all astegolimab studies (Section 5.5.2), none of which were considered related to astegolimab. A total of 144 subjects experienced a total of 224 SAEs across all astegolimab studies. Of these, an SAE livedo reticularis observed in 1 patient was considered related to astegolimab by the investigator (Section 5.5.3). AEs leading to withdrawal (Section 5.5.4) have generally occurred at a rate similar to what would be expected for clinical trials in the studies' respective indications.    [facility] [place] Coordinator note :   This visit for Subject Katie Greene with DOB: 03/03/1956 on 04/22/2022 for the above protocol is Visit/Encounter # Visit 1  and is for purpose of research.   The consent for this encounter is under Protocol: Version 2, dated [id number] IB: version 7 dated April 2022 ICF: version 12Apr2023, revised [phone] and is currently IRB approved.   Subject expressed continued interest and consent in  continuing as a study subject. Subject confirmed that there was  no change in contact information (e.g. address, telephone, email). Subject thanked for participation in research and contribution to science.  In this visit 04/22/2022 the subject will be evaluated by  Principal Investigator named Brand Males, MD  . This research coordinator has verified that the above investigator is up to date with his/her training logs.  This visit is a key visit of  screening. The PI is available for this visit.    Subject signed consent prior to any and all study related procedures and assessments were conducted. Refer to the subjects paper source binder for further documentation of the consent process.   All procedures and assessments were completed per the above mentioned protocol. Refer to the subjects paper source binder for results of the assessments and further details of the visit.    Signed by Cartwright Bing, BS, CMA, [place] Coordinator [place], [place] 2:38 PM 04/22/2022

## 2022-04-27 ENCOUNTER — Telehealth: Payer: Self-pay | Admitting: Internal Medicine

## 2022-04-27 DIAGNOSIS — Z2911 Encounter for prophylactic immunotherapy for respiratory syncytial virus (RSV): Secondary | ICD-10-CM

## 2022-04-27 NOTE — Telephone Encounter (Signed)
Patient would like RSV script sent to [facility] on [address]. Please call patient with update- [phone].

## 2022-04-28 MED ORDER — AREXVY 120 MCG/0.5ML IM SUSR: 0.5000 mL | Freq: Once | INTRAMUSCULAR | 0 refills | Status: AC

## 2022-04-28 NOTE — Telephone Encounter (Signed)
Called and spoke with patient.  Patient is requesting RSV vaccine prescription to be sent to [facility] [address].  Per LOV note with Dr. Chase Caller patient was instructed to get RSV vaccine this fall when available.  [facility]. Nothing further at this time.

## 2022-04-29 ENCOUNTER — Ambulatory Visit: Payer: Medicare HMO

## 2022-05-01 ENCOUNTER — Ambulatory Visit (INDEPENDENT_AMBULATORY_CARE_PROVIDER_SITE_OTHER): Payer: Medicare HMO

## 2022-05-01 DIAGNOSIS — Z23 Encounter for immunization: Secondary | ICD-10-CM

## 2022-05-07 ENCOUNTER — Encounter: Payer: Medicare HMO | Admitting: *Deleted

## 2022-05-07 ENCOUNTER — Encounter (INDEPENDENT_AMBULATORY_CARE_PROVIDER_SITE_OTHER): Payer: Medicare HMO | Admitting: Internal Medicine

## 2022-05-07 DIAGNOSIS — J441 Chronic obstructive pulmonary disease with (acute) exacerbation: Secondary | ICD-10-CM

## 2022-05-07 DIAGNOSIS — Z006 Encounter for examination for normal comparison and control in clinical research program: Secondary | ICD-10-CM

## 2022-05-07 DIAGNOSIS — J449 Chronic obstructive pulmonary disease, unspecified: Secondary | ICD-10-CM

## 2022-05-07 MED ORDER — STUDY - ARNASA GB44332 - ASTEGOLIMAB 238 MG/1.7 ML OR PLACEBO SQ INJECTION (PI-RAMASWAMY)
476.0000 mg | INJECTION | SUBCUTANEOUS | Status: DC
  Administered 2022-05-07: 476 mg via SUBCUTANEOUS
  Filled 2022-05-07: qty 3.4

## 2022-05-07 NOTE — Patient Instructions (Signed)
ICD-10-CM   1. Patient in clinical research study  Z00.6     2. Stage 4 very severe COPD by GOLD classification ([place])  J44.9     3. COPD, frequent exacerbations ([place])  J44.1      Randomization visit per protocol

## 2022-05-07 NOTE — Research (Signed)
Title: A Phase III, randomized, double-blind, placebo controlled, multicenter study to evaluate the efficacy and safety of astegolimab in patients with chronic obstructive pulmonary disease    Dose and Duration of Treatment: Astegolimab is presented as a sterile, slightly brown-yellow solution. Each single-use, 2.25 mL pre-filled syringe contains 1.7 mL deliverable volume. Astegolimab drug product is formulated at 140 mg/mL astegolimab with 114 mM succinic acid, 200 mM L-arginine, 10 mM L-methionine, 0.06% (w/v) polysorbate 20, pH 5.7. Placebo for astegolimab is supplied in an identical pre-filled syringe configuration.   Protocol # [id number]   Sponsor: F. [place], [place]   Protocol Version/Amendment:Protocol: Version 2, dated [id number] IB: version 7 dated April 2022 ICF: Main version 12Apr2023, revised 17July2023   Investigator Brochure Product: Astegolimab [phone])   Mechanism of action: Astegolimab (also known as [id number] or [id number]) is a fully human, IgG2 monoclonal antibody that binds with high affinity to the interleukin (IL)-33 (IL-33) receptor, ST2, thereby blocking the signaling of IL-33, an inflammatory cytokine of the IL-1 family and member of the "alarmin" class of molecules. Astegolimab binds with high affinity to the human and cynomolgus monkey receptor for IL-33, ST2, and blocks IL-33 binding, thus inhibiting association with the IL-1R accessory protein (AcP) co-receptor and formation of an activated receptor complex.   Key Inclusion Criteria: Age 72-80 years at Visit 1   Documented COPD diagnosis for ?12 months prior to Visit 1 History of frequent exacerbations, defined as having had 2 or more moderate or severe COPD exacerbations within 12 months prior to Visit 1 Post-bronchodilator FEV1 ?20% and <80% of predicted at Visit 1 or Visit 2  Post-bronchodilator FEV1/FVC <0.70 at Visit 1 or Visit 2  mMRC score ?2 at screening    Current tobacco smoker or former smoker (having stopped smoking for at least 6 months prior to Visit 1) with a history of smoking ?10 pack-years On optimized COPD maintenance therapy as defined below for ?12 months prior to Visit 1            -Inhaled corticosteroid (ICS) plus long-acting beta-agonist (LABA)           -Long-acting muscarinic antagonist (LAMA) plus LABA           -ICS plus LAMA plus LABA  Key Exclusion Criteria: Current documented diagnosis of asthma  Diagnosis of a-1 antitrypsin deficiency  History of long-term treatment with oxygen at >4.0 liters/minute  Any infection that resulted in hospital admission for ?24 hours and/or treatment with oral, IV, or IM antibiotics within 4 weeks prior to Visit 1 or during screening Upper or lower respiratory tract infection within 4 weeks prior to Visit 1 or during screening Treatment with oral, IV, or IM corticosteroids (>10 mg/day prednisolone or equivalent) within 4 weeks prior to initiation of study drug Treatment with a licensed biologic agent (e.g., omalizumab, dupilumab, and/or anti-IL-5 therapies) within 3 months or 5 drug-elimination half-lives (whichever is longer) prior to screening  Planned surgical intervention during the study Known immunodeficiency, including but not limited to, HIV infection  AST, ALT, or total bilirubin elevation ?2.0 x the upper limit of normal (ULN) during screening  History of malignancy within 5 years prior to screening, with the exception of malignancies with a negligible risk of metastasis or death (e.g., 5-year overall survival rate >90%), such as adequately treated carcinoma in situ of the cervix, non-melanoma carcinoma, localized prostate cancer, or ductal carcinoma in situ Unstable cardiac disease, myocardial infarction, or New York Heart Association Class III  or IV heart failure within 12 months prior to screening History or absence of an abnormal ECG that is deemed clinically significant by the  investigator, including complete left bundle branch block or second- or third-degree atrioventricular heart block     Integrated Pharmacokinetic/Pharmacodynamic Analysis:  In Studies EG31517, OH60737, and TG62694, exploratory biomarker analysis showed there were consistent decreases in blood eosinophil counts throughout the treatment period, potentially mediated by a direct effect of IL-33 on eosinophil progenitors. In [place], there was no significant difference in fractional exhaled nitric oxide (FeNO) levels (reflecting airway IL-4/IL-13 activity) between astegolimab-treated groups relative to placebo throughout the treatment period. These data suggest that astegolimab has only a limited effect on Type 2 inflammation in asthma. No data are applicable for Study [id number]. No data are available yet for Study [id number].  Special Warnings/Considerations: Administration of astegolimab, a protein therapeutic, may lead to the development of anti-astegolimab antibodies which could lead to AEs and/or decreased exposure. In non-clinical studies (see Section 4.2), ADA incidence has generally been low and there was no apparent ADA impact on PK and safety in these studies. To date, the immunogenicity rates observed with astegolimab in clinical studies have been relatively low as well (see Immunogenicity Section 5.6). Several clinical studies have been conducted in patients with asthma, atopic dermatitis, COPD (IIS Study [id number]) and COVID-19 severe pneumonia, which generally showed low incidence of ADAs (Table 31). There has been no correlation between ADA status and clinical findings or increased incidence of AEs. Astegolimab is now being considered in a larger study for the treatment of COPD. This patient population is typically considered to have a hyper-responsive immune system. Route of administration for this molecule will be [place], either Q2W or Q4W. These factors increase the risk of development of an immune  response to astegolimab, specifically with repeat dosing. From the previous IIS Study [id number], incidence of ADAs was low in COPD patients; this remains to be confirmed in a larger study. To monitor ADA development in ongoing studies, serum samples will be collected from patients at protocol-defined intervals. Patients who test positive for antibodies and have clinical sequelae that are considered potentially related to an ADA response may also be asked to return for additional follow-up testing.  Drug Interaction Studies: No PK drug interaction studies have been conducted to date.  Serious Adverse Reactions Observed in Asthma Study: During the treatment period, a similar proportion of patients across all cohorts experienced at least one AE, regardless of causality (Table 22). In total, 77.2%, 70.9%, 72.2%, and 72.1% of patients reported at least 1 AE in the placebo, 70 mg, 210 mg, and 490 mg groups, respectively. The most common AE's (>5% in any treatment group) were asthma, nasopharyngitis, upper respiratory tract infection, headache, and injection site reaction (ISR). The most common drug-related AE was ISR, which was reported more frequently in the astegolimab treatment groups than in the placebo group (1 [0.8%] patient in the placebo group, 10 [7.9%] patients with 70 mg, 8 [6.3%] patients with 210 mg, 6 [4.9%] patients with 490 mg). All ISRs were non-serious and mild or moderate in severity. During the treatment period, 50 SAEs were reported in 37 (7.4%) patients. The number of patients reporting SAEs was comparable across all cohorts (11 SAEs in 8 patients on placebo, 21 SAEs in 14 patients on 70 mg, 11 SAEs in 9 patients on 210 mg, and 7 SAEs in 6 patients on 490 mg). The most common SAE was asthma. One SAE of moderate livedo reticularis (70  mg) was considered a suspected unexpected serious adverse drug reaction (SUSAR) related to astegolimab and was reported two days after the second dose, leading to  discontinuation of astegolimab. Two (0.4%) patients reported anaphylaxis and hypersensitivity reactions: 1 severe SAE of anaphylactic reaction (placebo), and 1 moderate hypersensitivity (490 mg) considered related to astegolimab. Three (0.6%) patients experienced a potential Major Adverse Cardiac Event (MACE) (1 patient each in the placebo, 70 mg, and 210 mg groups). None of the potential MACE were considered related to astegolimab. Overall, 233 (46.4%) patients reported events of infection. A comparable number of patients reported infection across all treatment groups (65 [51.2%] patients on placebo, 55 [43.3%] patients on 70 mg, 58 [46.0%] patients on 210 mg, and 55 [45.1%] patients on 490 mg). The most frequently reported infection (?10% incidence) was nasopharyngitis (12.7%). One patient on astegolimab 210 mg and the partner of one patient on placebo became pregnant during the study. Both delivered normal/healthy babies. Two deaths, unrelated to study drug, were reported: one patient on 210 mg astegolimab died following an SAE of asthma; the other patient on 490 mg astegolimab had an unexplained death. There were no clinically meaningful changes in laboratory parameters, vital signs, or ECG results, other than the 10% decrease in mean blood eosinophil counts in the astegolimab-treated groups, with no safety concerns. Treatment-induced ADAs were comparable between the astegolimab groups and had no impact on safety. Overall, astegolimab was well tolerated at all doses used and had a safety profile consistent with that observed in the previous astegolimab Phase I studies.  Serious Adverse Reactions Observed in Previous COPD Study: In the completed IIS Study [id number], a total of 81 patients received at least one dose of astegolimab or placebo. The safety profile of astegolimab was similar to that of placebo. There were a total of 222 AEs reported in 62 patients. A total of 28 (72%) patients in the placebo arm and  34 (81%) patients in the astegolimab arm reported at least one AE. The most commonly reported AEs were headache followed by urinary tract infection and viral upper respiratory tract infection. A total of 39 SAEs were reported in 28 (35%) patients; 16 (41%) patients in the placebo group and 12 (29%) in the astegolimab group. The most commonly reported SAE was hospital admission for community acquired pneumonia. Four SAEs resulted in patient discontinuation from treatment (1 patient in the placebo group and 3 patients in the astegolimab group). One patient in the placebo group experienced an AESI of potential MACE (heart failure, unrelated to the study treatment). No anaphylaxis or pregnancies were reported. Two deaths, unrelated to study treatment, were reported: 1 patient in the placebo group died after hospital acquired pneumonia, and another patient in the placebo group died after pneumonia and type 2 respiratory failure.  Safety Data: Astegolimab has been generally well tolerated. There have been 25 patient deaths across all astegolimab studies (Section 5.5.2), none of which were considered related to astegolimab. A total of 144 subjects experienced a total of 224 SAEs across all astegolimab studies. Of these, an SAE livedo reticularis observed in 1 patient was considered related to astegolimab by the investigator (Section 5.5.3). AEs leading to withdrawal (Section 5.5.4) have generally occurred at a rate similar to what would be expected for clinical trials in the studies' respective indications.   [facility] [place] Coordinator note :   This visit for Subject Katie Greene with DOB: [birth year] on 05/07/2022 for the above protocol is Visit/Encounter # 2/Day 1  and is for purpose of research.  Subject expressed continued interest and consent in continuing as a study subject. Subject confirmed that there was no change in contact information (e.g. address, telephone, email). Subject  thanked for participation in research and contribution to science.  In this visit 05/07/2022 the subject will be evaluated by  Principal Investigator named Brand Males, MD  . This research coordinator has verified that the above investigator is up to date with his/her training logs.  This visit is a key visit of  randomization. The PI is available for this visit.    All procedures completed per the above stated protocol. Subject tolerated first IP administration well with no complaints. Refer to the subjects paper source binder for further details of the visit.   Signed by [place] Bing, BS, [place], [place] Coordinator  [facility]  [place], [place] 3:10 PM 05/07/2022

## 2022-05-07 NOTE — Progress Notes (Signed)
Title: A Phase III, randomized, double-blind, placebo controlled, multicenter study to evaluate the efficacy and safety of astegolimab in patients with chronic obstructive pulmonary disease    Dose and Duration of Treatment: Astegolimab is presented as a sterile, slightly brown-yellow solution. Each single-use, 2.25 mL pre-filled syringe contains 1.7 mL deliverable volume. Astegolimab drug product is formulated at 140 mg/mL astegolimab with 114 mM succinic acid, 200 mM L-arginine, 10 mM L-methionine, 0.06% (w/v) polysorbate 20, pH 5.7. Placebo for astegolimab is supplied in an identical pre-filled syringe configuration.   Protocol # [id number]   Sponsor: F. [place], [place]  Mechanism of action: Astegolimab (also known as [id number] or [phone]) is a fully human, IgG2 monoclonal antibody that binds with high affinity to the interleukin (IL)-33 (IL-33) receptor, ST2, thereby blocking the signaling of IL-33, an inflammatory cytokine of the IL-1 family and member of the "alarmin" class of molecules. Astegolimab binds with high affinity to the human and cynomolgus monkey receptor for IL-33, ST2, and blocks IL-33 binding, thus inhibiting association with the IL-1R accessory protein (AcP) co-receptor and formation of an activated receptor complex.   Serious Adverse Reactions Observed in Asthma Study: During the treatment period, a similar proportion of patients across all cohorts experienced at least one AE, regardless of causality (Table 22). In total, 77.2%, 70.9%, 72.2%, and 72.1% of patients reported at least 1 AE in the placebo, 70 mg, 210 mg, and 490 mg groups, respectively. The most common AE's (>5% in any treatment group) were asthma, nasopharyngitis, upper respiratory tract infection, headache, and injection site reaction (ISR). The most common drug-related AE was ISR, which was reported more frequently in the astegolimab treatment groups than in the  placebo group (1 [0.8%] patient in the placebo group, 10 [7.9%] patients with 70 mg, 8 [6.3%] patients with 210 mg, 6 [4.9%] patients with 490 mg). All ISRs were non-serious and mild or moderate in severity. During the treatment period, 50 SAEs were reported in 37 (7.4%) patients. The number of patients reporting SAEs was comparable across all cohorts (11 SAEs in 8 patients on placebo, 21 SAEs in 14 patients on 70 mg, 11 SAEs in 9 patients on 210 mg, and 7 SAEs in 6 patients on 490 mg). The most common SAE was asthma. One SAE of moderate livedo reticularis (70 mg) was considered a suspected unexpected serious adverse drug reaction (SUSAR) related to astegolimab and was reported two days after the second dose, leading to discontinuation of astegolimab. Two (0.4%) patients reported anaphylaxis and hypersensitivity reactions: 1 severe SAE of anaphylactic reaction (placebo), and 1 moderate hypersensitivity (490 mg) considered related to astegolimab. Three (0.6%) patients experienced a potential Major Adverse Cardiac Event (MACE) (1 patient each in the placebo, 70 mg, and 210 mg groups). None of the potential MACE were considered related to astegolimab. Overall, 233 (46.4%) patients reported events of infection. A comparable number of patients reported infection across all treatment groups (65 [51.2%] patients on placebo, 55 [43.3%] patients on 70 mg, 58 [46.0%] patients on 210 mg, and 55 [45.1%] patients on 490 mg). The most frequently reported infection (?10% incidence) was nasopharyngitis (12.7%). One patient on astegolimab 210 mg and the partner of one patient on placebo became pregnant during the study. Both delivered normal/healthy babies. Two deaths, unrelated to study drug, were reported: one patient on 210 mg astegolimab died following an SAE of asthma; the other patient on 490 mg astegolimab had an unexplained death. There were no clinically meaningful changes in  laboratory parameters, vital signs, or ECG  results, other than the 10% decrease in mean blood eosinophil counts in the astegolimab-treated groups, with no safety concerns. Treatment-induced ADAs were comparable between the astegolimab groups and had no impact on safety. Overall, astegolimab was well tolerated at all doses used and had a safety profile consistent with that observed in the previous astegolimab Phase I studies.  Serious Adverse Reactions Observed in Previous COPD Study: In the completed IIS Study [id number], a total of 81 patients received at least one dose of astegolimab or placebo. The safety profile of astegolimab was similar to that of placebo. There were a total of 222 AEs reported in 62 patients. A total of 28 (72%) patients in the placebo arm and 34 (81%) patients in the astegolimab arm reported at least one AE. The most commonly reported AEs were headache followed by urinary tract infection and viral upper respiratory tract infection. A total of 39 SAEs were reported in 28 (35%) patients; 16 (41%) patients in the placebo group and 12 (29%) in the astegolimab group. The most commonly reported SAE was hospital admission for community acquired pneumonia. Four SAEs resulted in patient discontinuation from treatment (1 patient in the placebo group and 3 patients in the astegolimab group). One patient in the placebo group experienced an AESI of potential MACE (heart failure, unrelated to the study treatment). No anaphylaxis or pregnancies were reported. Two deaths, unrelated to study treatment, were reported: 1 patient in the placebo group died after hospital acquired pneumonia, and another patient in the placebo group died after pneumonia and type 2 respiratory failure.  Safety Data: Astegolimab has been generally well tolerated. There have been 27 patient deaths across all astegolimab studies (Section 5.5.2), none of which were considered related to astegolimab. A total of 144 subjects experienced a total of 224 SAEs across all astegolimab  studies. Of these, an SAE livedo reticularis observed in 1 patient was considered related to astegolimab by the investigator (Section 5.5.3). AEs leading to withdrawal (Section 5.5.4) have generally occurred at a rate similar to what would be expected for clinical trials in the studies' respective indications.     Xxxxxx This visit for Subject Katie Greene with DOB: 15-Jun-1956 on 05/07/2022 for the above protocol is Visit/Encounter # randomizatio  and is for purpose of research . Subject/LAR expressed continued interest and consent in continuing as a study subject. Subject thanked for participation in research and contribution to science.    S: Returns for follow-up.  This visit is for randomization visit.  She endorses no new complaints.  Inclusion excision criteria reviewed   O: Physical exam done and documented in the paper chart.  No images  A Recent subject COPD currently stable but history of frequent exacerbations  Plan  - Proceed with randomization visit    SIGNATURE    Dr. Brand Males, M.D., F.C.C.P, ACRP-CPI Pulmonary and Critical Care Medicine Research Investigator, [facility] Staff Physician, [place] Director - Interstitial Lung Disease  Program  [facility] [place], [place], [address]   Pager: [phone], If no answer  OR between  19:00-7:00h: page [phone] Telephone (research): [phone]  9:19 AM 05/07/2022   9:19 AM 05/07/2022

## 2022-05-19 ENCOUNTER — Encounter: Payer: Medicare HMO | Admitting: Internal Medicine

## 2022-05-19 DIAGNOSIS — I872 Venous insufficiency (chronic) (peripheral): Secondary | ICD-10-CM | POA: Diagnosis not present

## 2022-05-19 DIAGNOSIS — I2781 Cor pulmonale (chronic): Secondary | ICD-10-CM | POA: Diagnosis not present

## 2022-05-19 DIAGNOSIS — I10 Essential (primary) hypertension: Secondary | ICD-10-CM | POA: Diagnosis not present

## 2022-05-19 DIAGNOSIS — J439 Emphysema, unspecified: Secondary | ICD-10-CM | POA: Diagnosis not present

## 2022-05-19 DIAGNOSIS — J449 Chronic obstructive pulmonary disease, unspecified: Secondary | ICD-10-CM | POA: Diagnosis not present

## 2022-05-19 DIAGNOSIS — R0600 Dyspnea, unspecified: Secondary | ICD-10-CM | POA: Diagnosis not present

## 2022-05-19 DIAGNOSIS — R600 Localized edema: Secondary | ICD-10-CM | POA: Diagnosis not present

## 2022-05-21 ENCOUNTER — Encounter: Payer: Medicare HMO | Admitting: *Deleted

## 2022-05-21 DIAGNOSIS — J441 Chronic obstructive pulmonary disease with (acute) exacerbation: Secondary | ICD-10-CM

## 2022-05-21 DIAGNOSIS — Z006 Encounter for examination for normal comparison and control in clinical research program: Secondary | ICD-10-CM

## 2022-05-21 MED ORDER — STUDY - ARNASA GB44332 - ASTEGOLIMAB 238 MG/1.7 ML OR PLACEBO SQ INJECTION (PI-RAMASWAMY)
476.0000 mg | INJECTION | SUBCUTANEOUS | Status: DC
  Administered 2022-05-21: 476 mg via SUBCUTANEOUS
  Filled 2022-05-21: qty 3.4

## 2022-05-21 NOTE — Research (Signed)
Title: A Phase III, randomized, double-blind, placebo controlled, multicenter study to evaluate the efficacy and safety of astegolimab in patients with chronic obstructive pulmonary disease    Dose and Duration of Treatment: Astegolimab is presented as a sterile, slightly brown-yellow solution. Each single-use, 2.25 mL pre-filled syringe contains 1.7 mL deliverable volume. Astegolimab drug product is formulated at 140 mg/mL astegolimab with 114 mM succinic acid, 200 mM L-arginine, 10 mM L-methionine, 0.06% (w/v) polysorbate 20, pH 5.7. Placebo for astegolimab is supplied in an identical pre-filled syringe configuration.   Protocol # [id number]   Sponsor: F. [employer], [place]    Conservator, museum/gallery: Astegolimab [phone])   Mechanism of action: Astegolimab (also known as [id number] or [id number]) is a fully human, IgG2 monoclonal antibody that binds with high affinity to the interleukin (IL)-33 (IL-33) receptor, ST2, thereby blocking the signaling of IL-33, an inflammatory cytokine of the IL-1 family and member of the "alarmin" class of molecules. Astegolimab binds with high affinity to the human and cynomolgus monkey receptor for IL-33, ST2, and blocks IL-33 binding, thus inhibiting association with the IL-1R accessory protein (AcP) co-receptor and formation of an activated receptor complex.    [facility] [place] Coordinator note :   This visit for Subject Katie Greene with DOB: 02-Sep-1955 on 05/21/2022 for the above protocol is Visit/Encounter # 3 Week 2  and is for purpose of research.   The consent for this encounter is under Protocol Version Protocol: Version 2, dated [id number] IB: version 7 dated April 2022 ICF: Main version 12Apr2023, revised 17July2023 [facility] [phone], revised 01Sep2023 Lab Manual: V2.1.0 [id number] is currently IRB approved.    Subject/LAR) expressed continued interest and consent  in continuing as a study subject. Subject confirmed that there was  no change in contact information (e.g. address, telephone, email). Subject thanked for participation in research and contribution to science.   The Subject was informed that the PI Brand Males, MD continues to have oversight of the subject's visits and course  through relevant discussions, reviews and also specifically of this visit by routing of this note to the PI.  Week 2 visit completed per the above mentioned protocol. This visit consisted of AE review, con med review, Vital signs, and IP administration.   IP was administered, subject was observed for 30 mins following administration. Subject had no complaints following administration. Refer to the subjects paper source binder for further details of the visit.    Signed by [place] Bing, [place], [place] Coordinator  [facility]  [place], [place] 2:19 PM 05/21/2022

## 2022-06-04 ENCOUNTER — Encounter: Payer: Medicare HMO | Admitting: *Deleted

## 2022-06-04 DIAGNOSIS — Z006 Encounter for examination for normal comparison and control in clinical research program: Secondary | ICD-10-CM

## 2022-06-04 DIAGNOSIS — J441 Chronic obstructive pulmonary disease with (acute) exacerbation: Secondary | ICD-10-CM

## 2022-06-04 MED ORDER — STUDY - ARNASA GB44332 - ASTEGOLIMAB 238 MG/1.7 ML OR PLACEBO SQ INJECTION (PI-RAMASWAMY)
476.0000 mg | INJECTION | SUBCUTANEOUS | Status: DC
  Administered 2022-06-04: 476 mg via SUBCUTANEOUS
  Filled 2022-06-04: qty 3.4

## 2022-06-08 NOTE — Research (Signed)
Title: A Phase III, randomized, double-blind, placebo controlled, multicenter study to evaluate the efficacy and safety of astegolimab in patients with chronic obstructive pulmonary disease    Dose and Duration of Treatment: Astegolimab is presented as a sterile, slightly brown-yellow solution. Each single-use, 2.25 mL pre-filled syringe contains 1.7 mL deliverable volume. Astegolimab drug product is formulated at 140 mg/mL astegolimab with 114 mM succinic acid, 200 mM L-arginine, 10 mM L-methionine, 0.06% (w/v) polysorbate 20, pH 5.7. Placebo for astegolimab is supplied in an identical pre-filled syringe configuration.   Protocol # [id number]   Sponsor: [employer], [place]    LATE ENTRY on [id number]: [facility] Coordinator note :   This visit for Subject Katie Greene with DOB: 16-Jun-1956 on 06/04/2022 for the above protocol is Visit/Encounter # Week 4  and is for purpose of research.   The consent for this encounter is under Protocol Version Protocol: Version 2, dated [id number] IB: version 7 dated April 2022 ICF: Main version 12Apr2023, revised 17July2023 [facility] [phone], revised 01Sep2023 Lab Manual: V2.1.0 [id number] is currently IRB approved.   Subject expressed continued interest and consent in continuing as a study subject. Subject confirmed that there was  no change in contact information (e.g. address, telephone, email). Subject thanked for participation in research and contribution to science.   The Subject was informed that the PI Brand Males, MD continues to have oversight of the subject's visits and course  through relevant discussions, reviews and also specifically of this visit by routing of this note to the PI. Week 4 visit completed per the above mentioned protocol. Subject tolerated injection well without complaints. Refer to the subjects paper source binder for further details of the  visit.  Hollace Kinnier participated in this visit as well as part of training.   Signed by [place] Bing, CMA, BS, [place] Coordinator [place], [place] 5:17 PM 06/08/2022

## 2022-06-17 ENCOUNTER — Ambulatory Visit (HOSPITAL_COMMUNITY)
Admission: RE | Admit: 2022-06-17 | Discharge: 2022-06-17 | Disposition: A | Discharge status: Home / Self Care | Payer: Medicare HMO | Source: Ambulatory Visit | Attending: Internal Medicine | Admitting: Internal Medicine

## 2022-06-17 DIAGNOSIS — Z87891 Personal history of nicotine dependence: Secondary | ICD-10-CM | POA: Insufficient documentation

## 2022-06-18 ENCOUNTER — Encounter: Payer: Medicare HMO | Admitting: Internal Medicine

## 2022-06-18 DIAGNOSIS — J441 Chronic obstructive pulmonary disease with (acute) exacerbation: Secondary | ICD-10-CM

## 2022-06-18 DIAGNOSIS — Z006 Encounter for examination for normal comparison and control in clinical research program: Secondary | ICD-10-CM

## 2022-06-18 MED ORDER — STUDY - ARNASA GB44332 - ASTEGOLIMAB 238 MG/1.7 ML OR PLACEBO SQ INJECTION (PI-RAMASWAMY)
476.0000 mg | INJECTION | SUBCUTANEOUS | Status: DC
  Administered 2022-06-18: 476 mg via SUBCUTANEOUS
  Filled 2022-06-18: qty 3.4

## 2022-06-18 NOTE — Research (Signed)
Title: A Phase III, randomized, double-blind, placebo controlled, multicenter study to evaluate the efficacy and safety of astegolimab in patients with chronic obstructive pulmonary disease    Dose and Duration of Treatment: Astegolimab is presented as a sterile, slightly brown-yellow solution. Each single-use, 2.25 mL pre-filled syringe contains 1.7 mL deliverable volume. Astegolimab drug product is formulated at 140 mg/mL astegolimab with 114 mM succinic acid, 200 mM L-arginine, 10 mM L-methionine, 0.06% (w/v) polysorbate 20, pH 5.7. Placebo for astegolimab is supplied in an identical pre-filled syringe configuration.   Protocol # [id number]   Sponsor: [employer] [place], [place]   Protocol: Version 3, dated 26/May2023, Version 4 dated 20Jun2023 IB: version 8 dated April 2023 ICF: Main version 12Apr2023, revised 17July2023 [facility] [phone], revised 01Sep2023 Investigator Brochure Product: Astegolimab [id number])   Mechanism of action: Astegolimab (also known as [id number] or [id number]) is a fully human, IgG2 monoclonal antibody that binds with high affinity to the interleukin (IL)-33 (IL-33) receptor, ST2, thereby blocking the signaling of IL-33, an inflammatory cytokine of the IL-1 family and member of the "alarmin" class of molecules. Astegolimab binds with high affinity to the human and cynomolgus monkey receptor for IL-33, ST2, and blocks IL-33 binding, thus inhibiting association with the IL-1R accessory protein (AcP) co-receptor and formation of an activated receptor complex.   Key Inclusion Criteria: Age 14-80 years at Visit 1   Documented COPD diagnosis for ?12 months prior to Visit 1 History of frequent exacerbations, defined as having had 2 or more moderate or severe COPD exacerbations within 12 months prior to Visit 1 Post-bronchodilator FEV1 ?20% and <80% of predicted at Visit 1 or Visit 2  Post-bronchodilator FEV1/FVC <0.70 at Visit  1 or Visit 2  mMRC score ?2 at screening   Current tobacco smoker or former smoker (having stopped smoking for at least 6 months prior to Visit 1) with a history of smoking ?10 pack-years On optimized COPD maintenance therapy as defined below for ?12 months prior to Visit 1            -Inhaled corticosteroid (ICS) plus long-acting beta-agonist (LABA)           -Long-acting muscarinic antagonist (LAMA) plus LABA           -ICS plus LAMA plus LABA  Key Exclusion Criteria: Current documented diagnosis of asthma  Diagnosis of a-1 antitrypsin deficiency  History of long-term treatment with oxygen at >4.0 liters/minute  Any infection that resulted in hospital admission for ?24 hours and/or treatment with oral, IV, or IM antibiotics within 4 weeks prior to Visit 1 or during screening Upper or lower respiratory tract infection within 4 weeks prior to Visit 1 or during screening Treatment with oral, IV, or IM corticosteroids (>10 mg/day prednisolone or equivalent) within 4 weeks prior to initiation of study drug Treatment with a licensed biologic agent (e.g., omalizumab, dupilumab, and/or anti-IL-5 therapies) within 3 months or 5 drug-elimination half-lives (whichever is longer) prior to screening  Planned surgical intervention during the study Known immunodeficiency, including but not limited to, HIV infection  AST, ALT, or total bilirubin elevation ?2.0 x the upper limit of normal (ULN) during screening  History of malignancy within 5 years prior to screening, with the exception of malignancies with a negligible risk of metastasis or death (e.g., 5-year overall survival rate >90%), such as adequately treated carcinoma in situ of the cervix, non-melanoma carcinoma, localized prostate cancer, or ductal carcinoma in situ Unstable cardiac disease, myocardial infarction, or  New York Heart Association Class III or IV heart failure within 12 months prior to screening History or absence of an abnormal ECG that  is deemed clinically significant by the investigator, including complete left bundle branch block or second- or third-degree atrioventricular heart block     Integrated Pharmacokinetic/Pharmacodynamic Analysis:  In Studies [id number], [id number], and [id number], exploratory biomarker analysis showed there were consistent decreases in blood eosinophil counts throughout the treatment period, potentially mediated by a direct effect of IL-33 on eosinophil progenitors. In [place], there was no significant difference in fractional exhaled nitric oxide (FeNO) levels (reflecting airway IL-4/IL-13 activity) between astegolimab-treated groups relative to placebo throughout the treatment period. These data suggest that astegolimab has only a limited effect on Type 2 inflammation in asthma. No data are applicable for Study [id number]. No data are available yet for Study [id number].  Special Warnings/Considerations: Administration of astegolimab, a protein therapeutic, may lead to the development of anti-astegolimab antibodies which could lead to AEs and/or decreased exposure. In non-clinical studies (see Section 4.2), ADA incidence has generally been low and there was no apparent ADA impact on PK and safety in these studies. To date, the immunogenicity rates observed with astegolimab in clinical studies have been relatively low as well (see Immunogenicity Section 5.6). Several clinical studies have been conducted in patients with asthma, atopic dermatitis, COPD (IIS Study [id number]) and COVID-19 severe pneumonia, which generally showed low incidence of ADAs (Table 31). There has been no correlation between ADA status and clinical findings or increased incidence of AEs. Astegolimab is now being considered in a larger study for the treatment of COPD. This patient population is typically considered to have a hyper-responsive immune system. Route of administration for this molecule will be [place], either Q2W or Q4W. These factors increase the  risk of development of an immune response to astegolimab, specifically with repeat dosing. From the previous IIS Study [id number], incidence of ADAs was low in COPD patients; this remains to be confirmed in a larger study. To monitor ADA development in ongoing studies, serum samples will be collected from patients at protocol-defined intervals. Patients who test positive for antibodies and have clinical sequelae that are considered potentially related to an ADA response may also be asked to return for additional follow-up testing.  Drug Interaction Studies: No PK drug interaction studies have been conducted to date.  Serious Adverse Reactions Observed in Asthma Study: During the treatment period, a similar proportion of patients across all cohorts experienced at least one AE, regardless of causality (Table 22). In total, 77.2%, 70.9%, 72.2%, and 72.1% of patients reported at least 1 AE in the placebo, 70 mg, 210 mg, and 490 mg groups, respectively. The most common AE's (>5% in any treatment group) were asthma, nasopharyngitis, upper respiratory tract infection, headache, and injection site reaction (ISR). The most common drug-related AE was ISR, which was reported more frequently in the astegolimab treatment groups than in the placebo group (1 [0.8%] patient in the placebo group, 10 [7.9%] patients with 70 mg, 8 [6.3%] patients with 210 mg, 6 [4.9%] patients with 490 mg). All ISRs were non-serious and mild or moderate in severity. During the treatment period, 50 SAEs were reported in 37 (7.4%) patients. The number of patients reporting SAEs was comparable across all cohorts (11 SAEs in 8 patients on placebo, 21 SAEs in 14 patients on 70 mg, 11 SAEs in 9 patients on 210 mg, and 7 SAEs in 6 patients on 490 mg). The most common SAE was asthma. One  SAE of moderate livedo reticularis (70 mg) was considered a suspected unexpected serious adverse drug reaction (SUSAR) related to astegolimab and was reported two days  after the second dose, leading to discontinuation of astegolimab. Two (0.4%) patients reported anaphylaxis and hypersensitivity reactions: 1 severe SAE of anaphylactic reaction (placebo), and 1 moderate hypersensitivity (490 mg) considered related to astegolimab. Three (0.6%) patients experienced a potential Major Adverse Cardiac Event (MACE) (1 patient each in the placebo, 70 mg, and 210 mg groups). None of the potential MACE were considered related to astegolimab. Overall, 233 (46.4%) patients reported events of infection. A comparable number of patients reported infection across all treatment groups (65 [51.2%] patients on placebo, 55 [43.3%] patients on 70 mg, 58 [46.0%] patients on 210 mg, and 55 [45.1%] patients on 490 mg). The most frequently reported infection (?10% incidence) was nasopharyngitis (12.7%). One patient on astegolimab 210 mg and the partner of one patient on placebo became pregnant during the study. Both delivered normal/healthy babies. Two deaths, unrelated to study drug, were reported: one patient on 210 mg astegolimab died following an SAE of asthma; the other patient on 490 mg astegolimab had an unexplained death. There were no clinically meaningful changes in laboratory parameters, vital signs, or ECG results, other than the 10% decrease in mean blood eosinophil counts in the astegolimab-treated groups, with no safety concerns. Treatment-induced ADAs were comparable between the astegolimab groups and had no impact on safety. Overall, astegolimab was well tolerated at all doses used and had a safety profile consistent with that observed in the previous astegolimab Phase I studies.  Serious Adverse Reactions Observed in Previous COPD Study: In the completed IIS Study [id number], a total of 81 patients received at least one dose of astegolimab or placebo. The safety profile of astegolimab was similar to that of placebo. There were a total of 222 AEs reported in 62 patients. A total of 28  (72%) patients in the placebo arm and 34 (81%) patients in the astegolimab arm reported at least one AE. The most commonly reported AEs were headache followed by urinary tract infection and viral upper respiratory tract infection. A total of 39 SAEs were reported in 28 (35%) patients; 16 (41%) patients in the placebo group and 12 (29%) in the astegolimab group. The most commonly reported SAE was hospital admission for community acquired pneumonia. Four SAEs resulted in patient discontinuation from treatment (1 patient in the placebo group and 3 patients in the astegolimab group). One patient in the placebo group experienced an AESI of potential MACE (heart failure, unrelated to the study treatment). No anaphylaxis or pregnancies were reported. Two deaths, unrelated to study treatment, were reported: 1 patient in the placebo group died after hospital acquired pneumonia, and another patient in the placebo group died after pneumonia and type 2 respiratory failure.  Safety Data: Astegolimab has been generally well tolerated. There have been 38 patient deaths across all astegolimab studies (Section 5.5.2), none of which were considered related to astegolimab. A total of 144 subjects experienced a total of 224 SAEs across all astegolimab studies. Of these, an SAE livedo reticularis observed in 1 patient was considered related to astegolimab by the investigator (Section 5.5.3). AEs leading to withdrawal (Section 5.5.4) have generally occurred at a rate similar to what would be expected for clinical trials in the studies' respective indications.   [facility] Coordinator note:   This visit for Subject Katie Greene with DOB: 05-07-1956 on 06/18/2022 for the above protocol is  Visit/Encounter # 5  and is for purpose of research.   Protocol: Version 3, dated 26/May2023, Version 4 dated 20Jun2023 IB: version 8 dated April 2023 ICF: Main version 12Apr2023, revised 17July2023 and IS currently  IRB approved.   Subject expressed continued interest and consent in continuing as a study subject. Subject confirmed that there was  no change in contact information (e.g. address, telephone, email). Subject thanked for participation in research and contribution to science. In this visit 06/18/2022 per the protocol, there was no physical exam at this visit but will continue to be overseen by Principal Investigator named Brand Males. This research coordinator has verified that the above investigator is up to date with his/her training logs.   The Subject was informed that the [facility] continues to have oversight of the subject's visits and course through relevant discussions, reviews, and also specifically of this visit by routing of this note to the PI.    Signed by  Hollace Kinnier  Clinical Research Coordinator / Nurse [facility]  [place], [place] 10:59 AM 06/18/2022

## 2022-06-19 ENCOUNTER — Other Ambulatory Visit: Payer: Self-pay | Admitting: Acute Care

## 2022-06-19 DIAGNOSIS — Z122 Encounter for screening for malignant neoplasm of respiratory organs: Secondary | ICD-10-CM

## 2022-06-19 DIAGNOSIS — Z87891 Personal history of nicotine dependence: Secondary | ICD-10-CM

## 2022-07-02 ENCOUNTER — Encounter: Payer: Medicare HMO | Admitting: Internal Medicine

## 2022-07-02 DIAGNOSIS — Z006 Encounter for examination for normal comparison and control in clinical research program: Secondary | ICD-10-CM

## 2022-07-02 DIAGNOSIS — J441 Chronic obstructive pulmonary disease with (acute) exacerbation: Secondary | ICD-10-CM

## 2022-07-02 MED ORDER — STUDY - ARNASA GB44332 - ASTEGOLIMAB 238 MG/1.7 ML OR PLACEBO SQ INJECTION (PI-RAMASWAMY)
476.0000 mg | INJECTION | SUBCUTANEOUS | Status: DC
  Administered 2022-07-02: 476 mg via SUBCUTANEOUS
  Filled 2022-07-02: qty 3.4

## 2022-07-02 NOTE — Research (Signed)
Title: A Phase III, randomized, double-blind, placebo controlled, multicenter study to evaluate the efficacy and safety of astegolimab in patients with chronic obstructive pulmonary disease    Dose and Duration of Treatment: Astegolimab is presented as a sterile, slightly brown-yellow solution. Each single-use, 2.25 mL pre-filled syringe contains 1.7 mL deliverable volume. Astegolimab drug product is formulated at 140 mg/mL astegolimab with 114 mM succinic acid, 200 mM L-arginine, 10 mM L-methionine, 0.06% (w/v) polysorbate 20, pH 5.7. Placebo for astegolimab is supplied in an identical pre-filled syringe configuration.   Protocol # [id number]   Sponsor: [employer] [place], [place]   Protocol: Version 3, dated 26/May2023, Version 4 dated 20Jun2023 IB: version 8 dated April 2023 ICF: Main version 12Apr2023, revised 17July2023 [facility] [phone], revised 01Sep2023 Lab Manual: V4.0.0 20Oct2023   Investigator Brochure Product: Astegolimab [phone])   Mechanism of action: Astegolimab (also known as [id number] or [id number]) is a fully human, IgG2 monoclonal antibody that binds with high affinity to the interleukin (IL)-33 (IL-33) receptor, ST2, thereby blocking the signaling of IL-33, an inflammatory cytokine of the IL-1 family and member of the "alarmin" class of molecules. Astegolimab binds with high affinity to the human and cynomolgus monkey receptor for IL-33, ST2, and blocks IL-33 binding, thus inhibiting association with the IL-1R accessory protein (AcP) co-receptor and formation of an activated receptor complex.   Key Inclusion Criteria: Age 66-80 years at Visit 1   Documented COPD diagnosis for ?12 months prior to Visit 1 History of frequent exacerbations, defined as having had 2 or more moderate or severe COPD exacerbations within 12 months prior to Visit 1 Post-bronchodilator FEV1 ?20% and <80% of predicted at Visit 1 or Visit 2   Post-bronchodilator FEV1/FVC <0.70 at Visit 1 or Visit 2  mMRC score ?2 at screening   Current tobacco smoker or former smoker (having stopped smoking for at least 6 months prior to Visit 1) with a history of smoking ?10 pack-years On optimized COPD maintenance therapy as defined below for ?12 months prior to Visit 1            -Inhaled corticosteroid (ICS) plus long-acting beta-agonist (LABA)           -Long-acting muscarinic antagonist (LAMA) plus LABA           -ICS plus LAMA plus LABA  Key Exclusion Criteria: Current documented diagnosis of asthma  Diagnosis of a-1 antitrypsin deficiency  History of long-term treatment with oxygen at >4.0 liters/minute  Any infection that resulted in hospital admission for ?24 hours and/or treatment with oral, IV, or IM antibiotics within 4 weeks prior to Visit 1 or during screening Upper or lower respiratory tract infection within 4 weeks prior to Visit 1 or during screening Treatment with oral, IV, or IM corticosteroids (>10 mg/day prednisolone or equivalent) within 4 weeks prior to initiation of study drug Treatment with a licensed biologic agent (e.g., omalizumab, dupilumab, and/or anti-IL-5 therapies) within 3 months or 5 drug-elimination half-lives (whichever is longer) prior to screening  Planned surgical intervention during the study Known immunodeficiency, including but not limited to, HIV infection  AST, ALT, or total bilirubin elevation ?2.0 x the upper limit of normal (ULN) during screening  History of malignancy within 5 years prior to screening, with the exception of malignancies with a negligible risk of metastasis or death (e.g., 5-year overall survival rate >90%), such as adequately treated carcinoma in situ of the cervix, non-melanoma carcinoma, localized prostate cancer, or ductal carcinoma in situ  Unstable cardiac disease, myocardial infarction, or New York Heart Association Class III or IV heart failure within 12 months prior to  screening History or absence of an abnormal ECG that is deemed clinically significant by the investigator, including complete left bundle branch block or second- or third-degree atrioventricular heart block     Integrated Pharmacokinetic/Pharmacodynamic Analysis:  In Studies [id number], [id number], and [id number], exploratory biomarker analysis showed there were consistent decreases in blood eosinophil counts throughout the treatment period, potentially mediated by a direct effect of IL-33 on eosinophil progenitors. In [place], there was no significant difference in fractional exhaled nitric oxide (FeNO) levels (reflecting airway IL-4/IL-13 activity) between astegolimab-treated groups relative to placebo throughout the treatment period. These data suggest that astegolimab has only a limited effect on Type 2 inflammation in asthma. No data are applicable for Study [id number]. No data are available yet for Study [id number].  Special Warnings/Considerations: Administration of astegolimab, a protein therapeutic, may lead to the development of anti-astegolimab antibodies which could lead to AEs and/or decreased exposure. In non-clinical studies (see Section 4.2), ADA incidence has generally been low and there was no apparent ADA impact on PK and safety in these studies. To date, the immunogenicity rates observed with astegolimab in clinical studies have been relatively low as well (see Immunogenicity Section 5.6). Several clinical studies have been conducted in patients with asthma, atopic dermatitis, COPD (IIS Study [id number]) and COVID-19 severe pneumonia, which generally showed low incidence of ADAs (Table 31). There has been no correlation between ADA status and clinical findings or increased incidence of AEs. Astegolimab is now being considered in a larger study for the treatment of COPD. This patient population is typically considered to have a hyper-responsive immune system. Route of administration for this molecule  will be [place], either Q2W or Q4W. These factors increase the risk of development of an immune response to astegolimab, specifically with repeat dosing. From the previous IIS Study [id number], incidence of ADAs was low in COPD patients; this remains to be confirmed in a larger study. To monitor ADA development in ongoing studies, serum samples will be collected from patients at protocol-defined intervals. Patients who test positive for antibodies and have clinical sequelae that are considered potentially related to an ADA response may also be asked to return for additional follow-up testing.  Drug Interaction Studies: No PK drug interaction studies have been conducted to date.  Serious Adverse Reactions Observed in Asthma Study: During the treatment period, a similar proportion of patients across all cohorts experienced at least one AE, regardless of causality (Table 22). In total, 77.2%, 70.9%, 72.2%, and 72.1% of patients reported at least 1 AE in the placebo, 70 mg, 210 mg, and 490 mg groups, respectively. The most common AE's (>5% in any treatment group) were asthma, nasopharyngitis, upper respiratory tract infection, headache, and injection site reaction (ISR). The most common drug-related AE was ISR, which was reported more frequently in the astegolimab treatment groups than in the placebo group (1 [0.8%] patient in the placebo group, 10 [7.9%] patients with 70 mg, 8 [6.3%] patients with 210 mg, 6 [4.9%] patients with 490 mg). All ISRs were non-serious and mild or moderate in severity. During the treatment period, 50 SAEs were reported in 37 (7.4%) patients. The number of patients reporting SAEs was comparable across all cohorts (11 SAEs in 8 patients on placebo, 21 SAEs in 14 patients on 70 mg, 11 SAEs in 9 patients on 210 mg, and 7 SAEs in 6 patients on 490 mg). The  most common SAE was asthma. One SAE of moderate livedo reticularis (70 mg) was considered a suspected unexpected serious adverse drug reaction  (SUSAR) related to astegolimab and was reported two days after the second dose, leading to discontinuation of astegolimab. Two (0.4%) patients reported anaphylaxis and hypersensitivity reactions: 1 severe SAE of anaphylactic reaction (placebo), and 1 moderate hypersensitivity (490 mg) considered related to astegolimab. Three (0.6%) patients experienced a potential Major Adverse Cardiac Event (MACE) (1 patient each in the placebo, 70 mg, and 210 mg groups). None of the potential MACE were considered related to astegolimab. Overall, 233 (46.4%) patients reported events of infection. A comparable number of patients reported infection across all treatment groups (65 [51.2%] patients on placebo, 55 [43.3%] patients on 70 mg, 58 [46.0%] patients on 210 mg, and 55 [45.1%] patients on 490 mg). The most frequently reported infection (?10% incidence) was nasopharyngitis (12.7%). One patient on astegolimab 210 mg and the partner of one patient on placebo became pregnant during the study. Both delivered normal/healthy babies. Two deaths, unrelated to study drug, were reported: one patient on 210 mg astegolimab died following an SAE of asthma; the other patient on 490 mg astegolimab had an unexplained death. There were no clinically meaningful changes in laboratory parameters, vital signs, or ECG results, other than the 10% decrease in mean blood eosinophil counts in the astegolimab-treated groups, with no safety concerns. Treatment-induced ADAs were comparable between the astegolimab groups and had no impact on safety. Overall, astegolimab was well tolerated at all doses used and had a safety profile consistent with that observed in the previous astegolimab Phase I studies.  Serious Adverse Reactions Observed in Previous COPD Study: In the completed IIS Study [id number], a total of 81 patients received at least one dose of astegolimab or placebo. The safety profile of astegolimab was similar to that of placebo. There were a  total of 222 AEs reported in 62 patients. A total of 28 (72%) patients in the placebo arm and 34 (81%) patients in the astegolimab arm reported at least one AE. The most commonly reported AEs were headache followed by urinary tract infection and viral upper respiratory tract infection. A total of 39 SAEs were reported in 28 (35%) patients; 16 (41%) patients in the placebo group and 12 (29%) in the astegolimab group. The most commonly reported SAE was hospital admission for community acquired pneumonia. Four SAEs resulted in patient discontinuation from treatment (1 patient in the placebo group and 3 patients in the astegolimab group). One patient in the placebo group experienced an AESI of potential MACE (heart failure, unrelated to the study treatment). No anaphylaxis or pregnancies were reported. Two deaths, unrelated to study treatment, were reported: 1 patient in the placebo group died after hospital acquired pneumonia, and another patient in the placebo group died after pneumonia and type 2 respiratory failure.  Safety Data: Astegolimab has been generally well tolerated. There have been 10 patient deaths across all astegolimab studies (Section 5.5.2), none of which were considered related to astegolimab. A total of 144 subjects experienced a total of 224 SAEs across all astegolimab studies. Of these, an SAE livedo reticularis observed in 1 patient was considered related to astegolimab by the investigator (Section 5.5.3). AEs leading to withdrawal (Section 5.5.4) have generally occurred at a rate similar to what would be expected for clinical trials in the studies' respective indications.   [facility] [place] Coordinator note :    This visit for Subject [id number] with DOB: [id number] on  [id number] for the above protocol is Visit/Encounter #  and is for purpose of research.   The consent for this encounter is under Protocol: Version 3, dated 26/May2023, Version 4 dated 20Jun2023 IB:  version 8 dated April 2023 ICF: Main version 12Apr2023, revised 17July2023 [facility] [id number], revised 01Sep2023 Lab Manual: V4.0.0 20Oct2023 Consent Version IRB Approved revised and is currently IRB approved.    Subject expressed continued interest and consent in continuing as a study subject. Subject confirmed that there was no change in contact information (e.g. address, telephone, email). Subject thanked for participation in research and contribution to science.  In this visit 6,  week 8 the subject will not be evaluated according to the protocol for this visit type.  The Subject was informed that the PI Dr. Chase Caller continues to have oversight of the subject's visits and course  through relevant discussions, reviews and also specifically of this visit by routing of this note to the [place].   Signed by Hollace Kinnier Clinical Research Coordinator  [facility]  [place], [place] 3:09 PM 07/02/2022

## 2022-07-16 ENCOUNTER — Encounter: Payer: Medicare HMO | Admitting: Internal Medicine

## 2022-07-16 DIAGNOSIS — J441 Chronic obstructive pulmonary disease with (acute) exacerbation: Secondary | ICD-10-CM

## 2022-07-16 DIAGNOSIS — Z006 Encounter for examination for normal comparison and control in clinical research program: Secondary | ICD-10-CM

## 2022-07-16 MED ORDER — STUDY - ARNASA GB44332 - ASTEGOLIMAB 238 MG/1.7 ML OR PLACEBO SQ INJECTION (PI-RAMASWAMY)
476.0000 mg | INJECTION | SUBCUTANEOUS | Status: DC
  Administered 2022-07-16: 476 mg via SUBCUTANEOUS
  Filled 2022-07-16: qty 3.4

## 2022-07-16 NOTE — Research (Signed)
Title: A Phase III, randomized, double-blind, placebo controlled, multicenter study to evaluate the efficacy and safety of astegolimab in patients with chronic obstructive pulmonary disease    Dose and Duration of Treatment: Astegolimab is presented as a sterile, slightly brown-yellow solution. Each single-use, 2.25 mL pre-filled syringe contains 1.7 mL deliverable volume. Astegolimab drug product is formulated at 140 mg/mL astegolimab with 114 mM succinic acid, 200 mM L-arginine, 10 mM L-methionine, 0.06% (w/v) polysorbate 20, pH 5.7. Placebo for astegolimab is supplied in an identical pre-filled syringe configuration.  Protocol: Version 3, dated 26/May2023, Version 4 dated 20Jun2023 IB: version 8 dated April 2023 ICF: Main version 12Apr2023, revised 17July2023 [facility] [phone], revised 01Sep2023 Lab Manual: V4.0.0 20Oct2023   Investigator Brochure Product: Astegolimab [id number])   Mechanism of action: Astegolimab (also known as [id number] or [id number]) is a fully human, IgG2 monoclonal antibody that binds with high affinity to the interleukin (IL)-33 (IL-33) receptor, ST2, thereby blocking the signaling of IL-33, an inflammatory cytokine of the IL-1 family and member of the "alarmin" class of molecules. Astegolimab binds with high affinity to the human and cynomolgus monkey receptor for IL-33, ST2, and blocks IL-33 binding, thus inhibiting association with the IL-1R accessory protein (AcP) co-receptor and formation of an activated receptor complex.   Key Inclusion Criteria: Age 32-80 years at Visit 1   Documented COPD diagnosis for ?12 months prior to Visit 1 History of frequent exacerbations, defined as having had 2 or more moderate or severe COPD exacerbations within 12 months prior to Visit 1 Post-bronchodilator FEV1 ?20% and <80% of predicted at Visit 1 or Visit 2  Post-bronchodilator FEV1/FVC <0.70 at Visit 1 or Visit 2  mMRC score ?2 at screening   Current tobacco smoker or  former smoker (having stopped smoking for at least 6 months prior to Visit 1) with a history of smoking ?10 pack-years On optimized COPD maintenance therapy as defined below for ?12 months prior to Visit 1            -Inhaled corticosteroid (ICS) plus long-acting beta-agonist (LABA)           -Long-acting muscarinic antagonist (LAMA) plus LABA           -ICS plus LAMA plus LABA  Key Exclusion Criteria: Current documented diagnosis of asthma  Diagnosis of a-1 antitrypsin deficiency  History of long-term treatment with oxygen at >4.0 liters/minute  Any infection that resulted in hospital admission for ?24 hours and/or treatment with oral, IV, or IM antibiotics within 4 weeks prior to Visit 1 or during screening Upper or lower respiratory tract infection within 4 weeks prior to Visit 1 or during screening Treatment with oral, IV, or IM corticosteroids (>10 mg/day prednisolone or equivalent) within 4 weeks prior to initiation of study drug Treatment with a licensed biologic agent (e.g., omalizumab, dupilumab, and/or anti-IL-5 therapies) within 3 months or 5 drug-elimination half-lives (whichever is longer) prior to screening  Planned surgical intervention during the study Known immunodeficiency, including but not limited to, HIV infection  AST, ALT, or total bilirubin elevation ?2.0 x the upper limit of normal (ULN) during screening  History of malignancy within 5 years prior to screening, with the exception of malignancies with a negligible risk of metastasis or death (e.g., 5-year overall survival rate >90%), such as adequately treated carcinoma in situ of the cervix, non-melanoma carcinoma, localized prostate cancer, or ductal carcinoma in situ Unstable cardiac disease, myocardial infarction, or New York Heart Association Class III or IV heart failure within 12 months  prior to screening History or absence of an abnormal ECG that is deemed clinically significant by the investigator, including  complete left bundle branch block or second- or third-degree atrioventricular heart block     Integrated Pharmacokinetic/Pharmacodynamic Analysis:  In Studies [id number], [id number], and [id number], exploratory biomarker analysis showed there were consistent decreases in blood eosinophil counts throughout the treatment period, potentially mediated by a direct effect of IL-33 on eosinophil progenitors. In [place], there was no significant difference in fractional exhaled nitric oxide (FeNO) levels (reflecting airway IL-4/IL-13 activity) between astegolimab-treated groups relative to placebo throughout the treatment period. These data suggest that astegolimab has only a limited effect on Type 2 inflammation in asthma. No data are applicable for Study [id number]. No data are available yet for Study [id number].  Special Warnings/Considerations: Administration of astegolimab, a protein therapeutic, may lead to the development of anti-astegolimab antibodies which could lead to AEs and/or decreased exposure. In non-clinical studies (see Section 4.2), ADA incidence has generally been low and there was no apparent ADA impact on PK and safety in these studies. To date, the immunogenicity rates observed with astegolimab in clinical studies have been relatively low as well (see Immunogenicity Section 5.6). Several clinical studies have been conducted in patients with asthma, atopic dermatitis, COPD (IIS Study [id number]) and COVID-19 severe pneumonia, which generally showed low incidence of ADAs (Table 31). There has been no correlation between ADA status and clinical findings or increased incidence of AEs. Astegolimab is now being considered in a larger study for the treatment of COPD. This patient population is typically considered to have a hyper-responsive immune system. Route of administration for this molecule will be [place], either Q2W or Q4W. These factors increase the risk of development of an immune response to astegolimab,  specifically with repeat dosing. From the previous IIS Study [id number], incidence of ADAs was low in COPD patients; this remains to be confirmed in a larger study. To monitor ADA development in ongoing studies, serum samples will be collected from patients at protocol-defined intervals. Patients who test positive for antibodies and have clinical sequelae that are considered potentially related to an ADA response may also be asked to return for additional follow-up testing.  Drug Interaction Studies: No PK drug interaction studies have been conducted to date.  Serious Adverse Reactions Observed in Asthma Study: During the treatment period, a similar proportion of patients across all cohorts experienced at least one AE, regardless of causality (Table 22). In total, 77.2%, 70.9%, 72.2%, and 72.1% of patients reported at least 1 AE in the placebo, 70 mg, 210 mg, and 490 mg groups, respectively. The most common AE's (>5% in any treatment group) were asthma, nasopharyngitis, upper respiratory tract infection, headache, and injection site reaction (ISR). The most common drug-related AE was ISR, which was reported more frequently in the astegolimab treatment groups than in the placebo group (1 [0.8%] patient in the placebo group, 10 [7.9%] patients with 70 mg, 8 [6.3%] patients with 210 mg, 6 [4.9%] patients with 490 mg). All ISRs were non-serious and mild or moderate in severity. During the treatment period, 50 SAEs were reported in 37 (7.4%) patients. The number of patients reporting SAEs was comparable across all cohorts (11 SAEs in 8 patients on placebo, 21 SAEs in 14 patients on 70 mg, 11 SAEs in 9 patients on 210 mg, and 7 SAEs in 6 patients on 490 mg). The most common SAE was asthma. One SAE of moderate livedo reticularis (70 mg) was considered a suspected unexpected serious  adverse drug reaction (SUSAR) related to astegolimab and was reported two days after the second dose, leading to discontinuation of  astegolimab. Two (0.4%) patients reported anaphylaxis and hypersensitivity reactions: 1 severe SAE of anaphylactic reaction (placebo), and 1 moderate hypersensitivity (490 mg) considered related to astegolimab. Three (0.6%) patients experienced a potential Major Adverse Cardiac Event (MACE) (1 patient each in the placebo, 70 mg, and 210 mg groups). None of the potential MACE were considered related to astegolimab. Overall, 233 (46.4%) patients reported events of infection. A comparable number of patients reported infection across all treatment groups (65 [51.2%] patients on placebo, 55 [43.3%] patients on 70 mg, 58 [46.0%] patients on 210 mg, and 55 [45.1%] patients on 490 mg). The most frequently reported infection (?10% incidence) was nasopharyngitis (12.7%). One patient on astegolimab 210 mg and the partner of one patient on placebo became pregnant during the study. Both delivered normal/healthy babies. Two deaths, unrelated to study drug, were reported: one patient on 210 mg astegolimab died following an SAE of asthma; the other patient on 490 mg astegolimab had an unexplained death. There were no clinically meaningful changes in laboratory parameters, vital signs, or ECG results, other than the 10% decrease in mean blood eosinophil counts in the astegolimab-treated groups, with no safety concerns. Treatment-induced ADAs were comparable between the astegolimab groups and had no impact on safety. Overall, astegolimab was well tolerated at all doses used and had a safety profile consistent with that observed in the previous astegolimab Phase I studies.  Serious Adverse Reactions Observed in Previous COPD Study: In the completed IIS Study [id number], a total of 81 patients received at least one dose of astegolimab or placebo. The safety profile of astegolimab was similar to that of placebo. There were a total of 222 AEs reported in 62 patients. A total of 28 (72%) patients in the placebo arm and 34 (81%) patients  in the astegolimab arm reported at least one AE. The most commonly reported AEs were headache followed by urinary tract infection and viral upper respiratory tract infection. A total of 39 SAEs were reported in 28 (35%) patients; 16 (41%) patients in the placebo group and 12 (29%) in the astegolimab group. The most commonly reported SAE was hospital admission for community acquired pneumonia. Four SAEs resulted in patient discontinuation from treatment (1 patient in the placebo group and 3 patients in the astegolimab group). One patient in the placebo group experienced an AESI of potential MACE (heart failure, unrelated to the study treatment). No anaphylaxis or pregnancies were reported. Two deaths, unrelated to study treatment, were reported: 1 patient in the placebo group died after hospital acquired pneumonia, and another patient in the placebo group died after pneumonia and type 2 respiratory failure.  Safety Data: Astegolimab has been generally well tolerated. There have been 31 patient deaths across all astegolimab studies (Section 5.5.2), none of which were considered related to astegolimab. A total of 144 subjects experienced a total of 224 SAEs across all astegolimab studies. Of these, an SAE livedo reticularis observed in 1 patient was considered related to astegolimab by the investigator (Section 5.5.3). AEs leading to withdrawal (Section 5.5.4) have generally occurred at a rate similar to what would be expected for clinical trials in the studies' respective indications.  [facility] [place] Coordinator note:   This visit for Subject Katie Greene with DOB: 1956/02/13 on 07/16/2022 for the above protocol is Visit/Encounter # 7 and is for purpose of research.   The consent for  this encounter is under Protocol: Version 3, dated 26/May2023, Version 4 dated 20Jun2023 IB: version 8 dated April 2023 ICF: Main version 12Apr2023, revised 17July2023 [facility] [phone], revised  01Sep2023 Lab Manual: V4.0.0 20Oct2023.   MANETTE DOTO (Subject/LAR) expressed continued interest and consent in continuing as a study subject. Subject confirmed that there was no change in contact information (e.g. address, telephone, email). Subject thanked for participation in research and contribution to science.  The Subject was informed that the [facility] continues to have oversight of the subject's visits and course through relevant discussions, reviews, and also specifically of this visit by routing of this note to the PI.  Signed by  [place], [place] 12:17 PM 07/16/2022

## 2022-07-31 ENCOUNTER — Encounter: Payer: Medicare HMO | Admitting: *Deleted

## 2022-07-31 DIAGNOSIS — J441 Chronic obstructive pulmonary disease with (acute) exacerbation: Secondary | ICD-10-CM

## 2022-07-31 DIAGNOSIS — Z006 Encounter for examination for normal comparison and control in clinical research program: Secondary | ICD-10-CM

## 2022-07-31 MED ORDER — STUDY - ARNASA GB44332 - ASTEGOLIMAB 238 MG/1.7 ML OR PLACEBO SQ INJECTION (PI-RAMASWAMY)
476.0000 mg | INJECTION | SUBCUTANEOUS | Status: DC
  Administered 2022-07-31: 476 mg via SUBCUTANEOUS
  Filled 2022-07-31: qty 3.4

## 2022-07-31 NOTE — Research (Signed)
Title: A Phase III, randomized, double-blind, placebo controlled, multicenter study to evaluate the efficacy and safety of astegolimab in patients with chronic obstructive pulmonary disease    Dose and Duration of Treatment: Astegolimab is presented as a sterile, slightly brown-yellow solution. Each single-use, 2.25 mL pre-filled syringe contains 1.7 mL deliverable volume. Astegolimab drug product is formulated at 140 mg/mL astegolimab with 114 mM succinic acid, 200 mM L-arginine, 10 mM L-methionine, 0.06% (w/v) polysorbate 20, pH 5.7. Placebo for astegolimab is supplied in an identical pre-filled syringe configuration.   Protocol: Version 3, dated 26/May2023, Version 4 dated 20Jun2023 IB: version 8 dated April 2023 ICF: Main version 12Apr2023, revised 17July2023 Mobile Nursing [id number], revised 01Sep2023 Lab Manual: V4.0.0 20Oct2023   Investigator Brochure Product: Astegolimab [id number])   Mechanism of action: Astegolimab (also known as [id number] or [id number]) is a fully human, IgG2 monoclonal antibody that binds with high affinity to the interleukin (IL)-33 (IL-33) receptor, ST2, thereby blocking the signaling of IL-33, an inflammatory cytokine of the IL-1 family and member of the "alarmin" class of molecules. Astegolimab binds with high affinity to the human and cynomolgus monkey receptor for IL-33, ST2, and blocks IL-33 binding, thus inhibiting association with the IL-1R accessory protein (AcP) co-receptor and formation of an activated receptor complex.   Key Inclusion Criteria: Age 74-80 years at Visit 1   Documented COPD diagnosis for ?12 months prior to Visit 1 History of frequent exacerbations, defined as having had 2 or more moderate or severe COPD exacerbations within 12 months prior to Visit 1 Post-bronchodilator FEV1 ?20% and <80% of predicted at Visit 1 or Visit 2  Post-bronchodilator FEV1/FVC <0.70 at Visit 1 or Visit 2  mMRC score ?2 at screening   Current tobacco smoker or  former smoker (having stopped smoking for at least 6 months prior to Visit 1) with a history of smoking ?10 pack-years On optimized COPD maintenance therapy as defined below for ?12 months prior to Visit 1            -Inhaled corticosteroid (ICS) plus long-acting beta-agonist (LABA)           -Long-acting muscarinic antagonist (LAMA) plus LABA           -ICS plus LAMA plus LABA  Key Exclusion Criteria: Current documented diagnosis of asthma  Diagnosis of a-1 antitrypsin deficiency  History of long-term treatment with oxygen at >4.0 liters/minute  Any infection that resulted in hospital admission for ?24 hours and/or treatment with oral, IV, or IM antibiotics within 4 weeks prior to Visit 1 or during screening Upper or lower respiratory tract infection within 4 weeks prior to Visit 1 or during screening Treatment with oral, IV, or IM corticosteroids (>10 mg/day prednisolone or equivalent) within 4 weeks prior to initiation of study drug Treatment with a licensed biologic agent (e.g., omalizumab, dupilumab, and/or anti-IL-5 therapies) within 3 months or 5 drug-elimination half-lives (whichever is longer) prior to screening  Planned surgical intervention during the study Known immunodeficiency, including but not limited to, HIV infection  AST, ALT, or total bilirubin elevation ?2.0 x the upper limit of normal (ULN) during screening  History of malignancy within 5 years prior to screening, with the exception of malignancies with a negligible risk of metastasis or death (e.g., 5-year overall survival rate >90%), such as adequately treated carcinoma in situ of the cervix, non-melanoma carcinoma, localized prostate cancer, or ductal carcinoma in situ Unstable cardiac disease, myocardial infarction, or New York Heart Association Class III or IV heart failure within 12  months prior to screening History or absence of an abnormal ECG that is deemed clinically significant by the investigator, including  complete left bundle branch block or second- or third-degree atrioventricular heart block     Integrated Pharmacokinetic/Pharmacodynamic Analysis:  In Studies [id number], CX44818, and [id number], exploratory biomarker analysis showed there were consistent decreases in blood eosinophil counts throughout the treatment period, potentially mediated by a direct effect of IL-33 on eosinophil progenitors. In [place], there was no significant difference in fractional exhaled nitric oxide (FeNO) levels (reflecting airway IL-4/IL-13 activity) between astegolimab-treated groups relative to placebo throughout the treatment period. These data suggest that astegolimab has only a limited effect on Type 2 inflammation in asthma. No data are applicable for Study [id number]. No data are available yet for Study [id number].  Special Warnings/Considerations: Administration of astegolimab, a protein therapeutic, may lead to the development of anti-astegolimab antibodies which could lead to AEs and/or decreased exposure. In non-clinical studies (see Section 4.2), ADA incidence has generally been low and there was no apparent ADA impact on PK and safety in these studies. To date, the immunogenicity rates observed with astegolimab in clinical studies have been relatively low as well (see Immunogenicity Section 5.6). Several clinical studies have been conducted in patients with asthma, atopic dermatitis, COPD (IIS Study [id number]) and COVID-19 severe pneumonia, which generally showed low incidence of ADAs (Table 31). There has been no correlation between ADA status and clinical findings or increased incidence of AEs. Astegolimab is now being considered in a larger study for the treatment of COPD. This patient population is typically considered to have a hyper-responsive immune system. Route of administration for this molecule will be [place], either Q2W or Q4W. These factors increase the risk of development of an immune response to astegolimab,  specifically with repeat dosing. From the previous IIS Study [id number], incidence of ADAs was low in COPD patients; this remains to be confirmed in a larger study. To monitor ADA development in ongoing studies, serum samples will be collected from patients at protocol-defined intervals. Patients who test positive for antibodies and have clinical sequelae that are considered potentially related to an ADA response may also be asked to return for additional follow-up testing.  Drug Interaction Studies: No PK drug interaction studies have been conducted to date.  Serious Adverse Reactions Observed in Asthma Study: During the treatment period, a similar proportion of patients across all cohorts experienced at least one AE, regardless of causality (Table 22). In total, 77.2%, 70.9%, 72.2%, and 72.1% of patients reported at least 1 AE in the placebo, 70 mg, 210 mg, and 490 mg groups, respectively. The most common AE's (>5% in any treatment group) were asthma, nasopharyngitis, upper respiratory tract infection, headache, and injection site reaction (ISR). The most common drug-related AE was ISR, which was reported more frequently in the astegolimab treatment groups than in the placebo group (1 [0.8%] patient in the placebo group, 10 [7.9%] patients with 70 mg, 8 [6.3%] patients with 210 mg, 6 [4.9%] patients with 490 mg). All ISRs were non-serious and mild or moderate in severity. During the treatment period, 50 SAEs were reported in 37 (7.4%) patients. The number of patients reporting SAEs was comparable across all cohorts (11 SAEs in 8 patients on placebo, 21 SAEs in 14 patients on 70 mg, 11 SAEs in 9 patients on 210 mg, and 7 SAEs in 6 patients on 490 mg). The most common SAE was asthma. One SAE of moderate livedo reticularis (70 mg) was considered a suspected unexpected  serious adverse drug reaction (SUSAR) related to astegolimab and was reported two days after the second dose, leading to discontinuation of  astegolimab. Two (0.4%) patients reported anaphylaxis and hypersensitivity reactions: 1 severe SAE of anaphylactic reaction (placebo), and 1 moderate hypersensitivity (490 mg) considered related to astegolimab. Three (0.6%) patients experienced a potential Major Adverse Cardiac Event (MACE) (1 patient each in the placebo, 70 mg, and 210 mg groups). None of the potential MACE were considered related to astegolimab. Overall, 233 (46.4%) patients reported events of infection. A comparable number of patients reported infection across all treatment groups (65 [51.2%] patients on placebo, 55 [43.3%] patients on 70 mg, 58 [46.0%] patients on 210 mg, and 55 [45.1%] patients on 490 mg). The most frequently reported infection (?10% incidence) was nasopharyngitis (12.7%). One patient on astegolimab 210 mg and the partner of one patient on placebo became pregnant during the study. Both delivered normal/healthy babies. Two deaths, unrelated to study drug, were reported: one patient on 210 mg astegolimab died following an SAE of asthma; the other patient on 490 mg astegolimab had an unexplained death. There were no clinically meaningful changes in laboratory parameters, vital signs, or ECG results, other than the 10% decrease in mean blood eosinophil counts in the astegolimab-treated groups, with no safety concerns. Treatment-induced ADAs were comparable between the astegolimab groups and had no impact on safety. Overall, astegolimab was well tolerated at all doses used and had a safety profile consistent with that observed in the previous astegolimab Phase I studies.  Serious Adverse Reactions Observed in Previous COPD Study: In the completed IIS Study [id number], a total of 81 patients received at least one dose of astegolimab or placebo. The safety profile of astegolimab was similar to that of placebo. There were a total of 222 AEs reported in 62 patients. A total of 28 (72%) patients in the placebo arm and 34 (81%) patients  in the astegolimab arm reported at least one AE. The most commonly reported AEs were headache followed by urinary tract infection and viral upper respiratory tract infection. A total of 39 SAEs were reported in 28 (35%) patients; 16 (41%) patients in the placebo group and 12 (29%) in the astegolimab group. The most commonly reported SAE was hospital admission for community acquired pneumonia. Four SAEs resulted in patient discontinuation from treatment (1 patient in the placebo group and 3 patients in the astegolimab group). One patient in the placebo group experienced an AESI of potential MACE (heart failure, unrelated to the study treatment). No anaphylaxis or pregnancies were reported. Two deaths, unrelated to study treatment, were reported: 1 patient in the placebo group died after hospital acquired pneumonia, and another patient in the placebo group died after pneumonia and type 2 respiratory failure.  Safety Data: Astegolimab has been generally well tolerated. There have been 57 patient deaths across all astegolimab studies (Section 5.5.2), none of which were considered related to astegolimab. A total of 144 subjects experienced a total of 224 SAEs across all astegolimab studies. Of these, an SAE livedo reticularis observed in 1 patient was considered related to astegolimab by the investigator (Section 5.5.3). AEs leading to withdrawal (Section 5.5.4) have generally occurred at a rate similar to what would be expected for clinical trials in the studies' respective indications.   [facility] Coordinator note :  The consent for this encounter is under Protocol Version 3, Investigator Brochure Version 8, Consent Version Main and IS currently IRB approved.    This visit for Subject  Katie Greene  with DOB: [birth year] on 29/Dec/2023  for the above protocol is Visit/Encounter #  8 and is for purpose of research.    The consent for this encounter is under Protocol Version 2.0 ,  Investigator Brochure Version 7, Consent Version IRB Approved  revised  and is currently IRB approved.    Subject expressed continued interest and consent in continuing as a study subject. Subject confirmed that there was no change in contact information (e.g. address, telephone, email). Subject thanked for participation in research and contribution to science.  In this visit 8 the subject will be evaluated by Dr. Unice Cobble. This research coordinator has verified that the above sub-investigator is up to date with his/her training logs.    The Subject was informed that the PI Dr. Chase Caller continues to have oversight of the subject's visits and course  through relevant discussions, reviews and also specifically of this visit by routing of this note to the [place].  Signed by Hollace Kinnier, [place] / Nurse [facility]  [place], [place] 3:31 PM 07/31/2022

## 2022-08-01 NOTE — Progress Notes (Signed)
Katie Greene, DOB Aug 20, 1955,was seen as subject in a clinical trial to assess efficacy & safety of Astegolimab in patients with COPD. Cardiopulmonary symptoms are stable  as occasional NP cough & DOE with housecleaning. She has had intermittent ill defined substernal chest discomfort approximately once monthly, typically with food ,especially fish ,over the past 4-5 years.She describes occasional dyspepsia & occasional dysphagia. She is on Omeprazole. Other or new symptoms denied. Pertinent physical findings include: decreased breath sounds; decreased pedal pulses ;1+ peripheral edema despite compression hose; & slight clubbing of nailbeds. All physical findings NCS                                                                     Hendricks Limes MD,SI

## 2022-08-13 ENCOUNTER — Encounter: Payer: Medicare HMO | Admitting: *Deleted

## 2022-08-13 DIAGNOSIS — J441 Chronic obstructive pulmonary disease with (acute) exacerbation: Secondary | ICD-10-CM

## 2022-08-13 DIAGNOSIS — Z006 Encounter for examination for normal comparison and control in clinical research program: Secondary | ICD-10-CM

## 2022-08-13 MED ORDER — STUDY - ARNASA GB44332 - ASTEGOLIMAB 238 MG/1.7 ML OR PLACEBO SQ INJECTION (PI-RAMASWAMY)
476.0000 mg | INJECTION | SUBCUTANEOUS | Status: DC
  Administered 2022-08-13: 476 mg via SUBCUTANEOUS
  Filled 2022-08-13: qty 3.4

## 2022-08-13 MED ORDER — STUDY - ARNASA GB44332 - ASTEGOLIMAB 238 MG/1.7 ML OR PLACEBO SQ INJECTION (PI-RAMASWAMY): 476.0000 mg | INJECTION | SUBCUTANEOUS | Status: DC

## 2022-08-13 NOTE — Research (Signed)
Title: A Phase III, randomized, double-blind, placebo controlled, multicenter study to evaluate the efficacy and safety of astegolimab in patients with chronic obstructive pulmonary disease    Dose and Duration of Treatment: Astegolimab is presented as a sterile, slightly brown-yellow solution. Each single-use, 2.25 mL pre-filled syringe contains 1.7 mL deliverable volume. Astegolimab drug product is formulated at 140 mg/mL astegolimab with 114 mM succinic acid, 200 mM L-arginine, 10 mM L-methionine, 0.06% (w/v) polysorbate 20, pH 5.7. Placebo for astegolimab is supplied in an identical pre-filled syringe configuration.   Protocol # [id number]   Sponsor: [employer] [place], [place]   [facility] Health Scientist, physiological note :    This visit for Subject Katie Greene with DOB: 1955/11/06 on 08/13/2022 for the above protocol is Visit/Encounter #  and is for purpose of research.    The consent for this encounter is under Protocol Version 2.0 , Investigator Brochure Version 7, Consent Version IRB Approved  revised  and is currently IRB approved.    Subject expressed continued interest and consent in continuing as a study subject. Subject confirmed that there was no change in contact information (e.g. address, telephone, email). Subject thanked for participation in research and contribution to science.    The Subject was informed that the PI Dr. Chase Caller continues to have oversight of the subject's visits and course  through relevant discussions, reviews and also specifically of this visit by routing of this note to the [place].    All procedures completed per the above stated protocol. Subject denies any complaints at this time. Refer to the subjects paper source binder for further details of the visit.    Signed by [place] Bing, CMA, BS, [place]  [facility]  [place], [place]

## 2022-08-27 ENCOUNTER — Encounter: Payer: Medicare HMO | Admitting: *Deleted

## 2022-08-27 DIAGNOSIS — J441 Chronic obstructive pulmonary disease with (acute) exacerbation: Secondary | ICD-10-CM

## 2022-08-27 DIAGNOSIS — Z006 Encounter for examination for normal comparison and control in clinical research program: Secondary | ICD-10-CM

## 2022-08-27 MED ORDER — STUDY - ARNASA GB44332 - ASTEGOLIMAB 238 MG/1.7 ML OR PLACEBO SQ INJECTION (PI-RAMASWAMY)
476.0000 mg | INJECTION | SUBCUTANEOUS | Status: DC
  Filled 2022-08-27: qty 3.4

## 2022-08-27 NOTE — Research (Signed)
Title: A Phase III, randomized, double-blind, placebo controlled, multicenter study to evaluate the efficacy and safety of astegolimab in patients with chronic obstructive pulmonary disease    Dose and Duration of Treatment: Astegolimab is presented as a sterile, slightly brown-yellow solution. Each single-use, 2.25 mL pre-filled syringe contains 1.7 mL deliverable volume. Astegolimab drug product is formulated at 140 mg/mL astegolimab with 114 mM succinic acid, 200 mM L-arginine, 10 mM L-methionine, 0.06% (w/v) polysorbate 20, pH 5.7. Placebo for astegolimab is supplied in an identical pre-filled syringe configuration.   Protocol # [id number]   Sponsor: [employer] [place], [place]   [facility] Health Scientist, physiological note:   This visit for Subject Katie Greene with DOB: [birth year] on 08/27/2022 for the above protocol is Visit/Encounter # Week 16 Visit 10  and is for purpose of research.   The consent for this encounter is under Protocol: Version 3, dated 26/May2023, Version 4 dated 20Jun2023 IB: version 8 dated April 2023 ICF: Main version 12Apr2023, revised [phone] Mobile Nursing [phone], revised 01Sep2023 is currently IRB approved.   Subject expressed continued interest and consent in continuing as a study subject. Subject confirmed that there was  no change in contact information (e.g. address, telephone, email). Subject thanked for participation in research and contribution to science. The Subject was informed that the PI Dr. Chase Caller continues to have oversight of the subject's visits and course through relevant discussions, reviews, and also specifically of this visit by routing of this note to the PI.  All procedures and assessments completed per the above mentioned protocol. Refer to the subjects paper source binder for further details of the visit.    Signed by Todd Bing, CMA, [place]  Coordinator  [place], [place] 11:28 AM 08/27/2022

## 2022-08-30 ENCOUNTER — Other Ambulatory Visit: Payer: Self-pay | Admitting: Internal Medicine

## 2022-09-10 ENCOUNTER — Encounter: Payer: Medicare HMO | Admitting: *Deleted

## 2022-09-10 DIAGNOSIS — Z006 Encounter for examination for normal comparison and control in clinical research program: Secondary | ICD-10-CM

## 2022-09-10 DIAGNOSIS — J441 Chronic obstructive pulmonary disease with (acute) exacerbation: Secondary | ICD-10-CM

## 2022-09-10 MED ORDER — STUDY - ARNASA GB44332 - ASTEGOLIMAB 238 MG/1.7 ML OR PLACEBO SQ INJECTION (PI-RAMASWAMY)
476.0000 mg | INJECTION | SUBCUTANEOUS | Status: DC
  Filled 2022-09-10: qty 3.4

## 2022-09-10 NOTE — Research (Signed)
Title: A Phase III, randomized, double-blind, placebo controlled, multicenter study to evaluate the efficacy and safety of astegolimab in patients with chronic obstructive pulmonary disease    Dose and Duration of Treatment: Astegolimab is presented as a sterile, slightly brown-yellow solution. Each single-use, 2.25 mL pre-filled syringe contains 1.7 mL deliverable volume. Astegolimab drug product is formulated at 140 mg/mL astegolimab with 114 mM succinic acid, 200 mM L-arginine, 10 mM L-methionine, 0.06% (w/v) polysorbate 20, pH 5.7. Placebo for astegolimab is supplied in an identical pre-filled syringe configuration.   Protocol # [id number]   Sponsor: [employer] [place], [place]   [facility] Health Scientist, physiological note:   This visit for Subject Katie Greene with DOB: Jun 29, 1956 on 09/10/2022 for the above protocol is Visit/Encounter # Week 18 Visit 11  and is for purpose of research.   The consent for this encounter is under Protocol Version  Protocol: Version 3, dated 26/May2023, Version 4 dated 20Jun2023 IB: version 8 dated April 2023 ICF: Main version 12Apr2023, revised 17July2023 is currently IRB approved.   Subject expressed continued interest and consent in continuing as a study subject. Subject confirmed that there was  no change in contact information (e.g. address, telephone, email). Subject thanked for participation in research and contribution to science.  The Subject was  informed that the [facility] continues to have oversight of the subject's visits and course through relevant discussions, reviews, and also specifically of this visit by routing of this note to the PI.  All procedures completed per the above mentioned protocol. Refer to the subjects paper source binder for further details of the visit.   AE: subject reported bruising at the left Q4 injection site from the injection given on 25jan2024. Refer to  the AE log for further details.     Signed by  [place] Bing, CMA, BS, [place] Coordinator  [place], [place] 1:45 PM 09/10/2022

## 2022-09-24 ENCOUNTER — Encounter: Payer: Medicare HMO | Admitting: *Deleted

## 2022-09-24 DIAGNOSIS — Z006 Encounter for examination for normal comparison and control in clinical research program: Secondary | ICD-10-CM

## 2022-09-24 DIAGNOSIS — I10 Essential (primary) hypertension: Secondary | ICD-10-CM | POA: Diagnosis not present

## 2022-09-24 DIAGNOSIS — K219 Gastro-esophageal reflux disease without esophagitis: Secondary | ICD-10-CM | POA: Diagnosis not present

## 2022-09-24 DIAGNOSIS — J441 Chronic obstructive pulmonary disease with (acute) exacerbation: Secondary | ICD-10-CM

## 2022-09-24 DIAGNOSIS — E785 Hyperlipidemia, unspecified: Secondary | ICD-10-CM | POA: Diagnosis not present

## 2022-09-24 MED ORDER — STUDY - ARNASA GB44332 - ASTEGOLIMAB 238 MG/1.7 ML OR PLACEBO SQ INJECTION (PI-RAMASWAMY)
476.0000 mg | INJECTION | SUBCUTANEOUS | Status: DC
  Administered 2022-09-24: 476 mg via SUBCUTANEOUS
  Filled 2022-09-24: qty 3.4

## 2022-09-24 NOTE — Research (Signed)
LATE ENTRY:  Title: A Phase III, randomized, double-blind, placebo controlled, multicenter study to evaluate the efficacy and safety of astegolimab in patients with chronic obstructive pulmonary disease    Dose and Duration of Treatment: Astegolimab is presented as a sterile, slightly brown-yellow solution. Each single-use, 2.25 mL pre-filled syringe contains 1.7 mL deliverable volume. Astegolimab drug product is formulated at 140 mg/mL astegolimab with 114 mM succinic acid, 200 mM L-arginine, 10 mM L-methionine, 0.06% (w/v) polysorbate 20, pH 5.7. Placebo for astegolimab is supplied in an identical pre-filled syringe configuration.   Protocol # [id number]   Sponsor: [employer] [address], [place]   [facility] [employer] [facility] Astronomer note:   This visit for Subject Katie Greene with DOB: 1956/01/10 on 09/24/2022 for the above protocol is Visit/Encounter # 12 Week 20  and is for purpose of research.   The consent for this encounter is under Protocol Version Protocol: Version 3, dated 26/May2023, Version 4 dated 20Jun2023 IB: version 8 dated April 2023 ICF: Main version 12Apr2023, revised 17July2023 is currently IRB approved.   Subject expressed continued interest and consent in continuing as a study subject. Subject confirmed that there was  no change in contact information (e.g. address, telephone, email). Subject thanked for participation in research and contribution to science. The Subject was informed that the PI Dr. Marchelle Gearing continues to have oversight of the subject's visits and course through relevant discussions, reviews, and also specifically of this visit by routing of this note to the PI.  All procedures completed per the above referenced protocol. Subject tolerated injections well without complaints. Refer to the subjects paper source binder for further details of the visit.      Signed by Carron Curie, CMA, BS,  [facility] Clinical Research Coordinator / Nurse [place], [place] 11:05 AM 09/24/2022

## 2022-10-01 DIAGNOSIS — R82998 Other abnormal findings in urine: Secondary | ICD-10-CM | POA: Diagnosis not present

## 2022-10-01 DIAGNOSIS — K219 Gastro-esophageal reflux disease without esophagitis: Secondary | ICD-10-CM | POA: Diagnosis not present

## 2022-10-01 DIAGNOSIS — Z0000 Encounter for general adult medical examination without abnormal findings: Secondary | ICD-10-CM | POA: Diagnosis not present

## 2022-10-01 DIAGNOSIS — Z1212 Encounter for screening for malignant neoplasm of rectum: Secondary | ICD-10-CM | POA: Diagnosis not present

## 2022-10-01 DIAGNOSIS — J439 Emphysema, unspecified: Secondary | ICD-10-CM | POA: Diagnosis not present

## 2022-10-01 DIAGNOSIS — Z1331 Encounter for screening for depression: Secondary | ICD-10-CM | POA: Diagnosis not present

## 2022-10-01 DIAGNOSIS — I10 Essential (primary) hypertension: Secondary | ICD-10-CM | POA: Diagnosis not present

## 2022-10-01 DIAGNOSIS — E785 Hyperlipidemia, unspecified: Secondary | ICD-10-CM | POA: Diagnosis not present

## 2022-10-01 DIAGNOSIS — Z23 Encounter for immunization: Secondary | ICD-10-CM | POA: Diagnosis not present

## 2022-10-01 DIAGNOSIS — J9611 Chronic respiratory failure with hypoxia: Secondary | ICD-10-CM | POA: Diagnosis not present

## 2022-10-01 DIAGNOSIS — Z1339 Encounter for screening examination for other mental health and behavioral disorders: Secondary | ICD-10-CM | POA: Diagnosis not present

## 2022-10-01 DIAGNOSIS — I700 Atherosclerosis of aorta: Secondary | ICD-10-CM | POA: Diagnosis not present

## 2022-10-01 DIAGNOSIS — I2781 Cor pulmonale (chronic): Secondary | ICD-10-CM | POA: Diagnosis not present

## 2022-10-01 DIAGNOSIS — K5909 Other constipation: Secondary | ICD-10-CM | POA: Diagnosis not present

## 2022-10-01 DIAGNOSIS — Z9981 Dependence on supplemental oxygen: Secondary | ICD-10-CM | POA: Diagnosis not present

## 2022-10-06 DIAGNOSIS — Z23 Encounter for immunization: Secondary | ICD-10-CM | POA: Diagnosis not present

## 2022-10-08 ENCOUNTER — Encounter: Payer: Medicare HMO | Admitting: *Deleted

## 2022-10-08 DIAGNOSIS — Z006 Encounter for examination for normal comparison and control in clinical research program: Secondary | ICD-10-CM

## 2022-10-08 DIAGNOSIS — J441 Chronic obstructive pulmonary disease with (acute) exacerbation: Secondary | ICD-10-CM

## 2022-10-08 MED ORDER — STUDY - ARNASA GB44332 - ASTEGOLIMAB 238 MG/1.7 ML OR PLACEBO SQ INJECTION (PI-RAMASWAMY)
476.0000 mg | INJECTION | SUBCUTANEOUS | Status: DC
  Filled 2022-10-08: qty 3.4

## 2022-10-08 NOTE — Research (Signed)
Title: A Phase III, randomized, double-blind, placebo controlled, multicenter study to evaluate the efficacy and safety of astegolimab in patients with chronic obstructive pulmonary disease    Dose and Duration of Treatment: Astegolimab is presented as a sterile, slightly brown-yellow solution. Each single-use, 2.25 mL pre-filled syringe contains 1.7 mL deliverable volume. Astegolimab drug product is formulated at 140 mg/mL astegolimab with 114 mM succinic acid, 200 mM L-arginine, 10 mM L-methionine, 0.06% (w/v) polysorbate 20, pH 5.7. Placebo for astegolimab is supplied in an identical pre-filled syringe configuration.   Protocol # [id number]   Sponsor: [employer] [place], [place]    [facility] Health Scientist, physiological note:   This visit for Subject Katie Greene with DOB: 1956/04/09 on 10/08/2022 for the above protocol is Visit/Encounter # 13  and is for purpose of research.   The consent for this encounter is under Protocol Version 3, dated 26/May2023, Version 4 dated 20Jun2023 IB: version 8 dated April 2023 ICF: Main version 12Apr2023, revised 17July2023 Mobile Nursing [phone], revised 01Sep2023 Lab Manual: V4.0.0 20Oct2023 and is currently IRB approved.   Subject expressed continued interest and consent in continuing as a study subject. Subject confirmed that there was change in contact information (e.g. address, telephone, email). Subject thanked for participation in research and contribution to science.  The Subject was  informed that the [facility] continues to have oversight of the subject's visits and course through relevant discussions, reviews, and also specifically of this visit by routing of this note to the PI.  All procedures were completed per the above mentioned protocol. Subject tolerated injections well without complaints. Refer to the subjects paper source binder for further details of the visit.    Signed  by [place] Bing, CMA, BS, [place] Coordinator [place], [place] 10:58 AM 10/08/2022

## 2022-10-22 ENCOUNTER — Other Ambulatory Visit: Payer: Self-pay | Admitting: Internal Medicine

## 2022-10-22 ENCOUNTER — Encounter (INDEPENDENT_AMBULATORY_CARE_PROVIDER_SITE_OTHER): Payer: Medicare HMO | Admitting: Internal Medicine

## 2022-10-22 ENCOUNTER — Encounter: Payer: Medicare HMO | Admitting: *Deleted

## 2022-10-22 DIAGNOSIS — J441 Chronic obstructive pulmonary disease with (acute) exacerbation: Secondary | ICD-10-CM

## 2022-10-22 DIAGNOSIS — Z006 Encounter for examination for normal comparison and control in clinical research program: Secondary | ICD-10-CM

## 2022-10-22 MED ORDER — STUDY - ARNASA GB44332 - ASTEGOLIMAB 238 MG/1.7 ML OR PLACEBO SQ INJECTION (PI-RAMASWAMY)
476.0000 mg | INJECTION | SUBCUTANEOUS | Status: DC
  Administered 2022-10-22: 476 mg via SUBCUTANEOUS
  Filled 2022-10-22: qty 3.4

## 2022-10-22 NOTE — Research (Signed)
Title: A Phase III, randomized, double-blind, placebo controlled, multicenter study to evaluate the efficacy and safety of astegolimab in patients with chronic obstructive pulmonary disease    Dose and Duration of Treatment: Astegolimab is presented as a sterile, slightly brown-yellow solution. Each single-use, 2.25 mL pre-filled syringe contains 1.7 mL deliverable volume. Astegolimab drug product is formulated at 140 mg/mL astegolimab with 114 mM succinic acid, 200 mM L-arginine, 10 mM L-methionine, 0.06% (w/v) polysorbate 20, pH 5.7. Placebo for astegolimab is supplied in an identical pre-filled syringe configuration.   Protocol # [id number]   Sponsor: F[employer] [place], [place]   [facility] Health Scientist, physiological note:   This visit for Subject Katie Greene with DOB: 1956/03/08 on 10/22/2022 for the above protocol is Visit/Encounter # 14 Week 24  and is for purpose of research.   The consent for this encounter is under Protocol: Version 3, dated 26/May2023, Version 4 dated 20Jun2023 IB: version 8 dated April 2023 ICF: Main version 12Apr2023, revised [phone] Mobile Nursing [phone], revised 01Sep2023 is  currently IRB approved.    Subject expressed continued interest and consent in continuing as a study subject. Subject confirmed that there was change in contact information (e.g. address, telephone, email). Subject thanked for participation in research and contribution to science. In this visit 10/22/2022 the subject will be evaluated by  Principal Investigator named Dr. Brand Males. This research coordinator has verified that the above investigator is  up to date with his/her training logs.   All procedures and assessments were completed per that above states protocol. Subject tolerated injections well without complaints. Refer to the subjects paper source binder for further details of the visit.    Signed by Delorse Lek CMA,  [place] Coordinator [facility]  [place], [place] 4:37 PM 10/22/2022

## 2022-11-05 ENCOUNTER — Encounter: Payer: Medicare HMO | Admitting: *Deleted

## 2022-11-05 DIAGNOSIS — J441 Chronic obstructive pulmonary disease with (acute) exacerbation: Secondary | ICD-10-CM

## 2022-11-05 DIAGNOSIS — Z006 Encounter for examination for normal comparison and control in clinical research program: Secondary | ICD-10-CM

## 2022-11-05 MED ORDER — STUDY - ARNASA GB44332 - ASTEGOLIMAB 238 MG/1.7 ML OR PLACEBO SQ INJECTION (PI-RAMASWAMY)
476.0000 mg | INJECTION | SUBCUTANEOUS | Status: DC
  Administered 2022-11-05: 476 mg via SUBCUTANEOUS
  Filled 2022-11-05: qty 3.4

## 2022-11-05 NOTE — Research (Signed)
Title: A Phase III, randomized, double-blind, placebo controlled, multicenter study to evaluate the efficacy and safety of astegolimab in patients with chronic obstructive pulmonary disease    Dose and Duration of Treatment: Astegolimab is presented as a sterile, slightly brown-yellow solution. Each single-use, 2.25 mL pre-filled syringe contains 1.7 mL deliverable volume. Astegolimab drug product is formulated at 140 mg/mL astegolimab with 114 mM succinic acid, 200 mM L-arginine, 10 mM L-methionine, 0.06% (w/v) polysorbate 20, pH 5.7. Placebo for astegolimab is supplied in an identical pre-filled syringe configuration.   Protocol # [id number]   Sponsor: F[employer] [address], [place]  [facility] [employer] [facility] Astronomer note:   This visit for Subject Katie Greene with DOB: 1956/06/16 on 11/05/2022 for the above protocol is Visit/Encounter # Visit 15 Week 26  and is for purpose of research.   The consent for this encounter is under Protocol: Version 3, dated 26/May2023, Version 4 dated 20Jun2023 IB: version 8 dated April 2023 ICF: Main version 12Apr2023, revised 17July2023 Mobile Nursing 01Sep2023, revised 01Sep2023 is currently IRB approved.   Subject expressed continued interest and consent in continuing as a study subject. Subject confirmed that there was change in contact information (e.g. address, telephone, email). Subject thanked for participation in research and contribution to science.   The Subject was informed that the PI Dr. Elly Modena continues to have oversight of the subject's visits and course through relevant discussions, reviews, and also specifically of this visit by routing of this note to the PI.  All procedure and assessments were completed per above stated protocol.  Subject tolerated injections well.  Refer to subjects paper source binder for further details of visit.    Signed by  Jerolyn Shin, CMA Clinical  Research Coordinator [facility]  [place], [place] 11/05/2022

## 2022-11-19 ENCOUNTER — Encounter: Payer: Medicare HMO | Admitting: Internal Medicine

## 2022-11-19 DIAGNOSIS — J441 Chronic obstructive pulmonary disease with (acute) exacerbation: Secondary | ICD-10-CM

## 2022-11-19 DIAGNOSIS — Z006 Encounter for examination for normal comparison and control in clinical research program: Secondary | ICD-10-CM

## 2022-11-19 MED ORDER — STUDY - ARNASA GB44332 - ASTEGOLIMAB 238 MG/1.7 ML OR PLACEBO SQ INJECTION (PI-RAMASWAMY)
476.0000 mg | INJECTION | SUBCUTANEOUS | Status: DC
  Administered 2022-11-19: 476 mg via SUBCUTANEOUS
  Filled 2022-11-19: qty 3.4

## 2022-11-19 NOTE — Research (Signed)
Title: A Phase III, randomized, double-blind, placebo controlled, multicenter study to evaluate the efficacy and safety of astegolimab in patients with chronic obstructive pulmonary disease    Dose and Duration of Treatment: Astegolimab is presented as a sterile, slightly brown-yellow solution. Each single-use, 2.25 mL pre-filled syringe contains 1.7 mL deliverable volume. Astegolimab drug product is formulated at 140 mg/mL astegolimab with 114 mM succinic acid, 200 mM L-arginine, 10 mM L-methionine, 0.06% (w/v) polysorbate 20, pH 5.7. Placebo for astegolimab is supplied in an identical pre-filled syringe configuration.   Protocol # [id number]   Sponsor: [employer] [address] [place]     Designer, fashion/clothing Product: Astegolimab [phone])  Protocol: Version 3, dated 26/May2023, Version 4 dated 20Jun2023 IB: version 8 dated April 2023 ICF: Main version 12Apr2023, revised 17July2023 Mobile Nursing 01Sep2023, revised 01Sep2023 Lab Manual: V4.0.0 20Oct2023 Mechanism of action:  Astegolimab (also known as [id number] or [id number]) is a fully human, IgG2 monoclonal antibody that binds with high affinity to the interleukin (IL)-33 (IL-33) receptor, ST2, thereby blocking the signaling of IL-33, an inflammatory cytokine of the IL-1 family and member of the "alarmin" class of molecules. Astegolimab binds with high affinity to the human and cynomolgus monkey receptor for IL-33, ST2, and blocks IL-33 binding, thus inhibiting association with the IL-1R accessory protein (AcP) co-receptor and formation of an activated receptor complex.   Key Inclusion Criteria: Age 51-80 years at Visit 1   Documented COPD diagnosis for ?12 months prior to Visit 1 History of frequent exacerbations, defined as having had 2 or more moderate or severe COPD exacerbations within 12 months prior to Visit 1 Post-bronchodilator FEV1 ?20% and <80% of predicted at Visit 1 or Visit 2   Post-bronchodilator FEV1/FVC <0.70 at Visit 1 or Visit 2  mMRC score ?2 at screening   Current tobacco smoker or former smoker (having stopped smoking for at least 6 months prior to Visit 1) with a history of smoking ?10 pack-years On optimized COPD maintenance therapy as defined below for ?12 months prior to Visit 1            -Inhaled corticosteroid (ICS) plus long-acting beta-agonist (LABA)           -Long-acting muscarinic antagonist (LAMA) plus LABA           -ICS plus LAMA plus LABA  Key Exclusion Criteria: Current documented diagnosis of asthma  Diagnosis of a-1 antitrypsin deficiency  History of long-term treatment with oxygen at >4.0 liters/minute  Any infection that resulted in hospital admission for ?24 hours and/or treatment with oral, IV, or IM antibiotics within 4 weeks prior to Visit 1 or during screening Upper or lower respiratory tract infection within 4 weeks prior to Visit 1 or during screening Treatment with oral, IV, or IM corticosteroids (>10 mg/day prednisolone or equivalent) within 4 weeks prior to initiation of study drug Treatment with a licensed biologic agent (e.g., omalizumab, dupilumab, and/or anti-IL-5 therapies) within 3 months or 5 drug-elimination half-lives (whichever is longer) prior to screening  Planned surgical intervention during the study Known immunodeficiency, including but not limited to, HIV infection  AST, ALT, or total bilirubin elevation ?2.0 x the upper limit of normal (ULN) during screening  History of malignancy within 5 years prior to screening, with the exception of malignancies with a negligible risk of metastasis or death (e.g., 5-year overall survival rate >90%), such as adequately treated carcinoma in situ of the cervix, non-melanoma carcinoma, localized prostate cancer, or ductal carcinoma in situ  Unstable cardiac disease, myocardial infarction, or New York Heart Association Class III or IV heart failure within 12 months prior to  screening History or absence of an abnormal ECG that is deemed clinically significant by the investigator, including complete left bundle branch block or second- or third-degree atrioventricular heart block     Integrated Pharmacokinetic/Pharmacodynamic Analysis:  In Studies [id number], [id number], and [id number], exploratory biomarker analysis showed there were consistent decreases in blood eosinophil counts throughout the treatment period, potentially mediated by a direct effect of IL-33 on eosinophil progenitors. In [place], there was no significant difference in fractional exhaled nitric oxide (FeNO) levels (reflecting airway IL-4/IL-13 activity) between astegolimab-treated groups relative to placebo throughout the treatment period. These data suggest that astegolimab has only a limited effect on Type 2 inflammation in asthma. No data are applicable for Study [id number]. No data are available yet for Study [id number].  Special Warnings/Considerations: Administration of astegolimab, a protein therapeutic, may lead to the development of anti-astegolimab antibodies which could lead to AEs and/or decreased exposure. In non-clinical studies (see Section 4.2), ADA incidence has generally been low and there was no apparent ADA impact on PK and safety in these studies. To date, the immunogenicity rates observed with astegolimab in clinical studies have been relatively low as well (see Immunogenicity Section 5.6). Several clinical studies have been conducted in patients with asthma, atopic dermatitis, COPD (IIS Study [id number]) and COVID-19 severe pneumonia, which generally showed low incidence of ADAs (Table 31). There has been no correlation between ADA status and clinical findings or increased incidence of AEs. Astegolimab is now being considered in a larger study for the treatment of COPD. This patient population is typically considered to have a hyper-responsive immune system. Route of administration for this molecule  will be [place], either Q2W or Q4W. These factors increase the risk of development of an immune response to astegolimab, specifically with repeat dosing. From the previous IIS Study [id number], incidence of ADAs was low in COPD patients; this remains to be confirmed in a larger study. To monitor ADA development in ongoing studies, serum samples will be collected from patients at protocol-defined intervals. Patients who test positive for antibodies and have clinical sequelae that are considered potentially related to an ADA response may also be asked to return for additional follow-up testing.  Drug Interaction Studies: No PK drug interaction studies have been conducted to date.  Serious Adverse Reactions Observed in Asthma Study: During the treatment period, a similar proportion of patients across all cohorts experienced at least one AE, regardless of causality (Table 22). In total, 77.2%, 70.9%, 72.2%, and 72.1% of patients reported at least 1 AE in the placebo, 70 mg, 210 mg, and 490 mg groups, respectively. The most common AE's (>5% in any treatment group) were asthma, nasopharyngitis, upper respiratory tract infection, headache, and injection site reaction (ISR). The most common drug-related AE was ISR, which was reported more frequently in the astegolimab treatment groups than in the placebo group (1 [0.8%] patient in the placebo group, 10 [7.9%] patients with 70 mg, 8 [6.3%] patients with 210 mg, 6 [4.9%] patients with 490 mg). All ISRs were non-serious and mild or moderate in severity. During the treatment period, 50 SAEs were reported in 37 (7.4%) patients. The number of patients reporting SAEs was comparable across all cohorts (11 SAEs in 8 patients on placebo, 21 SAEs in 14 patients on 70 mg, 11 SAEs in 9 patients on 210 mg, and 7 SAEs in 6 patients on 490 mg). The  most common SAE was asthma. One SAE of moderate livedo reticularis (70 mg) was considered a suspected unexpected serious adverse drug reaction  (SUSAR) related to astegolimab and was reported two days after the second dose, leading to discontinuation of astegolimab. Two (0.4%) patients reported anaphylaxis and hypersensitivity reactions: 1 severe SAE of anaphylactic reaction (placebo), and 1 moderate hypersensitivity (490 mg) considered related to astegolimab. Three (0.6%) patients experienced a potential Major Adverse Cardiac Event (MACE) (1 patient each in the placebo, 70 mg, and 210 mg groups). None of the potential MACE were considered related to astegolimab. Overall, 233 (46.4%) patients reported events of infection. A comparable number of patients reported infection across all treatment groups (65 [51.2%] patients on placebo, 55 [43.3%] patients on 70 mg, 58 [46.0%] patients on 210 mg, and 55 [45.1%] patients on 490 mg). The most frequently reported infection (?10% incidence) was nasopharyngitis (12.7%). One patient on astegolimab 210 mg and the partner of one patient on placebo became pregnant during the study. Both delivered normal/healthy babies. Two deaths, unrelated to study drug, were reported: one patient on 210 mg astegolimab died following an SAE of asthma; the other patient on 490 mg astegolimab had an unexplained death. There were no clinically meaningful changes in laboratory parameters, vital signs, or ECG results, other than the 10% decrease in mean blood eosinophil counts in the astegolimab-treated groups, with no safety concerns. Treatment-induced ADAs were comparable between the astegolimab groups and had no impact on safety. Overall, astegolimab was well tolerated at all doses used and had a safety profile consistent with that observed in the previous astegolimab Phase I studies.  Serious Adverse Reactions Observed in Previous COPD Study: In the completed IIS Study [id number], a total of 81 patients received at least one dose of astegolimab or placebo. The safety profile of astegolimab was similar to that of placebo. There were a  total of 222 AEs reported in 62 patients. A total of 28 (72%) patients in the placebo arm and 34 (81%) patients in the astegolimab arm reported at least one AE. The most commonly reported AEs were headache followed by urinary tract infection and viral upper respiratory tract infection. A total of 39 SAEs were reported in 28 (35%) patients; 16 (41%) patients in the placebo group and 12 (29%) in the astegolimab group. The most commonly reported SAE was hospital admission for community acquired pneumonia. Four SAEs resulted in patient discontinuation from treatment (1 patient in the placebo group and 3 patients in the astegolimab group). One patient in the placebo group experienced an AESI of potential MACE (heart failure, unrelated to the study treatment). No anaphylaxis or pregnancies were reported. Two deaths, unrelated to study treatment, were reported: 1 patient in the placebo group died after hospital acquired pneumonia, and another patient in the placebo group died after pneumonia and type 2 respiratory failure.  Safety Data: Astegolimab has been generally well tolerated. There have been 49 patient deaths across all astegolimab studies (Section 5.5.2), none of which were considered related to astegolimab. A total of 144 subjects experienced a total of 224 SAEs across all astegolimab studies. Of these, an SAE livedo reticularis observed in 1 patient was considered related to astegolimab by the investigator (Section 5.5.3). AEs leading to withdrawal (Section 5.5.4) have generally occurred at a rate similar to what would be expected for clinical trials in the studies' respective indications.   [facility] Clinical Research Coordinator note :    This visit for Subject [id number] with DOB: [birth year] on  18/11/2022 for the above protocol is Visit/Encounter # 16 and is for purpose of research.    The consent for this encounter is under Protocol Version 4.0 , Investigator Brochure Version 8, Consent  Version IRB Approved  revised  and is currently IRB approved.    Subject expressed continued interest and consent in continuing as a study subject. Subject confirmed that there was no change in contact information (e.g. address, telephone, email). Subject thanked for participation in research and contribution to science.    The Subject was informed that the PI Dr. Marchelle Gearing continues to have oversight of the subject's visits and course  through relevant discussions, reviews and also specifically of this visit by routing of this note to the PI.    [facility] Clinical Research Coordinator note:   This visit for Subject EULANDA DORION with DOB: [birth year] on 11/19/2022 for the above protocol is Visit/Encounter # 16  and is for purpose of research.   The (Subject) expressed continued interest and consent in continuing as a study subject. Subject confirmed that there was  no   change in contact information (e.g. address, telephone, email). Subject thanked for participation in research and contribution to science.. This research coordinator has verified that the above investigator is yes up to date with his/her training logs.   The Subject was yes  informed that the PI Kalman Shan, MD continues to have oversight of the subject's visits and course through relevant discussions, reviews, and also specifically of this visit by routing of this note to the PI.  All procedure and assessments were completed per above stated protocol.  Subject tolerated injection well.  Refer to subjects paper source binder for further details of this visit.   Signed by  Jerolyn Shin, CMA  Clinical Research Coordinator / Nurse [place], [place] 11:43 AM 11/19/2022

## 2022-11-30 ENCOUNTER — Other Ambulatory Visit: Payer: Self-pay | Admitting: Internal Medicine

## 2022-12-03 ENCOUNTER — Encounter: Payer: Medicare HMO | Admitting: *Deleted

## 2022-12-03 DIAGNOSIS — J441 Chronic obstructive pulmonary disease with (acute) exacerbation: Secondary | ICD-10-CM

## 2022-12-03 DIAGNOSIS — Z006 Encounter for examination for normal comparison and control in clinical research program: Secondary | ICD-10-CM

## 2022-12-03 MED ORDER — STUDY - ARNASA GB44332 - ASTEGOLIMAB 238 MG/1.7 ML OR PLACEBO SQ INJECTION (PI-RAMASWAMY)
476.0000 mg | INJECTION | SUBCUTANEOUS | Status: DC
  Administered 2022-12-03: 476 mg via SUBCUTANEOUS
  Filled 2022-12-03: qty 3.4

## 2022-12-03 NOTE — Research (Signed)
Title: A Phase III, randomized, double-blind, placebo controlled, multicenter study to evaluate the efficacy and safety of astegolimab in patients with chronic obstructive pulmonary disease    Dose and Duration of Treatment: Astegolimab is presented as a sterile, slightly brown-yellow solution. Each single-use, 2.25 mL pre-filled syringe contains 1.7 mL deliverable volume. Astegolimab drug product is formulated at 140 mg/mL astegolimab with 114 mM succinic acid, 200 mM L-arginine, 10 mM L-methionine, 0.06% (w/v) polysorbate 20, pH 5.7. Placebo for astegolimab is supplied in an identical pre-filled syringe configuration.   Protocol # [id number]   Sponsor: [employer] [address], [place]   Protocol: Version 3, dated 26/May2023, Version 4 dated 20Jun2023 IB: version 8 dated April 2023 ICF: Main version 12Apr2023, revised 17July2023 Mobile Nursing 01Sep2023, revised 01Sep2023 Lab Manual: V4.0.0 20Oct2023   Investigator Brochure Product: Astegolimab [id number])   Mechanism of action: Astegolimab (also known as [id number] or [id number]) is a fully human, IgG2 monoclonal antibody that binds with high affinity to the interleukin (IL)-33 (IL-33) receptor, ST2, thereby blocking the signaling of IL-33, an inflammatory cytokine of the IL-1 family and member of the "alarmin" class of molecules. Astegolimab binds with high affinity to the human and cynomolgus monkey receptor for IL-33, ST2, and blocks IL-33 binding, thus inhibiting association with the IL-1R accessory protein (AcP) co-receptor and formation of an activated receptor complex.    Integrated Pharmacokinetic/Pharmacodynamic Analysis:  In Studies [id number], [id number], and [id number], exploratory biomarker analysis showed there were consistent decreases in blood eosinophil counts throughout the treatment period, potentially mediated by a direct effect of IL-33 on eosinophil progenitors. In [place], there was no  significant difference in fractional exhaled nitric oxide (FeNO) levels (reflecting airway IL-4/IL-13 activity) between astegolimab-treated groups relative to placebo throughout the treatment period. These data suggest that astegolimab has only a limited effect on Type 2 inflammation in asthma. No data are applicable for Study [id number]. No data are available yet for Study [id number].  Special Warnings/Considerations: Administration of astegolimab, a protein therapeutic, may lead to the development of anti-astegolimab antibodies which could lead to AEs and/or decreased exposure. In non-clinical studies (see Section 4.2), ADA incidence has generally been low and there was no apparent ADA impact on PK and safety in these studies. To date, the immunogenicity rates observed with astegolimab in clinical studies have been relatively low as well (see Immunogenicity Section 5.6). Several clinical studies have been conducted in patients with asthma, atopic dermatitis, COPD (IIS Study [id number]) and COVID-19 severe pneumonia, which generally showed low incidence of ADAs (Table 31). There has been no correlation between ADA status and clinical findings or increased incidence of AEs. Astegolimab is now being considered in a larger study for the treatment of COPD. This patient population is typically considered to have a hyper-responsive immune system. Route of administration for this molecule will be [place], either Q2W or Q4W. These factors increase the risk of development of an immune response to astegolimab, specifically with repeat dosing. From the previous IIS Study [id number], incidence of ADAs was low in COPD patients; this remains to be confirmed in a larger study. To monitor ADA development in ongoing studies, serum samples will be collected from patients at protocol-defined intervals. Patients who test positive for antibodies and have clinical sequelae that are considered potentially related to an ADA response may also be asked  to return for additional follow-up testing.  Drug Interaction Studies: No PK drug interaction studies have been conducted to date.  Serious Adverse Reactions Observed in Asthma  Study: During the treatment period, a similar proportion of patients across all cohorts experienced at least one AE, regardless of causality (Table 22). In total, 77.2%, 70.9%, 72.2%, and 72.1% of patients reported at least 1 AE in the placebo, 70 mg, 210 mg, and 490 mg groups, respectively. The most common AE's (>5% in any treatment group) were asthma, nasopharyngitis, upper respiratory tract infection, headache, and injection site reaction (ISR). The most common drug-related AE was ISR, which was reported more frequently in the astegolimab treatment groups than in the placebo group (1 [0.8%] patient in the placebo group, 10 [7.9%] patients with 70 mg, 8 [6.3%] patients with 210 mg, 6 [4.9%] patients with 490 mg). All ISRs were non-serious and mild or moderate in severity. During the treatment period, 50 SAEs were reported in 37 (7.4%) patients. The number of patients reporting SAEs was comparable across all cohorts (11 SAEs in 8 patients on placebo, 21 SAEs in 14 patients on 70 mg, 11 SAEs in 9 patients on 210 mg, and 7 SAEs in 6 patients on 490 mg). The most common SAE was asthma. One SAE of moderate livedo reticularis (70 mg) was considered a suspected unexpected serious adverse drug reaction (SUSAR) related to astegolimab and was reported two days after the second dose, leading to discontinuation of astegolimab. Two (0.4%) patients reported anaphylaxis and hypersensitivity reactions: 1 severe SAE of anaphylactic reaction (placebo), and 1 moderate hypersensitivity (490 mg) considered related to astegolimab. Three (0.6%) patients experienced a potential Major Adverse Cardiac Event (MACE) (1 patient each in the placebo, 70 mg, and 210 mg groups). None of the potential MACE were considered related to astegolimab. Overall, 233 (46.4%)  patients reported events of infection. A comparable number of patients reported infection across all treatment groups (65 [51.2%] patients on placebo, 55 [43.3%] patients on 70 mg, 58 [46.0%] patients on 210 mg, and 55 [45.1%] patients on 490 mg). The most frequently reported infection (?10% incidence) was nasopharyngitis (12.7%). One patient on astegolimab 210 mg and the partner of one patient on placebo became pregnant during the study. Both delivered normal/healthy babies. Two deaths, unrelated to study drug, were reported: one patient on 210 mg astegolimab died following an SAE of asthma; the other patient on 490 mg astegolimab had an unexplained death. There were no clinically meaningful changes in laboratory parameters, vital signs, or ECG results, other than the 10% decrease in mean blood eosinophil counts in the astegolimab-treated groups, with no safety concerns. Treatment-induced ADAs were comparable between the astegolimab groups and had no impact on safety. Overall, astegolimab was well tolerated at all doses used and had a safety profile consistent with that observed in the previous astegolimab Phase I studies.  Serious Adverse Reactions Observed in Previous COPD Study: In the completed IIS Study [id number], a total of 81 patients received at least one dose of astegolimab or placebo. The safety profile of astegolimab was similar to that of placebo. There were a total of 222 AEs reported in 62 patients. A total of 28 (72%) patients in the placebo arm and 34 (81%) patients in the astegolimab arm reported at least one AE. The most commonly reported AEs were headache followed by urinary tract infection and viral upper respiratory tract infection. A total of 39 SAEs were reported in 28 (35%) patients; 16 (41%) patients in the placebo group and 12 (29%) in the astegolimab group. The most commonly reported SAE was hospital admission for community acquired pneumonia. Four SAEs resulted in patient  discontinuation from treatment (1  patient in the placebo group and 3 patients in the astegolimab group). One patient in the placebo group experienced an AESI of potential MACE (heart failure, unrelated to the study treatment). No anaphylaxis or pregnancies were reported. Two deaths, unrelated to study treatment, were reported: 1 patient in the placebo group died after hospital acquired pneumonia, and another patient in the placebo group died after pneumonia and type 2 respiratory failure.  Safety Data: Astegolimab has been generally well tolerated. There have been 49 patient deaths across all astegolimab studies (Section 5.5.2), none of which were considered related to astegolimab. A total of 144 subjects experienced a total of 224 SAEs across all astegolimab studies. Of these, an SAE livedo reticularis observed in 1 patient was considered related to astegolimab by the investigator (Section 5.5.3). AEs leading to withdrawal (Section 5.5.4) have generally occurred at a rate similar to what would be expected for clinical trials in the studies' respective indications.   [facility] Clinical Research Coordinator note :    This visit for Subject [id number] with DOB: [birth year] on 09/07/2022 for the above protocol is Visit/Encounter #  and is for purpose of research.    The consent for this encounter is under Protocol Version 2.0 , Investigator Brochure Version 7, Consent Version IRB Approved  revised  and is currently IRB approved.    Subject expressed continued interest and consent in continuing as a study subject. Subject confirmed that there was no change in contact information (e.g. address, telephone, email). Subject thanked for participation in research and contribution to science.  This research coordinator has verified that the above investigator is up to date with his/her training logs.    The Subject was informed that the PI Dr. Marchelle Gearing continues to have oversight of the subject's visits and  course  through relevant discussions, reviews and also specifically of this visit by routing of this note to the PI.  [facility] Clinical Research Coordinator note:   This visit for Subject Katie Greene with DOB: 1955-11-02 on 12/03/2022 for the above protocol is Visit/Encounter # 17  and is for purpose of research.   Subject expressed continued interest and consent in continuing as a study subject. Subject confirmed that there was  no  change in contact information (e.g. address, telephone, email). Subject thanked for participation in research and contribution to science. In this visit 12/03/2022 the subject will be evaluated by Principal Investigator   Kalman Shan, MD. This research coordinator has verified that the above investigator is up to date with his/her training logs.   The Subject was informed that the PI  continues to have oversight of the subject's visits and course through relevant discussions, reviews, and also specifically of this visit by routing of this note to the PI.  All procedure and assessments were completed per above stated protocol.  Subject tolerated injection well.  Refer to subjects paper source binder for further details of the visit.   Signed by  Jerolyn Shin, CMA  Clinical Research Coordinator  [facility]  [place], [place] 11:46 AM 12/03/2022

## 2022-12-17 ENCOUNTER — Encounter: Payer: Medicare HMO | Admitting: *Deleted

## 2022-12-17 DIAGNOSIS — J441 Chronic obstructive pulmonary disease with (acute) exacerbation: Secondary | ICD-10-CM

## 2022-12-17 DIAGNOSIS — Z006 Encounter for examination for normal comparison and control in clinical research program: Secondary | ICD-10-CM

## 2022-12-17 MED ORDER — STUDY - ARNASA GB44332 - ASTEGOLIMAB 238 MG/1.7 ML OR PLACEBO SQ INJECTION (PI-RAMASWAMY)
476.0000 mg | INJECTION | SUBCUTANEOUS | Status: DC
  Administered 2022-12-17: 476 mg via SUBCUTANEOUS
  Filled 2022-12-17: qty 3.4

## 2022-12-17 NOTE — Research (Signed)
Title: A Phase III, randomized, double-blind, placebo controlled, multicenter study to evaluate the efficacy and safety of astegolimab in patients with chronic obstructive pulmonary disease    Dose and Duration of Treatment: Astegolimab is presented as a sterile, slightly brown-yellow solution. Each single-use, 2.25 mL pre-filled syringe contains 1.7 mL deliverable volume. Astegolimab drug product is formulated at 140 mg/mL astegolimab with 114 mM succinic acid, 200 mM L-arginine, 10 mM L-methionine, 0.06% (w/v) polysorbate 20, pH 5.7. Placebo for astegolimab is supplied in an identical pre-filled syringe configuration.   Protocol # [id number]  Protocol Version: Version 2 Dated 24Dec2022 IB: Version 6.0 Dated 27Sep2023 ICF:  Main ICF: Version 13Feb2023,  revised 22May23 Inf[facility]: version 13Feb2023, revised 22May23 Open label: version13Feb2023, Revised 28 Nov 2021 Mobile Nursing: version 23May2023, Revised 04 Mar 2022 Lab Manual: V2.2.0 dated 23Feb2024  Mechanism of action: Astegolimab (also known as [id number] or [id number]) is a fully human, IgG2 monoclonal antibody that binds with high affinity to the interleukin (IL)-33 (IL-33) receptor, ST2, thereby blocking the signaling of IL-33, an inflammatory cytokine of the IL-1 family and member of the "alarmin" class of molecules. Astegolimab binds with high affinity to the human and cynomolgus monkey receptor for IL-33, ST2, and blocks IL-33 binding, thus inhibiting association with the IL-1R accessory protein (AcP) co-receptor and formation of an activated receptor complex.    Integrated Pharmacokinetic/Pharmacodynamic Analysis:  In Studies [id number], [id number], and [id number], exploratory biomarker analysis showed there were consistent decreases in blood eosinophil counts throughout the treatment period, potentially mediated by a direct effect of IL-33 on eosinophil progenitors. In [place], there was no significant difference in fractional exhaled  nitric oxide (FeNO) levels (reflecting airway IL-4/IL-13 activity) between astegolimab-treated groups relative to placebo throughout the treatment period. These data suggest that astegolimab has only a limited effect on Type 2 inflammation in asthma. No data are applicable for Study [id number]. No data are available yet for Study [id number].  Special Warnings/Considerations: Administration of astegolimab, a protein therapeutic, may lead to the development of anti-astegolimab antibodies which could lead to AEs and/or decreased exposure. In non-clinical studies (see Section 4.2), ADA incidence has generally been low and there was no apparent ADA impact on PK and safety in these studies. To date, the immunogenicity rates observed with astegolimab in clinical studies have been relatively low as well (see Immunogenicity Section 5.6). Several clinical studies have been conducted in patients with asthma, atopic dermatitis, COPD (IIS Study [id number]) and COVID-19 severe pneumonia, which generally showed low incidence of ADAs (Table 31). There has been no correlation between ADA status and clinical findings or increased incidence of AEs. Astegolimab is now being considered in a larger study for the treatment of COPD. This patient population is typically considered to have a hyper-responsive immune system. Route of administration for this molecule will be [place], either Q2W or Q4W. These factors increase the risk of development of an immune response to astegolimab, specifically with repeat dosing. From the previous IIS Study [id number], incidence of ADAs was low in COPD patients; this remains to be confirmed in a larger study. To monitor ADA development in ongoing studies, serum samples will be collected from patients at protocol-defined intervals. Patients who test positive for antibodies and have clinical sequelae that are considered potentially related to an ADA response may also be asked to return for additional follow-up  testing.  Drug Interaction Studies: No PK drug interaction studies have been conducted to date.  Serious Adverse Reactions Observed in Asthma Study: During the treatment period, a  similar proportion of patients across all cohorts experienced at least one AE, regardless of causality (Table 22). In total, 77.2%, 70.9%, 72.2%, and 72.1% of patients reported at least 1 AE in the placebo, 70 mg, 210 mg, and 490 mg groups, respectively. The most common AE's (>5% in any treatment group) were asthma, nasopharyngitis, upper respiratory tract infection, headache, and injection site reaction (ISR). The most common drug-related AE was ISR, which was reported more frequently in the astegolimab treatment groups than in the placebo group (1 [0.8%] patient in the placebo group, 10 [7.9%] patients with 70 mg, 8 [6.3%] patients with 210 mg, 6 [4.9%] patients with 490 mg). All ISRs were non-serious and mild or moderate in severity. During the treatment period, 50 SAEs were reported in 37 (7.4%) patients. The number of patients reporting SAEs was comparable across all cohorts (11 SAEs in 8 patients on placebo, 21 SAEs in 14 patients on 70 mg, 11 SAEs in 9 patients on 210 mg, and 7 SAEs in 6 patients on 490 mg). The most common SAE was asthma. One SAE of moderate livedo reticularis (70 mg) was considered a suspected unexpected serious adverse drug reaction (SUSAR) related to astegolimab and was reported two days after the second dose, leading to discontinuation of astegolimab. Two (0.4%) patients reported anaphylaxis and hypersensitivity reactions: 1 severe SAE of anaphylactic reaction (placebo), and 1 moderate hypersensitivity (490 mg) considered related to astegolimab. Three (0.6%) patients experienced a potential Major Adverse Cardiac Event (MACE) (1 patient each in the placebo, 70 mg, and 210 mg groups). None of the potential MACE were considered related to astegolimab. Overall, 233 (46.4%) patients reported events of infection.  A comparable number of patients reported infection across all treatment groups (65 [51.2%] patients on placebo, 55 [43.3%] patients on 70 mg, 58 [46.0%] patients on 210 mg, and 55 [45.1%] patients on 490 mg). The most frequently reported infection (?10% incidence) was nasopharyngitis (12.7%). One patient on astegolimab 210 mg and the partner of one patient on placebo became pregnant during the study. Both delivered normal/healthy babies. Two deaths, unrelated to study drug, were reported: one patient on 210 mg astegolimab died following an SAE of asthma; the other patient on 490 mg astegolimab had an unexplained death. There were no clinically meaningful changes in laboratory parameters, vital signs, or ECG results, other than the 10% decrease in mean blood eosinophil counts in the astegolimab-treated groups, with no safety concerns. Treatment-induced ADAs were comparable between the astegolimab groups and had no impact on safety. Overall, astegolimab was well tolerated at all doses used and had a safety profile consistent with that observed in the previous astegolimab Phase I studies.  Serious Adverse Reactions Observed in Previous COPD Study: In the completed IIS Study [id number], a total of 81 patients received at least one dose of astegolimab or placebo. The safety profile of astegolimab was similar to that of placebo. There were a total of 222 AEs reported in 62 patients. A total of 28 (72%) patients in the placebo arm and 34 (81%) patients in the astegolimab arm reported at least one AE. The most commonly reported AEs were headache followed by urinary tract infection and viral upper respiratory tract infection. A total of 39 SAEs were reported in 28 (35%) patients; 16 (41%) patients in the placebo group and 12 (29%) in the astegolimab group. The most commonly reported SAE was hospital admission for community acquired pneumonia. Four SAEs resulted in patient discontinuation from treatment (1 patient in the  placebo group and  3 patients in the astegolimab group). One patient in the placebo group experienced an AESI of potential MACE (heart failure, unrelated to the study treatment). No anaphylaxis or pregnancies were reported. Two deaths, unrelated to study treatment, were reported: 1 patient in the placebo group died after hospital acquired pneumonia, and another patient in the placebo group died after pneumonia and type 2 respiratory failure.  Safety Data: Astegolimab has been generally well tolerated. There have been 49 patient deaths across all astegolimab studies (Section 5.5.2), none of which were considered related to astegolimab. A total of 144 subjects experienced a total of 224 SAEs across all astegolimab studies. Of these, an SAE livedo reticularis observed in 1 patient was considered related to astegolimab by the investigator (Section 5.5.3). AEs leading to withdrawal (Section 5.5.4) have generally occurred at a rate similar to what would be expected for clinical trials in the studies' respective indications.   [facility] Clinical Research Coordinator note :    This visit for Subject [id number] with DOB: April 19, 1956 on 17 Dec 2022 for the above protocol is Visit/Encounter # 18 week 32  and is for purpose of research.    The consent for this encounter is under Protocol Version 2.0 , Investigator Brochure Version 7, Consent Version IRB Approved  revised  and is currently IRB approved.    Subject expressed continued interest and consent in continuing as a study subject. Subject confirmed that there was no change in contact information (e.g. address, telephone, email). Subject thanked for participation in research and contribution to science.  In this visit  the subject will be evaluated by  Carmin Muskrat Ramaswamy,MD This research coordinator has verified that the above investigator is up to date with his/her training logs.    The Subject was informed that the PI Dr. Marchelle Gearing continues to have  oversight of the subject's visits and course  through relevant discussions, reviews and also specifically of this visit by routing of this note to the PI.    All procedures and assessments were completed per above stated protocol.  Subject tolerated injection well.  Refer to subjects paper source binder for further details of this visit.   Signed by Jerolyn Shin BA, CMA Clinical Research Coordinator  [facility]  [place], [place]

## 2022-12-30 MED ORDER — STUDY - ARNASA GB44332 - ASTEGOLIMAB 238 MG/1.7 ML OR PLACEBO SQ INJECTION (PI-RAMASWAMY)
476.0000 mg | INJECTION | SUBCUTANEOUS | Status: DC
  Administered 2022-12-31: 476 mg via SUBCUTANEOUS
  Filled 2022-12-30: qty 3.4

## 2022-12-31 ENCOUNTER — Encounter: Payer: Medicare HMO | Admitting: *Deleted

## 2022-12-31 DIAGNOSIS — J441 Chronic obstructive pulmonary disease with (acute) exacerbation: Secondary | ICD-10-CM

## 2022-12-31 DIAGNOSIS — Z006 Encounter for examination for normal comparison and control in clinical research program: Secondary | ICD-10-CM

## 2022-12-31 NOTE — Research (Cosign Needed Addendum)
Title: A Phase III, randomized, double-blind, placebo controlled, multicenter study to evaluate the efficacy and safety of astegolimab in patients with chronic obstructive pulmonary disease    Dose and Duration of Treatment: Astegolimab is presented as a sterile, slightly brown-yellow solution. Each single-use, 2.25 mL pre-filled syringe contains 1.7 mL deliverable volume. Astegolimab drug product is formulated at 140 mg/mL astegolimab with 114 mM succinic acid, 200 mM L-arginine, 10 mM L-methionine, 0.06% (w/v) polysorbate 20, pH 5.7. Placebo for astegolimab is supplied in an identical pre-filled syringe configuration.   Protocol # [id number]   Sponsor: [employer] [address] [place]   Designer, fashion/clothing Product: Astegolimab [phone])   Protocol: Version 3, dated 26/May2023, Version 4 dated 20Jun2023 IB: version 8 dated April 2023 ICF: Main version 12Apr2023, revised 17July2023 Mobile Nursing 01Sep2023, revised 01Sep2023 Lab Manual: V4.0.0 20Oct2023  Mechanism of action: Astegolimab (also known as [id number] or [id number]) is a fully human, IgG2 monoclonal antibody that binds with high affinity to the interleukin (IL)-33 (IL-33) receptor, ST2, thereby blocking the signaling of IL-33, an inflammatory cytokine of the IL-1 family and member of the "alarmin" class of molecules. Astegolimab binds with high affinity to the human and cynomolgus monkey receptor for IL-33, ST2, and blocks IL-33 binding, thus inhibiting association with the IL-1R accessory protein (AcP) co-receptor and formation of an activated receptor complex.      Integrated Pharmacokinetic/Pharmacodynamic Analysis:  In Studies [id number], [id number], and [id number], exploratory biomarker analysis showed there were consistent decreases in blood eosinophil counts throughout the treatment period, potentially mediated by a direct effect of IL-33 on eosinophil progenitors. In [place], there was no  significant difference in fractional exhaled nitric oxide (FeNO) levels (reflecting airway IL-4/IL-13 activity) between astegolimab-treated groups relative to placebo throughout the treatment period. These data suggest that astegolimab has only a limited effect on Type 2 inflammation in asthma. No data are applicable for Study [id number]. No data are available yet for Study [id number].  Special Warnings/Considerations: Administration of astegolimab, a protein therapeutic, may lead to the development of anti-astegolimab antibodies which could lead to AEs and/or decreased exposure. In non-clinical studies (see Section 4.2), ADA incidence has generally been low and there was no apparent ADA impact on PK and safety in these studies. To date, the immunogenicity rates observed with astegolimab in clinical studies have been relatively low as well (see Immunogenicity Section 5.6). Several clinical studies have been conducted in patients with asthma, atopic dermatitis, COPD (IIS Study [id number]) and COVID-19 severe pneumonia, which generally showed low incidence of ADAs (Table 31). There has been no correlation between ADA status and clinical findings or increased incidence of AEs. Astegolimab is now being considered in a larger study for the treatment of COPD. This patient population is typically considered to have a hyper-responsive immune system. Route of administration for this molecule will be [place], either Q2W or Q4W. These factors increase the risk of development of an immune response to astegolimab, specifically with repeat dosing. From the previous IIS Study [id number], incidence of ADAs was low in COPD patients; this remains to be confirmed in a larger study. To monitor ADA development in ongoing studies, serum samples will be collected from patients at protocol-defined intervals. Patients who test positive for antibodies and have clinical sequelae that are considered potentially related to an ADA response may also be asked  to return for additional follow-up testing.  Drug Interaction Studies: No PK drug interaction studies have been conducted to date.  Serious Adverse Reactions Observed in  Asthma Study: During the treatment period, a similar proportion of patients across all cohorts experienced at least one AE, regardless of causality (Table 22). In total, 77.2%, 70.9%, 72.2%, and 72.1% of patients reported at least 1 AE in the placebo, 70 mg, 210 mg, and 490 mg groups, respectively. The most common AE's (>5% in any treatment group) were asthma, nasopharyngitis, upper respiratory tract infection, headache, and injection site reaction (ISR). The most common drug-related AE was ISR, which was reported more frequently in the astegolimab treatment groups than in the placebo group (1 [0.8%] patient in the placebo group, 10 [7.9%] patients with 70 mg, 8 [6.3%] patients with 210 mg, 6 [4.9%] patients with 490 mg). All ISRs were non-serious and mild or moderate in severity. During the treatment period, 50 SAEs were reported in 37 (7.4%) patients. The number of patients reporting SAEs was comparable across all cohorts (11 SAEs in 8 patients on placebo, 21 SAEs in 14 patients on 70 mg, 11 SAEs in 9 patients on 210 mg, and 7 SAEs in 6 patients on 490 mg). The most common SAE was asthma. One SAE of moderate livedo reticularis (70 mg) was considered a suspected unexpected serious adverse drug reaction (SUSAR) related to astegolimab and was reported two days after the second dose, leading to discontinuation of astegolimab. Two (0.4%) patients reported anaphylaxis and hypersensitivity reactions: 1 severe SAE of anaphylactic reaction (placebo), and 1 moderate hypersensitivity (490 mg) considered related to astegolimab. Three (0.6%) patients experienced a potential Major Adverse Cardiac Event (MACE) (1 patient each in the placebo, 70 mg, and 210 mg groups). None of the potential MACE were considered related to astegolimab. Overall, 233 (46.4%)  patients reported events of infection. A comparable number of patients reported infection across all treatment groups (65 [51.2%] patients on placebo, 55 [43.3%] patients on 70 mg, 58 [46.0%] patients on 210 mg, and 55 [45.1%] patients on 490 mg). The most frequently reported infection (?10% incidence) was nasopharyngitis (12.7%). One patient on astegolimab 210 mg and the partner of one patient on placebo became pregnant during the study. Both delivered normal/healthy babies. Two deaths, unrelated to study drug, were reported: one patient on 210 mg astegolimab died following an SAE of asthma; the other patient on 490 mg astegolimab had an unexplained death. There were no clinically meaningful changes in laboratory parameters, vital signs, or ECG results, other than the 10% decrease in mean blood eosinophil counts in the astegolimab-treated groups, with no safety concerns. Treatment-induced ADAs were comparable between the astegolimab groups and had no impact on safety. Overall, astegolimab was well tolerated at all doses used and had a safety profile consistent with that observed in the previous astegolimab Phase I studies.  Serious Adverse Reactions Observed in Previous COPD Study: In the completed IIS Study [id number], a total of 81 patients received at least one dose of astegolimab or placebo. The safety profile of astegolimab was similar to that of placebo. There were a total of 222 AEs reported in 62 patients. A total of 28 (72%) patients in the placebo arm and 34 (81%) patients in the astegolimab arm reported at least one AE. The most commonly reported AEs were headache followed by urinary tract infection and viral upper respiratory tract infection. A total of 39 SAEs were reported in 28 (35%) patients; 16 (41%) patients in the placebo group and 12 (29%) in the astegolimab group. The most commonly reported SAE was hospital admission for community acquired pneumonia. Four SAEs resulted in patient  discontinuation from treatment (  1 patient in the placebo group and 3 patients in the astegolimab group). One patient in the placebo group experienced an AESI of potential MACE (heart failure, unrelated to the study treatment). No anaphylaxis or pregnancies were reported. Two deaths, unrelated to study treatment, were reported: 1 patient in the placebo group died after hospital acquired pneumonia, and another patient in the placebo group died after pneumonia and type 2 respiratory failure.  Safety Data: Astegolimab has been generally well tolerated. There have been 49 patient deaths across all astegolimab studies (Section 5.5.2), none of which were considered related to astegolimab. A total of 144 subjects experienced a total of 224 SAEs across all astegolimab studies. Of these, an SAE livedo reticularis observed in 1 patient was considered related to astegolimab by the investigator (Section 5.5.3). AEs leading to withdrawal (Section 5.5.4) have generally occurred at a rate similar to what would be expected for clinical trials in the studies' respective indications.   [facility] [place] Clinical Research Coordinator note:   This visit for Subject Katie Greene with DOB: July 14, 1956 on 12/31/2022 for the above protocol is Visit19/ Week 34  and is for purpose of research.    Subject expressed continued interest and consent in continuing as a study subject. Subject confirmed that there was  no  change in contact information (e.g. address, telephone, email). Subject thanked for participation in research and contribution to science. IThe Subject was  informed that the PI Murali Ramaswamy,MD  continues to have oversight of the subject's visits and course through relevant discussions, reviews, and also specifically of this visit by routing of this note to the PI.  All procedures and assessments were completed per above protocol.  Subject tolerated injection well.  Refer to the subjects paper source binder for  further details of this visit.  Signed by  Laurelyn Sickle  Clinical Research Coordinator  [facility]  [place], [place] 12:52 PM 12/31/2022

## 2023-01-04 DIAGNOSIS — H35033 Hypertensive retinopathy, bilateral: Secondary | ICD-10-CM | POA: Diagnosis not present

## 2023-01-04 DIAGNOSIS — H2513 Age-related nuclear cataract, bilateral: Secondary | ICD-10-CM | POA: Diagnosis not present

## 2023-01-14 ENCOUNTER — Encounter: Payer: Medicare HMO | Admitting: *Deleted

## 2023-01-14 DIAGNOSIS — Z006 Encounter for examination for normal comparison and control in clinical research program: Secondary | ICD-10-CM

## 2023-01-14 DIAGNOSIS — J441 Chronic obstructive pulmonary disease with (acute) exacerbation: Secondary | ICD-10-CM

## 2023-01-14 MED ORDER — STUDY - ARNASA GB44332 - ASTEGOLIMAB 238 MG/1.7 ML OR PLACEBO SQ INJECTION (PI-RAMASWAMY)
476.0000 mg | INJECTION | SUBCUTANEOUS | Status: DC
  Administered 2023-01-14: 476 mg via SUBCUTANEOUS
  Filled 2023-01-14: qty 3.4

## 2023-01-14 NOTE — Research (Signed)
Title: A Phase III, randomized, double-blind, placebo controlled, multicenter study to evaluate the efficacy and safety of astegolimab in patients with chronic obstructive pulmonary disease    Dose and Duration of Treatment: Astegolimab is presented as a sterile, slightly brown-yellow solution. Each single-use, 2.25 mL pre-filled syringe contains 1.7 mL deliverable volume. Astegolimab drug product is formulated at 140 mg/mL astegolimab with 114 mM succinic acid, 200 mM L-arginine, 10 mM L-methionine, 0.06% (w/v) polysorbate 20, pH 5.7. Placebo for astegolimab is supplied in an identical pre-filled syringe configuration.   Protocol # [id number]   Sponsor: [employer] [address] [place]  Protocol: Version 3, dated 26/May2023, Version 4 dated 20Jun2023 IB: version 9.0 dated April 2024 ICF: Main version 12Apr2023, revised 17July2023 Mobile Nursing 01Sep2023, revised 01Sep2023 Lab Manual: V4.0.0 20Oct2023 Mechanism of action: Astegolimab (also known as [id number] or [id number]) is a fully human, IgG2 monoclonal antibody that binds with high affinity to the interleukin (IL)-33 (IL-33) receptor, ST2, thereby blocking the signaling of IL-33, an inflammatory cytokine of the IL-1 family and member of the "alarmin" class of molecules. Astegolimab binds with high affinity to the human and cynomolgus monkey receptor for IL-33, ST2, and blocks IL-33 binding, thus inhibiting association with the IL-1R accessory protein (AcP) co-receptor and formation of an activated receptor complex.    Integrated Pharmacokinetic/Pharmacodynamic Analysis:  In Studies [id number], [id number], and [id number], exploratory biomarker analysis showed there were consistent decreases in blood eosinophil counts throughout the treatment period, potentially mediated by a direct effect of IL-33 on eosinophil progenitors. In [place], there was no significant difference in fractional exhaled nitric oxide (FeNO)  levels (reflecting airway IL-4/IL-13 activity) between astegolimab-treated groups relative to placebo throughout the treatment period. These data suggest that astegolimab has only a limited effect on Type 2 inflammation in asthma. No data are applicable for Study [id number]. No data are available yet for Study [id number].  Special Warnings/Considerations: Administration of astegolimab, a protein therapeutic, may lead to the development of anti-astegolimab antibodies which could lead to AEs and/or decreased exposure. In non-clinical studies (see Section 4.2), ADA incidence has generally been low and there was no apparent ADA impact on PK and safety in these studies. To date, the immunogenicity rates observed with astegolimab in clinical studies have been relatively low as well (see Immunogenicity Section 5.6). Several clinical studies have been conducted in patients with asthma, atopic dermatitis, COPD (IIS Study [id number]) and COVID-19 severe pneumonia, which generally showed low incidence of ADAs (Table 31). There has been no correlation between ADA status and clinical findings or increased incidence of AEs. Astegolimab is now being considered in a larger study for the treatment of COPD. This patient population is typically considered to have a hyper-responsive immune system. Route of administration for this molecule will be [place], either Q2W or Q4W. These factors increase the risk of development of an immune response to astegolimab, specifically with repeat dosing. From the previous IIS Study [id number], incidence of ADAs was low in COPD patients; this remains to be confirmed in a larger study. To monitor ADA development in ongoing studies, serum samples will be collected from patients at protocol-defined intervals. Patients who test positive for antibodies and have clinical sequelae that are considered potentially related to an ADA response may also be asked to return for additional follow-up testing.  Drug Interaction  Studies: No PK drug interaction studies have been conducted to date.  Serious Adverse Reactions Observed in Asthma Study: During the treatment period, a similar proportion of patients  across all cohorts experienced at least one AE, regardless of causality (Table 22). In total, 77.2%, 70.9%, 72.2%, and 72.1% of patients reported at least 1 AE in the placebo, 70 mg, 210 mg, and 490 mg groups, respectively. The most common AE's (>5% in any treatment group) were asthma, nasopharyngitis, upper respiratory tract infection, headache, and injection site reaction (ISR). The most common drug-related AE was ISR, which was reported more frequently in the astegolimab treatment groups than in the placebo group (1 [0.8%] patient in the placebo group, 10 [7.9%] patients with 70 mg, 8 [6.3%] patients with 210 mg, 6 [4.9%] patients with 490 mg). All ISRs were non-serious and mild or moderate in severity. During the treatment period, 50 SAEs were reported in 37 (7.4%) patients. The number of patients reporting SAEs was comparable across all cohorts (11 SAEs in 8 patients on placebo, 21 SAEs in 14 patients on 70 mg, 11 SAEs in 9 patients on 210 mg, and 7 SAEs in 6 patients on 490 mg). The most common SAE was asthma. One SAE of moderate livedo reticularis (70 mg) was considered a suspected unexpected serious adverse drug reaction (SUSAR) related to astegolimab and was reported two days after the second dose, leading to discontinuation of astegolimab. Two (0.4%) patients reported anaphylaxis and hypersensitivity reactions: 1 severe SAE of anaphylactic reaction (placebo), and 1 moderate hypersensitivity (490 mg) considered related to astegolimab. Three (0.6%) patients experienced a potential Major Adverse Cardiac Event (MACE) (1 patient each in the placebo, 70 mg, and 210 mg groups). None of the potential MACE were considered related to astegolimab. Overall, 233 (46.4%) patients reported events of infection. A comparable number of  patients reported infection across all treatment groups (65 [51.2%] patients on placebo, 55 [43.3%] patients on 70 mg, 58 [46.0%] patients on 210 mg, and 55 [45.1%] patients on 490 mg). The most frequently reported infection (?10% incidence) was nasopharyngitis (12.7%). One patient on astegolimab 210 mg and the partner of one patient on placebo became pregnant during the study. Both delivered normal/healthy babies. Two deaths, unrelated to study drug, were reported: one patient on 210 mg astegolimab died following an SAE of asthma; the other patient on 490 mg astegolimab had an unexplained death. There were no clinically meaningful changes in laboratory parameters, vital signs, or ECG results, other than the 10% decrease in mean blood eosinophil counts in the astegolimab-treated groups, with no safety concerns. Treatment-induced ADAs were comparable between the astegolimab groups and had no impact on safety. Overall, astegolimab was well tolerated at all doses used and had a safety profile consistent with that observed in the previous astegolimab Phase I studies.  Serious Adverse Reactions Observed in Previous COPD Study: In the completed IIS Study [id number], a total of 81 patients received at least one dose of astegolimab or placebo. The safety profile of astegolimab was similar to that of placebo. There were a total of 222 AEs reported in 62 patients. A total of 28 (72%) patients in the placebo arm and 34 (81%) patients in the astegolimab arm reported at least one AE. The most commonly reported AEs were headache followed by urinary tract infection and viral upper respiratory tract infection. A total of 39 SAEs were reported in 28 (35%) patients; 16 (41%) patients in the placebo group and 12 (29%) in the astegolimab group. The most commonly reported SAE was hospital admission for community acquired pneumonia. Four SAEs resulted in patient discontinuation from treatment (1 patient in the placebo group and 3  patients in the  astegolimab group). One patient in the placebo group experienced an AESI of potential MACE (heart failure, unrelated to the study treatment). No anaphylaxis or pregnancies were reported. Two deaths, unrelated to study treatment, were reported: 1 patient in the placebo group died after hospital acquired pneumonia, and another patient in the placebo group died after pneumonia and type 2 respiratory failure.  Safety Data: Astegolimab has been generally well tolerated. There have been 49 patient deaths across all astegolimab studies (Section 5.5.2), none of which were considered related to astegolimab. A total of 144 subjects experienced a total of 224 SAEs across all astegolimab studies. Of these, an SAE livedo reticularis observed in 1 patient was considered related to astegolimab by the investigator (Section 5.5.3). AEs leading to withdrawal (Section 5.5.4) have generally occurred at a rate similar to what would be expected for clinical trials in the studies' respective indications.   [facility] [place] Clinical Research Coordinator note :        Subject expressed continued interest and consent in continuing as a study subject. Subject confirmed that there was no change in contact information (e.g. address, telephone, email). Subject thanked for participation in research and contribution to science.  In this Visit 20/Week# 36 the subject will be evaluated by Oren Bracket. This research coordinator has verified that the above investigator is up to date with his/her training logs.    The Subject was informed that the PI Dr. Marchelle Gearing continues to have oversight of the subject's visits and course  through relevant discussions, reviews and also specifically of this visit by routing of this note to the PI.   [facility] [place] Clinical Research Coordinator note:   This visit for Subject Katie Greene with DOB: 01-Sep-1955 on 01/14/2023 for the above protocol is Visit # 20  and is  for purpose of research.   Subject expressed continued interest and consent in continuing as a study subject. Subject confirmed that there was  no  change in contact information (e.g. address, telephone, email). Subject thanked for participation in research and contribution to science. In this visit 01/14/2023 the subject will be evaluated by Oren Bracket Sub-Investigator . This research coordinator has verified that the above investigator is yes, up to date with his/her training logs.   The Subject was yes,  informed that the PI Kalman Shan, MD  continues to have oversight of the subject's visits and course through relevant discussions, reviews, and also specifically of this visit by routing of this note to the PI.  All procedures and assessments were completed per above stated protocol. Subject tolerated injections well. Refer to subjects's paper source binder for further details of this visit.     Signed by  Laurelyn Sickle  Clinical Research Coordinator  [facility]  [place], [place] 1:38 PM 01/14/2023

## 2023-01-14 NOTE — Progress Notes (Signed)
[place] [facility] Raul Del , DOB Apr 27, 1956,was seen as subject in a clinical trial /Protocol # [id number] Cardiopulmonary symptoms are stable with slight exacerbation of symptoms with allergen exposure or with temperature or humidity changes. Other or new symptoms denied. Pertinent physical findings include: Intermittent splitting of S1; accentuated S2; insignificant isolated rales @ R lung base & minor scattered expiratory wheezes; 1+ edema RLE & 1/2+ edema LLE. All physical findings NCS                                                                     Pecola Lawless MD,SI

## 2023-01-19 NOTE — Patient Instructions (Signed)
ICD-10-CM   1. Research subject  Z00.6 DG Chest 2 View    2. Chronic obstructive pulmonary disease, unspecified COPD type (HCC)  J44.9 DG Chest 2 View    3. COPD, frequent exacerbations (HCC)  J44.1

## 2023-01-19 NOTE — Patient Instructions (Signed)
ICD-10-CM   1. Patient in clinical research study  Z00.6     2. COPD, frequent exacerbations (HCC)  J44.1       Per protocol

## 2023-01-19 NOTE — Progress Notes (Signed)
Title: A Phase III, randomized, double-blind, placebo controlled, multicenter study to evaluate the efficacy and safety of astegolimab in patients with chronic obstructive pulmonary disease    Dose and Duration of Treatment: Astegolimab is presented as a sterile, slightly brown-yellow solution. Each single-use, 2.25 mL pre-filled syringe contains 1.7 mL deliverable volume. Astegolimab drug product is formulated at 140 mg/mL astegolimab with 114 mM succinic acid, 200 mM L-arginine, 10 mM L-methionine, 0.06% (w/v) polysorbate 20, pH 5.7. Placebo for astegolimab is supplied in an identical pre-filled syringe configuration.   Protocol # [id number]   Sponsor: [employer] [address], [place]    Personnel officer: Astegolimab [phone])   Mechanism of action: Astegolimab (also known as [id number] or [id number]) is a fully human, IgG2 monoclonal antibody that binds with high affinity to the interleukin (IL)-33 (IL-33) receptor, ST2, thereby blocking the signaling of IL-33, an inflammatory cytokine of the IL-1 family and member of the "alarmin" class of molecules. Astegolimab binds with high affinity to the human and cynomolgus monkey receptor for IL-33, ST2, and blocks IL-33 binding, thus inhibiting association with the IL-1R accessory protein (AcP) co-receptor and formation of an activated receptor complex.  xxxxxxxxxxxxxxxxxxxxxx  This visit for Subject Katie Greene with DOB: 04/28/1956 on 01/19/2023 for the above protocol is Visit/Encounter # Visit/Encounter # 14 Week 24  and is for purpose of research.  Subject/LAR expressed continued interest and consent in continuing as a study subject. Subject thanked for participation in research and contribution to science.    S: No new complaints  Exam in paper source and is baeline  A Research subject Copd freq exacerbation  Plan  - proceed per protocol    SIGNATURE    Dr. Kalman Shan, M.D.,  F.C.C.P, ACRP-CPI Pulmonary and Critical Care Medicine Research Investigator, [facility] @ [facility] Health Staff Physician, [facility] Center Director - Interstitial Lung Disease  Program  Pulmonary Fibrosis [facility] - [facility] and [facility] @ [facility] [place], [place], [address]   Pager: [phone], If no answer  OR between  19:00-7:00h: page [phone] Telephone (research): [phone]  6:48 PM 01/19/2023   6:48 PM 01/19/2023

## 2023-01-19 NOTE — Progress Notes (Signed)
Title: A Phase III, randomized, double-blind, placebo controlled, multicenter study to evaluate the efficacy and safety of astegolimab in patients with chronic obstructive pulmonary disease    Dose and Duration of Treatment: Astegolimab is presented as a sterile, slightly brown-yellow solution. Each single-use, 2.25 mL pre-filled syringe contains 1.7 mL deliverable volume. Astegolimab drug product is formulated at 140 mg/mL astegolimab with 114 mM succinic acid, 200 mM L-arginine, 10 mM L-methionine, 0.06% (w/v) polysorbate 20, pH 5.7. Placebo for astegolimab is supplied in an identical pre-filled syringe configuration.   Protocol # [id number]   Sponsor: [employer] [address], [place]  Personnel officer: Astegolimab [phone])   Mechanism of action: Astegolimab (also known as [id number] or [id number]) is a fully human, IgG2 monoclonal antibody that binds with high affinity to the interleukin (IL)-33 (IL-33) receptor, ST2, thereby blocking the signaling of IL-33, an inflammatory cytokine of the IL-1 family and member of the "alarmin" class of molecules. Astegolimab binds with high affinity to the human and cynomolgus monkey receptor for IL-33, ST2, and blocks IL-33 binding, thus inhibiting association with the IL-1R accessory protein (AcP) co-receptor and formation of an activated receptor complex.  xxxxxxxxxxxxxxxxxxxxxxxxxxxxx    This visit for Subject Katie Greene with DOB: January 01, 1956 on 01/19/2023 for the above protocol is Visit/Encounter # This visit is a key visit of screening   and is for purpose of researhc . Subject/LAR e. Subject thanked for participation in research and contribution to science. Consent done   S: screening visit  Objec Exam in paper source  A Research subject COPD frequet exacerbation  Plan  Per protocol    SIGNATURE - LATE ENTRY    Dr. Kalman Shan, M.D., F.C.C.P,  Pulmonary and Critical Care  Medicine Staff Physician, [facility] Center Director - Interstitial Lung Disease  Program  Pulmonary Fibrosis [facility] at [facility] [place], [place], [address]   Pager: [phone], If no answer  -> Check AMION or Try [phone] Telephone (clinical office): [phone] Telephone (research): [phone]  6:53 PM 01/19/2023

## 2023-01-28 ENCOUNTER — Encounter: Payer: Medicare HMO | Admitting: *Deleted

## 2023-01-28 DIAGNOSIS — J441 Chronic obstructive pulmonary disease with (acute) exacerbation: Secondary | ICD-10-CM

## 2023-01-28 DIAGNOSIS — Z006 Encounter for examination for normal comparison and control in clinical research program: Secondary | ICD-10-CM

## 2023-01-28 MED ORDER — STUDY - ARNASA GB44332 - ASTEGOLIMAB 238 MG/1.7 ML OR PLACEBO SQ INJECTION (PI-RAMASWAMY)
476.0000 mg | INJECTION | SUBCUTANEOUS | Status: DC
  Administered 2023-01-28: 476 mg via SUBCUTANEOUS
  Filled 2023-01-28: qty 3.4

## 2023-01-28 NOTE — Research (Signed)
Title: A Phase III, randomized, double-blind, placebo controlled, multicenter study to evaluate the efficacy and safety of astegolimab in patients with chronic obstructive pulmonary disease    Dose and Duration of Treatment: Astegolimab is presented as a sterile, slightly brown-yellow solution. Each single-use, 2.25 mL pre-filled syringe contains 1.7 mL deliverable volume. Astegolimab drug product is formulated at 140 mg/mL astegolimab with 114 mM succinic acid, 200 mM L-arginine, 10 mM L-methionine, 0.06% (w/v) polysorbate 20, pH 5.7. Placebo for astegolimab is supplied in an identical pre-filled syringe configuration.   Protocol # [id number]  Protocol: Version 3, dated 26/May2023, Version 4 dated 20Jun2023 IB: version 9.0 dated April 2024 ICF: Main version 12Apr2023, revised 17July2023 Mobile Nursing 01Sep2023, revised 01Sep2023 Lab Manual: V4.0.0 20Oct2023  Mechanism of action: Astegolimab (also known as [id number] or [id number]) is a fully human, IgG2 monoclonal antibody that binds with high affinity to the interleukin (IL)-33 (IL-33) receptor, ST2, thereby blocking the signaling of IL-33, an inflammatory cytokine of the IL-1 family and member of the "alarmin" class of molecules. Astegolimab binds with high affinity to the human and cynomolgus monkey receptor for IL-33, ST2, and blocks IL-33 binding, thus inhibiting association with the IL-1R accessory protein (AcP) co-receptor and formation of an activated receptor complex.   Special Warnings/Considerations: Administration of astegolimab, a protein therapeutic, may lead to the development of anti-astegolimab antibodies which could lead to AEs and/or decreased exposure. In non-clinical studies (see Section 4.2), ADA incidence has generally been low and there was no apparent ADA impact on PK and safety in these studies. To date, the immunogenicity rates observed with astegolimab in clinical studies have been relatively low as well (see Immunogenicity  Section 5.6). Several clinical studies have been conducted in patients with asthma, atopic dermatitis, COPD (IIS Study [id number]) and COVID-19 severe pneumonia, which generally showed low incidence of ADAs (Table 31). There has been no correlation between ADA status and clinical findings or increased incidence of AEs. Astegolimab is now being considered in a larger study for the treatment of COPD. This patient population is typically considered to have a hyper-responsive immune system. Route of administration for this molecule will be [place], either Q2W or Q4W. These factors increase the risk of development of an immune response to astegolimab, specifically with repeat dosing. From the previous IIS Study [id number], incidence of ADAs was low in COPD patients; this remains to be confirmed in a larger study. To monitor ADA development in ongoing studies, serum samples will be collected from patients at protocol-defined intervals. Patients who test positive for antibodies and have clinical sequelae that are considered potentially related to an ADA response may also be asked to return for additional follow-up testing.  Drug Interaction Studies: No PK drug interaction studies have been conducted to date.  Serious Adverse Reactions Observed in Asthma Study: During the treatment period, a similar proportion of patients across all cohorts experienced at least one AE, regardless of causality (Table 22). In total, 77.2%, 70.9%, 72.2%, and 72.1% of patients reported at least 1 AE in the placebo, 70 mg, 210 mg, and 490 mg groups, respectively. The most common AE's (>5% in any treatment group) were asthma, nasopharyngitis, upper respiratory tract infection, headache, and injection site reaction (ISR). The most common drug-related AE was ISR, which was reported more frequently in the astegolimab treatment groups than in the placebo group (1 [0.8%] patient in the placebo group, 10 [7.9%] patients with 70 mg, 8 [6.3%] patients with 210  mg, 6 [4.9%] patients with 490 mg). All ISRs were  non-serious and mild or moderate in severity. During the treatment period, 50 SAEs were reported in 37 (7.4%) patients. The number of patients reporting SAEs was comparable across all cohorts (11 SAEs in 8 patients on placebo, 21 SAEs in 14 patients on 70 mg, 11 SAEs in 9 patients on 210 mg, and 7 SAEs in 6 patients on 490 mg). The most common SAE was asthma. One SAE of moderate livedo reticularis (70 mg) was considered a suspected unexpected serious adverse drug reaction (SUSAR) related to astegolimab and was reported two days after the second dose, leading to discontinuation of astegolimab. Two (0.4%) patients reported anaphylaxis and hypersensitivity reactions: 1 severe SAE of anaphylactic reaction (placebo), and 1 moderate hypersensitivity (490 mg) considered related to astegolimab. Three (0.6%) patients experienced a potential Major Adverse Cardiac Event (MACE) (1 patient each in the placebo, 70 mg, and 210 mg groups). None of the potential MACE were considered related to astegolimab. Overall, 233 (46.4%) patients reported events of infection. A comparable number of patients reported infection across all treatment groups (65 [51.2%] patients on placebo, 55 [43.3%] patients on 70 mg, 58 [46.0%] patients on 210 mg, and 55 [45.1%] patients on 490 mg). The most frequently reported infection (?10% incidence) was nasopharyngitis (12.7%). One patient on astegolimab 210 mg and the partner of one patient on placebo became pregnant during the study. Both delivered normal/healthy babies. Two deaths, unrelated to study drug, were reported: one patient on 210 mg astegolimab died following an SAE of asthma; the other patient on 490 mg astegolimab had an unexplained death. There were no clinically meaningful changes in laboratory parameters, vital signs, or ECG results, other than the 10% decrease in mean blood eosinophil counts in the astegolimab-treated groups, with no  safety concerns. Treatment-induced ADAs were comparable between the astegolimab groups and had no impact on safety. Overall, astegolimab was well tolerated at all doses used and had a safety profile consistent with that observed in the previous astegolimab Phase I studies.  Serious Adverse Reactions Observed in Previous COPD Study: In the completed IIS Study [id number], a total of 81 patients received at least one dose of astegolimab or placebo. The safety profile of astegolimab was similar to that of placebo. There were a total of 222 AEs reported in 62 patients. A total of 28 (72%) patients in the placebo arm and 34 (81%) patients in the astegolimab arm reported at least one AE. The most commonly reported AEs were headache followed by urinary tract infection and viral upper respiratory tract infection. A total of 39 SAEs were reported in 28 (35%) patients; 16 (41%) patients in the placebo group and 12 (29%) in the astegolimab group. The most commonly reported SAE was hospital admission for community acquired pneumonia. Four SAEs resulted in patient discontinuation from treatment (1 patient in the placebo group and 3 patients in the astegolimab group). One patient in the placebo group experienced an AESI of potential MACE (heart failure, unrelated to the study treatment). No anaphylaxis or pregnancies were reported. Two deaths, unrelated to study treatment, were reported: 1 patient in the placebo group died after hospital acquired pneumonia, and another patient in the placebo group died after pneumonia and type 2 respiratory failure.  Safety Data: Astegolimab has been generally well tolerated. There have been 49 patient deaths across all astegolimab studies (Section 5.5.2), none of which were considered related to astegolimab. A total of 144 subjects experienced a total of 224 SAEs across all astegolimab studies. Of these, an SAE livedo reticularis observed  in 1 patient was considered related to astegolimab by the  investigator (Section 5.5.3). AEs leading to withdrawal (Section 5.5.4) have generally occurred at a rate similar to what would be expected for clinical trials in the studies' respective indications.   [facility] [place] Clinical Research Coordinator note:   This visit for Subject Katie Greene with DOB: 1956-02-22 on 01/28/2023 for the above protocol is Visit# 21  and is for purpose of research.  Subject expressed continued interest and consent in continuing as a study subject. Subject confirmed that there was  no,  change in contact information (e.g. address, telephone, email). Subject thanked for participation in research and contribution to science. The Subject was yes, informed that the PI Kalman Shan, MD continues to have oversight of the subject's visits and course through relevant discussions, reviews, and also specifically of this visit by routing of this note to the PI.  All procedure and assessments were completed per above stated protocol. Subject tolerated injections well. Refer to subjects paper source binder for further details of this visit.  Signed by  Jerolyn Shin [facility]  Clinical Research Coordinator  [facility]  [place], [place] 11:54 AM 01/28/2023

## 2023-02-11 ENCOUNTER — Encounter: Payer: Medicare HMO | Admitting: *Deleted

## 2023-02-11 DIAGNOSIS — Z006 Encounter for examination for normal comparison and control in clinical research program: Secondary | ICD-10-CM

## 2023-02-11 DIAGNOSIS — J441 Chronic obstructive pulmonary disease with (acute) exacerbation: Secondary | ICD-10-CM

## 2023-02-11 MED ORDER — STUDY - ARNASA GB44332 - ASTEGOLIMAB 238 MG/1.7 ML OR PLACEBO SQ INJECTION (PI-RAMASWAMY)
476.0000 mg | INJECTION | SUBCUTANEOUS | Status: DC
  Administered 2023-02-11: 476 mg via SUBCUTANEOUS
  Filled 2023-02-11: qty 3.4

## 2023-02-11 NOTE — Research (Cosign Needed)
Title: A Phase III, randomized, double-blind, placebo controlled, multicenter study to evaluate the efficacy and safety of astegolimab in patients with chronic obstructive pulmonary disease    Dose and Duration of Treatment: Astegolimab is presented as a sterile, slightly brown-yellow solution. Each single-use, 2.25 mL pre-filled syringe contains 1.7 mL deliverable volume. Astegolimab drug product is formulated at 140 mg/mL astegolimab with 114 mM succinic acid, 200 mM L-arginine, 10 mM L-methionine, 0.06% (w/v) polysorbate 20, pH 5.7. Placebo for astegolimab is supplied in an identical pre-filled syringe configuration.   Protocol # [id number] Protocol: Version 3, dated 26/May2023, Version 4 dated 20Jun2023 IB: version 9.0 dated April 2024 ICF: Main version 12Apr2023, revised 17July2023 Mobile Nursing 01Sep2023, revised 01Sep2023 Lab Manual: V4.0.0 20Oct2023    Mechanism of action: Astegolimab (also known as [id number] or [id number]) is a fully human, IgG2 monoclonal antibody that binds with high affinity to the interleukin (IL)-33 (IL-33) receptor, ST2, thereby blocking the signaling of IL-33, an inflammatory cytokine of the IL-1 family and member of the "alarmin" class of molecules. Astegolimab binds with high affinity to the human and cynomolgus monkey receptor for IL-33, ST2, and blocks IL-33 binding, thus inhibiting association with the IL-1R accessory protein (AcP) co-receptor and formation of an activated receptor complex.   Key Inclusion Criteria: Age 61-80 years at Visit 1   Documented COPD diagnosis for ?12 months prior to Visit 1 History of frequent exacerbations, defined as having had 2 or more moderate or severe COPD exacerbations within 12 months prior to Visit 1 Post-bronchodilator FEV1 ?20% and <80% of predicted at Visit 1 or Visit 2  Post-bronchodilator FEV1/FVC <0.70 at Visit 1 or Visit 2  mMRC score ?2 at screening   Current tobacco smoker or former smoker (having stopped smoking  for at least 6 months prior to Visit 1) with a history of smoking ?10 pack-years On optimized COPD maintenance therapy as defined below for ?12 months prior to Visit 1            -Inhaled corticosteroid (ICS) plus long-acting beta-agonist (LABA)           -Long-acting muscarinic antagonist (LAMA) plus LABA           -ICS plus LAMA plus LABA   Special Warnings/Considerations: Administration of astegolimab, a protein therapeutic, may lead to the development of anti-astegolimab antibodies which could lead to AEs and/or decreased exposure. In non-clinical studies (see Section 4.2), ADA incidence has generally been low and there was no apparent ADA impact on PK and safety in these studies. To date, the immunogenicity rates observed with astegolimab in clinical studies have been relatively low as well (see Immunogenicity Section 5.6). Several clinical studies have been conducted in patients with asthma, atopic dermatitis, COPD (IIS Study [id number]) and COVID-19 severe pneumonia, which generally showed low incidence of ADAs (Table 31). There has been no correlation between ADA status and clinical findings or increased incidence of AEs. Astegolimab is now being considered in a larger study for the treatment of COPD. This patient population is typically considered to have a hyper-responsive immune system. Route of administration for this molecule will be [place], either Q2W or Q4W. These factors increase the risk of development of an immune response to astegolimab, specifically with repeat dosing. From the previous IIS Study [id number], incidence of ADAs was low in COPD patients; this remains to be confirmed in a larger study. To monitor ADA development in ongoing studies, serum samples will be collected from patients at protocol-defined intervals. Patients who test positive for  antibodies and have clinical sequelae that are considered potentially related to an ADA response may also be asked to return for additional follow-up  testing.  Drug Interaction Studies: No PK drug interaction studies have been conducted to date.  Serious Adverse Reactions Observed in Asthma Study: During the treatment period, a similar proportion of patients across all cohorts experienced at least one AE, regardless of causality (Table 22). In total, 77.2%, 70.9%, 72.2%, and 72.1% of patients reported at least 1 AE in the placebo, 70 mg, 210 mg, and 490 mg groups, respectively. The most common AE's (>5% in any treatment group) were asthma, nasopharyngitis, upper respiratory tract infection, headache, and injection site reaction (ISR). The most common drug-related AE was ISR, which was reported more frequently in the astegolimab treatment groups than in the placebo group (1 [0.8%] patient in the placebo group, 10 [7.9%] patients with 70 mg, 8 [6.3%] patients with 210 mg, 6 [4.9%] patients with 490 mg). All ISRs were non-serious and mild or moderate in severity. During the treatment period, 50 SAEs were reported in 37 (7.4%) patients. The number of patients reporting SAEs was comparable across all cohorts (11 SAEs in 8 patients on placebo, 21 SAEs in 14 patients on 70 mg, 11 SAEs in 9 patients on 210 mg, and 7 SAEs in 6 patients on 490 mg). The most common SAE was asthma. One SAE of moderate livedo reticularis (70 mg) was considered a suspected unexpected serious adverse drug reaction (SUSAR) related to astegolimab and was reported two days after the second dose, leading to discontinuation of astegolimab. Two (0.4%) patients reported anaphylaxis and hypersensitivity reactions: 1 severe SAE of anaphylactic reaction (placebo), and 1 moderate hypersensitivity (490 mg) considered related to astegolimab. Three (0.6%) patients experienced a potential Major Adverse Cardiac Event (MACE) (1 patient each in the placebo, 70 mg, and 210 mg groups). None of the potential MACE were considered related to astegolimab. Overall, 233 (46.4%) patients reported events of infection.  A comparable number of patients reported infection across all treatment groups (65 [51.2%] patients on placebo, 55 [43.3%] patients on 70 mg, 58 [46.0%] patients on 210 mg, and 55 [45.1%] patients on 490 mg). The most frequently reported infection (?10% incidence) was nasopharyngitis (12.7%). One patient on astegolimab 210 mg and the partner of one patient on placebo became pregnant during the study. Both delivered normal/healthy babies. Two deaths, unrelated to study drug, were reported: one patient on 210 mg astegolimab died following an SAE of asthma; the other patient on 490 mg astegolimab had an unexplained death. There were no clinically meaningful changes in laboratory parameters, vital signs, or ECG results, other than the 10% decrease in mean blood eosinophil counts in the astegolimab-treated groups, with no safety concerns. Treatment-induced ADAs were comparable between the astegolimab groups and had no impact on safety. Overall, astegolimab was well tolerated at all doses used and had a safety profile consistent with that observed in the previous astegolimab Phase I studies.  Serious Adverse Reactions Observed in Previous COPD Study: In the completed IIS Study [id number], a total of 81 patients received at least one dose of astegolimab or placebo. The safety profile of astegolimab was similar to that of placebo. There were a total of 222 AEs reported in 62 patients. A total of 28 (72%) patients in the placebo arm and 34 (81%) patients in the astegolimab arm reported at least one AE. The most commonly reported AEs were headache followed by urinary tract infection and viral upper respiratory tract infection. A  total of 39 SAEs were reported in 28 (35%) patients; 16 (41%) patients in the placebo group and 12 (29%) in the astegolimab group. The most commonly reported SAE was hospital admission for community acquired pneumonia. Four SAEs resulted in patient discontinuation from treatment (1 patient in the  placebo group and 3 patients in the astegolimab group). One patient in the placebo group experienced an AESI of potential MACE (heart failure, unrelated to the study treatment). No anaphylaxis or pregnancies were reported. Two deaths, unrelated to study treatment, were reported: 1 patient in the placebo group died after hospital acquired pneumonia, and another patient in the placebo group died after pneumonia and type 2 respiratory failure.  Safety Data: Astegolimab has been generally well tolerated. There have been 49 patient deaths across all astegolimab studies (Section 5.5.2), none of which were considered related to astegolimab. A total of 144 subjects experienced a total of 224 SAEs across all astegolimab studies. Of these, an SAE livedo reticularis observed in 1 patient was considered related to astegolimab by the investigator (Section 5.5.3). AEs leading to withdrawal (Section 5.5.4) have generally occurred at a rate similar to what would be expected for clinical trials in the studies' respective indications. [facility] [place] Clinical Research Coordinator note:   This visit for Subject AZIE MCCONAHY with DOB: 1955/10/27 on 02/11/2023 for the above protocol is Visit/Encounter # 22  and is for purpose of research.   Subject expressed continued interest and consent in continuing as a study subject. Subject confirmed that there was  no  change in contact information (e.g. address, telephone, email). Subject thanked for participation in research and contribution to science. The Subject was yes informed that the PI Murali Ramaswamy,MD continues to have oversight of the subject's visits and course through relevant discussions, reviews, and also specifically of this visit by routing of this note to the PI.  All assessments and procedures were completed per above stated protocol. Subject tolerated injections well. Please refer to subjects paper source binder for further details of this  visit.    Signed by  Jerolyn Shin, [facility]  Clinical Research Coordinator  [facility]  [place], [place] 11:36 AM 02/11/2023

## 2023-02-19 MED ORDER — STUDY - ARNASA GB44332 - ASTEGOLIMAB 238 MG/1.7 ML OR PLACEBO SQ INJECTION (PI-RAMASWAMY)
476.0000 mg | INJECTION | SUBCUTANEOUS | Status: DC
  Administered 2023-02-25: 476 mg via SUBCUTANEOUS
  Filled 2023-02-19: qty 3.4

## 2023-02-25 ENCOUNTER — Encounter: Payer: Medicare HMO | Admitting: *Deleted

## 2023-02-25 DIAGNOSIS — Z006 Encounter for examination for normal comparison and control in clinical research program: Secondary | ICD-10-CM

## 2023-02-25 DIAGNOSIS — J441 Chronic obstructive pulmonary disease with (acute) exacerbation: Secondary | ICD-10-CM

## 2023-02-25 NOTE — Research (Signed)
Title: A Phase III, randomized, double-blind, placebo controlled, multicenter study to evaluate the efficacy and safety of astegolimab in patients with chronic obstructive pulmonary disease    Dose and Duration of Treatment: Astegolimab is presented as a sterile, slightly brown-yellow solution. Each single-use, 2.25 mL pre-filled syringe contains 1.7 mL deliverable volume. Astegolimab drug product is formulated at 140 mg/mL astegolimab with 114 mM succinic acid, 200 mM L-arginine, 10 mM L-methionine, 0.06% (w/v) polysorbate 20, pH 5.7. Placebo for astegolimab is supplied in an identical pre-filled syringe configuration.   Protocol # [id number]   Sponsor: [employer] [address] [place]   Protocol: Version 3, dated 26/May2023, Version 4 dated 20Jun2023 IB: version 9.0 dated April 2024 ICF: Main version 12Apr2023, revised 17July2023 Mobile Nursing 01Sep2023, revised 01Sep2023 Lab Manual: V4.0.0 20Oct2023   Investigator Brochure Product: Astegolimab [phone])   Mechanism of action: Astegolimab (also known as [id number] or [id number]) is a fully human, IgG2 monoclonal antibody that binds with high affinity to the interleukin (IL)-33 (IL-33) receptor, ST2, thereby blocking the signaling of IL-33, an inflammatory cytokine of the IL-1 family and member of the "alarmin" class of molecules. Astegolimab binds with high affinity to the human and cynomolgus monkey receptor for IL-33, ST2, and blocks IL-33 binding, thus inhibiting association with the IL-1R accessory protein (AcP) co-receptor and formation of an activated receptor complex.   Key Inclusion Criteria: Age 52-80 years at Visit 1   Documented COPD diagnosis for ?12 months prior to Visit 1 History of frequent exacerbations, defined as having had 2 or more moderate or severe COPD exacerbations within 12 months prior to Visit 1 Post-bronchodilator FEV1 ?20% and <80% of predicted at Visit 1 or Visit 2   Post-bronchodilator FEV1/FVC <0.70 at Visit 1 or Visit 2  mMRC score ?2 at screening   Current tobacco smoker or former smoker (having stopped smoking for at least 6 months prior to Visit 1) with a history of smoking ?10 pack-years On optimized COPD maintenance therapy as defined below for ?12 months prior to Visit 1            -Inhaled corticosteroid (ICS) plus long-acting beta-agonist (LABA)           -Long-acting muscarinic antagonist (LAMA) plus LABA           -ICS plus LAMA plus LABA  Key Exclusion Criteria: Current documented diagnosis of asthma  Diagnosis of a-1 antitrypsin deficiency  History of long-term treatment with oxygen at >4.0 liters/minute  Any infection that resulted in hospital admission for ?24 hours and/or treatment with oral, IV, or IM antibiotics within 4 weeks prior to Visit 1 or during screening Upper or lower respiratory tract infection within 4 weeks prior to Visit 1 or during screening Treatment with oral, IV, or IM corticosteroids (>10 mg/day prednisolone or equivalent) within 4 weeks prior to initiation of study drug Treatment with a licensed biologic agent (e.g., omalizumab, dupilumab, and/or anti-IL-5 therapies) within 3 months or 5 drug-elimination half-lives (whichever is longer) prior to screening  Planned surgical intervention during the study Known immunodeficiency, including but not limited to, HIV infection  AST, ALT, or total bilirubin elevation ?2.0 x the upper limit of normal (ULN) during screening  History of malignancy within 5 years prior to screening, with the exception of malignancies with a negligible risk of metastasis or death (e.g., 5-year overall survival rate >90%), such as adequately treated carcinoma in situ of the cervix, non-melanoma carcinoma, localized prostate cancer, or ductal carcinoma in situ  Unstable cardiac disease, myocardial infarction, or New York Heart Association Class III or IV heart failure within 12 months prior to  screening History or absence of an abnormal ECG that is deemed clinically significant by the investigator, including complete left bundle branch block or second- or third-degree atrioventricular heart block     Integrated Pharmacokinetic/Pharmacodynamic Analysis:  In Studies [id number], [id number], and [id number], exploratory biomarker analysis showed there were consistent decreases in blood eosinophil counts throughout the treatment period, potentially mediated by a direct effect of IL-33 on eosinophil progenitors. In [place], there was no significant difference in fractional exhaled nitric oxide (FeNO) levels (reflecting airway IL-4/IL-13 activity) between astegolimab-treated groups relative to placebo throughout the treatment period. These data suggest that astegolimab has only a limited effect on Type 2 inflammation in asthma. No data are applicable for Study [id number]. No data are available yet for Study [id number].  Special Warnings/Considerations: Administration of astegolimab, a protein therapeutic, may lead to the development of anti-astegolimab antibodies which could lead to AEs and/or decreased exposure. In non-clinical studies (see Section 4.2), ADA incidence has generally been low and there was no apparent ADA impact on PK and safety in these studies. To date, the immunogenicity rates observed with astegolimab in clinical studies have been relatively low as well (see Immunogenicity Section 5.6). Several clinical studies have been conducted in patients with asthma, atopic dermatitis, COPD (IIS Study [id number]) and COVID-19 severe pneumonia, which generally showed low incidence of ADAs (Table 31). There has been no correlation between ADA status and clinical findings or increased incidence of AEs. Astegolimab is now being considered in a larger study for the treatment of COPD. This patient population is typically considered to have a hyper-responsive immune system. Route of administration for this molecule  will be [place], either Q2W or Q4W. These factors increase the risk of development of an immune response to astegolimab, specifically with repeat dosing. From the previous IIS Study [id number], incidence of ADAs was low in COPD patients; this remains to be confirmed in a larger study. To monitor ADA development in ongoing studies, serum samples will be collected from patients at protocol-defined intervals. Patients who test positive for antibodies and have clinical sequelae that are considered potentially related to an ADA response may also be asked to return for additional follow-up testing.  Drug Interaction Studies: No PK drug interaction studies have been conducted to date.  Serious Adverse Reactions Observed in Asthma Study: During the treatment period, a similar proportion of patients across all cohorts experienced at least one AE, regardless of causality (Table 22). In total, 77.2%, 70.9%, 72.2%, and 72.1% of patients reported at least 1 AE in the placebo, 70 mg, 210 mg, and 490 mg groups, respectively. The most common AE's (>5% in any treatment group) were asthma, nasopharyngitis, upper respiratory tract infection, headache, and injection site reaction (ISR). The most common drug-related AE was ISR, which was reported more frequently in the astegolimab treatment groups than in the placebo group (1 [0.8%] patient in the placebo group, 10 [7.9%] patients with 70 mg, 8 [6.3%] patients with 210 mg, 6 [4.9%] patients with 490 mg). All ISRs were non-serious and mild or moderate in severity. During the treatment period, 50 SAEs were reported in 37 (7.4%) patients. The number of patients reporting SAEs was comparable across all cohorts (11 SAEs in 8 patients on placebo, 21 SAEs in 14 patients on 70 mg, 11 SAEs in 9 patients on 210 mg, and 7 SAEs in 6 patients on 490 mg). The  most common SAE was asthma. One SAE of moderate livedo reticularis (70 mg) was considered a suspected unexpected serious adverse drug reaction  (SUSAR) related to astegolimab and was reported two days after the second dose, leading to discontinuation of astegolimab. Two (0.4%) patients reported anaphylaxis and hypersensitivity reactions: 1 severe SAE of anaphylactic reaction (placebo), and 1 moderate hypersensitivity (490 mg) considered related to astegolimab. Three (0.6%) patients experienced a potential Major Adverse Cardiac Event (MACE) (1 patient each in the placebo, 70 mg, and 210 mg groups). None of the potential MACE were considered related to astegolimab. Overall, 233 (46.4%) patients reported events of infection. A comparable number of patients reported infection across all treatment groups (65 [51.2%] patients on placebo, 55 [43.3%] patients on 70 mg, 58 [46.0%] patients on 210 mg, and 55 [45.1%] patients on 490 mg). The most frequently reported infection (?10% incidence) was nasopharyngitis (12.7%). One patient on astegolimab 210 mg and the partner of one patient on placebo became pregnant during the study. Both delivered normal/healthy babies. Two deaths, unrelated to study drug, were reported: one patient on 210 mg astegolimab died following an SAE of asthma; the other patient on 490 mg astegolimab had an unexplained death. There were no clinically meaningful changes in laboratory parameters, vital signs, or ECG results, other than the 10% decrease in mean blood eosinophil counts in the astegolimab-treated groups, with no safety concerns. Treatment-induced ADAs were comparable between the astegolimab groups and had no impact on safety. Overall, astegolimab was well tolerated at all doses used and had a safety profile consistent with that observed in the previous astegolimab Phase I studies.  Serious Adverse Reactions Observed in Previous COPD Study: In the completed IIS Study [id number], a total of 81 patients received at least one dose of astegolimab or placebo. The safety profile of astegolimab was similar to that of placebo. There were a  total of 222 AEs reported in 62 patients. A total of 28 (72%) patients in the placebo arm and 34 (81%) patients in the astegolimab arm reported at least one AE. The most commonly reported AEs were headache followed by urinary tract infection and viral upper respiratory tract infection. A total of 39 SAEs were reported in 28 (35%) patients; 16 (41%) patients in the placebo group and 12 (29%) in the astegolimab group. The most commonly reported SAE was hospital admission for community acquired pneumonia. Four SAEs resulted in patient discontinuation from treatment (1 patient in the placebo group and 3 patients in the astegolimab group). One patient in the placebo group experienced an AESI of potential MACE (heart failure, unrelated to the study treatment). No anaphylaxis or pregnancies were reported. Two deaths, unrelated to study treatment, were reported: 1 patient in the placebo group died after hospital acquired pneumonia, and another patient in the placebo group died after pneumonia and type 2 respiratory failure.  Safety Data: Astegolimab has been generally well tolerated. There have been 49 patient deaths across all astegolimab studies (Section 5.5.2), none of which were considered related to astegolimab. A total of 144 subjects experienced a total of 224 SAEs across all astegolimab studies. Of these, an SAE livedo reticularis observed in 1 patient was considered related to astegolimab by the investigator (Section 5.5.3). AEs leading to withdrawal (Section 5.5.4) have generally occurred at a rate similar to what would be expected for clinical trials in the studies' respective indications.   [facility] [place] Clinical Research Coordinator note :    This visit for Subject [id number] with DOB: 26 Nov 1955 on 25 Feb 2023 for the above protocol is Visit/Encounter # 23 and is for purpose of research.    The consent for this encounter is under Protocol Version 2.0 , Investigator Brochure Version 7, Consent  Version IRB Approved  revised  and is currently IRB approved.    Subject expressed continued interest and consent in continuing as a study subject. Subject confirmed that there was no change in contact information (e.g. address, telephone, email). Subject thanked for participation in research and contribution to science.    The Subject was informed that the PI Dr. Marchelle Gearing continues to have oversight of the subject's visits and course  through relevant discussions, reviews and also specifically of this visit by routing of this note to the PI.  All procedures and assessments were performed per above state protocol. Subject tolerated injections well. Please refer to subjects paper source binder for further details of this visit.      Signed by Jerolyn Shin, [facility] Clinical Research Coordinator  [facility]  [place], [place]

## 2023-03-11 ENCOUNTER — Encounter: Payer: Medicare HMO | Admitting: *Deleted

## 2023-03-11 DIAGNOSIS — J441 Chronic obstructive pulmonary disease with (acute) exacerbation: Secondary | ICD-10-CM

## 2023-03-11 DIAGNOSIS — Z006 Encounter for examination for normal comparison and control in clinical research program: Secondary | ICD-10-CM

## 2023-03-11 MED ORDER — STUDY - ARNASA GB44332 - ASTEGOLIMAB 238 MG/1.7 ML OR PLACEBO SQ INJECTION (PI-RAMASWAMY)
476.0000 mg | INJECTION | SUBCUTANEOUS | Status: DC
  Administered 2023-03-11: 476 mg via SUBCUTANEOUS
  Filled 2023-03-11: qty 3.4

## 2023-03-12 ENCOUNTER — Other Ambulatory Visit: Payer: Self-pay | Admitting: Acute Care

## 2023-03-12 DIAGNOSIS — Z87891 Personal history of nicotine dependence: Secondary | ICD-10-CM

## 2023-03-12 DIAGNOSIS — Z122 Encounter for screening for malignant neoplasm of respiratory organs: Secondary | ICD-10-CM

## 2023-03-12 NOTE — Research (Signed)
Title: A Phase III, randomized, double-blind, placebo controlled, multicenter study to evaluate the efficacy and safety of astegolimab in patients with chronic obstructive pulmonary disease    Dose and Duration of Treatment: Astegolimab is presented as a sterile, slightly brown-yellow solution. Each single-use, 2.25 mL pre-filled syringe contains 1.7 mL deliverable volume. Astegolimab drug product is formulated at 140 mg/mL astegolimab with 114 mM succinic acid, 200 mM L-arginine, 10 mM L-methionine, 0.06% (w/v) polysorbate 20, pH 5.7. Placebo for astegolimab is supplied in an identical pre-filled syringe configuration.   Protocol # [id number]   Sponsor: [employer] [address] [place]  Protocol: Version 3, dated 26/May2023, Version 4 dated 20Jun2023 IB: version 9.0 dated April 2024 ICF: Main version 12Apr2023, revised 17July2023 Mobile Nursing 01Sep2023, revised 01Sep2023 Lab Manual: V4.0.0 20Oct2023   Mechanism of action: Astegolimab (also known as [id number] or [id number]) is a fully human, IgG2 monoclonal antibody that binds with high affinity to the interleukin (IL)-33 (IL-33) receptor, ST2, thereby blocking the signaling of IL-33, an inflammatory cytokine of the IL-1 family and member of the "alarmin" class of molecules. Astegolimab binds with high affinity to the human and cynomolgus monkey receptor for IL-33, ST2, and blocks IL-33 binding, thus inhibiting association with the IL-1R accessory protein (AcP) co-receptor and formation of an activated receptor complex.    Integrated Pharmacokinetic/Pharmacodynamic Analysis:  In Studies [id number], [id number], and [id number], exploratory biomarker analysis showed there were consistent decreases in blood eosinophil counts throughout the treatment period, potentially mediated by a direct effect of IL-33 on eosinophil progenitors. In [place], there was no significant difference in fractional exhaled nitric oxide (FeNO)  levels (reflecting airway IL-4/IL-13 activity) between astegolimab-treated groups relative to placebo throughout the treatment period. These data suggest that astegolimab has only a limited effect on Type 2 inflammation in asthma. No data are applicable for Study [id number]. No data are available yet for Study [id number].  Special Warnings/Considerations: Administration of astegolimab, a protein therapeutic, may lead to the development of anti-astegolimab antibodies which could lead to AEs and/or decreased exposure. In non-clinical studies (see Section 4.2), ADA incidence has generally been low and there was no apparent ADA impact on PK and safety in these studies. To date, the immunogenicity rates observed with astegolimab in clinical studies have been relatively low as well (see Immunogenicity Section 5.6). Several clinical studies have been conducted in patients with asthma, atopic dermatitis, COPD (IIS Study [id number]) and COVID-19 severe pneumonia, which generally showed low incidence of ADAs (Table 31). There has been no correlation between ADA status and clinical findings or increased incidence of AEs. Astegolimab is now being considered in a larger study for the treatment of COPD. This patient population is typically considered to have a hyper-responsive immune system. Route of administration for this molecule will be [place], either Q2W or Q4W. These factors increase the risk of development of an immune response to astegolimab, specifically with repeat dosing. From the previous IIS Study [id number], incidence of ADAs was low in COPD patients; this remains to be confirmed in a larger study. To monitor ADA development in ongoing studies, serum samples will be collected from patients at protocol-defined intervals. Patients who test positive for antibodies and have clinical sequelae that are considered potentially related to an ADA response may also be asked to return for additional follow-up testing.  Drug Interaction  Studies: No PK drug interaction studies have been conducted to date.  Serious Adverse Reactions Observed in Asthma Study: During the treatment period, a similar proportion  of patients across all cohorts experienced at least one AE, regardless of causality (Table 22). In total, 77.2%, 70.9%, 72.2%, and 72.1% of patients reported at least 1 AE in the placebo, 70 mg, 210 mg, and 490 mg groups, respectively. The most common AE's (>5% in any treatment group) were asthma, nasopharyngitis, upper respiratory tract infection, headache, and injection site reaction (ISR). The most common drug-related AE was ISR, which was reported more frequently in the astegolimab treatment groups than in the placebo group (1 [0.8%] patient in the placebo group, 10 [7.9%] patients with 70 mg, 8 [6.3%] patients with 210 mg, 6 [4.9%] patients with 490 mg). All ISRs were non-serious and mild or moderate in severity. During the treatment period, 50 SAEs were reported in 37 (7.4%) patients. The number of patients reporting SAEs was comparable across all cohorts (11 SAEs in 8 patients on placebo, 21 SAEs in 14 patients on 70 mg, 11 SAEs in 9 patients on 210 mg, and 7 SAEs in 6 patients on 490 mg). The most common SAE was asthma. One SAE of moderate livedo reticularis (70 mg) was considered a suspected unexpected serious adverse drug reaction (SUSAR) related to astegolimab and was reported two days after the second dose, leading to discontinuation of astegolimab. Two (0.4%) patients reported anaphylaxis and hypersensitivity reactions: 1 severe SAE of anaphylactic reaction (placebo), and 1 moderate hypersensitivity (490 mg) considered related to astegolimab. Three (0.6%) patients experienced a potential Major Adverse Cardiac Event (MACE) (1 patient each in the placebo, 70 mg, and 210 mg groups). None of the potential MACE were considered related to astegolimab. Overall, 233 (46.4%) patients reported events of infection. A comparable number of  patients reported infection across all treatment groups (65 [51.2%] patients on placebo, 55 [43.3%] patients on 70 mg, 58 [46.0%] patients on 210 mg, and 55 [45.1%] patients on 490 mg). The most frequently reported infection (?10% incidence) was nasopharyngitis (12.7%). One patient on astegolimab 210 mg and the partner of one patient on placebo became pregnant during the study. Both delivered normal/healthy babies. Two deaths, unrelated to study drug, were reported: one patient on 210 mg astegolimab died following an SAE of asthma; the other patient on 490 mg astegolimab had an unexplained death. There were no clinically meaningful changes in laboratory parameters, vital signs, or ECG results, other than the 10% decrease in mean blood eosinophil counts in the astegolimab-treated groups, with no safety concerns. Treatment-induced ADAs were comparable between the astegolimab groups and had no impact on safety. Overall, astegolimab was well tolerated at all doses used and had a safety profile consistent with that observed in the previous astegolimab Phase I studies.  Serious Adverse Reactions Observed in Previous COPD Study: In the completed IIS Study [id number], a total of 81 patients received at least one dose of astegolimab or placebo. The safety profile of astegolimab was similar to that of placebo. There were a total of 222 AEs reported in 62 patients. A total of 28 (72%) patients in the placebo arm and 34 (81%) patients in the astegolimab arm reported at least one AE. The most commonly reported AEs were headache followed by urinary tract infection and viral upper respiratory tract infection. A total of 39 SAEs were reported in 28 (35%) patients; 16 (41%) patients in the placebo group and 12 (29%) in the astegolimab group. The most commonly reported SAE was hospital admission for community acquired pneumonia. Four SAEs resulted in patient discontinuation from treatment (1 patient in the placebo group and 3  patients  in the astegolimab group). One patient in the placebo group experienced an AESI of potential MACE (heart failure, unrelated to the study treatment). No anaphylaxis or pregnancies were reported. Two deaths, unrelated to study treatment, were reported: 1 patient in the placebo group died after hospital acquired pneumonia, and another patient in the placebo group died after pneumonia and type 2 respiratory failure.  Safety Data: Astegolimab has been generally well tolerated. There have been 49 patient deaths across all astegolimab studies (Section 5.5.2), none of which were considered related to astegolimab. A total of 144 subjects experienced a total of 224 SAEs across all astegolimab studies. Of these, an SAE livedo reticularis observed in 1 patient was considered related to astegolimab by the investigator (Section 5.5.3). AEs leading to withdrawal (Section 5.5.4) have generally occurred at a rate similar to what would be expected for clinical trials in the studies' respective indications.   [facility] [place] Clinical Research Coordinator note :    This visit for Subject Katie Greene with DOB: Dec 19, 1955 on 03/11/2023 for the above protocol is Visit/Encounter # 24  and is for purpose of research.    The consent for this encounter is under Protocol Version 2.0 , Investigator Brochure Version 7, Consent Version IRB Approved  revised  and is currently IRB approved.    Subject expressed continued interest and consent in continuing as a study subject. Subject confirmed that there was no change in contact information (e.g. address, telephone, email). Subject thanked for participation in research and contribution to science.    The Subject was informed that the PI Dr. Marchelle Gearing continues to have oversight of the subject's visits and course  through relevant discussions, reviews and also specifically of this visit by routing of this note to the PI.  [facility] [place] Clinical Research  Coordinator note:   This visit for Subject Katie Greene with DOB: [birth year] on 03/11/2023 for the above protocol is Visit/Encounter # 24  and is for purpose of research.   Subject  expressed continued interest and consent in continuing as a study subject. Subject confirmed that there was  no  change in contact information (e.g. address, telephone, email). Subject thanked for participation in research and contribution to science. The Subject was yes (YES) informed that the PI Murali Ramaswamy,MD continues to have oversight of the subject's visits and course through relevant discussions, reviews, and also specifically of this visit by routing of this note to the PI.  All procedure and assessments were performed per above stated protocol.  Subject tolerated injections well.  Please refer to subjects paper source binder for further details of this visit.  Signed by  Laurelyn Sickle  Clinical Research Coordinator  [facility]  [place], [place] 7:52 AM 03/12/2023

## 2023-03-25 ENCOUNTER — Encounter: Payer: Medicare HMO | Admitting: *Deleted

## 2023-03-25 DIAGNOSIS — Z006 Encounter for examination for normal comparison and control in clinical research program: Secondary | ICD-10-CM

## 2023-03-25 DIAGNOSIS — J441 Chronic obstructive pulmonary disease with (acute) exacerbation: Secondary | ICD-10-CM

## 2023-03-25 MED ORDER — STUDY - ARNASA GB44332 - ASTEGOLIMAB 238 MG/1.7 ML OR PLACEBO SQ INJECTION (PI-RAMASWAMY)
476.0000 mg | INJECTION | SUBCUTANEOUS | Status: DC
  Administered 2023-03-25: 476 mg via SUBCUTANEOUS
  Filled 2023-03-25: qty 3.4

## 2023-03-25 NOTE — Research (Cosign Needed)
Title: A Phase III, randomized, double-blind, placebo controlled, multicenter study to evaluate the efficacy and safety of astegolimab in patients with chronic obstructive pulmonary disease    Dose and Duration of Treatment: Astegolimab is presented as a sterile, slightly brown-yellow solution. Each single-use, 2.25 mL pre-filled syringe contains 1.7 mL deliverable volume. Astegolimab drug product is formulated at 140 mg/mL astegolimab with 114 mM succinic acid, 200 mM L-arginine, 10 mM L-methionine, 0.06% (w/v) polysorbate 20, pH 5.7. Placebo for astegolimab is supplied in an identical pre-filled syringe configuration.   Protocol # [id number]   Sponsor: [employer] [address] [place] Protocol: Version 3, dated 26/May2023, Version 4 dated 20Jun2023 IB: version 9.0 dated April 2024 ICF: Main version 12Apr2023, revised 17July2023 [facility] 01Sep2023, revised 01Sep2023 Lab Manual: V4.0.0 20Oct2023    Mechanism of action: Astegolimab (also known as [id number] or [id number]) is a fully human, IgG2 monoclonal antibody that binds with high affinity to the interleukin (IL)-33 (IL-33) receptor, ST2, thereby blocking the signaling of IL-33, an inflammatory cytokine of the IL-1 family and member of the "alarmin" class of molecules. Astegolimab binds with high affinity to the human and cynomolgus monkey receptor for IL-33, ST2, and blocks IL-33 binding, thus inhibiting association with the IL-1R accessory protein (AcP) co-receptor and formation of an activated receptor complex.   Key Inclusion Criteria: Age 57-80 years at Visit 1   Documented COPD diagnosis for ?12 months prior to Visit 1 History of frequent exacerbations, defined as having had 2 or more moderate or severe COPD exacerbations within 12 months prior to Visit 1 Post-bronchodilator FEV1 ?20% and <80% of predicted at Visit 1 or Visit 2  Post-bronchodilator FEV1/FVC <0.70 at Visit 1 or Visit 2  mMRC score  ?2 at screening   Current tobacco smoker or former smoker (having stopped smoking for at least 6 months prior to Visit 1) with a history of smoking ?10 pack-years On optimized COPD maintenance therapy as defined below for ?12 months prior to Visit 1            -Inhaled corticosteroid (ICS) plus long-acting beta-agonist (LABA)           -Long-acting muscarinic antagonist (LAMA) plus LABA           -ICS plus LAMA plus LABA  Key Exclusion Criteria: Current documented diagnosis of asthma  Diagnosis of a-1 antitrypsin deficiency  History of long-term treatment with oxygen at >4.0 liters/minute  Any infection that resulted in hospital admission for ?24 hours and/or treatment with oral, IV, or IM antibiotics within 4 weeks prior to Visit 1 or during screening Upper or lower respiratory tract infection within 4 weeks prior to Visit 1 or during screening Treatment with oral, IV, or IM corticosteroids (>10 mg/day prednisolone or equivalent) within 4 weeks prior to initiation of study drug Treatment with a licensed biologic agent (e.g., omalizumab, dupilumab, and/or anti-IL-5 therapies) within 3 months or 5 drug-elimination half-lives (whichever is longer) prior to screening  Planned surgical intervention during the study Known immunodeficiency, including but not limited to, HIV infection  AST, ALT, or total bilirubin elevation ?2.0 x the upper limit of normal (ULN) during screening  History of malignancy within 5 years prior to screening, with the exception of malignancies with a negligible risk of metastasis or death (e.g., 5-year overall survival rate >90%), such as adequately treated carcinoma in situ of the cervix, non-melanoma carcinoma, localized prostate cancer, or ductal carcinoma in situ Unstable cardiac disease, myocardial infarction, or [place]  Heart Association Class III or IV heart failure within 12 months prior to screening History or absence of an abnormal ECG that is deemed clinically  significant by the investigator, including complete left bundle branch block or second- or third-degree atrioventricular heart block     Integrated Pharmacokinetic/Pharmacodynamic Analysis:  In Studies [id number], [id number], and [id number], exploratory biomarker analysis showed there were consistent decreases in blood eosinophil counts throughout the treatment period, potentially mediated by a direct effect of IL-33 on eosinophil progenitors. In [place], there was no significant difference in fractional exhaled nitric oxide (FeNO) levels (reflecting airway IL-4/IL-13 activity) between astegolimab-treated groups relative to placebo throughout the treatment period. These data suggest that astegolimab has only a limited effect on Type 2 inflammation in asthma. No data are applicable for Study [id number]. No data are available yet for Study [id number].  Special Warnings/Considerations: Administration of astegolimab, a protein therapeutic, may lead to the development of anti-astegolimab antibodies which could lead to AEs and/or decreased exposure. In non-clinical studies (see Section 4.2), ADA incidence has generally been low and there was no apparent ADA impact on PK and safety in these studies. To date, the immunogenicity rates observed with astegolimab in clinical studies have been relatively low as well (see Immunogenicity Section 5.6). Several clinical studies have been conducted in patients with asthma, atopic dermatitis, COPD (IIS Study [id number]) and COVID-19 severe pneumonia, which generally showed low incidence of ADAs (Table 31). There has been no correlation between ADA status and clinical findings or increased incidence of AEs. Astegolimab is now being considered in a larger study for the treatment of COPD. This patient population is typically considered to have a hyper-responsive immune system. Route of administration for this molecule will be [place], either Q2W or Q4W. These factors increase the risk of development  of an immune response to astegolimab, specifically with repeat dosing. From the previous IIS Study [id number], incidence of ADAs was low in COPD patients; this remains to be confirmed in a larger study. To monitor ADA development in ongoing studies, serum samples will be collected from patients at protocol-defined intervals. Patients who test positive for antibodies and have clinical sequelae that are considered potentially related to an ADA response may also be asked to return for additional follow-up testing.  Drug Interaction Studies: No PK drug interaction studies have been conducted to date.  Serious Adverse Reactions Observed in Asthma Study: During the treatment period, a similar proportion of patients across all cohorts experienced at least one AE, regardless of causality (Table 22). In total, 77.2%, 70.9%, 72.2%, and 72.1% of patients reported at least 1 AE in the placebo, 70 mg, 210 mg, and 490 mg groups, respectively. The most common AE's (>5% in any treatment group) were asthma, nasopharyngitis, upper respiratory tract infection, headache, and injection site reaction (ISR). The most common drug-related AE was ISR, which was reported more frequently in the astegolimab treatment groups than in the placebo group (1 [0.8%] patient in the placebo group, 10 [7.9%] patients with 70 mg, 8 [6.3%] patients with 210 mg, 6 [4.9%] patients with 490 mg). All ISRs were non-serious and mild or moderate in severity. During the treatment period, 50 SAEs were reported in 37 (7.4%) patients. The number of patients reporting SAEs was comparable across all cohorts (11 SAEs in 8 patients on placebo, 21 SAEs in 14 patients on 70 mg, 11 SAEs in 9 patients on 210 mg, and 7 SAEs in 6 patients on 490 mg). The most common SAE was asthma. One SAE of  moderate livedo reticularis (70 mg) was considered a suspected unexpected serious adverse drug reaction (SUSAR) related to astegolimab and was reported two days after the second dose,  leading to discontinuation of astegolimab. Two (0.4%) patients reported anaphylaxis and hypersensitivity reactions: 1 severe SAE of anaphylactic reaction (placebo), and 1 moderate hypersensitivity (490 mg) considered related to astegolimab. Three (0.6%) patients experienced a potential Major Adverse Cardiac Event (MACE) (1 patient each in the placebo, 70 mg, and 210 mg groups). None of the potential MACE were considered related to astegolimab. Overall, 233 (46.4%) patients reported events of infection. A comparable number of patients reported infection across all treatment groups (65 [51.2%] patients on placebo, 55 [43.3%] patients on 70 mg, 58 [46.0%] patients on 210 mg, and 55 [45.1%] patients on 490 mg). The most frequently reported infection (?10% incidence) was nasopharyngitis (12.7%). One patient on astegolimab 210 mg and the partner of one patient on placebo became pregnant during the study. Both delivered normal/healthy babies. Two deaths, unrelated to study drug, were reported: one patient on 210 mg astegolimab died following an SAE of asthma; the other patient on 490 mg astegolimab had an unexplained death. There were no clinically meaningful changes in laboratory parameters, vital signs, or ECG results, other than the 10% decrease in mean blood eosinophil counts in the astegolimab-treated groups, with no safety concerns. Treatment-induced ADAs were comparable between the astegolimab groups and had no impact on safety. Overall, astegolimab was well tolerated at all doses used and had a safety profile consistent with that observed in the previous astegolimab Phase I studies.  Serious Adverse Reactions Observed in Previous COPD Study: In the completed IIS Study [id number], a total of 81 patients received at least one dose of astegolimab or placebo. The safety profile of astegolimab was similar to that of placebo. There were a total of 222 AEs reported in 62 patients. A total of 28 (72%) patients in the  placebo arm and 34 (81%) patients in the astegolimab arm reported at least one AE. The most commonly reported AEs were headache followed by urinary tract infection and viral upper respiratory tract infection. A total of 39 SAEs were reported in 28 (35%) patients; 16 (41%) patients in the placebo group and 12 (29%) in the astegolimab group. The most commonly reported SAE was hospital admission for community acquired pneumonia. Four SAEs resulted in patient discontinuation from treatment (1 patient in the placebo group and 3 patients in the astegolimab group). One patient in the placebo group experienced an AESI of potential MACE (heart failure, unrelated to the study treatment). No anaphylaxis or pregnancies were reported. Two deaths, unrelated to study treatment, were reported: 1 patient in the placebo group died after hospital acquired pneumonia, and another patient in the placebo group died after pneumonia and type 2 respiratory failure.  Safety Data: Astegolimab has been generally well tolerated. There have been 49 patient deaths across all astegolimab studies (Section 5.5.2), none of which were considered related to astegolimab. A total of 144 subjects experienced a total of 224 SAEs across all astegolimab studies. Of these, an SAE livedo reticularis observed in 1 patient was considered related to astegolimab by the investigator (Section 5.5.3). AEs leading to withdrawal (Section 5.5.4) have generally occurred at a rate similar to what would be expected for clinical trials in the studies' respective indications.   [facility] [place] Clinical Research Coordinator note :    This visit for Subject [id number] with DOB: [birth year] on 25 Mar 2023 for the above protocol is  Visit/Encounter # 25  and is for purpose of research.    The consent for this encounter is under Protocol Version 2.0 , Investigator Brochure Version 7, Consent Version IRB Approved  revised  and is currently IRB approved.    Subject  expressed continued interest and consent in continuing as a study subject. Subject confirmed that there was no change in contact information (e.g. address, telephone, email). Subject thanked for participation in research and contribution to science.    The Subject was informed that the PI Dr. Marchelle Gearing continues to have oversight of the subject's visits and course  through relevant discussions, reviews and also specifically of this visit by routing of this note to the PI.  All procedures and assessments were performed per above stated protocol. Subject tolerated injections well. Please refer to subjects paper source binder for further details of this visit.        Signed by Jerolyn Shin ,[facility] Clinical Research Coordinator  [facility]  [place], [place]

## 2023-04-07 DIAGNOSIS — Z1231 Encounter for screening mammogram for malignant neoplasm of breast: Secondary | ICD-10-CM | POA: Diagnosis not present

## 2023-04-08 ENCOUNTER — Encounter: Payer: Medicare HMO | Admitting: *Deleted

## 2023-04-08 DIAGNOSIS — J441 Chronic obstructive pulmonary disease with (acute) exacerbation: Secondary | ICD-10-CM

## 2023-04-08 DIAGNOSIS — Z006 Encounter for examination for normal comparison and control in clinical research program: Secondary | ICD-10-CM

## 2023-04-08 MED ORDER — STUDY - ARNASA GB44332 - ASTEGOLIMAB 238 MG/1.7 ML OR PLACEBO SQ INJECTION (PI-RAMASWAMY)
476.0000 mg | INJECTION | SUBCUTANEOUS | Status: DC
  Administered 2023-04-08: 476 mg via SUBCUTANEOUS
  Filled 2023-04-08: qty 3.4

## 2023-04-08 NOTE — Research (Cosign Needed)
Title: A Phase III, randomized, double-blind, placebo controlled, multicenter study to evaluate the efficacy and safety of astegolimab in patients with chronic obstructive pulmonary disease    Dose and Duration of Treatment: Astegolimab is presented as a sterile, slightly brown-yellow solution. Each single-use, 2.25 mL pre-filled syringe contains 1.7 mL deliverable volume. Astegolimab drug product is formulated at 140 mg/mL astegolimab with 114 mM succinic acid, 200 mM L-arginine, 10 mM L-methionine, 0.06% (w/v) polysorbate 20, pH 5.7. Placebo for astegolimab is supplied in an identical pre-filled syringe configuration.   Protocol # [id number]   Sponsor: [employer] [address], [place] Protocol: Version 3, dated 26/May2023, Version 4 dated 20Jun2023 IB: version 9.0 dated April 2024 ICF: Main version 12Apr2023, revised 17July2023 Mobile Nursing 01Sep2023, revised 01Sep2023 Lab Manual: V4.0.0 20Oct2023     Mechanism of action: Astegolimab (also known as [id number] or [id number]) is a fully human, IgG2 monoclonal antibody that binds with high affinity to the interleukin (IL)-33 (IL-33) receptor, ST2, thereby blocking the signaling of IL-33, an inflammatory cytokine of the IL-1 family and member of the "alarmin" class of molecules. Astegolimab binds with high affinity to the human and cynomolgus monkey receptor for IL-33, ST2, and blocks IL-33 binding, thus inhibiting association with the IL-1R accessory protein (AcP) co-receptor and formation of an activated receptor complex.   Key Inclusion Criteria: Age 54-80 years at Visit 1   Documented COPD diagnosis for ?12 months prior to Visit 1 History of frequent exacerbations, defined as having had 2 or more moderate or severe COPD exacerbations within 12 months prior to Visit 1 Post-bronchodilator FEV1 ?20% and <80% of predicted at Visit 1 or Visit 2  Post-bronchodilator FEV1/FVC <0.70 at Visit 1 or Visit 2  mMRC  score ?2 at screening   Current tobacco smoker or former smoker (having stopped smoking for at least 6 months prior to Visit 1) with a history of smoking ?10 pack-years On optimized COPD maintenance therapy as defined below for ?12 months prior to Visit 1            -Inhaled corticosteroid (ICS) plus long-acting beta-agonist (LABA)           -Long-acting muscarinic antagonist (LAMA) plus LABA           -ICS plus LAMA plus LABA  Key Exclusion Criteria: Current documented diagnosis of asthma  Diagnosis of a-1 antitrypsin deficiency  History of long-term treatment with oxygen at >4.0 liters/minute  Any infection that resulted in hospital admission for ?24 hours and/or treatment with oral, IV, or IM antibiotics within 4 weeks prior to Visit 1 or during screening Upper or lower respiratory tract infection within 4 weeks prior to Visit 1 or during screening Treatment with oral, IV, or IM corticosteroids (>10 mg/day prednisolone or equivalent) within 4 weeks prior to initiation of study drug Treatment with a licensed biologic agent (e.g., omalizumab, dupilumab, and/or anti-IL-5 therapies) within 3 months or 5 drug-elimination half-lives (whichever is longer) prior to screening  Planned surgical intervention during the study Known immunodeficiency, including but not limited to, HIV infection  AST, ALT, or total bilirubin elevation ?2.0 x the upper limit of normal (ULN) during screening  History of malignancy within 5 years prior to screening, with the exception of malignancies with a negligible risk of metastasis or death (e.g., 5-year overall survival rate >90%), such as adequately treated carcinoma in situ of the cervix, non-melanoma carcinoma, localized prostate cancer, or ductal carcinoma in situ Unstable cardiac disease, myocardial infarction, or [place] Heart Association Class III or IV heart failure within 12 months prior to screening History or absence of an abnormal ECG that is deemed  clinically significant by the investigator, including complete left bundle branch block or second- or third-degree atrioventricular heart block     Integrated Pharmacokinetic/Pharmacodynamic Analysis:  In Studies EX52841, [id number], and [id number], exploratory biomarker analysis showed there were consistent decreases in blood eosinophil counts throughout the treatment period, potentially mediated by a direct effect of IL-33 on eosinophil progenitors. In [place], there was no significant difference in fractional exhaled nitric oxide (FeNO) levels (reflecting airway IL-4/IL-13 activity) between astegolimab-treated groups relative to placebo throughout the treatment period. These data suggest that astegolimab has only a limited effect on Type 2 inflammation in asthma. No data are applicable for Study [id number]. No data are available yet for Study [id number].  Special Warnings/Considerations: Administration of astegolimab, a protein therapeutic, may lead to the development of anti-astegolimab antibodies which could lead to AEs and/or decreased exposure. In non-clinical studies (see Section 4.2), ADA incidence has generally been low and there was no apparent ADA impact on PK and safety in these studies. To date, the immunogenicity rates observed with astegolimab in clinical studies have been relatively low as well (see Immunogenicity Section 5.6). Several clinical studies have been conducted in patients with asthma, atopic dermatitis, COPD (IIS Study [id number]) and COVID-19 severe pneumonia, which generally showed low incidence of ADAs (Table 31). There has been no correlation between ADA status and clinical findings or increased incidence of AEs. Astegolimab is now being considered in a larger study for the treatment of COPD. This patient population is typically considered to have a hyper-responsive immune system. Route of administration for this molecule will be [place], either Q2W or Q4W. These factors increase the risk of  development of an immune response to astegolimab, specifically with repeat dosing. From the previous IIS Study [id number], incidence of ADAs was low in COPD patients; this remains to be confirmed in a larger study. To monitor ADA development in ongoing studies, serum samples will be collected from patients at protocol-defined intervals. Patients who test positive for antibodies and have clinical sequelae that are considered potentially related to an ADA response may also be asked to return for additional follow-up testing.  Drug Interaction Studies: No PK drug interaction studies have been conducted to date.  Serious Adverse Reactions Observed in Asthma Study: During the treatment period, a similar proportion of patients across all cohorts experienced at least one AE, regardless of causality (Table 22). In total, 77.2%, 70.9%, 72.2%, and 72.1% of patients reported at least 1 AE in the placebo, 70 mg, 210 mg, and 490 mg groups, respectively. The most common AE's (>5% in any treatment group) were asthma, nasopharyngitis, upper respiratory tract infection, headache, and injection site reaction (ISR). The most common drug-related AE was ISR, which was reported more frequently in the astegolimab treatment groups than in the placebo group (1 [0.8%] patient in the placebo group, 10 [7.9%] patients with 70 mg, 8 [6.3%] patients with 210 mg, 6 [4.9%] patients with 490 mg). All ISRs were non-serious and mild or moderate in severity. During the treatment period, 50 SAEs were reported in 37 (7.4%) patients. The number of patients reporting SAEs was comparable across all cohorts (11 SAEs in 8 patients on placebo, 21 SAEs in 14 patients on 70 mg, 11 SAEs in 9 patients on 210 mg, and 7 SAEs in 6 patients on 490 mg). The most common SAE was asthma. One SAE  of moderate livedo reticularis (70 mg) was considered a suspected unexpected serious adverse drug reaction (SUSAR) related to astegolimab and was reported two days after the  second dose, leading to discontinuation of astegolimab. Two (0.4%) patients reported anaphylaxis and hypersensitivity reactions: 1 severe SAE of anaphylactic reaction (placebo), and 1 moderate hypersensitivity (490 mg) considered related to astegolimab. Three (0.6%) patients experienced a potential Major Adverse Cardiac Event (MACE) (1 patient each in the placebo, 70 mg, and 210 mg groups). None of the potential MACE were considered related to astegolimab. Overall, 233 (46.4%) patients reported events of infection. A comparable number of patients reported infection across all treatment groups (65 [51.2%] patients on placebo, 55 [43.3%] patients on 70 mg, 58 [46.0%] patients on 210 mg, and 55 [45.1%] patients on 490 mg). The most frequently reported infection (?10% incidence) was nasopharyngitis (12.7%). One patient on astegolimab 210 mg and the partner of one patient on placebo became pregnant during the study. Both delivered normal/healthy babies. Two deaths, unrelated to study drug, were reported: one patient on 210 mg astegolimab died following an SAE of asthma; the other patient on 490 mg astegolimab had an unexplained death. There were no clinically meaningful changes in laboratory parameters, vital signs, or ECG results, other than the 10% decrease in mean blood eosinophil counts in the astegolimab-treated groups, with no safety concerns. Treatment-induced ADAs were comparable between the astegolimab groups and had no impact on safety. Overall, astegolimab was well tolerated at all doses used and had a safety profile consistent with that observed in the previous astegolimab Phase I studies.  Serious Adverse Reactions Observed in Previous COPD Study: In the completed IIS Study [id number], a total of 81 patients received at least one dose of astegolimab or placebo. The safety profile of astegolimab was similar to that of placebo. There were a total of 222 AEs reported in 62 patients. A total of 28 (72%)  patients in the placebo arm and 34 (81%) patients in the astegolimab arm reported at least one AE. The most commonly reported AEs were headache followed by urinary tract infection and viral upper respiratory tract infection. A total of 39 SAEs were reported in 28 (35%) patients; 16 (41%) patients in the placebo group and 12 (29%) in the astegolimab group. The most commonly reported SAE was hospital admission for community acquired pneumonia. Four SAEs resulted in patient discontinuation from treatment (1 patient in the placebo group and 3 patients in the astegolimab group). One patient in the placebo group experienced an AESI of potential MACE (heart failure, unrelated to the study treatment). No anaphylaxis or pregnancies were reported. Two deaths, unrelated to study treatment, were reported: 1 patient in the placebo group died after hospital acquired pneumonia, and another patient in the placebo group died after pneumonia and type 2 respiratory failure.  Safety Data: Astegolimab has been generally well tolerated. There have been 49 patient deaths across all astegolimab studies (Section 5.5.2), none of which were considered related to astegolimab. A total of 144 subjects experienced a total of 224 SAEs across all astegolimab studies. Of these, an SAE livedo reticularis observed in 1 patient was considered related to astegolimab by the investigator (Section 5.5.3). AEs leading to withdrawal (Section 5.5.4) have generally occurred at a rate similar to what would be expected for clinical trials in the studies' respective indications.   [facility] [place] Clinical Research Coordinator note :    This visit for Subject [id number] with DOB: 1956-03-20 on 08 Apr 2023 for the above protocol  is Visit/Encounter # 26 and is for purpose of research.       Subject expressed continued interest and consent in continuing as a study subject. Subject confirmed that there was no change in contact information (e.g. address,  telephone, email). Subject thanked for participation in research and contribution to science.   The Subject was informed that the PI Dr. Marchelle Gearing continues to have oversight of the subject's visits and course  through relevant discussions, reviews and also specifically of this visit by routing of this note to the PI. All procedures and assessments were performed per above stated protocol. Subject tolerated injections well. Please refer to subjects paper source binder for further details of this visit.      Signed by Laurelyn Sickle Clinical Research Coordinator  [facility]  [place], [place]

## 2023-04-22 ENCOUNTER — Encounter: Payer: Medicare HMO | Admitting: *Deleted

## 2023-04-22 DIAGNOSIS — Z006 Encounter for examination for normal comparison and control in clinical research program: Secondary | ICD-10-CM

## 2023-04-22 DIAGNOSIS — J441 Chronic obstructive pulmonary disease with (acute) exacerbation: Secondary | ICD-10-CM

## 2023-04-22 MED ORDER — STUDY - ARNASA GB44332 - ASTEGOLIMAB 238 MG/1.7 ML OR PLACEBO SQ INJECTION (PI-RAMASWAMY)
476.0000 mg | INJECTION | SUBCUTANEOUS | Status: AC
  Administered 2023-04-22: 476 mg via SUBCUTANEOUS
  Filled 2023-04-22: qty 3.4

## 2023-04-22 NOTE — Research (Cosign Needed)
Title: A Phase III, randomized, double-blind, placebo controlled, multicenter study to evaluate the efficacy and safety of astegolimab in patients with chronic obstructive pulmonary disease    Dose and Duration of Treatment: Astegolimab is presented as a sterile, slightly brown-yellow solution. Each single-use, 2.25 mL pre-filled syringe contains 1.7 mL deliverable volume. Astegolimab drug product is formulated at 140 mg/mL astegolimab with 114 mM succinic acid, 200 mM L-arginine, 10 mM L-methionine, 0.06% (w/v) polysorbate 20, pH 5.7. Placebo for astegolimab is supplied in an identical pre-filled syringe configuration.   Protocol # [id number]   Sponsor: [employer] [address] [place]   Designer, fashion/clothing Product: Astegolimab [phone]) Protocol: Version 3, dated 26/May2023, Version 4 dated 20Jun2023 IB: version 9.0 dated April 2024 ICF: Main version 12Apr2023, revised 17July2023 Mobile Nursing: 01Sep2023, revised 01Sep2023 Lab Manual: V4.0.0 20Oct2023    Mechanism of action: Astegolimab (also known as [id number] or [id number]) is a fully human, IgG2 monoclonal antibody that binds with high affinity to the interleukin (IL)-33 (IL-33) receptor, ST2, thereby blocking the signaling of IL-33, an inflammatory cytokine of the IL-1 family and member of the "alarmin" class of molecules. Astegolimab binds with high affinity to the human and cynomolgus monkey receptor for IL-33, ST2, and blocks IL-33 binding, thus inhibiting association with the IL-1R accessory protein (AcP) co-receptor and formation of an activated receptor complex.      Integrated Pharmacokinetic/Pharmacodynamic Analysis:  In Studies [id number], [id number], and [id number], exploratory biomarker analysis showed there were consistent decreases in blood eosinophil counts throughout the treatment period, potentially mediated by a direct effect of IL-33 on eosinophil progenitors. In [place], there was no  significant difference in fractional exhaled nitric oxide (FeNO) levels (reflecting airway IL-4/IL-13 activity) between astegolimab-treated groups relative to placebo throughout the treatment period. These data suggest that astegolimab has only a limited effect on Type 2 inflammation in asthma. No data are applicable for Study [id number]. No data are available yet for Study [id number].  Special Warnings/Considerations: Administration of astegolimab, a protein therapeutic, may lead to the development of anti-astegolimab antibodies which could lead to AEs and/or decreased exposure. In non-clinical studies (see Section 4.2), ADA incidence has generally been low and there was no apparent ADA impact on PK and safety in these studies. To date, the immunogenicity rates observed with astegolimab in clinical studies have been relatively low as well (see Immunogenicity Section 5.6). Several clinical studies have been conducted in patients with asthma, atopic dermatitis, COPD (IIS Study [id number]) and COVID-19 severe pneumonia, which generally showed low incidence of ADAs (Table 31). There has been no correlation between ADA status and clinical findings or increased incidence of AEs. Astegolimab is now being considered in a larger study for the treatment of COPD. This patient population is typically considered to have a hyper-responsive immune system. Route of administration for this molecule will be [place], either Q2W or Q4W. These factors increase the risk of development of an immune response to astegolimab, specifically with repeat dosing. From the previous IIS Study [id number], incidence of ADAs was low in COPD patients; this remains to be confirmed in a larger study. To monitor ADA development in ongoing studies, serum samples will be collected from patients at protocol-defined intervals. Patients who test positive for antibodies and have clinical sequelae that are considered potentially related to an ADA response may also be asked  to return for additional follow-up testing.  Drug Interaction Studies: No PK drug interaction studies have been conducted to date.  Serious Adverse Reactions Observed in  Asthma Study: During the treatment period, a similar proportion of patients across all cohorts experienced at least one AE, regardless of causality (Table 22). In total, 77.2%, 70.9%, 72.2%, and 72.1% of patients reported at least 1 AE in the placebo, 70 mg, 210 mg, and 490 mg groups, respectively. The most common AE's (>5% in any treatment group) were asthma, nasopharyngitis, upper respiratory tract infection, headache, and injection site reaction (ISR). The most common drug-related AE was ISR, which was reported more frequently in the astegolimab treatment groups than in the placebo group (1 [0.8%] patient in the placebo group, 10 [7.9%] patients with 70 mg, 8 [6.3%] patients with 210 mg, 6 [4.9%] patients with 490 mg). All ISRs were non-serious and mild or moderate in severity. During the treatment period, 50 SAEs were reported in 37 (7.4%) patients. The number of patients reporting SAEs was comparable across all cohorts (11 SAEs in 8 patients on placebo, 21 SAEs in 14 patients on 70 mg, 11 SAEs in 9 patients on 210 mg, and 7 SAEs in 6 patients on 490 mg). The most common SAE was asthma. One SAE of moderate livedo reticularis (70 mg) was considered a suspected unexpected serious adverse drug reaction (SUSAR) related to astegolimab and was reported two days after the second dose, leading to discontinuation of astegolimab. Two (0.4%) patients reported anaphylaxis and hypersensitivity reactions: 1 severe SAE of anaphylactic reaction (placebo), and 1 moderate hypersensitivity (490 mg) considered related to astegolimab. Three (0.6%) patients experienced a potential Major Adverse Cardiac Event (MACE) (1 patient each in the placebo, 70 mg, and 210 mg groups). None of the potential MACE were considered related to astegolimab. Overall, 233 (46.4%)  patients reported events of infection. A comparable number of patients reported infection across all treatment groups (65 [51.2%] patients on placebo, 55 [43.3%] patients on 70 mg, 58 [46.0%] patients on 210 mg, and 55 [45.1%] patients on 490 mg). The most frequently reported infection (>=10% incidence) was nasopharyngitis (12.7%). One patient on astegolimab 210 mg and the partner of one patient on placebo became pregnant during the study. Both delivered normal/healthy babies. Two deaths, unrelated to study drug, were reported: one patient on 210 mg astegolimab died following an SAE of asthma; the other patient on 490 mg astegolimab had an unexplained death. There were no clinically meaningful changes in laboratory parameters, vital signs, or ECG results, other than the 10% decrease in mean blood eosinophil counts in the astegolimab-treated groups, with no safety concerns. Treatment-induced ADAs were comparable between the astegolimab groups and had no impact on safety. Overall, astegolimab was well tolerated at all doses used and had a safety profile consistent with that observed in the previous astegolimab Phase I studies.  Serious Adverse Reactions Observed in Previous COPD Study: In the completed IIS Study [id number], a total of 81 patients received at least one dose of astegolimab or placebo. The safety profile of astegolimab was similar to that of placebo. There were a total of 222 AEs reported in 62 patients. A total of 28 (72%) patients in the placebo arm and 34 (81%) patients in the astegolimab arm reported at least one AE. The most commonly reported AEs were headache followed by urinary tract infection and viral upper respiratory tract infection. A total of 39 SAEs were reported in 28 (35%) patients; 16 (41%) patients in the placebo group and 12 (29%) in the astegolimab group. The most commonly reported SAE was hospital admission for community acquired pneumonia. Four SAEs resulted in patient  discontinuation from treatment (  1 patient in the placebo group and 3 patients in the astegolimab group). One patient in the placebo group experienced an AESI of potential MACE (heart failure, unrelated to the study treatment). No anaphylaxis or pregnancies were reported. Two deaths, unrelated to study treatment, were reported: 1 patient in the placebo group died after hospital acquired pneumonia, and another patient in the placebo group died after pneumonia and type 2 respiratory failure.  Safety Data: Astegolimab has been generally well tolerated. There have been 49 patient deaths across all astegolimab studies (Section 5.5.2), none of which were considered related to astegolimab. A total of 144 subjects experienced a total of 224 SAEs across all astegolimab studies. Of these, an SAE livedo reticularis observed in 1 patient was considered related to astegolimab by the investigator (Section 5.5.3). AEs leading to withdrawal (Section 5.5.4) have generally occurred at a rate similar to what would be expected for clinical trials in the studies' respective indications.   [facility] [place] Clinical Research Coordinator note :    This visit for Subject [id number] with DOB: 1955/12/13 on 04/22/2023 for the above protocol is Visit # 27  and is for purpose of research.    Subject expressed continued interest and consent in continuing as a study subject. Subject confirmed that there was no change in contact information (e.g. address, telephone, email). Subject thanked for participation in research and contribution to science.    The Subject was informed that the PI Dr. Marchelle Gearing continues to have oversight of the subject's visits and course  through relevant discussions, reviews and also specifically of this visit by routing of this note to the PI.    All procedures and assessments were performed per above stated protocol. Subject tolerated injections well. Please refer to subjects paper source binder for further  details of this visit.   Signed by Laurelyn Sickle Clinical Research Coordinator  [facility]  [place], [place]

## 2023-05-07 ENCOUNTER — Encounter: Payer: Medicare HMO | Admitting: *Deleted

## 2023-05-07 DIAGNOSIS — Z006 Encounter for examination for normal comparison and control in clinical research program: Secondary | ICD-10-CM

## 2023-05-07 DIAGNOSIS — J441 Chronic obstructive pulmonary disease with (acute) exacerbation: Secondary | ICD-10-CM

## 2023-05-07 NOTE — Research (Cosign Needed)
Title: A Phase III, randomized, double-blind, placebo controlled, multicenter study to evaluate the efficacy and safety of astegolimab in patients with chronic obstructive pulmonary disease    Dose and Duration of Treatment: Astegolimab is presented as a sterile, slightly brown-yellow solution. Each single-use, 2.25 mL pre-filled syringe contains 1.7 mL deliverable volume. Astegolimab drug product is formulated at 140 mg/mL astegolimab with 114 mM succinic acid, 200 mM L-arginine, 10 mM L-methionine, 0.06% (w/v) polysorbate 20, pH 5.7. Placebo for astegolimab is supplied in an identical pre-filled syringe configuration.   Protocol # [id number] Protocol: Version 3, dated 26/May2023, Version 4 dated 20Jun2023 IB: version 9.0 dated April 2024 ICF: Main version 12Apr2023, revised 17July2023 Mobile Nursing: 01Sep2023, revised 01Sep2023 Lab Manual: V4.0.0 20Oct202   Mechanism of action: Astegolimab (also known as [id number] or [id number]) is a fully human, IgG2 monoclonal antibody that binds with high affinity to the interleukin (IL)-33 (IL-33) receptor, ST2, thereby blocking the signaling of IL-33, an inflammatory cytokine of the IL-1 family and member of the "alarmin" class of molecules. Astegolimab binds with high affinity to the human and cynomolgus monkey receptor for IL-33, ST2, and blocks IL-33 binding, thus inhibiting association with the IL-1R accessory protein (AcP) co-receptor and formation of an activated receptor complex.    Integrated Pharmacokinetic/Pharmacodynamic Analysis:  In Studies [id number], [id number], and [id number], exploratory biomarker analysis showed there were consistent decreases in blood eosinophil counts throughout the treatment period, potentially mediated by a direct effect of IL-33 on eosinophil progenitors. In [place], there was no significant difference in fractional exhaled nitric oxide (FeNO) levels (reflecting airway IL-4/IL-13 activity) between astegolimab-treated groups  relative to placebo throughout the treatment period. These data suggest that astegolimab has only a limited effect on Type 2 inflammation in asthma. No data are applicable for Study [id number]. No data are available yet for Study [id number].  Special Warnings/Considerations: Administration of astegolimab, a protein therapeutic, may lead to the development of anti-astegolimab antibodies which could lead to AEs and/or decreased exposure. In non-clinical studies (see Section 4.2), ADA incidence has generally been low and there was no apparent ADA impact on PK and safety in these studies. To date, the immunogenicity rates observed with astegolimab in clinical studies have been relatively low as well (see Immunogenicity Section 5.6). Several clinical studies have been conducted in patients with asthma, atopic dermatitis, COPD (IIS Study [id number]) and COVID-19 severe pneumonia, which generally showed low incidence of ADAs (Table 31). There has been no correlation between ADA status and clinical findings or increased incidence of AEs. Astegolimab is now being considered in a larger study for the treatment of COPD. This patient population is typically considered to have a hyper-responsive immune system. Route of administration for this molecule will be [place], either Q2W or Q4W. These factors increase the risk of development of an immune response to astegolimab, specifically with repeat dosing. From the previous IIS Study [id number], incidence of ADAs was low in COPD patients; this remains to be confirmed in a larger study. To monitor ADA development in ongoing studies, serum samples will be collected from patients at protocol-defined intervals. Patients who test positive for antibodies and have clinical sequelae that are considered potentially related to an ADA response may also be asked to return for additional follow-up testing.  Drug Interaction Studies: No PK drug interaction studies have been conducted to date.  Serious  Adverse Reactions Observed in Asthma Study: During the treatment period, a similar proportion of patients across all cohorts experienced at least one AE, regardless of causality (Table  22). In total, 77.2%, 70.9%, 72.2%, and 72.1% of patients reported at least 1 AE in the placebo, 70 mg, 210 mg, and 490 mg groups, respectively. The most common AE's (>5% in any treatment group) were asthma, nasopharyngitis, upper respiratory tract infection, headache, and injection site reaction (ISR). The most common drug-related AE was ISR, which was reported more frequently in the astegolimab treatment groups than in the placebo group (1 [0.8%] patient in the placebo group, 10 [7.9%] patients with 70 mg, 8 [6.3%] patients with 210 mg, 6 [4.9%] patients with 490 mg). All ISRs were non-serious and mild or moderate in severity. During the treatment period, 50 SAEs were reported in 37 (7.4%) patients. The number of patients reporting SAEs was comparable across all cohorts (11 SAEs in 8 patients on placebo, 21 SAEs in 14 patients on 70 mg, 11 SAEs in 9 patients on 210 mg, and 7 SAEs in 6 patients on 490 mg). The most common SAE was asthma. One SAE of moderate livedo reticularis (70 mg) was considered a suspected unexpected serious adverse drug reaction (SUSAR) related to astegolimab and was reported two days after the second dose, leading to discontinuation of astegolimab. Two (0.4%) patients reported anaphylaxis and hypersensitivity reactions: 1 severe SAE of anaphylactic reaction (placebo), and 1 moderate hypersensitivity (490 mg) considered related to astegolimab. Three (0.6%) patients experienced a potential Major Adverse Cardiac Event (MACE) (1 patient each in the placebo, 70 mg, and 210 mg groups). None of the potential MACE were considered related to astegolimab. Overall, 233 (46.4%) patients reported events of infection. A comparable number of patients reported infection across all treatment groups (65 [51.2%] patients on  placebo, 55 [43.3%] patients on 70 mg, 58 [46.0%] patients on 210 mg, and 55 [45.1%] patients on 490 mg). The most frequently reported infection (>=10% incidence) was nasopharyngitis (12.7%). One patient on astegolimab 210 mg and the partner of one patient on placebo became pregnant during the study. Both delivered normal/healthy babies. Two deaths, unrelated to study drug, were reported: one patient on 210 mg astegolimab died following an SAE of asthma; the other patient on 490 mg astegolimab had an unexplained death. There were no clinically meaningful changes in laboratory parameters, vital signs, or ECG results, other than the 10% decrease in mean blood eosinophil counts in the astegolimab-treated groups, with no safety concerns. Treatment-induced ADAs were comparable between the astegolimab groups and had no impact on safety. Overall, astegolimab was well tolerated at all doses used and had a safety profile consistent with that observed in the previous astegolimab Phase I studies.  Serious Adverse Reactions Observed in Previous COPD Study: In the completed IIS Study [id number], a total of 81 patients received at least one dose of astegolimab or placebo. The safety profile of astegolimab was similar to that of placebo. There were a total of 222 AEs reported in 62 patients. A total of 28 (72%) patients in the placebo arm and 34 (81%) patients in the astegolimab arm reported at least one AE. The most commonly reported AEs were headache followed by urinary tract infection and viral upper respiratory tract infection. A total of 39 SAEs were reported in 28 (35%) patients; 16 (41%) patients in the placebo group and 12 (29%) in the astegolimab group. The most commonly reported SAE was hospital admission for community acquired pneumonia. Four SAEs resulted in patient discontinuation from treatment (1 patient in the placebo group and 3 patients in the astegolimab group). One patient in the placebo group experienced an  AESI of  potential MACE (heart failure, unrelated to the study treatment). No anaphylaxis or pregnancies were reported. Two deaths, unrelated to study treatment, were reported: 1 patient in the placebo group died after hospital acquired pneumonia, and another patient in the placebo group died after pneumonia and type 2 respiratory failure.  Safety Data: Astegolimab has been generally well tolerated. There have been 49 patient deaths across all astegolimab studies (Section 5.5.2), none of which were considered related to astegolimab. A total of 144 subjects experienced a total of 224 SAEs across all astegolimab studies. Of these, an SAE livedo reticularis observed in 1 patient was considered related to astegolimab by the investigator (Section 5.5.3). AEs leading to withdrawal (Section 5.5.4) have generally occurred at a rate similar to what would be expected for clinical trials in the studies' respective indications.   [facility] [place] Clinical Research Coordinator note :    This visit for Subject [id number] with DOB: 1955-10-20 on 07 May 2023 for the above protocol is Visit/Encounter #  and is for purpose of research.    The consent for this encounter is under Protocol Version 2.0 , Investigator Brochure Version 7, Consent Version IRB Approved  revised  and is currently IRB approved.    Subject expressed continued interest and consent in continuing as a study subject. Subject confirmed that there was no change in contact information (e.g. address, telephone, email). Subject thanked for participation in research and contribution to science.  In this visit 52 the subject will be evaluated by Erven Colla research coordinator has verified that the above investigator is up to date with his/her training logs.    The Subject was informed that the PI Dr. Marchelle Gearing continues to have oversight of the subject's visits and course  through relevant discussions, reviews and also specifically of this visit  by routing of this note to the PI.  [facility] [place] Clinical Research Coordinator note:   This visit for Subject Katie Greene with DOB: 1955/08/22 on 05/07/2023 for the above protocol is Visit/Encounter # 52  and is for purpose of research.   Subject expressed continued interest and consent in continuing as a study subject. Subject confirmed that there was  no  change in contact information (e.g. address, telephone, email). Subject thanked for participation in research and contribution to science. In this visit 05/07/2023 the subject will be evaluated by Sub-Investigator) named Oren Bracket. This research coordinator has verified that the above investigator is yes, up to date with his/her training logs.   The Subject was yes, informed that the PI Kalman Shan, MD continues to have oversight of the subject's visits and course through relevant discussions, reviews, and also specifically of this visit by routing of this note to the PI.   Signed by  Laurelyn Sickle  Clinical Research Coordinator  [facility]  [place], [place] 1:15 PM 05/07/2023

## 2023-05-08 NOTE — Progress Notes (Signed)
Katie Greene,DOB  [birth year],was seen as subject in a clinical trial /Protocol # [id number] Cardiopulmonary symptoms are stable as Stage 2.  She describes exertional dyspnea without need for increase in O2 flow above baseline. Other or new symptoms denied . Pertinent physical findings include: Rhythm is slightly irregular.  She has 1+ edema of the left lower extremity and 1.5+ edema of the right lower extremity in the context of not having taken her diuretic (furosemide) this morning.   Coarse rales are noted in the lower third of the lungs posteriorly , right greater than left.  Subtle clubbing of the nailbeds is suggested.  She has scattered bruising of the upper extremities, left greater than right. All physical findings NCS                                                                     Pecola Lawless MD,SI

## 2023-05-10 DIAGNOSIS — H25813 Combined forms of age-related cataract, bilateral: Secondary | ICD-10-CM | POA: Diagnosis not present

## 2023-05-15 DIAGNOSIS — Z23 Encounter for immunization: Secondary | ICD-10-CM | POA: Diagnosis not present

## 2023-05-20 ENCOUNTER — Encounter: Payer: Medicare HMO | Admitting: *Deleted

## 2023-05-20 ENCOUNTER — Encounter: Payer: Medicare HMO | Admitting: Internal Medicine

## 2023-05-20 DIAGNOSIS — J441 Chronic obstructive pulmonary disease with (acute) exacerbation: Secondary | ICD-10-CM

## 2023-05-20 DIAGNOSIS — J449 Chronic obstructive pulmonary disease, unspecified: Secondary | ICD-10-CM

## 2023-05-20 DIAGNOSIS — J440 Chronic obstructive pulmonary disease with acute lower respiratory infection: Secondary | ICD-10-CM

## 2023-05-20 MED ORDER — STUDY - ARNASA GB43374 (OPEN-LABEL) - ASTEGOLIMAB 238 MG/1.7 ML SQ INJECTION (PI-RAMASWAMY)
476.0000 mg | INJECTION | SUBCUTANEOUS | Status: DC
  Administered 2023-05-20: 476 mg via SUBCUTANEOUS
  Filled 2023-05-20: qty 3.4

## 2023-05-20 NOTE — Research (Cosign Needed Addendum)
Title: Title: A phase III Open-Label extension study to evaluate the long-term safety of Astegolimab in patiens with Chronic Obstructive Pulmonary disease.   Dose and Duration of Treatment: Astegolimab is presented as a sterile, slightly brown-yellow solution. Each single-use, 2.25 mL pre-filled syringe contains 1.7 mL deliverable volume. Astegolimab drug product is formulated at 140 mg/mL astegolimab with 114 mM succinic acid, 200 mM L-arginine, 10 mM L-methionine, 0.06% (w/v) polysorbate 20, pH 5.7. Placebo for astegolimab is supplied in an identical pre-filled syringe configuration.   Protocol # [id number] Protocol Version: v2 dated 03Apr2023 IB: version 9.0 dated April 2024 ICF:  Main ICF: [employer] IR[facility] Approved Version 01 Dec 2021, Revised 26 Mar 2023 Mobile Nursing: [employer] Approved Version 28 May 2022, Revised 11 Mar 2023 Lab Manual: Version 2.1.0 dated 07Mar2023 Key Inclusion Criteria: Able and willing to provide written informed consent and to comply with the study protocol. Ability to comply with the requirements of study protocol, according to the investigators's best judgement. Completion of the 52-week treatment period in either [place] or Study [id number].  Key Exclusion Criteria: Significant non- compliance in the parent study, specifically defined as missing scheduled visits, per investigators judgement. Any new clinically significant pulmonary disease other than COPD since enrolling in the study. Unstable cardiac disease, myocardial infarction, or New York Heart Association Class III or IV heart failure since enrolling in the parent study.     Mechanism of action: Astegolimab (also known as [id number] or [id number]) is a fully human, IgG2 monoclonal antibody that binds with high affinity to the interleukin (IL)-33 (IL-33) receptor, ST2, thereby blocking the signaling of IL-33, an inflammatory cytokine of the IL-1 family and member of the "alarmin" class of molecules.  Astegolimab binds with high affinity to the human and cynomolgus monkey receptor for IL-33, ST2, and blocks IL-33 binding, thus inhibiting association with the IL-1R accessory protein (AcP) co-receptor and formation of an activated receptor complex.     Special Warnings/Considerations: Administration of astegolimab, a protein therapeutic, may lead to the development of anti-astegolimab antibodies which could lead to AEs and/or decreased exposure. In non-clinical studies (see Section 4.2), ADA incidence has generally been low and there was no apparent ADA impact on PK and safety in these studies. To date, the immunogenicity rates observed with astegolimab in clinical studies have been relatively low as well (see Immunogenicity Section 5.6). Several clinical studies have been conducted in patients with asthma, atopic dermatitis, COPD (IIS Study [id number]) and COVID-19 severe pneumonia, which generally showed low incidence of ADAs (Table 31). There has been no correlation between ADA status and clinical findings or increased incidence of AEs. Astegolimab is now being considered in a larger study for the treatment of COPD. This patient population is typically considered to have a hyper-responsive immune system. Route of administration for this molecule will be [place], either Q2W or Q4W. These factors increase the risk of development of an immune response to astegolimab, specifically with repeat dosing. From the previous IIS Study [id number], incidence of ADAs was low in COPD patients; this remains to be confirmed in a larger study. To monitor ADA development in ongoing studies, serum samples will be collected from patients at protocol-defined intervals. Patients who test positive for antibodies and have clinical sequelae that are considered potentially related to an ADA response may also be asked to return for additional follow-up testing.  Drug Interaction Studies: No PK drug interaction studies have been conducted to  date.  Serious Adverse Reactions Observed in Asthma Study: During the treatment  period, a similar proportion of patients across all cohorts experienced at least one AE, regardless of causality (Table 22). In total, 77.2%, 70.9%, 72.2%, and 72.1% of patients reported at least 1 AE in the placebo, 70 mg, 210 mg, and 490 mg groups, respectively. The most common AE's (>5% in any treatment group) were asthma, nasopharyngitis, upper respiratory tract infection, headache, and injection site reaction (ISR). The most common drug-related AE was ISR, which was reported more frequently in the astegolimab treatment groups than in the placebo group (1 [0.8%] patient in the placebo group, 10 [7.9%] patients with 70 mg, 8 [6.3%] patients with 210 mg, 6 [4.9%] patients with 490 mg). All ISRs were non-serious and mild or moderate in severity. During the treatment period, 50 SAEs were reported in 37 (7.4%) patients. The number of patients reporting SAEs was comparable across all cohorts (11 SAEs in 8 patients on placebo, 21 SAEs in 14 patients on 70 mg, 11 SAEs in 9 patients on 210 mg, and 7 SAEs in 6 patients on 490 mg). The most common SAE was asthma. One SAE of moderate livedo reticularis (70 mg) was considered a suspected unexpected serious adverse drug reaction (SUSAR) related to astegolimab and was reported two days after the second dose, leading to discontinuation of astegolimab. Two (0.4%) patients reported anaphylaxis and hypersensitivity reactions: 1 severe SAE of anaphylactic reaction (placebo), and 1 moderate hypersensitivity (490 mg) considered related to astegolimab. Three (0.6%) patients experienced a potential Major Adverse Cardiac Event (MACE) (1 patient each in the placebo, 70 mg, and 210 mg groups). None of the potential MACE were considered related to astegolimab. Overall, 233 (46.4%) patients reported events of infection. A comparable number of patients reported infection across all treatment groups (65 [51.2%]  patients on placebo, 55 [43.3%] patients on 70 mg, 58 [46.0%] patients on 210 mg, and 55 [45.1%] patients on 490 mg). The most frequently reported infection (>=10% incidence) was nasopharyngitis (12.7%). One patient on astegolimab 210 mg and the partner of one patient on placebo became pregnant during the study. Both delivered normal/healthy babies. Two deaths, unrelated to study drug, were reported: one patient on 210 mg astegolimab died following an SAE of asthma; the other patient on 490 mg astegolimab had an unexplained death. There were no clinically meaningful changes in laboratory parameters, vital signs, or ECG results, other than the 10% decrease in mean blood eosinophil counts in the astegolimab-treated groups, with no safety concerns. Treatment-induced ADAs were comparable between the astegolimab groups and had no impact on safety. Overall, astegolimab was well tolerated at all doses used and had a safety profile consistent with that observed in the previous astegolimab Phase I studies.  Serious Adverse Reactions Observed in Previous COPD Study: In the completed IIS Study [id number], a total of 81 patients received at least one dose of astegolimab or placebo. The safety profile of astegolimab was similar to that of placebo. There were a total of 222 AEs reported in 62 patients. A total of 28 (72%) patients in the placebo arm and 34 (81%) patients in the astegolimab arm reported at least one AE. The most commonly reported AEs were headache followed by urinary tract infection and viral upper respiratory tract infection. A total of 39 SAEs were reported in 28 (35%) patients; 16 (41%) patients in the placebo group and 12 (29%) in the astegolimab group. The most commonly reported SAE was hospital admission for community acquired pneumonia. Four SAEs resulted in patient discontinuation from treatment (1 patient in the placebo group  and 3 patients in the astegolimab group). One patient in the placebo group  experienced an AESI of potential MACE (heart failure, unrelated to the study treatment). No anaphylaxis or pregnancies were reported. Two deaths, unrelated to study treatment, were reported: 1 patient in the placebo group died after hospital acquired pneumonia, and another patient in the placebo group died after pneumonia and type 2 respiratory failure.  Safety Data: Astegolimab has been generally well tolerated. There have been 49 patient deaths across all astegolimab studies (Section 5.5.2), none of which were considered related to astegolimab. A total of 144 subjects experienced a total of 224 SAEs across all astegolimab studies. Of these, an SAE livedo reticularis observed in 1 patient was considered related to astegolimab by the investigator (Section 5.5.3). AEs leading to withdrawal (Section 5.5.4) have generally occurred at a rate similar to what would be expected for clinical trials in the studies' respective indications.   [facility] [place] Clinical Research Coordinator note :    This visit for Subject [id number] with DOB: 28-Apr-1956 on 20 May 2023 for the above protocol is Visit/Encounter #  1 and is for purpose of research.    Subject expressed continued interest and consent in continuing as a study subject. Subject confirmed that there was no change in contact information (e.g. address, telephone, email). Subject thanked for participation in research and contribution to science.  In this visit 1 the subject will be evaluated by Carmin Muskrat Ramaswamy,MD This research coordinator has verified that the above investigator is up to date with his/her training logs.    The Subject was informed that the PI Dr. Marchelle Gearing continues to have oversight of the subject's visits and course  through relevant discussions, reviews and also specifically of this visit by routing of this note to the PI.    Signed by Laurelyn Sickle Clinical Research Coordinator  [facility]  [place], [place]

## 2023-05-24 DIAGNOSIS — H25813 Combined forms of age-related cataract, bilateral: Secondary | ICD-10-CM | POA: Diagnosis not present

## 2023-05-27 ENCOUNTER — Telehealth: Payer: Self-pay | Admitting: Internal Medicine

## 2023-05-27 NOTE — Telephone Encounter (Signed)
[employer] is calling for a form to be signed and faxed back by [facility] for the patient to have cataract surgery.

## 2023-05-27 NOTE — Telephone Encounter (Signed)
Fax received from Dr. Nelva Nay with [facility]  to perform a Cataract extraction by PE, IOL- Left then right under IV sedation  on patient.  Patient needs surgery clearance. Surgery is 06/08/23 and then 07/09/23 for right eye. Patient was seen on 01/2022. Office protocol is a risk assessment can be sent to surgeon if patient has been seen in 60 days or less.   Pt has been seen sooner for research visit. MR- do you feel comfortable doing risk assessment? Unfortunately there are no openings with any provider sooner than end of Dec 2024. Her procedure is set for 06/18/23.

## 2023-05-28 ENCOUNTER — Telehealth: Payer: Self-pay

## 2023-05-28 NOTE — Telephone Encounter (Signed)
..     Pre-operative Risk Assessment    Patient Name: Katie Greene  DOB: 19-Sep-1955 MRN: [id number]      Request for Surgical Clearance    Procedure:   CATARACT EXTRACTION BY PE, IOL LEFT THEN RIGHT  Date of Surgery:  Clearance 06/18/23                                 Surgeon: DR. Burlene Arnt [facility] Surgeon's Group or Practice Name:  [facility] Phone number:  [phone] Fax number:  [phone]   Type of Clearance Requested:   - Medical    Type of Anesthesia:   IV SEDATION   Additional requests/questions:   NEW PATIENT  Signed, Renee Ramus   05/28/2023, 3:01 PM

## 2023-05-28 NOTE — Telephone Encounter (Signed)
Patient has never been seen by [facility].  Request to call back team to determine if [facility] request that patient establish care.

## 2023-05-28 NOTE — Telephone Encounter (Signed)
I left a message for the surgery scheduler to call our office back to let us know about establishing care.

## 2023-05-31 NOTE — Telephone Encounter (Signed)
S/w Lurena Joiner with [facility] in regard to if they were referring pt to cardiology. I informed Lurena Joiner that we have never seen the pt before. She tells me that the pt talked about Dr. Jacinto Halim. I have reviewed the chart a couple of times and I do not see Dr. Verl Dicker name anywhere. Lurena Joiner said she will investigate further and will let me know if the pt is going to need a NEW PT APPT FOR PRE OP CLEARANCE.

## 2023-05-31 NOTE — Telephone Encounter (Signed)
Katie Knapp PA from [facility] checking on surgical clearance. Katie Greene phone number is [phone]. May leave detailed message on voicemail.

## 2023-06-01 NOTE — Telephone Encounter (Signed)
Diona with [employer] states "please disregard clearance. Clearance was sent over to PCP and if they think she needs to be cleared by Cardiology then they will send over a clearance but we are not requesting clearance from Cardiology."

## 2023-06-01 NOTE — Telephone Encounter (Signed)
I will update the pre op APP today to please see notes from requesting office and to disregard the request that they sent.

## 2023-06-03 ENCOUNTER — Encounter: Payer: Medicare HMO | Admitting: *Deleted

## 2023-06-03 DIAGNOSIS — J449 Chronic obstructive pulmonary disease, unspecified: Secondary | ICD-10-CM

## 2023-06-03 MED ORDER — STUDY - ARNASA GB43374 (OPEN-LABEL) - ASTEGOLIMAB 238 MG/1.7 ML SQ INJECTION (PI-RAMASWAMY)
476.0000 mg | INJECTION | SUBCUTANEOUS | Status: DC
  Administered 2023-06-03: 476 mg via SUBCUTANEOUS
  Filled 2023-06-03: qty 3.4

## 2023-06-03 NOTE — Research (Cosign Needed)
Title: A phase III Open-Label extension study to evaluate the long-term safety of Astegolimab in patiens with Chronic Obstructive Pulmonary disease.   Dose and Duration of Treatment: Astegolimab is presented as a sterile, slightly brown-yellow solution. Each single-use, 2.25 mL pre-filled syringe contains 1.7 mL deliverable volume. Astegolimab drug product is formulated at 140 mg/mL astegolimab with 114 mM succinic acid, 200 mM L-arginine, 10 mM L-methionine, 0.06% (w/v) polysorbate 20, pH 5.7. Placebo for astegolimab is supplied in an identical pre-filled syringe configuration.   Protocol # [id number] Sponsor: F. [employer] [address], [place]   Designer, fashion/clothing Product: Astegolimab [phone]) Protocol Version: v2 dated 03Apr2023 IB: version 9.0 dated April 2024 ICF:  Main ICF: [employer] IR[facility] Approved Version 01 Dec 2021, Revised 26 Mar 2023 Mobile Nursing: [employer] IR[employer] Approved Version 28 May 2022, Revised 11 Mar 2023 Lab Manual: Version 2.1.0 dated 07Mar2023    Mechanism of action: Astegolimab (also known as [id number] or [id number]) is a fully human, IgG2 monoclonal antibody that binds with high affinity to the interleukin (IL)-33 (IL-33) receptor, ST2, thereby blocking the signaling of IL-33, an inflammatory cytokine of the IL-1 family and member of the "alarmin" class of molecules. Astegolimab binds with high affinity to the human and cynomolgus monkey receptor for IL-33, ST2, and blocks IL-33 binding, thus inhibiting association with the IL-1R accessory protein (AcP) co-receptor and formation of an activated receptor complex.   Key Inclusion Criteria: Able and willing to provide written informed consent and to comply with the study protocol. Ability to comply with the requirements of study protocol, according to the investigators's best judgement. Completion of the 52-week treatment period in either [place] or Study [id number].  Key Exclusion  Criteria: Significant non- compliance in the parent study, specifically defined as missing scheduled visits, per investigators judgement. Any new clinically significant pulmonary disease other than COPD since enrolling in the study. Unstable cardiac disease, myocardial infarction, or New York Heart Association Class III or IV heart failure since enrolling in the parent study.      Integrated Pharmacokinetic/Pharmacodynamic Analysis:  In Studies [id number], [id number], and [id number], exploratory biomarker analysis showed there were consistent decreases in blood eosinophil counts throughout the treatment period, potentially mediated by a direct effect of IL-33 on eosinophil progenitors. In [place], there was no significant difference in fractional exhaled nitric oxide (FeNO) levels (reflecting airway IL-4/IL-13 activity) between astegolimab-treated groups relative to placebo throughout the treatment period. These data suggest that astegolimab has only a limited effect on Type 2 inflammation in asthma. No data are applicable for Study [id number]. No data are available yet for Study [id number].  Special Warnings/Considerations: Administration of astegolimab, a protein therapeutic, may lead to the development of anti-astegolimab antibodies which could lead to AEs and/or decreased exposure. In non-clinical studies (see Section 4.2), ADA incidence has generally been low and there was no apparent ADA impact on PK and safety in these studies. To date, the immunogenicity rates observed with astegolimab in clinical studies have been relatively low as well (see Immunogenicity Section 5.6). Several clinical studies have been conducted in patients with asthma, atopic dermatitis, COPD (IIS Study DG64403) and COVID-19 severe pneumonia, which generally showed low incidence of ADAs (Table 31). There has been no correlation between ADA status and clinical findings or increased incidence of AEs. Astegolimab is now being considered in  a larger study for the treatment of COPD. This patient population is typically considered to have a hyper-responsive immune system. Route of administration for this molecule will  be Piru, either Q2W or Q4W. These factors increase the risk of development of an immune response to astegolimab, specifically with repeat dosing. From the previous IIS Study [id number], incidence of ADAs was low in COPD patients; this remains to be confirmed in a larger study. To monitor ADA development in ongoing studies, serum samples will be collected from patients at protocol-defined intervals. Patients who test positive for antibodies and have clinical sequelae that are considered potentially related to an ADA response may also be asked to return for additional follow-up testing.  Drug Interaction Studies: No PK drug interaction studies have been conducted to date.  Serious Adverse Reactions Observed in Asthma Study: During the treatment period, a similar proportion of patients across all cohorts experienced at least one AE, regardless of causality (Table 22). In total, 77.2%, 70.9%, 72.2%, and 72.1% of patients reported at least 1 AE in the placebo, 70 mg, 210 mg, and 490 mg groups, respectively. The most common AE's (>5% in any treatment group) were asthma, nasopharyngitis, upper respiratory tract infection, headache, and injection site reaction (ISR). The most common drug-related AE was ISR, which was reported more frequently in the astegolimab treatment groups than in the placebo group (1 [0.8%] patient in the placebo group, 10 [7.9%] patients with 70 mg, 8 [6.3%] patients with 210 mg, 6 [4.9%] patients with 490 mg). All ISRs were non-serious and mild or moderate in severity. During the treatment period, 50 SAEs were reported in 37 (7.4%) patients. The number of patients reporting SAEs was comparable across all cohorts (11 SAEs in 8 patients on placebo, 21 SAEs in 14 patients on 70 mg, 11 SAEs in 9 patients on 210 mg, and 7 SAEs in  6 patients on 490 mg). The most common SAE was asthma. One SAE of moderate livedo reticularis (70 mg) was considered a suspected unexpected serious adverse drug reaction (SUSAR) related to astegolimab and was reported two days after the second dose, leading to discontinuation of astegolimab. Two (0.4%) patients reported anaphylaxis and hypersensitivity reactions: 1 severe SAE of anaphylactic reaction (placebo), and 1 moderate hypersensitivity (490 mg) considered related to astegolimab. Three (0.6%) patients experienced a potential Major Adverse Cardiac Event (MACE) (1 patient each in the placebo, 70 mg, and 210 mg groups). None of the potential MACE were considered related to astegolimab. Overall, 233 (46.4%) patients reported events of infection. A comparable number of patients reported infection across all treatment groups (65 [51.2%] patients on placebo, 55 [43.3%] patients on 70 mg, 58 [46.0%] patients on 210 mg, and 55 [45.1%] patients on 490 mg). The most frequently reported infection (>=10% incidence) was nasopharyngitis (12.7%). One patient on astegolimab 210 mg and the partner of one patient on placebo became pregnant during the study. Both delivered normal/healthy babies. Two deaths, unrelated to study drug, were reported: one patient on 210 mg astegolimab died following an SAE of asthma; the other patient on 490 mg astegolimab had an unexplained death. There were no clinically meaningful changes in laboratory parameters, vital signs, or ECG results, other than the 10% decrease in mean blood eosinophil counts in the astegolimab-treated groups, with no safety concerns. Treatment-induced ADAs were comparable between the astegolimab groups and had no impact on safety. Overall, astegolimab was well tolerated at all doses used and had a safety profile consistent with that observed in the previous astegolimab Phase I studies.  Serious Adverse Reactions Observed in Previous COPD Study: In the completed IIS  Study [id number], a total of 81 patients received at least one dose  of astegolimab or placebo. The safety profile of astegolimab was similar to that of placebo. There were a total of 222 AEs reported in 62 patients. A total of 28 (72%) patients in the placebo arm and 34 (81%) patients in the astegolimab arm reported at least one AE. The most commonly reported AEs were headache followed by urinary tract infection and viral upper respiratory tract infection. A total of 39 SAEs were reported in 28 (35%) patients; 16 (41%) patients in the placebo group and 12 (29%) in the astegolimab group. The most commonly reported SAE was hospital admission for community acquired pneumonia. Four SAEs resulted in patient discontinuation from treatment (1 patient in the placebo group and 3 patients in the astegolimab group). One patient in the placebo group experienced an AESI of potential MACE (heart failure, unrelated to the study treatment). No anaphylaxis or pregnancies were reported. Two deaths, unrelated to study treatment, were reported: 1 patient in the placebo group died after hospital acquired pneumonia, and another patient in the placebo group died after pneumonia and type 2 respiratory failure.  Safety Data: Astegolimab has been generally well tolerated. There have been 49 patient deaths across all astegolimab studies (Section 5.5.2), none of which were considered related to astegolimab. A total of 144 subjects experienced a total of 224 SAEs across all astegolimab studies. Of these, an SAE livedo reticularis observed in 1 patient was considered related to astegolimab by the investigator (Section 5.5.3). AEs leading to withdrawal (Section 5.5.4) have generally occurred at a rate similar to what would be expected for clinical trials in the studies' respective indications.   [facility] [place] Clinical Research Coordinator note :    This visit for Subject [id number] with DOB: [birth year] on 03 Jun 2023 for the above  protocol is Visit/Encounter #  and is for purpose of research.    The consent for this encounter is under Protocol Version 2.0 , Investigator Brochure Version 7, Consent Version IRB Approved  revised  and is currently IRB approved.    Subject expressed continued interest and consent in continuing as a study subject. Subject confirmed that there was no change in contact information (e.g. address, telephone, email). Subject thanked for participation in research and contribution to science.  The Subject was informed that the PI Dr. Marchelle Gearing continues to have oversight of the subject's visits and course  through relevant discussions, reviews and also specifically of this visit by routing of this note to the PI.   All procedures and assessments were performed per above stated protocol. Subject tolerated injections well. Please refer to subjects paper source binder for further details of this visit.    Signed by Laurelyn Sickle Clinical Research Coordinator  [facility]  [place], [place]

## 2023-06-07 NOTE — Telephone Encounter (Signed)
I have faxed a copy of this note to [facility] ([phone])  I called them at [phone] ext [phone] and left detailed msg to call me back so I can provide number for MR and make sure they receive my fax.   Will await a call back.

## 2023-06-07 NOTE — Telephone Encounter (Signed)
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  She will not be able to handle IV sedation without airway protection.  This procedure should be done in the hospital.  If she gets intubated for the procedure there is a chance she may not be able to come off the ventilator.  The ideal is to just do under local anesthesia if they can do this.  -Please have the ophthalmologist call me on my cell phone.  Please feel free to share my cell phone number with the ophthalmologist so I can get better handle on the surgery being done

## 2023-06-10 NOTE — Telephone Encounter (Signed)
[facility] calling back sayingf they have no rcvd it PLS RE Valinda Hoar [phone]

## 2023-06-14 NOTE — Telephone Encounter (Signed)
Morrie Sheldon calling from [facility] checking on surgical clearance. Morrie Sheldon phone number is [phone]. Fax number is [phone].

## 2023-06-14 NOTE — Telephone Encounter (Signed)
I refaxed this note to Morrie Sheldon- [phone]   I spoke with her and let her know this was done   She will call back if needed   Closing encounter

## 2023-06-17 ENCOUNTER — Encounter: Payer: Medicare HMO | Admitting: *Deleted

## 2023-06-17 ENCOUNTER — Encounter: Payer: Medicare HMO | Admitting: Internal Medicine

## 2023-06-17 ENCOUNTER — Telehealth: Payer: Self-pay | Admitting: Internal Medicine

## 2023-06-17 DIAGNOSIS — J441 Chronic obstructive pulmonary disease with (acute) exacerbation: Secondary | ICD-10-CM

## 2023-06-17 DIAGNOSIS — J449 Chronic obstructive pulmonary disease, unspecified: Secondary | ICD-10-CM

## 2023-06-17 MED ORDER — AZITHROMYCIN 250 MG PO TABS: 250.0000 mg | ORAL_TABLET | ORAL | 0 refills | Status: DC

## 2023-06-17 MED ORDER — PREDNISONE 10 MG PO TABS: ORAL_TABLET | ORAL | 0 refills | Status: DC

## 2023-06-17 MED ORDER — STUDY - ARNASA GB43374 (OPEN-LABEL) - ASTEGOLIMAB 238 MG/1.7 ML SQ INJECTION (PI-RAMASWAMY)
476.0000 mg | INJECTION | SUBCUTANEOUS | Status: DC
  Administered 2023-06-17: 476 mg via SUBCUTANEOUS
  Filled 2023-06-17: qty 3.4

## 2023-06-17 NOTE — Progress Notes (Signed)
Title: A phase III Open-Label extension study to evaluate the long-term safety of Astegolimab in patiens with Chronic Obstructive Pulmonary disease.   Dose and Duration of Treatment: Astegolimab is presented as a sterile, slightly brown-yellow solution. Each single-use, 2.25 mL pre-filled syringe contains 1.7 mL deliverable volume. Astegolimab drug product is formulated at 140 mg/mL astegolimab with 114 mM succinic acid, 200 mM L-arginine, 10 mM L-methionine, 0.06% (w/v) polysorbate 20, pH 5.7. Placebo for astegolimab is supplied in an identical pre-filled syringe configuration.   Protocol # [id number] Sponsor: F. [employer] [address] [place]   Designer, fashion/clothing Product: Astegolimab [phone]) Protocol Version: v2 dated 03Apr2023 IB: version 9.0 dated April 2024 ICF:  Main ICF: [employer] IRB Approved Version 01 Dec 2021, Revised 26 Mar 2023 Mobile Nursing: [employer] IR[employer] Approved Version 28 May 2022, Revised 11 Mar 2023 Lab Manual: Version 2.1.0 dated 07Mar2023    Mechanism of action: Astegolimab (also known as [id number] or [id number]) is a fully human, IgG2 monoclonal antibody that binds with high affinity to the interleukin (IL)-33 (IL-33) receptor, ST2, thereby blocking the signaling of IL-33, an inflammatory cytokine of the IL-1 family and member of the "alarmin" class of molecules. Astegolimab binds with high affinity to the human and cynomolgus monkey receptor for IL-33, ST2, and blocks IL-33 binding, thus inhibiting association with the IL-1R accessory protein (AcP) co-receptor and formation of an activated receptor complex.   Key Inclusion Criteria: Able and willing to provide written informed consent and to comply with the study protocol. Ability to comply with the requirements of study protocol, according to the investigators's best judgement. Completion of the 52-week treatment period in either [place] or Study [id number].  Key Exclusion  Criteria: Significant non- compliance in the parent study, specifically defined as missing scheduled visits, per investigators judgement. Any new clinically significant pulmonary disease other than COPD since enrolling in the study. Unstable cardiac disease, myocardial infarction, or New York Heart Association Class III or IV heart failure since enrolling in the parent study.     Xxxxxxxxxxxxxxxxxxx  She made a visit for routine injection.  During this time coordinator noticed that patient had complaints.  Therefore I did unscheduled physical exam.  She indicated for the last 1 week she is having increased cough and wheezing but no productive sputum no fever no chills no hemoptysis no edema no orthopnea no paroxysmal nocturnal dyspnea.  She feels she is in a COPD exacerbation because of the fall leaves and change in weather.   Physical exam done and documented in the source document   Assessment COPD exacerbation Recent study subject   Plan  - Z-Pak - Prednisone 5-day    SIGNATURE    Dr. Kalman Shan, M.D., F.C.C.P, ACRP-CPI Pulmonary and Critical Care Medicine Research Investigator, [facility] Staff Physician, [facility] Director - Interstitial Lung Disease  Program  Pulmonary Fibrosis [facility] - [place] [facility] and [facility] @ [facility] [place], [place], [address]   Pager: [phone], If no answer  OR between  19:00-7:00h: page [phone] Telephone (research): [phone]  5:42 PM 06/17/2023   5:42 PM 06/17/2023

## 2023-06-17 NOTE — Telephone Encounter (Signed)
Triage  Katie Greene   Is having increaesd cough and wheexing x 1 week. Likely due to fall leaves and dry weather. No fever. No sputum. No chills  A AECOPD  Plan  -z pak - Please take prednisone 40 mg x1 day, then 30 mg x1 day, then 20 mg x1 day, then 10 mg x1 day, and then 5 mg x1 day and stop  [facility]  Allergies  Allergen Reactions   Daliresp [Roflumilast] Diarrhea, Nausea And Vomiting and Other (See Comments)    Upset stomach      SIGNATURE    Dr. Kalman Shan, M.D., F.C.C.P,  Pulmonary and Critical Care Medicine Staff Physician, [facility] Center Director - Interstitial Lung Disease  Program  Pulmonary Fibrosis [facility] Network at [facility] [place], [place], [address]   Pager: [phone], If no answer  -> Check AMION or Try [phone] Telephone (clinical office): [phone] Telephone (research): [phone]  11:30 AM 06/17/2023

## 2023-06-17 NOTE — Telephone Encounter (Signed)
I have sent rxs to her preferred pharm- pred taper and zpack

## 2023-06-17 NOTE — Research (Cosign Needed)
Title: A phase III Open-Label extension study to evaluate the long-term safety of Astegolimab in patiens with Chronic Obstructive Pulmonary disease.   Dose and Duration of Treatment: Astegolimab is presented as a sterile, slightly brown-yellow solution. Each single-use, 2.25 mL pre-filled syringe contains 1.7 mL deliverable volume. Astegolimab drug product is formulated at 140 mg/mL astegolimab with 114 mM succinic acid, 200 mM L-arginine, 10 mM L-methionine, 0.06% (w/v) polysorbate 20, pH 5.7. Placebo for astegolimab is supplied in an identical pre-filled syringe configuration.   Protocol # [id number] Sponsor: F. [employer] [address], [place]   [facility] Astronomer note:   This visit for Subject Katie Greene with DOB: [birth year] on 06/17/2023 for the above protocol is Visit/Encounter # Week 4  and is for purpose of research.   The consent for this encounter is under Protocol Version: v2 dated 03Apr2023 IB: version 9.0 dated April 2024 ICF: Main ICF: [employer] IRB Approved Version 01 Dec 2021, Revised 26 Mar 2023  is currently IRB approved.   Subject expressed continued interest and consent in continuing as a study subject. Subject confirmed that there was  no  change in contact information (e.g. address, telephone, email). Subject thanked for participation in research and contribution to science. The Subject was  informed that the PI Dr. Kalman Shan continues to have oversight of the subject's visits and course through relevant discussions, reviews, and also specifically of this visit by routing of this note to the PI.  Subject stated she has been having wheezing at night and during the day that is worse over the last week She also states increased dry cough x 1 week. A physical exam is not included in the Week 4 visit, so an Unscheduled Exam was completed by Dr. Marchelle Gearing to evaluate the subjects symptoms.   Per PI, the  subject is having an acute COPD exacerbation and he has prescribed zpak and pred taper for treatment. Ok to proceed with study drug injection as planned.   Injections given, subject tolerated well without complaints.   Refer to the subjects paper source binder for further details of the visit.    Signed by  Carron Curie, CMA, BS, [facility]  Clinical Research Coordinator / Nurse [place], [place] 12:19 PM 06/17/2023

## 2023-06-17 NOTE — Patient Instructions (Signed)
Per protocol 

## 2023-06-19 ENCOUNTER — Other Ambulatory Visit: Payer: Self-pay | Admitting: Internal Medicine

## 2023-06-21 ENCOUNTER — Ambulatory Visit (HOSPITAL_COMMUNITY)
Admission: RE | Admit: 2023-06-21 | Discharge: 2023-06-21 | Disposition: A | Discharge status: Home / Self Care | Payer: Medicare HMO | Source: Ambulatory Visit | Attending: Acute Care | Admitting: Acute Care

## 2023-06-21 ENCOUNTER — Encounter (HOSPITAL_COMMUNITY): Payer: Self-pay

## 2023-06-21 DIAGNOSIS — Z87891 Personal history of nicotine dependence: Secondary | ICD-10-CM | POA: Insufficient documentation

## 2023-06-21 DIAGNOSIS — Z122 Encounter for screening for malignant neoplasm of respiratory organs: Secondary | ICD-10-CM | POA: Insufficient documentation

## 2023-06-28 ENCOUNTER — Other Ambulatory Visit: Payer: Self-pay | Admitting: Internal Medicine

## 2023-06-30 ENCOUNTER — Telehealth: Payer: Self-pay | Admitting: *Deleted

## 2023-06-30 ENCOUNTER — Other Ambulatory Visit: Payer: Self-pay | Admitting: Internal Medicine

## 2023-06-30 ENCOUNTER — Encounter: Payer: Medicare HMO | Admitting: *Deleted

## 2023-06-30 DIAGNOSIS — J449 Chronic obstructive pulmonary disease, unspecified: Secondary | ICD-10-CM

## 2023-06-30 MED ORDER — DOXYCYCLINE HYCLATE 100 MG PO TABS: 100.0000 mg | ORAL_TABLET | Freq: Two times a day (BID) | ORAL | 0 refills | Status: DC

## 2023-06-30 MED ORDER — PREDNISONE 10 MG PO TABS: ORAL_TABLET | ORAL | 0 refills | Status: DC

## 2023-06-30 MED ORDER — STUDY - ARNASA GB43374 (OPEN-LABEL) - ASTEGOLIMAB 238 MG/1.7 ML SQ INJECTION (PI-RAMASWAMY)
476.0000 mg | INJECTION | SUBCUTANEOUS | Status: DC
  Administered 2023-06-30: 476 mg via SUBCUTANEOUS
  Filled 2023-06-30: qty 3.4

## 2023-06-30 NOTE — Research (Cosign Needed)
Title: A phase III Open-Label extension study to evaluate the long-term safety of Astegolimab in patiens with Chronic Obstructive Pulmonary disease.   Dose and Duration of Treatment: Astegolimab is presented as a sterile, slightly brown-yellow solution. Each single-use, 2.25 mL pre-filled syringe contains 1.7 mL deliverable volume. Astegolimab drug product is formulated at 140 mg/mL astegolimab with 114 mM succinic acid, 200 mM L-arginine, 10 mM L-methionine, 0.06% (w/v) polysorbate 20, pH 5.7. Placebo for astegolimab is supplied in an identical pre-filled syringe configuration.   Protocol # [id number] Sponsor: F. [employer]- [employer] [id number], [place]   Designer, fashion/clothing Product: Astegolimab [phone]) Protocol Version: v2 dated 03Apr2023 IB: version 9.0 dated April 2024 ICF:  Main ICF: [employer] [facility] Approved Version 01 Dec 2021, Revised 26 Mar 2023 Mobile Nursing: [employer] Approved Version 28 May 2022, Revised 11 Mar 2023 Lab Manual: Version 2.1.0 dated 07Mar2023    Mechanism of action: Astegolimab (also known as [id number] or [id number]) is a fully human, IgG2 monoclonal antibody that binds with high affinity to the interleukin (IL)-33 (IL-33) receptor, ST2, thereby blocking the signaling of IL-33, an inflammatory cytokine of the IL-1 family and member of the "alarmin" class of molecules. Astegolimab binds with high affinity to the human and cynomolgus monkey receptor for IL-33, ST2, and blocks IL-33 binding, thus inhibiting association with the IL-1R accessory protein (AcP) co-receptor and formation of an activated receptor complex.   Key Inclusion Criteria: Able and willing to provide written informed consent and to comply with the study protocol. Ability to comply with the requirements of study protocol, according to the investigators's best judgement. Completion of the 52-week treatment period in either [place] or Study [id number].  Key Exclusion  Criteria: Significant non- compliance in the parent study, specifically defined as missing scheduled visits, per investigators judgement. Any new clinically significant pulmonary disease other than COPD since enrolling in the study. Unstable cardiac disease, myocardial infarction, or New York Heart Association Class III or IV heart failure since enrolling in the parent study.      Integrated Pharmacokinetic/Pharmacodynamic Analysis:  In Studies [id number], [id number], and [id number], exploratory biomarker analysis showed there were consistent decreases in blood eosinophil counts throughout the treatment period, potentially mediated by a direct effect of IL-33 on eosinophil progenitors. In [place], there was no significant difference in fractional exhaled nitric oxide (FeNO) levels (reflecting airway IL-4/IL-13 activity) between astegolimab-treated groups relative to placebo throughout the treatment period. These data suggest that astegolimab has only a limited effect on Type 2 inflammation in asthma. No data are applicable for Study [id number]. No data are available yet for Study [id number].  Special Warnings/Considerations: Administration of astegolimab, a protein therapeutic, may lead to the development of anti-astegolimab antibodies which could lead to AEs and/or decreased exposure. In non-clinical studies (see Section 4.2), ADA incidence has generally been low and there was no apparent ADA impact on PK and safety in these studies. To date, the immunogenicity rates observed with astegolimab in clinical studies have been relatively low as well (see Immunogenicity Section 5.6). Several clinical studies have been conducted in patients with asthma, atopic dermatitis, COPD (IIS Study [id number]) and COVID-19 severe pneumonia, which generally showed low incidence of ADAs (Table 31). There has been no correlation between ADA status and clinical findings or increased incidence of AEs. Astegolimab is now being considered in  a larger study for the treatment of COPD. This patient population is typically considered to have a hyper-responsive immune system. Route of administration for this molecule will  be [place], either Q2W or Q4W. These factors increase the risk of development of an immune response to astegolimab, specifically with repeat dosing. From the previous IIS Study [id number], incidence of ADAs was low in COPD patients; this remains to be confirmed in a larger study. To monitor ADA development in ongoing studies, serum samples will be collected from patients at protocol-defined intervals. Patients who test positive for antibodies and have clinical sequelae that are considered potentially related to an ADA response may also be asked to return for additional follow-up testing.  Drug Interaction Studies: No PK drug interaction studies have been conducted to date.  Serious Adverse Reactions Observed in Asthma Study: During the treatment period, a similar proportion of patients across all cohorts experienced at least one AE, regardless of causality (Table 22). In total, 77.2%, 70.9%, 72.2%, and 72.1% of patients reported at least 1 AE in the placebo, 70 mg, 210 mg, and 490 mg groups, respectively. The most common AE's (>5% in any treatment group) were asthma, nasopharyngitis, upper respiratory tract infection, headache, and injection site reaction (ISR). The most common drug-related AE was ISR, which was reported more frequently in the astegolimab treatment groups than in the placebo group (1 [0.8%] patient in the placebo group, 10 [7.9%] patients with 70 mg, 8 [6.3%] patients with 210 mg, 6 [4.9%] patients with 490 mg). All ISRs were non-serious and mild or moderate in severity. During the treatment period, 50 SAEs were reported in 37 (7.4%) patients. The number of patients reporting SAEs was comparable across all cohorts (11 SAEs in 8 patients on placebo, 21 SAEs in 14 patients on 70 mg, 11 SAEs in 9 patients on 210 mg, and 7 SAEs in  6 patients on 490 mg). The most common SAE was asthma. One SAE of moderate livedo reticularis (70 mg) was considered a suspected unexpected serious adverse drug reaction (SUSAR) related to astegolimab and was reported two days after the second dose, leading to discontinuation of astegolimab. Two (0.4%) patients reported anaphylaxis and hypersensitivity reactions: 1 severe SAE of anaphylactic reaction (placebo), and 1 moderate hypersensitivity (490 mg) considered related to astegolimab. Three (0.6%) patients experienced a potential Major Adverse Cardiac Event (MACE) (1 patient each in the placebo, 70 mg, and 210 mg groups). None of the potential MACE were considered related to astegolimab. Overall, 233 (46.4%) patients reported events of infection. A comparable number of patients reported infection across all treatment groups (65 [51.2%] patients on placebo, 55 [43.3%] patients on 70 mg, 58 [46.0%] patients on 210 mg, and 55 [45.1%] patients on 490 mg). The most frequently reported infection (>=10% incidence) was nasopharyngitis (12.7%). One patient on astegolimab 210 mg and the partner of one patient on placebo became pregnant during the study. Both delivered normal/healthy babies. Two deaths, unrelated to study drug, were reported: one patient on 210 mg astegolimab died following an SAE of asthma; the other patient on 490 mg astegolimab had an unexplained death. There were no clinically meaningful changes in laboratory parameters, vital signs, or ECG results, other than the 10% decrease in mean blood eosinophil counts in the astegolimab-treated groups, with no safety concerns. Treatment-induced ADAs were comparable between the astegolimab groups and had no impact on safety. Overall, astegolimab was well tolerated at all doses used and had a safety profile consistent with that observed in the previous astegolimab Phase I studies.  Serious Adverse Reactions Observed in Previous COPD Study: In the completed IIS  Study [id number], a total of 81 patients received at least one dose  of astegolimab or placebo. The safety profile of astegolimab was similar to that of placebo. There were a total of 222 AEs reported in 62 patients. A total of 28 (72%) patients in the placebo arm and 34 (81%) patients in the astegolimab arm reported at least one AE. The most commonly reported AEs were headache followed by urinary tract infection and viral upper respiratory tract infection. A total of 39 SAEs were reported in 28 (35%) patients; 16 (41%) patients in the placebo group and 12 (29%) in the astegolimab group. The most commonly reported SAE was hospital admission for community acquired pneumonia. Four SAEs resulted in patient discontinuation from treatment (1 patient in the placebo group and 3 patients in the astegolimab group). One patient in the placebo group experienced an AESI of potential MACE (heart failure, unrelated to the study treatment). No anaphylaxis or pregnancies were reported. Two deaths, unrelated to study treatment, were reported: 1 patient in the placebo group died after hospital acquired pneumonia, and another patient in the placebo group died after pneumonia and type 2 respiratory failure.  Safety Data: Astegolimab has been generally well tolerated. There have been 49 patient deaths across all astegolimab studies (Section 5.5.2), none of which were considered related to astegolimab. A total of 144 subjects experienced a total of 224 SAEs across all astegolimab studies. Of these, an SAE livedo reticularis observed in 1 patient was considered related to astegolimab by the investigator (Section 5.5.3). AEs leading to withdrawal (Section 5.5.4) have generally occurred at a rate similar to what would be expected for clinical trials in the studies' respective indications.   [facility] [place] Clinical Research Coordinator note :    This visit for Subject [id number] with DOB: 10/30/1955 on 30 Jun 2023 for the above  protocol is Visit/Encounter #   week 6 and is for purpose of research.      Subject expressed continued interest and consent in continuing as a study subject. Subject confirmed that there was no change in contact information (e.g. address, telephone, email). Subject thanked for participation in research and contribution to science.  I  The Subject was informed that the PI Dr. Marchelle Gearing continues to have oversight of the subject's visits and course  through relevant discussions, reviews and also specifically of this visit by routing of this note to the PI. All assessments and procedures were performed per above stated protocol. Subject tolerated injections well. Please refer to subject paper source binder for further details of this visit.        Signed by Laurelyn Sickle Clinical Research Coordinator  [facility]  [place], [place]

## 2023-06-30 NOTE — Telephone Encounter (Signed)
Pt seen 06/30/23 for research visit- still not recovered from recent COPD flare- per MR called in pred taper and doxy 100  bid x 5 days. Rxs sent to preferred pharm. Jerolyn Shin has made pt aware.

## 2023-07-13 ENCOUNTER — Other Ambulatory Visit: Payer: Self-pay

## 2023-07-13 DIAGNOSIS — Z87891 Personal history of nicotine dependence: Secondary | ICD-10-CM

## 2023-07-13 DIAGNOSIS — Z122 Encounter for screening for malignant neoplasm of respiratory organs: Secondary | ICD-10-CM

## 2023-07-14 ENCOUNTER — Encounter: Payer: Medicare HMO | Admitting: *Deleted

## 2023-07-14 DIAGNOSIS — J449 Chronic obstructive pulmonary disease, unspecified: Secondary | ICD-10-CM

## 2023-07-14 MED ORDER — STUDY - ARNASA GB43374 (OPEN-LABEL) - ASTEGOLIMAB 238 MG/1.7 ML SQ INJECTION (PI-RAMASWAMY)
476.0000 mg | INJECTION | SUBCUTANEOUS | Status: DC
  Administered 2023-07-14: 476 mg via SUBCUTANEOUS
  Filled 2023-07-14: qty 3.4

## 2023-07-14 NOTE — Research (Signed)
Title: A phase III Open-Label extension study to evaluate the long-term safety of Astegolimab in patiens with Chronic Obstructive Pulmonary disease.   Dose and Duration of Treatment: Astegolimab is presented as a sterile, slightly brown-yellow solution. Each single-use, 2.25 mL pre-filled syringe contains 1.7 mL deliverable volume. Astegolimab drug product is formulated at 140 mg/mL astegolimab with 114 mM succinic acid, 200 mM L-arginine, 10 mM L-methionine, 0.06% (w/v) polysorbate 20, pH 5.7. Placebo for astegolimab is supplied in an identical pre-filled syringe configuration.   Protocol # [id number] Sponsor: F. Mikey [employer]- [employer] [address], [place]   Designer, fashion/clothing Product: Astegolimab [phone]) Protocol Version: v2 dated 03Apr2023 IB: version 9.0 dated April 2024 ICF:  Main ICF: [facility] Approved Version 01 Dec 2021, Revised 26 Mar 2023 Mobile Nursing: [facility] Approved Version 26Oct2023, Revised 08Aug2024 Lab Manual: Version 2.1.0 dated 07Mar2023   Mechanism of action: Astegolimab (also known as [id number] or [id number]) is a fully human, IgG2 monoclonal antibody that binds with high affinity to the interleukin (IL)-33 (IL-33) receptor, ST2, thereby blocking the signaling of IL-33, an inflammatory cytokine of the IL-1 family and member of the "alarmin" class of molecules. Astegolimab binds with high affinity to the human and cynomolgus monkey receptor for IL-33, ST2, and blocks IL-33 binding, thus inhibiting association with the IL-1R accessory protein (AcP) co-receptor and formation of an activated receptor complex.   Key Inclusion Criteria: Able and willing to provide written informed consent and to comply with the study protocol. Ability to comply with the requirements of study protocol, according to the investigators's best judgement. Completion of the 52-week treatment period in either [place] or Study [id number].  Key Exclusion  Criteria: Significant non- compliance in the parent study, specifically defined as missing scheduled visits, per investigators judgement. Any new clinically significant pulmonary disease other than COPD since enrolling in the study. Unstable cardiac disease, myocardial infarction, or New York Heart Association Class III or IV heart failure since enrolling in the parent study.      Integrated Pharmacokinetic/Pharmacodynamic Analysis:  In Studies [id number], [id number], and [id number], exploratory biomarker analysis showed there were consistent decreases in blood eosinophil counts throughout the treatment period, potentially mediated by a direct effect of IL-33 on eosinophil progenitors. In [place], there was no significant difference in fractional exhaled nitric oxide (FeNO) levels (reflecting airway IL-4/IL-13 activity) between astegolimab-treated groups relative to placebo throughout the treatment period. These data suggest that astegolimab has only a limited effect on Type 2 inflammation in asthma. No data are applicable for Study [id number]. No data are available yet for Study [id number].  Special Warnings/Considerations: Administration of astegolimab, a protein therapeutic, may lead to the development of anti-astegolimab antibodies which could lead to AEs and/or decreased exposure. In non-clinical studies (see Section 4.2), ADA incidence has generally been low and there was no apparent ADA impact on PK and safety in these studies. To date, the immunogenicity rates observed with astegolimab in clinical studies have been relatively low as well (see Immunogenicity Section 5.6). Several clinical studies have been conducted in patients with asthma, atopic dermatitis, COPD (IIS Study IR51884) and COVID-19 severe pneumonia, which generally showed low incidence of ADAs (Table 31). There has been no correlation between ADA status and clinical findings or increased incidence of AEs. Astegolimab is now being considered in  a larger study for the treatment of COPD. This patient population is typically considered to have a hyper-responsive immune system. Route of administration for this molecule will be [place], either Q2W or  Q4W. These factors increase the risk of development of an immune response to astegolimab, specifically with repeat dosing. From the previous IIS Study [id number], incidence of ADAs was low in COPD patients; this remains to be confirmed in a larger study. To monitor ADA development in ongoing studies, serum samples will be collected from patients at protocol-defined intervals. Patients who test positive for antibodies and have clinical sequelae that are considered potentially related to an ADA response may also be asked to return for additional follow-up testing.  Drug Interaction Studies: No PK drug interaction studies have been conducted to date.  Serious Adverse Reactions Observed in Asthma Study: During the treatment period, a similar proportion of patients across all cohorts experienced at least one AE, regardless of causality (Table 22). In total, 77.2%, 70.9%, 72.2%, and 72.1% of patients reported at least 1 AE in the placebo, 70 mg, 210 mg, and 490 mg groups, respectively. The most common AE's (>5% in any treatment group) were asthma, nasopharyngitis, upper respiratory tract infection, headache, and injection site reaction (ISR). The most common drug-related AE was ISR, which was reported more frequently in the astegolimab treatment groups than in the placebo group (1 [0.8%] patient in the placebo group, 10 [7.9%] patients with 70 mg, 8 [6.3%] patients with 210 mg, 6 [4.9%] patients with 490 mg). All ISRs were non-serious and mild or moderate in severity. During the treatment period, 50 SAEs were reported in 37 (7.4%) patients. The number of patients reporting SAEs was comparable across all cohorts (11 SAEs in 8 patients on placebo, 21 SAEs in 14 patients on 70 mg, 11 SAEs in 9 patients on 210 mg, and 7 SAEs in  6 patients on 490 mg). The most common SAE was asthma. One SAE of moderate livedo reticularis (70 mg) was considered a suspected unexpected serious adverse drug reaction (SUSAR) related to astegolimab and was reported two days after the second dose, leading to discontinuation of astegolimab. Two (0.4%) patients reported anaphylaxis and hypersensitivity reactions: 1 severe SAE of anaphylactic reaction (placebo), and 1 moderate hypersensitivity (490 mg) considered related to astegolimab. Three (0.6%) patients experienced a potential Major Adverse Cardiac Event (MACE) (1 patient each in the placebo, 70 mg, and 210 mg groups). None of the potential MACE were considered related to astegolimab. Overall, 233 (46.4%) patients reported events of infection. A comparable number of patients reported infection across all treatment groups (65 [51.2%] patients on placebo, 55 [43.3%] patients on 70 mg, 58 [46.0%] patients on 210 mg, and 55 [45.1%] patients on 490 mg). The most frequently reported infection (>=10% incidence) was nasopharyngitis (12.7%). One patient on astegolimab 210 mg and the partner of one patient on placebo became pregnant during the study. Both delivered normal/healthy babies. Two deaths, unrelated to study drug, were reported: one patient on 210 mg astegolimab died following an SAE of asthma; the other patient on 490 mg astegolimab had an unexplained death. There were no clinically meaningful changes in laboratory parameters, vital signs, or ECG results, other than the 10% decrease in mean blood eosinophil counts in the astegolimab-treated groups, with no safety concerns. Treatment-induced ADAs were comparable between the astegolimab groups and had no impact on safety. Overall, astegolimab was well tolerated at all doses used and had a safety profile consistent with that observed in the previous astegolimab Phase I studies.  Serious Adverse Reactions Observed in Previous COPD Study: In the completed IIS  Study [id number], a total of 81 patients received at least one dose of astegolimab or placebo. The  safety profile of astegolimab was similar to that of placebo. There were a total of 222 AEs reported in 62 patients. A total of 28 (72%) patients in the placebo arm and 34 (81%) patients in the astegolimab arm reported at least one AE. The most commonly reported AEs were headache followed by urinary tract infection and viral upper respiratory tract infection. A total of 39 SAEs were reported in 28 (35%) patients; 16 (41%) patients in the placebo group and 12 (29%) in the astegolimab group. The most commonly reported SAE was hospital admission for community acquired pneumonia. Four SAEs resulted in patient discontinuation from treatment (1 patient in the placebo group and 3 patients in the astegolimab group). One patient in the placebo group experienced an AESI of potential MACE (heart failure, unrelated to the study treatment). No anaphylaxis or pregnancies were reported. Two deaths, unrelated to study treatment, were reported: 1 patient in the placebo group died after hospital acquired pneumonia, and another patient in the placebo group died after pneumonia and type 2 respiratory failure.  Safety Data: Astegolimab has been generally well tolerated. There have been 49 patient deaths across all astegolimab studies (Section 5.5.2), none of which were considered related to astegolimab. A total of 144 subjects experienced a total of 224 SAEs across all astegolimab studies. Of these, an SAE livedo reticularis observed in 1 patient was considered related to astegolimab by the investigator (Section 5.5.3). AEs leading to withdrawal (Section 5.5.4) have generally occurred at a rate similar to what would be expected for clinical trials in the studies' respective indications.   [facility] [place] Clinical Research Coordinator note :    This visit for Subject [id number] with DOB: January 21, 1956 on 14 Jul 2023 for the above  protocol is Visit/Encounter #  week 8 and is for purpose of research.    The consent for this encounter is under Protocol Version 2.0 , Investigator Brochure Version 7, Consent Version IRB Approved  revised  and is currently IRB approved.    Subject expressed continued interest and consent in continuing as a study subject. Subject confirmed that there was no change in contact information (e.g. address, telephone, email). Subject thanked for participation in research and contribution to science.    The Subject was informed that the PI Dr. Marchelle Gearing continues to have oversight of the subject's visits and course  through relevant discussions, reviews and also specifically of this visit by routing of this note to the PI.  All assessments and procedures were performed per above stated protocol. Subject tolerated injections well. Please refer to subjects paper source binder for further details of this visit.     Signed by Laurelyn Sickle Clinical Research Coordinator  [facility]  [place], [place]

## 2023-07-26 ENCOUNTER — Encounter: Payer: Medicare HMO | Admitting: *Deleted

## 2023-07-26 DIAGNOSIS — J449 Chronic obstructive pulmonary disease, unspecified: Secondary | ICD-10-CM

## 2023-07-26 MED ORDER — STUDY - ARNASA GB43374 (OPEN-LABEL) - ASTEGOLIMAB 238 MG/1.7 ML SQ INJECTION (PI-RAMASWAMY)
476.0000 mg | INJECTION | SUBCUTANEOUS | Status: DC
  Administered 2023-07-26: 476 mg via SUBCUTANEOUS
  Filled 2023-07-26: qty 3.4

## 2023-07-26 NOTE — Research (Cosign Needed)
Title: A phase III Open-Label extension study to evaluate the long-term safety of Astegolimab in patiens with Chronic Obstructive Pulmonary disease.   Dose and Duration of Treatment: Astegolimab is presented as a sterile, slightly brown-yellow solution. Each single-use, 2.25 mL pre-filled syringe contains 1.7 mL deliverable volume. Astegolimab drug product is formulated at 140 mg/mL astegolimab with 114 mM succinic acid, 200 mM L-arginine, 10 mM L-methionine, 0.06% (w/v) polysorbate 20, pH 5.7. Placebo for astegolimab is supplied in an identical pre-filled syringe configuration.   Protocol # [id number] Sponsor: [employer] [address] [place]   [facility] Astronomer note:   This visit for Subject Katie Greene with DOB: 1956-02-24 on 07/26/2023 for the above protocol is Visit/Encounter # Week 10  and is for purpose of research.   The consent for this encounter is under Protocol Version : v3 dated 15Oct2024 IB: version 9.0 dated April 2024 ICF:  ICFs: Make sure that the subject is re-consented with the updated ICFs Main ICF: [facility] Approved Version 01 Jul 2023, Revised 28Nov2024 Mobile Nursing: [employer] [facility] Approved Version 26Oct2023, Revised 08Aug2024 currently IRB approved.   Subject expressed continued interest and consent in continuing as a study subject. Subject confirmed that there was no change in contact information (e.g. address, telephone, email). Subject thanked for participation in research and contribution to science.  The Subject was informed that the PI Dr. Kalman Shan continues to have oversight of the subject's visits and course through relevant discussions, reviews, and also specifically of this visit by routing of this note to the PI.  Subject was reconsented to the main ICF per requirements. Subject also consented to the Informed consent form Addendum 1. Subject declined participation in the optional  mobile nursing and the optional Home study treatment. Refer to the subjects binder for more details on the consent/reconsent process. Sub-I, Dr. Marga Melnick participated in the reconsent process.   Subject tolerated injection well without complaints.   Signed by Carron Curie, CMA, BS, [facility] Clinical Research Coordinator [place], [place] 12:57 PM 07/26/2023

## 2023-07-28 NOTE — Progress Notes (Signed)
[place] ,DOB 1956-01-29,was seen for reconsent  of Phase III Open-Label Extension of Astegolamab in COPD  clinical trial /Protocol #[id number]. Cardiopulmonary symptoms are stable Other or new symptoms denied . All queries answered. No exam performed.                                                                     Pecola Lawless MD,SI

## 2023-08-10 ENCOUNTER — Encounter: Payer: Medicare HMO | Admitting: *Deleted

## 2023-08-10 DIAGNOSIS — J449 Chronic obstructive pulmonary disease, unspecified: Secondary | ICD-10-CM

## 2023-08-10 MED ORDER — STUDY - ARNASA GB43374 (OPEN-LABEL) - ASTEGOLIMAB 238 MG/1.7 ML SQ INJECTION (PI-RAMASWAMY)
476.0000 mg | INJECTION | SUBCUTANEOUS | Status: DC
  Administered 2023-08-10: 476 mg via SUBCUTANEOUS
  Filled 2023-08-10: qty 3.4

## 2023-08-10 NOTE — Research (Signed)
 Title: A phase III Open-Label extension study to evaluate the long-term safety of Astegolimab in patiens with Chronic Obstructive Pulmonary disease.   Dose and Duration of Treatment: Astegolimab is presented as a sterile, slightly brown-yellow solution. Each single-use, 2.25 mL pre-filled syringe contains 1.7 mL deliverable volume. Astegolimab drug product is formulated at 140 mg/mL astegolimab with 114 mM succinic acid, 200 mM L-arginine, 10 mM L-methionine, 0.06% (w/v) polysorbate 20, pH 5.7. Placebo for astegolimab is supplied in an identical pre-filled syringe configuration.   Protocol # [id number] Sponsor: [employer] [address], [place]   Personnel Officer: Astegolimab [phone]) Exam , Post [place], Labs  Protocol Version: v3 dated 15Oct2024 IB: version 9.0 dated April 2024 ICF:  ICFs: Make sure that the subject is re-consented with the updated ICFs Main ICF: [facility] IR[facility] Approved Version 01 Jul 2023, Revised 28Nov2024 Mobile Nursing: [facility] IR[facility] Approved Version 26Oct2023, Revised 08Aug2024 Optional Home Study Treatment: IRB Approved Version 28 May 2022 Revised 11 Mar 2023 ADDENDUM 1: IRB Approved Version 24Jul2024 Revised 8Aug2024 Lab Manual: Version 2.1.0 dated 07Mar2023   Mechanism of action: Astegolimab (also known as [id number] or [id number]) is a fully human, IgG2 monoclonal antibody that binds with high affinity to the interleukin (IL)-33 (IL-33) receptor, ST2, thereby blocking the signaling of IL-33, an inflammatory cytokine of the IL-1 family and member of the alarmin class of molecules. Astegolimab binds with high affinity to the human and cynomolgus monkey receptor for IL-33, ST2, and blocks IL-33 binding, thus inhibiting association with the IL-1R accessory protein (AcP) co-receptor and formation of an activated receptor complex.   Key Inclusion Criteria: Able and willing to provide written informed consent and to comply with  the study protocol. Ability to comply with the requirements of study protocol, according to the investigators's best judgement. Completion of the 52-week treatment period in either [place] or Study HA55667.  Key Exclusion Criteria: Significant non- compliance in the parent study, specifically defined as missing scheduled visits, per investigators judgement. Any new clinically significant pulmonary disease other than COPD since enrolling in the study. Unstable cardiac disease, myocardial infarction, or [place]  Heart Association Class III or IV heart failure since enrolling in the parent study.      Integrated Pharmacokinetic/Pharmacodynamic Analysis:  In Studies [id number], [id number], and [id number], exploratory biomarker analysis showed there were consistent decreases in blood eosinophil counts throughout the treatment period, potentially mediated by a direct effect of IL-33 on eosinophil progenitors. In [place], there was no significant difference in fractional exhaled nitric oxide (FeNO) levels (reflecting airway IL-4/IL-13 activity) between astegolimab-treated groups relative to placebo throughout the treatment period. These data suggest that astegolimab has only a limited effect on Type 2 inflammation in asthma. No data are applicable for Study [id number]. No data are available yet for Study HA56688.  Special Warnings/Considerations: Administration of astegolimab, a protein therapeutic, may lead to the development of anti-astegolimab antibodies which could lead to AEs and/or decreased exposure. In non-clinical studies (see Section 4.2), ADA incidence has generally been low and there was no apparent ADA impact on PK and safety in these studies. To date, the immunogenicity rates observed with astegolimab in clinical studies have been relatively low as well (see Immunogenicity Section 5.6). Several clinical studies have been conducted in patients with asthma, atopic dermatitis, COPD (IIS Study  HA59431) and COVID-19 severe pneumonia, which generally showed low incidence of ADAs (Table 31). There has been no correlation between ADA status and clinical findings or increased incidence of AEs.  Astegolimab is now being considered in a larger study for the treatment of COPD. This patient population is typically considered to have a hyper-responsive immune system. Route of administration for this molecule will be [place], either Q2W or Q4W. These factors increase the risk of development of an immune response to astegolimab, specifically with repeat dosing. From the previous IIS Study HA59431, incidence of ADAs was low in COPD patients; this remains to be confirmed in a larger study. To monitor ADA development in ongoing studies, serum samples will be collected from patients at protocol-defined intervals. Patients who test positive for antibodies and have clinical sequelae that are considered potentially related to an ADA response may also be asked to return for additional follow-up testing.  Drug Interaction Studies: No PK drug interaction studies have been conducted to date.  Serious Adverse Reactions Observed in Asthma Study: During the treatment period, a similar proportion of patients across all cohorts experienced at least one AE, regardless of causality (Table 22). In total, 77.2%, 70.9%, 72.2%, and 72.1% of patients reported at least 1 AE in the placebo, 70 mg, 210 mg, and 490 mg groups, respectively. The most common AE's (>5% in any treatment group) were asthma, nasopharyngitis, upper respiratory tract infection, headache, and injection site reaction (ISR). The most common drug-related AE was ISR, which was reported more frequently in the astegolimab treatment groups than in the placebo group (1 [0.8%] patient in the placebo group, 10 [7.9%] patients with 70 mg, 8 [6.3%] patients with 210 mg, 6 [4.9%] patients with 490 mg). All ISRs were non-serious and mild or moderate in severity. During the treatment  period, 50 SAEs were reported in 37 (7.4%) patients. The number of patients reporting SAEs was comparable across all cohorts (11 SAEs in 8 patients on placebo, 21 SAEs in 14 patients on 70 mg, 11 SAEs in 9 patients on 210 mg, and 7 SAEs in 6 patients on 490 mg). The most common SAE was asthma. One SAE of moderate livedo reticularis (70 mg) was considered a suspected unexpected serious adverse drug reaction (SUSAR) related to astegolimab and was reported two days after the second dose, leading to discontinuation of astegolimab. Two (0.4%) patients reported anaphylaxis and hypersensitivity reactions: 1 severe SAE of anaphylactic reaction (placebo), and 1 moderate hypersensitivity (490 mg) considered related to astegolimab. Three (0.6%) patients experienced a potential Major Adverse Cardiac Event (MACE) (1 patient each in the placebo, 70 mg, and 210 mg groups). None of the potential MACE were considered related to astegolimab. Overall, 233 (46.4%) patients reported events of infection. A comparable number of patients reported infection across all treatment groups (65 [51.2%] patients on placebo, 55 [43.3%] patients on 70 mg, 58 [46.0%] patients on 210 mg, and 55 [45.1%] patients on 490 mg). The most frequently reported infection (>=10% incidence) was nasopharyngitis (12.7%). One patient on astegolimab 210 mg and the partner of one patient on placebo became pregnant during the study. Both delivered normal/healthy babies. Two deaths, unrelated to study drug, were reported: one patient on 210 mg astegolimab died following an SAE of asthma; the other patient on 490 mg astegolimab had an unexplained death. There were no clinically meaningful changes in laboratory parameters, vital signs, or ECG results, other than the 10% decrease in mean blood eosinophil counts in the astegolimab-treated groups, with no safety concerns. Treatment-induced ADAs were comparable between the astegolimab groups and had no impact on safety.  Overall, astegolimab was well tolerated at all doses used and had a safety profile consistent with that  observed in the previous astegolimab Phase I studies.  Serious Adverse Reactions Observed in Previous COPD Study: In the completed IIS Study HA59431, a total of 81 patients received at least one dose of astegolimab or placebo. The safety profile of astegolimab was similar to that of placebo. There were a total of 222 AEs reported in 62 patients. A total of 28 (72%) patients in the placebo arm and 34 (81%) patients in the astegolimab arm reported at least one AE. The most commonly reported AEs were headache followed by urinary tract infection and viral upper respiratory tract infection. A total of 39 SAEs were reported in 28 (35%) patients; 16 (41%) patients in the placebo group and 12 (29%) in the astegolimab group. The most commonly reported SAE was hospital admission for community acquired pneumonia. Four SAEs resulted in patient discontinuation from treatment (1 patient in the placebo group and 3 patients in the astegolimab group). One patient in the placebo group experienced an AESI of potential MACE (heart failure, unrelated to the study treatment). No anaphylaxis or pregnancies were reported. Two deaths, unrelated to study treatment, were reported: 1 patient in the placebo group died after hospital acquired pneumonia, and another patient in the placebo group died after pneumonia and type 2 respiratory failure.  Safety Data: Astegolimab has been generally well tolerated. There have been 49 patient deaths across all astegolimab studies (Section 5.5.2), none of which were considered related to astegolimab. A total of 144 subjects experienced a total of 224 SAEs across all astegolimab studies. Of these, an SAE livedo reticularis observed in 1 patient was considered related to astegolimab by the investigator (Section 5.5.3). AEs leading to withdrawal (Section 5.5.4) have generally occurred at a rate similar  to what would be expected for clinical trials in the studies' respective indications.   [facility] [place] Clinical Research Coordinator note :    This visit for Subject [id number] with DOB: [birth year] on 10 Aug 2023 for the above protocol is Visit/Encounter # week 12 and is for purpose of research.    TSubject expressed continued interest and consent in continuing as a study subject. Subject confirmed that there was no change in contact information (e.g. address, telephone, email). Subject thanked for participation in research and contribution to science.  In this visit week 12 the subject will be evaluated by Katie Greene Starring research coordinator has verified that the above investigator is up to date with his/her training logs.    The Subject was informed that the PI Dr. Geronimo continues to have oversight of the subject's visits and course  through relevant discussions, reviews and also specifically of this visit by routing of this note to the PI. [facility] [place] Clinical Research Coordinator note:   This visit for Subject Katie Greene with DOB: 1956-04-13 on 08/10/2023 for the above protocol is Visit/Encounter # week 12  and is for purpose of research.   Subject expressed continued interest and consent in continuing as a study subject. Subject confirmed that there was  no  change in contact information (e.g. address, telephone, email). Subject thanked for participation in research and contribution to science. In this visit 08/10/2023 the subject will be evaluated by Sub-Investigator) named Katie Hopper,MD. This research coordinator has verified that the above investigator is yes up to date with his/her training logs.   The Subject was yes  informed that the PI Katie Ramaswamy,MD continues to have oversight of the subject's visits and course through relevant discussions, reviews, and also  specifically of this visit by routing of this note to the PI.  All procedures and assessments  were performed per above stated protocol. Subject tolerated injections well. Please refer to subjects paper source binder for further details of this visit.     Signed by  Katie Greene  Clinical Research Coordinator / Nurse Katie Greene, [place] 2:15 PM 08/10/2023

## 2023-08-10 NOTE — Progress Notes (Signed)
 Katie Greene,DOB  11/27/1953,was seen as subject in a clinical trial /Protocol #[id number] Cardiopulmonary symptoms are stable. Other or new symptoms denied. Pertinent physical findings include: Slight accentuated S1; minor rales @ lung bases;subtle clubbing of nailbeds;1 + edema @ sock line. All physical findings NCS.                                                                     Katie JULIANNA Roses MD,SI

## 2023-08-26 ENCOUNTER — Encounter: Payer: Medicare HMO | Admitting: *Deleted

## 2023-08-26 DIAGNOSIS — J449 Chronic obstructive pulmonary disease, unspecified: Secondary | ICD-10-CM

## 2023-08-26 MED ORDER — STUDY - ARNASA GB43374 (OPEN-LABEL) - ASTEGOLIMAB 238 MG/1.7 ML SQ INJECTION (PI-RAMASWAMY)
476.0000 mg | INJECTION | SUBCUTANEOUS | Status: DC
  Administered 2023-08-26: 476 mg via SUBCUTANEOUS
  Filled 2023-08-26: qty 3.4

## 2023-08-26 NOTE — Research (Signed)
Title: A phase III Open-Label extension study to evaluate the long-term safety of Astegolimab in patiens with Chronic Obstructive Pulmonary disease.   Dose and Duration of Treatment: Astegolimab is presented as a sterile, slightly brown-yellow solution. Each single-use, 2.25 mL pre-filled syringe contains 1.7 mL deliverable volume. Astegolimab drug product is formulated at 140 mg/mL astegolimab with 114 mM succinic acid, 200 mM L-arginine, 10 mM L-methionine, 0.06% (w/v) polysorbate 20, pH 5.7. Placebo for astegolimab is supplied in an identical pre-filled syringe configuration.   Protocol # [id number] Sponsor: F. Mikey [employer]- [employer] [id number] [place]   Designer, fashion/clothing Product: Astegolimab [phone]) Protocol Version: v2 dated 03Apr2023 IB: version 9.0 dated April 2024 ICF:  Main ICF: [facility] Approved Version 01 Dec 2021, Revised 26 Mar 2023 Mobile Nursing: [facility] [employer] Approved Version 28 May 2022, Revised 11 Mar 2023 Lab Manual: Version 2.1.0 dated 07Mar2023    Mechanism of action: Astegolimab (also known as [id number] or [id number]) is a fully human, IgG2 monoclonal antibody that binds with high affinity to the interleukin (IL)-33 (IL-33) receptor, ST2, thereby blocking the signaling of IL-33, an inflammatory cytokine of the IL-1 family and member of the "alarmin" class of molecules. Astegolimab binds with high affinity to the human and cynomolgus monkey receptor for IL-33, ST2, and blocks IL-33 binding, thus inhibiting association with the IL-1R accessory protein (AcP) co-receptor and formation of an activated receptor complex.   Key Inclusion Criteria: Able and willing to provide written informed consent and to comply with the study protocol. Ability to comply with the requirements of study protocol, according to the investigators's best judgement. Completion of the 52-week treatment period in either [place] or Study [id number].  Key Exclusion  Criteria: Significant non- compliance in the parent study, specifically defined as missing scheduled visits, per investigators judgement. Any new clinically significant pulmonary disease other than COPD since enrolling in the study. Unstable cardiac disease, myocardial infarction, or New York Heart Association Class III or IV heart failure since enrolling in the parent study.      Integrated Pharmacokinetic/Pharmacodynamic Analysis:  In Studies [id number], [id number], and [id number], exploratory biomarker analysis showed there were consistent decreases in blood eosinophil counts throughout the treatment period, potentially mediated by a direct effect of IL-33 on eosinophil progenitors. In [place], there was no significant difference in fractional exhaled nitric oxide (FeNO) levels (reflecting airway IL-4/IL-13 activity) between astegolimab-treated groups relative to placebo throughout the treatment period. These data suggest that astegolimab has only a limited effect on Type 2 inflammation in asthma. No data are applicable for Study [id number]. No data are available yet for Study WN02725.  Special Warnings/Considerations: Administration of astegolimab, a protein therapeutic, may lead to the development of anti-astegolimab antibodies which could lead to AEs and/or decreased exposure. In non-clinical studies (see Section 4.2), ADA incidence has generally been low and there was no apparent ADA impact on PK and safety in these studies. To date, the immunogenicity rates observed with astegolimab in clinical studies have been relatively low as well (see Immunogenicity Section 5.6). Several clinical studies have been conducted in patients with asthma, atopic dermatitis, COPD (IIS Study [id number]) and COVID-19 severe pneumonia, which generally showed low incidence of ADAs (Table 31). There has been no correlation between ADA status and clinical findings or increased incidence of AEs. Astegolimab is now being considered in  a larger study for the treatment of COPD. This patient population is typically considered to have a hyper-responsive immune system. Route of administration for this molecule will  be [place], either Q2W or Q4W. These factors increase the risk of development of an immune response to astegolimab, specifically with repeat dosing. From the previous IIS Study WU98119, incidence of ADAs was low in COPD patients; this remains to be confirmed in a larger study. To monitor ADA development in ongoing studies, serum samples will be collected from patients at protocol-defined intervals. Patients who test positive for antibodies and have clinical sequelae that are considered potentially related to an ADA response may also be asked to return for additional follow-up testing.  Drug Interaction Studies: No PK drug interaction studies have been conducted to date.  Serious Adverse Reactions Observed in Asthma Study: During the treatment period, a similar proportion of patients across all cohorts experienced at least one AE, regardless of causality (Table 22). In total, 77.2%, 70.9%, 72.2%, and 72.1% of patients reported at least 1 AE in the placebo, 70 mg, 210 mg, and 490 mg groups, respectively. The most common AE's (>5% in any treatment group) were asthma, nasopharyngitis, upper respiratory tract infection, headache, and injection site reaction (ISR). The most common drug-related AE was ISR, which was reported more frequently in the astegolimab treatment groups than in the placebo group (1 [0.8%] patient in the placebo group, 10 [7.9%] patients with 70 mg, 8 [6.3%] patients with 210 mg, 6 [4.9%] patients with 490 mg). All ISRs were non-serious and mild or moderate in severity. During the treatment period, 50 SAEs were reported in 37 (7.4%) patients. The number of patients reporting SAEs was comparable across all cohorts (11 SAEs in 8 patients on placebo, 21 SAEs in 14 patients on 70 mg, 11 SAEs in 9 patients on 210 mg, and 7 SAEs in  6 patients on 490 mg). The most common SAE was asthma. One SAE of moderate livedo reticularis (70 mg) was considered a suspected unexpected serious adverse drug reaction (SUSAR) related to astegolimab and was reported two days after the second dose, leading to discontinuation of astegolimab. Two (0.4%) patients reported anaphylaxis and hypersensitivity reactions: 1 severe SAE of anaphylactic reaction (placebo), and 1 moderate hypersensitivity (490 mg) considered related to astegolimab. Three (0.6%) patients experienced a potential Major Adverse Cardiac Event (MACE) (1 patient each in the placebo, 70 mg, and 210 mg groups). None of the potential MACE were considered related to astegolimab. Overall, 233 (46.4%) patients reported events of infection. A comparable number of patients reported infection across all treatment groups (65 [51.2%] patients on placebo, 55 [43.3%] patients on 70 mg, 58 [46.0%] patients on 210 mg, and 55 [45.1%] patients on 490 mg). The most frequently reported infection (>=10% incidence) was nasopharyngitis (12.7%). One patient on astegolimab 210 mg and the partner of one patient on placebo became pregnant during the study. Both delivered normal/healthy babies. Two deaths, unrelated to study drug, were reported: one patient on 210 mg astegolimab died following an SAE of asthma; the other patient on 490 mg astegolimab had an unexplained death. There were no clinically meaningful changes in laboratory parameters, vital signs, or ECG results, other than the 10% decrease in mean blood eosinophil counts in the astegolimab-treated groups, with no safety concerns. Treatment-induced ADAs were comparable between the astegolimab groups and had no impact on safety. Overall, astegolimab was well tolerated at all doses used and had a safety profile consistent with that observed in the previous astegolimab Phase I studies.  Serious Adverse Reactions Observed in Previous COPD Study: In the completed IIS  Study [id number], a total of 81 patients received at least one dose  of astegolimab or placebo. The safety profile of astegolimab was similar to that of placebo. There were a total of 222 AEs reported in 62 patients. A total of 28 (72%) patients in the placebo arm and 34 (81%) patients in the astegolimab arm reported at least one AE. The most commonly reported AEs were headache followed by urinary tract infection and viral upper respiratory tract infection. A total of 39 SAEs were reported in 28 (35%) patients; 16 (41%) patients in the placebo group and 12 (29%) in the astegolimab group. The most commonly reported SAE was hospital admission for community acquired pneumonia. Four SAEs resulted in patient discontinuation from treatment (1 patient in the placebo group and 3 patients in the astegolimab group). One patient in the placebo group experienced an AESI of potential MACE (heart failure, unrelated to the study treatment). No anaphylaxis or pregnancies were reported. Two deaths, unrelated to study treatment, were reported: 1 patient in the placebo group died after hospital acquired pneumonia, and another patient in the placebo group died after pneumonia and type 2 respiratory failure.  Safety Data: Astegolimab has been generally well tolerated. There have been 49 patient deaths across all astegolimab studies (Section 5.5.2), none of which were considered related to astegolimab. A total of 144 subjects experienced a total of 224 SAEs across all astegolimab studies. Of these, an SAE livedo reticularis observed in 1 patient was considered related to astegolimab by the investigator (Section 5.5.3). AEs leading to withdrawal (Section 5.5.4) have generally occurred at a rate similar to what would be expected for clinical trials in the studies' respective indications.   [facility] [place] Clinical Research Coordinator note :    This visit for Subject [id number] with DOB: [birth year] on 26 Aug 2023 for the above  protocol is Visit/Encounter #  and is for purpose of research.    The consent for this encounter is under Protocol Version 2.0 , Investigator Brochure Version 7, Consent Version IRB Approved  revised  and is currently IRB approved.    Subject expressed continued interest and consent in continuing as a study subject. Subject confirmed that there was no change in contact information (e.g. address, telephone, email). Subject thanked for participation in research and contribution to science.   The Subject was informed that the PI Dr. Marchelle Gearing continues to have oversight of the subject's visits and course  through relevant discussions, reviews and also specifically of  this visit by routing of this note to the PI. All assessments and Procedures were performed per above stated protocol. Subject tolerated injections well. Please refer to subjects paper source binder for further details of this visit.       Signed by Laurelyn Sickle Clinical Research Coordinator  [facility]  [place], [place]

## 2023-09-06 ENCOUNTER — Encounter: Payer: Self-pay | Admitting: Adult Health

## 2023-09-06 ENCOUNTER — Telehealth: Payer: Self-pay | Admitting: *Deleted

## 2023-09-06 ENCOUNTER — Telehealth (INDEPENDENT_AMBULATORY_CARE_PROVIDER_SITE_OTHER): Payer: Medicare HMO | Admitting: Adult Health

## 2023-09-06 DIAGNOSIS — J069 Acute upper respiratory infection, unspecified: Secondary | ICD-10-CM | POA: Diagnosis not present

## 2023-09-06 DIAGNOSIS — J441 Chronic obstructive pulmonary disease with (acute) exacerbation: Secondary | ICD-10-CM

## 2023-09-06 DIAGNOSIS — J9611 Chronic respiratory failure with hypoxia: Secondary | ICD-10-CM

## 2023-09-06 MED ORDER — AZITHROMYCIN 250 MG PO TABS: ORAL_TABLET | ORAL | 0 refills | Status: AC

## 2023-09-06 MED ORDER — PREDNISONE 20 MG PO TABS: 20.0000 mg | ORAL_TABLET | Freq: Every day | ORAL | 0 refills | Status: DC

## 2023-09-06 MED ORDER — ALBUTEROL SULFATE (2.5 MG/3ML) 0.083% IN NEBU: 2.5000 mg | INHALATION_SOLUTION | Freq: Four times a day (QID) | RESPIRATORY_TRACT | 5 refills | Status: AC | PRN

## 2023-09-06 NOTE — Telephone Encounter (Signed)
ATC patient x1.  LVM to return call regarding appointment this afternoon.  When she returns call, please advise her that she either has to do a video visit or come into the office.

## 2023-09-06 NOTE — Patient Instructions (Addendum)
Continue on Breztri 2 puffs twice daily, rinse after use Albuterol inhaler or nebulizer as needed Continue on Zyrtec and Flonase daily Fluids and rest Tylenol as needed Saline nasal rinses as needed Z-Pak-antibiotic to have on hold if symptoms do not improve or worsen with discolored mucus. Prednisone 20 mg daily for 5 days to have on hold if symptoms do not improve or worsen with increased cough or wheezing. Continue on oxygen to maintain O2 saturations greater than 88 to 90% Follow-up with Dr. Marchelle Gearing in 4 to 6 weeks and as needed Please contact office for sooner follow up if symptoms do not improve or worsen or seek emergency care

## 2023-09-06 NOTE — Progress Notes (Signed)
Virtual Visit via Video Note  I connected with Katie Greene on 09/06/23 at  3:30 PM EST by a video enabled telemedicine application and verified that I am speaking with the correct person using two identifiers.  Location: Patient: Home  Provider: Office    I discussed the limitations of evaluation and management by telemedicine and the availability of in person appointments. The patient expressed understanding and agreed to proceed.  History of Present Illness: 68 year old female former smoker followed for very severe COPD, chronic respiratory failure on oxygen  Today's video visit is an acute office visit.  Patient complains over the last 3 days she has had increased cough, congestion, low-grade fever and runny nose.  She took a home COVID test and was negative.  She says she has had grandchildren that have had similar symptoms and feels that she got exposed.  Appetite is slightly down but no nausea vomiting or diarrhea.  No hemoptysis, chest pain, orthopnea, edema. Patient has severe COPD, remains on Breztri twice daily. Needs a refill of her albuterol nebulizer.  She remains on Zyrtec and Flonase for chronic allergies. She is on oxygen 2 L with activity.  Patient does participate in the research trials with [facility] .  Currently in the ARNASA trial . -OLE   Past Medical History:  Diagnosis Date   Allergy    Anxiety    Arthritis    Chest pain    Improved   Chronic respiratory failure (HCC)    COPD (chronic obstructive pulmonary disease) (HCC)    Stage IV, oxygen dependant, triology ventilator   Depression    Emphysema of lung (HCC)    Fatigue    GERD (gastroesophageal reflux disease)    Hemorrhoids    History of colon polyps    Hyperlipidemia    Hypertension    Iron deficiency anemia    On home oxygen therapy    oxygen therapy 2-3 L/m nasally 24/7   Oxygen deficiency    Palpitations    Pulmonary nodules    Seasonal allergies    Wears dentures    Wears glasses     Current Outpatient Medications on File Prior to Visit  Medication Sig Dispense Refill   Acetylcysteine (N-ACETYL-L-CYSTEINE) 600 MG CAPS Take 600 mg by mouth 2 (two) times a day.      albuterol (VENTOLIN HFA) 108 (90 Base) MCG/ACT inhaler INHALE 1 TO 2 PUFFS EVERY 6 HOURS AS NEEDED 3 each 3   ALPRAZolam (XANAX) 0.5 MG tablet Take 0.5 mg by mouth 3 (three) times daily as needed for anxiety.     AMBULATORY NON FORMULARY MEDICATION at bedtime. VENTILATOR machine at night     aspirin EC 81 MG tablet Take 81 mg by mouth daily.      BREZTRI AEROSPHERE 160-9-4.8 MCG/ACT AERO INHALE 2 PUFFS INTO THE LUNGS IN THE MORNING AND AT BEDTIME. 3 each 3   cetirizine (ZYRTEC) 10 MG tablet Take 10 mg by mouth daily.      cholecalciferol (VITAMIN D) 25 MCG (1000 UNIT) tablet Take 1,000 Units by mouth daily.     diltiazem (CARDIZEM CD) 180 MG 24 hr capsule Take 180 mg by mouth daily.      ferrous sulfate 325 (65 FE) MG tablet Take 325 mg by mouth daily with breakfast.     fluticasone (FLONASE) 50 MCG/ACT nasal spray USE 1 SPRAY IN BOTH NOSTRILS AT BEDTIME. 48 each 3   hydrochlorothiazide (HYDRODIURIL) 25 MG tablet Take 25 mg by mouth daily.  metoprolol succinate (TOPROL-XL) 50 MG 24 hr tablet Take 50 mg by mouth daily before breakfast. Take with or immediately following a meal.     nitroGLYCERIN (NITROSTAT) 0.4 MG SL tablet Place 0.4 mg under the tongue every 5 (five) minutes as needed for chest pain.     omeprazole (PRILOSEC) 20 MG capsule Take 20 mg by mouth daily.      OXYGEN Inhale 2 L into the lungs continuous.     pravastatin (PRAVACHOL) 80 MG tablet Take 80 mg by mouth every morning.      spironolactone (ALDACTONE) 50 MG tablet Take 25 mg by mouth daily.      Study - ARNASA [id number] - astegolimab 238 mg/1.7 mL or placebo SQ injection (PI-Ramaswamy) Inject 476 mg into the skin every 14 (fourteen) days. For Investigational Use Only. Administer 2 injections into the abdomen (total of 3.4 mL), switching  sides for each injection. Every 14 days.  Please contact [facility] for any questions or concerns regarding this medication.     temazepam (RESTORIL) 30 MG capsule Take 30 mg by mouth at bedtime.  4   Current Facility-Administered Medications on File Prior to Visit  Medication Dose Route Frequency Provider Last Rate Last Valerie Salts - ARNASA [id number] (open-label) - astegolimab 238 mg/1.7 mL SQ injection (PI-Ramaswamy)  476 mg Subcutaneous Q14 Days    476 mg at 08/26/23 1053   Study - ARNASA [id number] - astegolimab 238 mg/1.7 mL or placebo SQ injection (PI-Ramaswamy)  476 mg Subcutaneous Q14 Days    476 mg at 04/22/23 1044        Observations/Objective: 09/06/2023 Appears in nad.   LDCT chest 06/2023-lung RADS 2 S with stable small pulmonary nodules, moderate emphysema  Assessment and Plan: Mild COPD exacerbation with viral illness-continue with supportive care.  Robitussin DM twice daily as needed for cough and congestion.  Continue current maintenance regimen.  Will give Z-Pak and short prednisone burst in case symptoms do not improve or worsen with discolored mucus.  Patient is high risk for decompensation.  Advise if symptoms are not improving or worsen with discolored mucus she is to begin antibiotics and steroids.  If symptoms are not improving or worsen further and she is to call back for sooner office visit or seek emergency room care  Viral URI-continue with supportive care.  Fluids and rest.  Tylenol as needed  O2 RF -continue on oxygen to maintain O2 saturations greater than 88 to 90%  Plan  . Patient Instructions  Continue on Breztri 2 puffs twice daily, rinse after use Albuterol inhaler or nebulizer as needed Continue on Zyrtec and Flonase daily Fluids and rest Tylenol as needed Saline nasal rinses as needed Z-Pak-antibiotic to have on hold if symptoms do not improve or worsen with discolored mucus. Prednisone 20 mg daily for 5 days to have on hold if symptoms do not  improve or worsen with increased cough or wheezing. Continue on oxygen to maintain O2 saturations greater than 88 to 90% Follow-up with Dr. Marchelle Gearing in 4 to 6 weeks and as needed Please contact office for sooner follow up if symptoms do not improve or worsen or seek emergency care      Follow Up Instructions:    I discussed the assessment and treatment plan with the patient. The patient was provided an opportunity to ask questions and all were answered. The patient agreed with the plan and demonstrated an understanding of the instructions.   The patient was advised to call  back or seek an in-person evaluation if the symptoms worsen or if the condition fails to improve as anticipated.  I provided 30 minutes of non-face-to-face time during this encounter.   Rubye Oaks, NP

## 2023-09-09 ENCOUNTER — Encounter: Payer: Medicare HMO | Admitting: *Deleted

## 2023-09-09 DIAGNOSIS — J449 Chronic obstructive pulmonary disease, unspecified: Secondary | ICD-10-CM

## 2023-09-09 MED ORDER — STUDY - ARNASA GB43374 (OPEN-LABEL) - ASTEGOLIMAB 238 MG/1.7 ML SQ INJECTION (PI-RAMASWAMY)
476.0000 mg | INJECTION | SUBCUTANEOUS | Status: DC
  Administered 2023-09-09: 476 mg via SUBCUTANEOUS
  Filled 2023-09-09: qty 3.4

## 2023-09-09 NOTE — Research (Signed)
Title: A phase III Open-Label extension study to evaluate the long-term safety of Astegolimab in patiens with Chronic Obstructive Pulmonary disease.   Dose and Duration of Treatment: Astegolimab is presented as a sterile, slightly brown-yellow solution. Each single-use, 2.25 mL pre-filled syringe contains 1.7 mL deliverable volume. Astegolimab drug product is formulated at 140 mg/mL astegolimab with 114 mM succinic acid, 200 mM L-arginine, 10 mM L-methionine, 0.06% (w/v) polysorbate 20, pH 5.7. Placebo for astegolimab is supplied in an identical pre-filled syringe configuration.   Protocol # [id number] Sponsor: [employer] [address] [place]   Designer, fashion/clothing Product: Astegolimab [phone]) Protocol Version: v2 dated 03Apr2023 IB: version 9.0 dated April 2024 ICF:  Main ICF: [facility] IR[facility] Approved Version 01 Dec 2021, Revised 26 Mar 2023 Mobile Nursing: [facility] Approved Version 28 May 2022, Revised 11 Mar 2023 Lab Manual: Version 2.1.0 dated 07Mar2023    Mechanism of action: Astegolimab (also known as [id number] or [id number]) is a fully human, IgG2 monoclonal antibody that binds with high affinity to the interleukin (IL)-33 (IL-33) receptor, ST2, thereby blocking the signaling of IL-33, an inflammatory cytokine of the IL-1 family and member of the "alarmin" class of molecules. Astegolimab binds with high affinity to the human and cynomolgus monkey receptor for IL-33, ST2, and blocks IL-33 binding, thus inhibiting association with the IL-1R accessory protein (AcP) co-receptor and formation of an activated receptor complex.   Key Inclusion Criteria: Able and willing to provide written informed consent and to comply with the study protocol. Ability to comply with the requirements of study protocol, according to the investigators's best judgement. Completion of the 52-week treatment period in either [place] or Study [id number].  Key Exclusion  Criteria: Significant non- compliance in the parent study, specifically defined as missing scheduled visits, per investigators judgement. Any new clinically significant pulmonary disease other than COPD since enrolling in the study. Unstable cardiac disease, myocardial infarction, or New York Heart Association Class III or IV heart failure since enrolling in the parent study.      Integrated Pharmacokinetic/Pharmacodynamic Analysis:  In Studies [id number], [id number], and [id number], exploratory biomarker analysis showed there were consistent decreases in blood eosinophil counts throughout the treatment period, potentially mediated by a direct effect of IL-33 on eosinophil progenitors. In [place], there was no significant difference in fractional exhaled nitric oxide (FeNO) levels (reflecting airway IL-4/IL-13 activity) between astegolimab-treated groups relative to placebo throughout the treatment period. These data suggest that astegolimab has only a limited effect on Type 2 inflammation in asthma. No data are applicable for Study [id number]. No data are available yet for Study [id number].  Special Warnings/Considerations: Administration of astegolimab, a protein therapeutic, may lead to the development of anti-astegolimab antibodies which could lead to AEs and/or decreased exposure. In non-clinical studies (see Section 4.2), ADA incidence has generally been low and there was no apparent ADA impact on PK and safety in these studies. To date, the immunogenicity rates observed with astegolimab in clinical studies have been relatively low as well (see Immunogenicity Section 5.6). Several clinical studies have been conducted in patients with asthma, atopic dermatitis, COPD (IIS Study [id number]) and COVID-19 severe pneumonia, which generally showed low incidence of ADAs (Table 31). There has been no correlation between ADA status and clinical findings or increased incidence of AEs. Astegolimab is now being considered in  a larger study for the treatment of COPD. This patient population is typically considered to have a hyper-responsive immune system. Route of administration for this molecule will  be [place], either Q2W or Q4W. These factors increase the risk of development of an immune response to astegolimab, specifically with repeat dosing. From the previous IIS Study [id number], incidence of ADAs was low in COPD patients; this remains to be confirmed in a larger study. To monitor ADA development in ongoing studies, serum samples will be collected from patients at protocol-defined intervals. Patients who test positive for antibodies and have clinical sequelae that are considered potentially related to an ADA response may also be asked to return for additional follow-up testing.  Drug Interaction Studies: No PK drug interaction studies have been conducted to date.  Serious Adverse Reactions Observed in Asthma Study: During the treatment period, a similar proportion of patients across all cohorts experienced at least one AE, regardless of causality (Table 22). In total, 77.2%, 70.9%, 72.2%, and 72.1% of patients reported at least 1 AE in the placebo, 70 mg, 210 mg, and 490 mg groups, respectively. The most common AE's (>5% in any treatment group) were asthma, nasopharyngitis, upper respiratory tract infection, headache, and injection site reaction (ISR). The most common drug-related AE was ISR, which was reported more frequently in the astegolimab treatment groups than in the placebo group (1 [0.8%] patient in the placebo group, 10 [7.9%] patients with 70 mg, 8 [6.3%] patients with 210 mg, 6 [4.9%] patients with 490 mg). All ISRs were non-serious and mild or moderate in severity. During the treatment period, 50 SAEs were reported in 37 (7.4%) patients. The number of patients reporting SAEs was comparable across all cohorts (11 SAEs in 8 patients on placebo, 21 SAEs in 14 patients on 70 mg, 11 SAEs in 9 patients on 210 mg, and 7 SAEs in  6 patients on 490 mg). The most common SAE was asthma. One SAE of moderate livedo reticularis (70 mg) was considered a suspected unexpected serious adverse drug reaction (SUSAR) related to astegolimab and was reported two days after the second dose, leading to discontinuation of astegolimab. Two (0.4%) patients reported anaphylaxis and hypersensitivity reactions: 1 severe SAE of anaphylactic reaction (placebo), and 1 moderate hypersensitivity (490 mg) considered related to astegolimab. Three (0.6%) patients experienced a potential Major Adverse Cardiac Event (MACE) (1 patient each in the placebo, 70 mg, and 210 mg groups). None of the potential MACE were considered related to astegolimab. Overall, 233 (46.4%) patients reported events of infection. A comparable number of patients reported infection across all treatment groups (65 [51.2%] patients on placebo, 55 [43.3%] patients on 70 mg, 58 [46.0%] patients on 210 mg, and 55 [45.1%] patients on 490 mg). The most frequently reported infection (>=10% incidence) was nasopharyngitis (12.7%). One patient on astegolimab 210 mg and the partner of one patient on placebo became pregnant during the study. Both delivered normal/healthy babies. Two deaths, unrelated to study drug, were reported: one patient on 210 mg astegolimab died following an SAE of asthma; the other patient on 490 mg astegolimab had an unexplained death. There were no clinically meaningful changes in laboratory parameters, vital signs, or ECG results, other than the 10% decrease in mean blood eosinophil counts in the astegolimab-treated groups, with no safety concerns. Treatment-induced ADAs were comparable between the astegolimab groups and had no impact on safety. Overall, astegolimab was well tolerated at all doses used and had a safety profile consistent with that observed in the previous astegolimab Phase I studies.  Serious Adverse Reactions Observed in Previous COPD Study: In the completed IIS  Study [id number], a total of 81 patients received at least one dose  of astegolimab or placebo. The safety profile of astegolimab was similar to that of placebo. There were a total of 222 AEs reported in 62 patients. A total of 28 (72%) patients in the placebo arm and 34 (81%) patients in the astegolimab arm reported at least one AE. The most commonly reported AEs were headache followed by urinary tract infection and viral upper respiratory tract infection. A total of 39 SAEs were reported in 28 (35%) patients; 16 (41%) patients in the placebo group and 12 (29%) in the astegolimab group. The most commonly reported SAE was hospital admission for community acquired pneumonia. Four SAEs resulted in patient discontinuation from treatment (1 patient in the placebo group and 3 patients in the astegolimab group). One patient in the placebo group experienced an AESI of potential MACE (heart failure, unrelated to the study treatment). No anaphylaxis or pregnancies were reported. Two deaths, unrelated to study treatment, were reported: 1 patient in the placebo group died after hospital acquired pneumonia, and another patient in the placebo group died after pneumonia and type 2 respiratory failure.  Safety Data: Astegolimab has been generally well tolerated. There have been 49 patient deaths across all astegolimab studies (Section 5.5.2), none of which were considered related to astegolimab. A total of 144 subjects experienced a total of 224 SAEs across all astegolimab studies. Of these, an SAE livedo reticularis observed in 1 patient was considered related to astegolimab by the investigator (Section 5.5.3). AEs leading to withdrawal (Section 5.5.4) have generally occurred at a rate similar to what would be expected for clinical trials in the studies' respective indications.   [facility] [place] Clinical Research Coordinator note :    This visit for Subject [id number] with DOB: 11-03-1955 on 09 Sep 2023 for the above  protocol is Visit/Encounter # week 16  and is for purpose of research.    Subject expressed continued interest and consent in continuing as a study subject. Subject confirmed that there was no change in contact information (e.g. address, telephone, email). Subject thanked for participation in research and contribution to science.  The Subject was informed that the PI Dr. Marchelle Gearing continues to have oversight of the subject's visits and course  through relevant discussions, reviews and also specifically of this visit by routing of this note to the PI. [facility]  Clinical Research Coordinator note:   This visit for Subject ANISTEN TOMASSI with DOB: 03-10-1956 on 09/09/2023 for the above protocol is Visit/Encounter # week 16  and is for purpose of research.      Subject  expressed continued interest and consent in continuing as a study subject. Subject confirmed that there was  no  change in contact information (e.g. address, telephone, email). Subject thanked for participation in research and contribution to science. The Subject was yes informed that the PI Kalman Shan, MD continues to have oversight of the subject's visits and course through relevant discussions, reviews, and also specifically of this visit by routing of this note to the PI.  All assessments and procedures were performed per above stated protocol. Subject tolerated injections well. Please refer to subjects paper source binder for further details of this visit.  Signed by  Laurelyn Sickle  Clinical Research Coordinator / Nurse Judie Bonus, [place] 12:45 PM 09/09/2023

## 2023-09-20 ENCOUNTER — Other Ambulatory Visit: Payer: Self-pay | Admitting: Internal Medicine

## 2023-09-21 DIAGNOSIS — J449 Chronic obstructive pulmonary disease, unspecified: Secondary | ICD-10-CM

## 2023-09-21 MED ORDER — STUDY - ARNASA GB43374 (OPEN-LABEL) - ASTEGOLIMAB 238 MG/1.7 ML SQ INJECTION (PI-RAMASWAMY)
476.0000 mg | INJECTION | SUBCUTANEOUS | Status: DC
Start: 2023-09-21 — End: 2023-10-07
  Administered 2023-09-21: 476 mg via SUBCUTANEOUS
  Filled 2023-09-21: qty 3.4

## 2023-09-23 NOTE — Research (Signed)
Title: A phase III Open-Label extension study to evaluate the long-term safety of Astegolimab in patiens with Chronic Obstructive Pulmonary disease.   Dose and Duration of Treatment: Astegolimab is presented as a sterile, slightly brown-yellow solution. Each single-use, 2.25 mL pre-filled syringe contains 1.7 mL deliverable volume. Astegolimab drug product is formulated at 140 mg/mL astegolimab with 114 mM succinic acid, 200 mM L-arginine, 10 mM L-methionine, 0.06% (w/v) polysorbate 20, pH 5.7. Placebo for astegolimab is supplied in an identical pre-filled syringe configuration.   Protocol # [id number] Sponsor: [employer] [address], [place]   Protocol Version: v3 dated 15Oct2024 IB: version 9.0 dated April 2024 ICF:  ICFs: Make sure that the subject is re-consented with the updated ICFs Main ICF: [facility] Approved Version 01 Jul 2023, Revised 28Nov2024 Mobile Nursing: [facility] Approved Version 26Oct2023, Revised 08Aug2024 Optional Home Study Treatment: IRB Approved Version 28 May 2022 Revised 11 Mar 2023 ADDENDUM 1: IRB Approved Version 24Jul2024 Revised 8Aug2024 Lab Manual: Version 2.1.0 dated 07Mar2023   Mechanism of action: Astegolimab (also known as [id number] or [id number]) is a fully human, IgG2 monoclonal antibody that binds with high affinity to the interleukin (IL)-33 (IL-33) receptor, ST2, thereby blocking the signaling of IL-33, an inflammatory cytokine of the IL-1 family and member of the "alarmin" class of molecules. Astegolimab binds with high affinity to the human and cynomolgus monkey receptor for IL-33, ST2, and blocks IL-33 binding, thus inhibiting association with the IL-1R accessory protein (AcP) co-receptor and formation of an activated receptor complex.   Key Inclusion Criteria: Able and willing to provide written informed consent and to comply with the study protocol. Ability to comply with the requirements of study protocol,  according to the investigators's best judgement. Completion of the 52-week treatment period in either [place] or Study [id number].  Key Exclusion Criteria: Significant non- compliance in the parent study, specifically defined as missing scheduled visits, per investigators judgement. Any new clinically significant pulmonary disease other than COPD since enrolling in the study. Unstable cardiac disease, myocardial infarction, or New York Heart Association Class III or IV heart failure since enrolling in the parent study.      Integrated Pharmacokinetic/Pharmacodynamic Analysis:  In Studies [id number], [id number], and [id number], exploratory biomarker analysis showed there were consistent decreases in blood eosinophil counts throughout the treatment period, potentially mediated by a direct effect of IL-33 on eosinophil progenitors. In [place], there was no significant difference in fractional exhaled nitric oxide (FeNO) levels (reflecting airway IL-4/IL-13 activity) between astegolimab-treated groups relative to placebo throughout the treatment period. These data suggest that astegolimab has only a limited effect on Type 2 inflammation in asthma. No data are applicable for Study [id number]. No data are available yet for Study [id number].  Special Warnings/Considerations: Administration of astegolimab, a protein therapeutic, may lead to the development of anti-astegolimab antibodies which could lead to AEs and/or decreased exposure. In non-clinical studies (see Section 4.2), ADA incidence has generally been low and there was no apparent ADA impact on PK and safety in these studies. To date, the immunogenicity rates observed with astegolimab in clinical studies have been relatively low as well (see Immunogenicity Section 5.6). Several clinical studies have been conducted in patients with asthma, atopic dermatitis, COPD (IIS Study [id number]) and COVID-19 severe pneumonia, which generally showed low incidence of ADAs  (Table 31). There has been no correlation between ADA status and clinical findings or increased incidence of AEs. Astegolimab is now being considered in a larger study for the  treatment of COPD. This patient population is typically considered to have a hyper-responsive immune system. Route of administration for this molecule will be [place], either Q2W or Q4W. These factors increase the risk of development of an immune response to astegolimab, specifically with repeat dosing. From the previous IIS Study [id number], incidence of ADAs was low in COPD patients; this remains to be confirmed in a larger study. To monitor ADA development in ongoing studies, serum samples will be collected from patients at protocol-defined intervals. Patients who test positive for antibodies and have clinical sequelae that are considered potentially related to an ADA response may also be asked to return for additional follow-up testing.  Drug Interaction Studies: No PK drug interaction studies have been conducted to date.  Serious Adverse Reactions Observed in Asthma Study: During the treatment period, a similar proportion of patients across all cohorts experienced at least one AE, regardless of causality (Table 22). In total, 77.2%, 70.9%, 72.2%, and 72.1% of patients reported at least 1 AE in the placebo, 70 mg, 210 mg, and 490 mg groups, respectively. The most common AE's (>5% in any treatment group) were asthma, nasopharyngitis, upper respiratory tract infection, headache, and injection site reaction (ISR). The most common drug-related AE was ISR, which was reported more frequently in the astegolimab treatment groups than in the placebo group (1 [0.8%] patient in the placebo group, 10 [7.9%] patients with 70 mg, 8 [6.3%] patients with 210 mg, 6 [4.9%] patients with 490 mg). All ISRs were non-serious and mild or moderate in severity. During the treatment period, 50 SAEs were reported in 37 (7.4%) patients. The number of patients reporting  SAEs was comparable across all cohorts (11 SAEs in 8 patients on placebo, 21 SAEs in 14 patients on 70 mg, 11 SAEs in 9 patients on 210 mg, and 7 SAEs in 6 patients on 490 mg). The most common SAE was asthma. One SAE of moderate livedo reticularis (70 mg) was considered a suspected unexpected serious adverse drug reaction (SUSAR) related to astegolimab and was reported two days after the second dose, leading to discontinuation of astegolimab. Two (0.4%) patients reported anaphylaxis and hypersensitivity reactions: 1 severe SAE of anaphylactic reaction (placebo), and 1 moderate hypersensitivity (490 mg) considered related to astegolimab. Three (0.6%) patients experienced a potential Major Adverse Cardiac Event (MACE) (1 patient each in the placebo, 70 mg, and 210 mg groups). None of the potential MACE were considered related to astegolimab. Overall, 233 (46.4%) patients reported events of infection. A comparable number of patients reported infection across all treatment groups (65 [51.2%] patients on placebo, 55 [43.3%] patients on 70 mg, 58 [46.0%] patients on 210 mg, and 55 [45.1%] patients on 490 mg). The most frequently reported infection (>=10% incidence) was nasopharyngitis (12.7%). One patient on astegolimab 210 mg and the partner of one patient on placebo became pregnant during the study. Both delivered normal/healthy babies. Two deaths, unrelated to study drug, were reported: one patient on 210 mg astegolimab died following an SAE of asthma; the other patient on 490 mg astegolimab had an unexplained death. There were no clinically meaningful changes in laboratory parameters, vital signs, or ECG results, other than the 10% decrease in mean blood eosinophil counts in the astegolimab-treated groups, with no safety concerns. Treatment-induced ADAs were comparable between the astegolimab groups and had no impact on safety. Overall, astegolimab was well tolerated at all doses used and had a safety profile  consistent with that observed in the previous astegolimab Phase I studies.  Serious Adverse  Reactions Observed in Previous COPD Study: In the completed IIS Study [id number], a total of 81 patients received at least one dose of astegolimab or placebo. The safety profile of astegolimab was similar to that of placebo. There were a total of 222 AEs reported in 62 patients. A total of 28 (72%) patients in the placebo arm and 34 (81%) patients in the astegolimab arm reported at least one AE. The most commonly reported AEs were headache followed by urinary tract infection and viral upper respiratory tract infection. A total of 39 SAEs were reported in 28 (35%) patients; 16 (41%) patients in the placebo group and 12 (29%) in the astegolimab group. The most commonly reported SAE was hospital admission for community acquired pneumonia. Four SAEs resulted in patient discontinuation from treatment (1 patient in the placebo group and 3 patients in the astegolimab group). One patient in the placebo group experienced an AESI of potential MACE (heart failure, unrelated to the study treatment). No anaphylaxis or pregnancies were reported. Two deaths, unrelated to study treatment, were reported: 1 patient in the placebo group died after hospital acquired pneumonia, and another patient in the placebo group died after pneumonia and type 2 respiratory failure.  Safety Data: Astegolimab has been generally well tolerated. There have been 49 patient deaths across all astegolimab studies (Section 5.5.2), none of which were considered related to astegolimab. A total of 144 subjects experienced a total of 224 SAEs across all astegolimab studies. Of these, an SAE livedo reticularis observed in 1 patient was considered related to astegolimab by the investigator (Section 5.5.3). AEs leading to withdrawal (Section 5.5.4) have generally occurred at a rate similar to what would be expected for clinical trials in the studies' respective  indications.   [facility] [place] Clinical Research Coordinator note :    This visit for Subject [id number] with DOB: [birth year] on 21 Sep 2023 for the above protocol is Visit/Encounter # week 18 and is for purpose of research.    The consent for this encounter is under Protocol Version 2.0 , Investigator Brochure Version 7, Consent Version IRB Approved  revised  and is currently IRB approved.    Subject expressed continued interest and consent in continuing as a study subject. Subject confirmed that there was no change in contact information (e.g. address, telephone, email). Subject thanked for participation in research and contribution to science. The Subject was informed that the PI Dr. Marchelle Gearing continues to have oversight of the subject's visits and course  through relevant discussions, reviews and also specifically of this visit by routing of this note to the PI. [facility] [place] Clinical Research Coordinator note:   This visit for Subject Katie Greene with DOB: 07-08-1956 on 09/21/2023 for the above protocol is Visit/Encounter # week 18  and is for purpose of research.   Subject expressed continued interest and consent in continuing as a study subject. Subject confirmed that there was  no   change in contact information (e.g. address, telephone, email). Subject thanked for participation in research and contribution to science.The Subject was yesinformed that the PI Murali Ramaswamy,MD continues to have oversight of the subject's visits and course through relevant discussions, reviews, and also specifically of this visit by routing of this note to the PI. All assessments and Procedures were performed per above stated protocol. Subject tolerated injections well. Please refer to subjects paper source binder for further details of this visit.       Signed by Laurelyn Sickle Clinical  Research Coordinator  [facility]  [place], [place]

## 2023-10-07 ENCOUNTER — Encounter: Payer: Medicare HMO | Admitting: *Deleted

## 2023-10-07 DIAGNOSIS — J449 Chronic obstructive pulmonary disease, unspecified: Secondary | ICD-10-CM

## 2023-10-07 MED ORDER — STUDY - ARNASA GB43374 (OPEN-LABEL) - ASTEGOLIMAB 238 MG/1.7 ML SQ INJECTION (PI-RAMASWAMY)
476.0000 mg | INJECTION | SUBCUTANEOUS | Status: AC
  Administered 2023-10-07: 476 mg via SUBCUTANEOUS
  Filled 2023-10-07: qty 3.4

## 2023-10-07 NOTE — Research (Signed)
 Title: A phase III Open-Label extension study to evaluate the long-term safety of Astegolimab in patiens with Chronic Obstructive Pulmonary disease.   Dose and Duration of Treatment: Astegolimab is presented as a sterile, slightly brown-yellow solution. Each single-use, 2.25 mL pre-filled syringe contains 1.7 mL deliverable volume. Astegolimab drug product is formulated at 140 mg/mL astegolimab with 114 mM succinic acid, 200 mM L-arginine, 10 mM L-methionine, 0.06% (w/v) polysorbate 20, pH 5.7. Placebo for astegolimab is supplied in an identical pre-filled syringe configuration.   Protocol # [id number] Sponsor: [employer] [address] [place]   Protocol Version: v3 dated 15Oct2024 IB: version 9.0 dated April 2024 Main ICF: [facility] Approved Version 01 Jul 2023, Revised 28Nov2024 Mobile Nursing: [facility] Approved Version 26Oct2023, Revised 08Aug2024 Optional Home Study Treatment: IRB Approved Version 28 May 2022 Revised 11 Mar 2023 ADDENDUM 1: IRB Approved Version 24Jul2024 Revised 8Aug2024 Lab Manual: Version 2.1.0 dated 07Mar2023  Mechanism of action: Astegolimab (also known as [id number] or [id number]) is a fully human, IgG2 monoclonal antibody that binds with high affinity to the interleukin (IL)-33 (IL-33) receptor, ST2, thereby blocking the signaling of IL-33, an inflammatory cytokine of the IL-1 family and member of the "alarmin" class of molecules. Astegolimab binds with high affinity to the human and cynomolgus monkey receptor for IL-33, ST2, and blocks IL-33 binding, thus inhibiting association with the IL-1R accessory protein (AcP) co-receptor and formation of an activated receptor complex.   Key Inclusion Criteria: Able and willing to provide written informed consent and to comply with the study protocol. Ability to comply with the requirements of study protocol, according to the investigators's best judgement. Completion of the 52-week treatment  period in either [place] or Study [id number].  Key Exclusion Criteria: Significant non- compliance in the parent study, specifically defined as missing scheduled visits, per investigators judgement. Any new clinically significant pulmonary disease other than COPD since enrolling in the study. Unstable cardiac disease, myocardial infarction, or New York Heart Association Class III or IV heart failure since enrolling in the parent study.      Integrated Pharmacokinetic/Pharmacodynamic Analysis:  In Studies [id number], [id number], and [id number], exploratory biomarker analysis showed there were consistent decreases in blood eosinophil counts throughout the treatment period, potentially mediated by a direct effect of IL-33 on eosinophil progenitors. In [place], there was no significant difference in fractional exhaled nitric oxide (FeNO) levels (reflecting airway IL-4/IL-13 activity) between astegolimab-treated groups relative to placebo throughout the treatment period. These data suggest that astegolimab has only a limited effect on Type 2 inflammation in asthma. No data are applicable for Study [id number]. No data are available yet for Study [id number].  Special Warnings/Considerations: Administration of astegolimab, a protein therapeutic, may lead to the development of anti-astegolimab antibodies which could lead to AEs and/or decreased exposure. In non-clinical studies (see Section 4.2), ADA incidence has generally been low and there was no apparent ADA impact on PK and safety in these studies. To date, the immunogenicity rates observed with astegolimab in clinical studies have been relatively low as well (see Immunogenicity Section 5.6). Several clinical studies have been conducted in patients with asthma, atopic dermatitis, COPD (IIS Study GM01027) and COVID-19 severe pneumonia, which generally showed low incidence of ADAs (Table 31). There has been no correlation between ADA status and clinical findings or  increased incidence of AEs. Astegolimab is now being considered in a larger study for the treatment of COPD. This patient population is typically considered to have a hyper-responsive immune system.  Route of administration for this molecule will be [place], either Q2W or Q4W. These factors increase the risk of development of an immune response to astegolimab, specifically with repeat dosing. From the previous IIS Study [id number], incidence of ADAs was low in COPD patients; this remains to be confirmed in a larger study. To monitor ADA development in ongoing studies, serum samples will be collected from patients at protocol-defined intervals. Patients who test positive for antibodies and have clinical sequelae that are considered potentially related to an ADA response may also be asked to return for additional follow-up testing.  Drug Interaction Studies: No PK drug interaction studies have been conducted to date.  Serious Adverse Reactions Observed in Asthma Study: During the treatment period, a similar proportion of patients across all cohorts experienced at least one AE, regardless of causality (Table 22). In total, 77.2%, 70.9%, 72.2%, and 72.1% of patients reported at least 1 AE in the placebo, 70 mg, 210 mg, and 490 mg groups, respectively. The most common AE's (>5% in any treatment group) were asthma, nasopharyngitis, upper respiratory tract infection, headache, and injection site reaction (ISR). The most common drug-related AE was ISR, which was reported more frequently in the astegolimab treatment groups than in the placebo group (1 [0.8%] patient in the placebo group, 10 [7.9%] patients with 70 mg, 8 [6.3%] patients with 210 mg, 6 [4.9%] patients with 490 mg). All ISRs were non-serious and mild or moderate in severity. During the treatment period, 50 SAEs were reported in 37 (7.4%) patients. The number of patients reporting SAEs was comparable across all cohorts (11 SAEs in 8 patients on placebo, 21 SAEs in  14 patients on 70 mg, 11 SAEs in 9 patients on 210 mg, and 7 SAEs in 6 patients on 490 mg). The most common SAE was asthma. One SAE of moderate livedo reticularis (70 mg) was considered a suspected unexpected serious adverse drug reaction (SUSAR) related to astegolimab and was reported two days after the second dose, leading to discontinuation of astegolimab. Two (0.4%) patients reported anaphylaxis and hypersensitivity reactions: 1 severe SAE of anaphylactic reaction (placebo), and 1 moderate hypersensitivity (490 mg) considered related to astegolimab. Three (0.6%) patients experienced a potential Major Adverse Cardiac Event (MACE) (1 patient each in the placebo, 70 mg, and 210 mg groups). None of the potential MACE were considered related to astegolimab. Overall, 233 (46.4%) patients reported events of infection. A comparable number of patients reported infection across all treatment groups (65 [51.2%] patients on placebo, 55 [43.3%] patients on 70 mg, 58 [46.0%] patients on 210 mg, and 55 [45.1%] patients on 490 mg). The most frequently reported infection (>=10% incidence) was nasopharyngitis (12.7%). One patient on astegolimab 210 mg and the partner of one patient on placebo became pregnant during the study. Both delivered normal/healthy babies. Two deaths, unrelated to study drug, were reported: one patient on 210 mg astegolimab died following an SAE of asthma; the other patient on 490 mg astegolimab had an unexplained death. There were no clinically meaningful changes in laboratory parameters, vital signs, or ECG results, other than the 10% decrease in mean blood eosinophil counts in the astegolimab-treated groups, with no safety concerns. Treatment-induced ADAs were comparable between the astegolimab groups and had no impact on safety. Overall, astegolimab was well tolerated at all doses used and had a safety profile consistent with that observed in the previous astegolimab Phase I studies.  Serious  Adverse Reactions Observed in Previous COPD Study: In the completed IIS Study [id number], a total of  81 patients received at least one dose of astegolimab or placebo. The safety profile of astegolimab was similar to that of placebo. There were a total of 222 AEs reported in 62 patients. A total of 28 (72%) patients in the placebo arm and 34 (81%) patients in the astegolimab arm reported at least one AE. The most commonly reported AEs were headache followed by urinary tract infection and viral upper respiratory tract infection. A total of 39 SAEs were reported in 28 (35%) patients; 16 (41%) patients in the placebo group and 12 (29%) in the astegolimab group. The most commonly reported SAE was hospital admission for community acquired pneumonia. Four SAEs resulted in patient discontinuation from treatment (1 patient in the placebo group and 3 patients in the astegolimab group). One patient in the placebo group experienced an AESI of potential MACE (heart failure, unrelated to the study treatment). No anaphylaxis or pregnancies were reported. Two deaths, unrelated to study treatment, were reported: 1 patient in the placebo group died after hospital acquired pneumonia, and another patient in the placebo group died after pneumonia and type 2 respiratory failure.  Safety Data: Astegolimab has been generally well tolerated. There have been 49 patient deaths across all astegolimab studies (Section 5.5.2), none of which were considered related to astegolimab. A total of 144 subjects experienced a total of 224 SAEs across all astegolimab studies. Of these, an SAE livedo reticularis observed in 1 patient was considered related to astegolimab by the investigator (Section 5.5.3). AEs leading to withdrawal (Section 5.5.4) have generally occurred at a rate similar to what would be expected for clinical trials in the studies' respective indications.   [facility] [place] Clinical Research Coordinator note :    This visit for  Subject [id number] with DOB: [birth year] on 07 Oct 2023 for the above protocol is Visit/Encounter #  and is for purpose of research.    The Subject expressed continued interest and consent in continuing as a study subject. Subject confirmed that there was no change in contact information (e.g. address, telephone, email). Subject thanked for participation in research and contribution to science.   The Subject was informed that the PI Dr. Marchelle Gearing continues to have oversight of the subject's visits and course  through relevant discussions, reviews and also specifically of this visit by routing of this note to the PI.  [facility]  Clinical Research Coordinator note:   This visit for Subject VERTIS BAUDER with DOB: [birth year] on 10/07/2023 for the above protocol is Visit/Encounter # week 20  and is for purpose of research.   Subject  expressed continued interest and consent in continuing as a study subject. Subject confirmed that there was  no  change in contact information (e.g. address, telephone, email). Subject thanked for participation in research and contribution to science. The Subject was yes (YES) informed that the PI Murali Ramaswamy,MD continues to have oversight of the subject's visits and course through relevant discussions, reviews, and also specifically of this visit by routing of this note to the PI. All assessments and procedures were performed per above stated protocol. Subject tolerated injections well. Please refer to subjects paper source binder for further details of this visit.     Signed by  Laurelyn Sickle  Clinical Research Coordinator / Nurse Judie Bon[place], [place] 1:27 PM 10/07/2023

## 2023-10-11 DIAGNOSIS — Z1212 Encounter for screening for malignant neoplasm of rectum: Secondary | ICD-10-CM | POA: Diagnosis not present

## 2023-10-11 DIAGNOSIS — I10 Essential (primary) hypertension: Secondary | ICD-10-CM | POA: Diagnosis not present

## 2023-10-11 DIAGNOSIS — E785 Hyperlipidemia, unspecified: Secondary | ICD-10-CM | POA: Diagnosis not present

## 2023-10-19 DIAGNOSIS — F5104 Psychophysiologic insomnia: Secondary | ICD-10-CM | POA: Diagnosis not present

## 2023-10-19 DIAGNOSIS — G252 Other specified forms of tremor: Secondary | ICD-10-CM | POA: Diagnosis not present

## 2023-10-19 DIAGNOSIS — Z1331 Encounter for screening for depression: Secondary | ICD-10-CM | POA: Diagnosis not present

## 2023-10-19 DIAGNOSIS — I10 Essential (primary) hypertension: Secondary | ICD-10-CM | POA: Diagnosis not present

## 2023-10-19 DIAGNOSIS — Z1339 Encounter for screening examination for other mental health and behavioral disorders: Secondary | ICD-10-CM | POA: Diagnosis not present

## 2023-10-19 DIAGNOSIS — K219 Gastro-esophageal reflux disease without esophagitis: Secondary | ICD-10-CM | POA: Diagnosis not present

## 2023-10-19 DIAGNOSIS — J9611 Chronic respiratory failure with hypoxia: Secondary | ICD-10-CM | POA: Diagnosis not present

## 2023-10-19 DIAGNOSIS — E785 Hyperlipidemia, unspecified: Secondary | ICD-10-CM | POA: Diagnosis not present

## 2023-10-19 DIAGNOSIS — R82998 Other abnormal findings in urine: Secondary | ICD-10-CM | POA: Diagnosis not present

## 2023-10-19 DIAGNOSIS — Z0000 Encounter for general adult medical examination without abnormal findings: Secondary | ICD-10-CM | POA: Diagnosis not present

## 2023-10-19 DIAGNOSIS — I2781 Cor pulmonale (chronic): Secondary | ICD-10-CM | POA: Diagnosis not present

## 2023-10-19 DIAGNOSIS — F329 Major depressive disorder, single episode, unspecified: Secondary | ICD-10-CM | POA: Diagnosis not present

## 2023-10-21 ENCOUNTER — Encounter: Payer: Medicare HMO | Admitting: *Deleted

## 2023-10-21 DIAGNOSIS — J449 Chronic obstructive pulmonary disease, unspecified: Secondary | ICD-10-CM

## 2023-10-21 MED ORDER — STUDY - ARNASA GB43374 (OPEN-LABEL) - ASTEGOLIMAB 238 MG/1.7 ML SQ INJECTION (PI-RAMASWAMY)
476.0000 mg | INJECTION | SUBCUTANEOUS | Status: AC
  Administered 2023-10-21: 476 mg via SUBCUTANEOUS
  Filled 2023-10-21: qty 3.4

## 2023-10-21 NOTE — Research (Signed)
 Title: A phase III Open-Label extension study to evaluate the long-term safety of Astegolimab in patiens with Chronic Obstructive Pulmonary disease.   Dose and Duration of Treatment: Astegolimab is presented as a sterile, slightly brown-yellow solution. Each single-use, 2.25 mL pre-filled syringe contains 1.7 mL deliverable volume. Astegolimab drug product is formulated at 140 mg/mL astegolimab with 114 mM succinic acid, 200 mM L-arginine, 10 mM L-methionine, 0.06% (w/v) polysorbate 20, pH 5.7. Placebo for astegolimab is supplied in an identical pre-filled syringe configuration.   Protocol # [id number] Sponsor: [employer] [address] [place]  Protocol Version: v3 dated 15Oct2024 IB: version 9.0 dated April 2024 ICF:  ICFs: Make sure that the subject is re-consented with the updated ICFs Main ICF: [facility] Approved Version 01 Jul 2023, Revised 28Nov2024 Mobile Nursing: [facility] Approved Version 26Oct2023, Revised 08Aug2024 Optional Home Study Treatment: IRB Approved Version 28 May 2022 Revised 11 Mar 2023 ADDENDUM 1: IRB Approved Version 24Jul2024 Revised 8Aug2024 Lab Manual: Version 2.1.0 dated 07Mar2023   Mechanism of action: Astegolimab (also known as [id number] or [id number]) is a fully human, IgG2 monoclonal antibody that binds with high affinity to the interleukin (IL)-33 (IL-33) receptor, ST2, thereby blocking the signaling of IL-33, an inflammatory cytokine of the IL-1 family and member of the "alarmin" class of molecules. Astegolimab binds with high affinity to the human and cynomolgus monkey receptor for IL-33, ST2, and blocks IL-33 binding, thus inhibiting association with the IL-1R accessory protein (AcP) co-receptor and formation of an activated receptor complex.   Key Inclusion Criteria: Able and willing to provide written informed consent and to comply with the study protocol. Ability to comply with the requirements of study protocol,  according to the investigators's best judgement. Completion of the 52-week treatment period in either [place] or Study [id number].  Key Exclusion Criteria: Significant non- compliance in the parent study, specifically defined as missing scheduled visits, per investigators judgement. Any new clinically significant pulmonary disease other than COPD since enrolling in the study. Unstable cardiac disease, myocardial infarction, or New York Heart Association Class III or IV heart failure since enrolling in the parent study.      Integrated Pharmacokinetic/Pharmacodynamic Analysis:  In Studies [id number], [id number], and [id number], exploratory biomarker analysis showed there were consistent decreases in blood eosinophil counts throughout the treatment period, potentially mediated by a direct effect of IL-33 on eosinophil progenitors. In [place], there was no significant difference in fractional exhaled nitric oxide (FeNO) levels (reflecting airway IL-4/IL-13 activity) between astegolimab-treated groups relative to placebo throughout the treatment period. These data suggest that astegolimab has only a limited effect on Type 2 inflammation in asthma. No data are applicable for Study [id number]. No data are available yet for Study [id number].  Special Warnings/Considerations: Administration of astegolimab, a protein therapeutic, may lead to the development of anti-astegolimab antibodies which could lead to AEs and/or decreased exposure. In non-clinical studies (see Section 4.2), ADA incidence has generally been low and there was no apparent ADA impact on PK and safety in these studies. To date, the immunogenicity rates observed with astegolimab in clinical studies have been relatively low as well (see Immunogenicity Section 5.6). Several clinical studies have been conducted in patients with asthma, atopic dermatitis, COPD (IIS Study [id number]) and COVID-19 severe pneumonia, which generally showed low incidence of ADAs  (Table 31). There has been no correlation between ADA status and clinical findings or increased incidence of AEs. Astegolimab is now being considered in a larger study for the treatment  of COPD. This patient population is typically considered to have a hyper-responsive immune system. Route of administration for this molecule will be [place], either Q2W or Q4W. These factors increase the risk of development of an immune response to astegolimab, specifically with repeat dosing. From the previous IIS Study [id number], incidence of ADAs was low in COPD patients; this remains to be confirmed in a larger study. To monitor ADA development in ongoing studies, serum samples will be collected from patients at protocol-defined intervals. Patients who test positive for antibodies and have clinical sequelae that are considered potentially related to an ADA response may also be asked to return for additional follow-up testing.  Drug Interaction Studies: No PK drug interaction studies have been conducted to date.  Serious Adverse Reactions Observed in Asthma Study: During the treatment period, a similar proportion of patients across all cohorts experienced at least one AE, regardless of causality (Table 22). In total, 77.2%, 70.9%, 72.2%, and 72.1% of patients reported at least 1 AE in the placebo, 70 mg, 210 mg, and 490 mg groups, respectively. The most common AE's (>5% in any treatment group) were asthma, nasopharyngitis, upper respiratory tract infection, headache, and injection site reaction (ISR). The most common drug-related AE was ISR, which was reported more frequently in the astegolimab treatment groups than in the placebo group (1 [0.8%] patient in the placebo group, 10 [7.9%] patients with 70 mg, 8 [6.3%] patients with 210 mg, 6 [4.9%] patients with 490 mg). All ISRs were non-serious and mild or moderate in severity. During the treatment period, 50 SAEs were reported in 37 (7.4%) patients. The number of patients reporting  SAEs was comparable across all cohorts (11 SAEs in 8 patients on placebo, 21 SAEs in 14 patients on 70 mg, 11 SAEs in 9 patients on 210 mg, and 7 SAEs in 6 patients on 490 mg). The most common SAE was asthma. One SAE of moderate livedo reticularis (70 mg) was considered a suspected unexpected serious adverse drug reaction (SUSAR) related to astegolimab and was reported two days after the second dose, leading to discontinuation of astegolimab. Two (0.4%) patients reported anaphylaxis and hypersensitivity reactions: 1 severe SAE of anaphylactic reaction (placebo), and 1 moderate hypersensitivity (490 mg) considered related to astegolimab. Three (0.6%) patients experienced a potential Major Adverse Cardiac Event (MACE) (1 patient each in the placebo, 70 mg, and 210 mg groups). None of the potential MACE were considered related to astegolimab. Overall, 233 (46.4%) patients reported events of infection. A comparable number of patients reported infection across all treatment groups (65 [51.2%] patients on placebo, 55 [43.3%] patients on 70 mg, 58 [46.0%] patients on 210 mg, and 55 [45.1%] patients on 490 mg). The most frequently reported infection (>=10% incidence) was nasopharyngitis (12.7%). One patient on astegolimab 210 mg and the partner of one patient on placebo became pregnant during the study. Both delivered normal/healthy babies. Two deaths, unrelated to study drug, were reported: one patient on 210 mg astegolimab died following an SAE of asthma; the other patient on 490 mg astegolimab had an unexplained death. There were no clinically meaningful changes in laboratory parameters, vital signs, or ECG results, other than the 10% decrease in mean blood eosinophil counts in the astegolimab-treated groups, with no safety concerns. Treatment-induced ADAs were comparable between the astegolimab groups and had no impact on safety. Overall, astegolimab was well tolerated at all doses used and had a safety profile  consistent with that observed in the previous astegolimab Phase I studies.  Serious Adverse Reactions  Observed in Previous COPD Study: In the completed IIS Study [id number], a total of 81 patients received at least one dose of astegolimab or placebo. The safety profile of astegolimab was similar to that of placebo. There were a total of 222 AEs reported in 62 patients. A total of 28 (72%) patients in the placebo arm and 34 (81%) patients in the astegolimab arm reported at least one AE. The most commonly reported AEs were headache followed by urinary tract infection and viral upper respiratory tract infection. A total of 39 SAEs were reported in 28 (35%) patients; 16 (41%) patients in the placebo group and 12 (29%) in the astegolimab group. The most commonly reported SAE was hospital admission for community acquired pneumonia. Four SAEs resulted in patient discontinuation from treatment (1 patient in the placebo group and 3 patients in the astegolimab group). One patient in the placebo group experienced an AESI of potential MACE (heart failure, unrelated to the study treatment). No anaphylaxis or pregnancies were reported. Two deaths, unrelated to study treatment, were reported: 1 patient in the placebo group died after hospital acquired pneumonia, and another patient in the placebo group died after pneumonia and type 2 respiratory failure.  Safety Data: Astegolimab has been generally well tolerated. There have been 49 patient deaths across all astegolimab studies (Section 5.5.2), none of which were considered related to astegolimab. A total of 144 subjects experienced a total of 224 SAEs across all astegolimab studies. Of these, an SAE livedo reticularis observed in 1 patient was considered related to astegolimab by the investigator (Section 5.5.3). AEs leading to withdrawal (Section 5.5.4) have generally occurred at a rate similar to what would be expected for clinical trials in the studies' respective  indications.   [facility] [place] Clinical Research Coordinator note :    This visit for Subject [id number] with DOB: 16-Aug-1955 on 21 Oct 2023 for the above protocol is Visit/Encounter #   week 22 and is for purpose of research.    Subject expressed continued interest and consent in continuing as a study subject. Subject confirmed that there was no change in contact information (e.g. address, telephone, email). Subject thanked for participation in research and contribution to science.    The Subject was informed that the PI Dr. Marchelle Gearing continues to have oversight of the subject's visits and course  through relevant discussions, reviews and also specifically of this visit by routing of this note to the PI. [facility] [place] Clinical Research Coordinator note:   This visit for Subject DYNISHA DUE with DOB: May 29, 1956 on 10/21/2023 for the above protocol is Visit/Encounter # week 22  and is for purpose of research.   Subject  expressed continued interest and consent in continuing as a study subject. Subject confirmed that there was  no  change in contact information (e.g. address, telephone, email). Subject thanked for participation in research and contribution to science.The Subject was yes, informed that the PI Murali Ramaswamy,MD continues to have oversight of the subject's visits and course through relevant discussions, reviews, and also specifically of this visit by routing of this note to the PI.    Signed by  Laurelyn Sickle  Clinical Research Coordinator / Nurse [facility]  [place], [place] 1:22 PM 10/21/2023

## 2023-11-04 ENCOUNTER — Encounter: Admitting: *Deleted

## 2023-11-04 DIAGNOSIS — J449 Chronic obstructive pulmonary disease, unspecified: Secondary | ICD-10-CM

## 2023-11-04 MED ORDER — STUDY - ARNASA GB43374 (OPEN-LABEL) - ASTEGOLIMAB 238 MG/1.7 ML SQ INJECTION (PI-RAMASWAMY)
476.0000 mg | INJECTION | SUBCUTANEOUS | Status: AC
  Administered 2023-11-04: 476 mg via SUBCUTANEOUS
  Filled 2023-11-04: qty 3.4

## 2023-11-04 NOTE — Research (Signed)
 Title: A phase III Open-Label extension study to evaluate the long-term safety of Astegolimab in patiens with Chronic Obstructive Pulmonary disease.   Dose and Duration of Treatment: Astegolimab is presented as a sterile, slightly brown-yellow solution. Each single-use, 2.25 mL pre-filled syringe contains 1.7 mL deliverable volume. Astegolimab drug product is formulated at 140 mg/mL astegolimab with 114 mM succinic acid, 200 mM L-arginine, 10 mM L-methionine, 0.06% (w/v) polysorbate 20, pH 5.7. Placebo for astegolimab is supplied in an identical pre-filled syringe configuration.   Protocol # [id number] Sponsor: [employer] [address] [place] Protocol Version: v3 dated 15Oct2024 IB: version 9.0 dated April 2024 ICF:  ICFs: Make sure that the subject is re-consented with the updated ICFs Main ICF: [employer] IR[facility] Approved Version 01 Jul 2023, Revised 28Nov2024 Mobile Nursing: [employer] IRB Approved Version 26Oct2023, Revised 08Aug2024 Optional Home Study Treatment: IRB Approved Version 28 May 2022 Revised 11 Mar 2023 ADDENDUM 1: IRB Approved Version 24Jul2024 Revised 8Aug2024 Lab Manual: V2.1.0 dated 21Jun2024   Mechanism of action: Astegolimab (also known as [id number] or [id number]) is a fully human, IgG2 monoclonal antibody that binds with high affinity to the interleukin (IL)-33 (IL-33) receptor, ST2, thereby blocking the signaling of IL-33, an inflammatory cytokine of the IL-1 family and member of the "alarmin" class of molecules. Astegolimab binds with high affinity to the human and cynomolgus monkey receptor for IL-33, ST2, and blocks IL-33 binding, thus inhibiting association with the IL-1R accessory protein (AcP) co-receptor and formation of an activated receptor complex.   Key Inclusion Criteria: Able and willing to provide written informed consent and to comply with the study protocol. Ability to comply with the requirements of study protocol, according to  the investigators's best judgement. Completion of the 52-week treatment period in either [place] or Study [id number].  Key Exclusion Criteria: Significant non- compliance in the parent study, specifically defined as missing scheduled visits, per investigators judgement. Any new clinically significant pulmonary disease other than COPD since enrolling in the study. Unstable cardiac disease, myocardial infarction, or New York Heart Association Class III or IV heart failure since enrolling in the parent study.      Integrated Pharmacokinetic/Pharmacodynamic Analysis:  In Studies [id number], [id number], and [id number], exploratory biomarker analysis showed there were consistent decreases in blood eosinophil counts throughout the treatment period, potentially mediated by a direct effect of IL-33 on eosinophil progenitors. In [place], there was no significant difference in fractional exhaled nitric oxide (FeNO) levels (reflecting airway IL-4/IL-13 activity) between astegolimab-treated groups relative to placebo throughout the treatment period. These data suggest that astegolimab has only a limited effect on Type 2 inflammation in asthma. No data are applicable for Study [id number]. No data are available yet for Study [id number].  Special Warnings/Considerations: Administration of astegolimab, a protein therapeutic, may lead to the development of anti-astegolimab antibodies which could lead to AEs and/or decreased exposure. In non-clinical studies (see Section 4.2), ADA incidence has generally been low and there was no apparent ADA impact on PK and safety in these studies. To date, the immunogenicity rates observed with astegolimab in clinical studies have been relatively low as well (see Immunogenicity Section 5.6). Several clinical studies have been conducted in patients with asthma, atopic dermatitis, COPD (IIS Study [id number]) and COVID-19 severe pneumonia, which generally showed low incidence of ADAs (Table 31).  There has been no correlation between ADA status and clinical findings or increased incidence of AEs. Astegolimab is now being considered in a larger study for the treatment of COPD.  This patient population is typically considered to have a hyper-responsive immune system. Route of administration for this molecule will be [place], either Q2W or Q4W. These factors increase the risk of development of an immune response to astegolimab, specifically with repeat dosing. From the previous IIS Study ZO10960, incidence of ADAs was low in COPD patients; this remains to be confirmed in a larger study. To monitor ADA development in ongoing studies, serum samples will be collected from patients at protocol-defined intervals. Patients who test positive for antibodies and have clinical sequelae that are considered potentially related to an ADA response may also be asked to return for additional follow-up testing.  Drug Interaction Studies: No PK drug interaction studies have been conducted to date.  Serious Adverse Reactions Observed in Asthma Study: During the treatment period, a similar proportion of patients across all cohorts experienced at least one AE, regardless of causality (Table 22). In total, 77.2%, 70.9%, 72.2%, and 72.1% of patients reported at least 1 AE in the placebo, 70 mg, 210 mg, and 490 mg groups, respectively. The most common AE's (>5% in any treatment group) were asthma, nasopharyngitis, upper respiratory tract infection, headache, and injection site reaction (ISR). The most common drug-related AE was ISR, which was reported more frequently in the astegolimab treatment groups than in the placebo group (1 [0.8%] patient in the placebo group, 10 [7.9%] patients with 70 mg, 8 [6.3%] patients with 210 mg, 6 [4.9%] patients with 490 mg). All ISRs were non-serious and mild or moderate in severity. During the treatment period, 50 SAEs were reported in 37 (7.4%) patients. The number of patients reporting SAEs was  comparable across all cohorts (11 SAEs in 8 patients on placebo, 21 SAEs in 14 patients on 70 mg, 11 SAEs in 9 patients on 210 mg, and 7 SAEs in 6 patients on 490 mg). The most common SAE was asthma. One SAE of moderate livedo reticularis (70 mg) was considered a suspected unexpected serious adverse drug reaction (SUSAR) related to astegolimab and was reported two days after the second dose, leading to discontinuation of astegolimab. Two (0.4%) patients reported anaphylaxis and hypersensitivity reactions: 1 severe SAE of anaphylactic reaction (placebo), and 1 moderate hypersensitivity (490 mg) considered related to astegolimab. Three (0.6%) patients experienced a potential Major Adverse Cardiac Event (MACE) (1 patient each in the placebo, 70 mg, and 210 mg groups). None of the potential MACE were considered related to astegolimab. Overall, 233 (46.4%) patients reported events of infection. A comparable number of patients reported infection across all treatment groups (65 [51.2%] patients on placebo, 55 [43.3%] patients on 70 mg, 58 [46.0%] patients on 210 mg, and 55 [45.1%] patients on 490 mg). The most frequently reported infection (>=10% incidence) was nasopharyngitis (12.7%). One patient on astegolimab 210 mg and the partner of one patient on placebo became pregnant during the study. Both delivered normal/healthy babies. Two deaths, unrelated to study drug, were reported: one patient on 210 mg astegolimab died following an SAE of asthma; the other patient on 490 mg astegolimab had an unexplained death. There were no clinically meaningful changes in laboratory parameters, vital signs, or ECG results, other than the 10% decrease in mean blood eosinophil counts in the astegolimab-treated groups, with no safety concerns. Treatment-induced ADAs were comparable between the astegolimab groups and had no impact on safety. Overall, astegolimab was well tolerated at all doses used and had a safety profile consistent with  that observed in the previous astegolimab Phase I studies.  Serious Adverse Reactions Observed in  Previous COPD Study: In the completed IIS Study [id number], a total of 81 patients received at least one dose of astegolimab or placebo. The safety profile of astegolimab was similar to that of placebo. There were a total of 222 AEs reported in 62 patients. A total of 28 (72%) patients in the placebo arm and 34 (81%) patients in the astegolimab arm reported at least one AE. The most commonly reported AEs were headache followed by urinary tract infection and viral upper respiratory tract infection. A total of 39 SAEs were reported in 28 (35%) patients; 16 (41%) patients in the placebo group and 12 (29%) in the astegolimab group. The most commonly reported SAE was hospital admission for community acquired pneumonia. Four SAEs resulted in patient discontinuation from treatment (1 patient in the placebo group and 3 patients in the astegolimab group). One patient in the placebo group experienced an AESI of potential MACE (heart failure, unrelated to the study treatment). No anaphylaxis or pregnancies were reported. Two deaths, unrelated to study treatment, were reported: 1 patient in the placebo group died after hospital acquired pneumonia, and another patient in the placebo group died after pneumonia and type 2 respiratory failure.  Safety Data: Astegolimab has been generally well tolerated. There have been 49 patient deaths across all astegolimab studies (Section 5.5.2), none of which were considered related to astegolimab. A total of 144 subjects experienced a total of 224 SAEs across all astegolimab studies. Of these, an SAE livedo reticularis observed in 1 patient was considered related to astegolimab by the investigator (Section 5.5.3). AEs leading to withdrawal (Section 5.5.4) have generally occurred at a rate similar to what would be expected for clinical trials in the studies' respective indications.   [facility]  [place] Clinical Research Coordinator note :    This visit for Subject [id number] with DOB: 03-15-1956 on 04 Nov 2023 for the above protocol is Visit/Encounter # week 24 and is for purpose of research.    The consent for this encounter is under Protocol Version 2.0 , Investigator Brochure Version 7, Consent Version IRB Approved  revised  and is currently IRB approved.    Subject expressed continued interest and consent in continuing as a study subject. Subject confirmed that there was no change in contact information (e.g. address, telephone, email). Subject thanked for participation in research and contribution to science.  In this visit week 24 the subject will be evaluated by Erven Colla research coordinator has verified that the above investigator is up to date with his/her training logs.    The Subject was informed that the PI Dr. Marchelle Gearing continues to have oversight of the subject's visits and course  through relevant discussions, reviews and also specifically of this visit by routing of this note to the PI. All assessments and procedures were performed per above stated protocol. Subject tolerated injections well. Please refer to subjects paper source binder for further details of this visit.      Signed by Laurelyn Sickle Clinical Research Coordinator  [facility]  [place], [place]

## 2023-11-08 NOTE — Progress Notes (Signed)
 Velarde Cellar Raul Del , DOB was seen as subject in a clinical trial /Protocol # [id number]. Cardiopulmonary symptoms are stable Other or new symptoms denied. Pertinent physical findings include: Slight splitting of S1.  Breath sounds are distant with minimal rales RLL.  1/2+ edema at the sock line. All physical findings NCS                                                                     Pecola Lawless MD,SI

## 2023-11-16 DIAGNOSIS — E1136 Type 2 diabetes mellitus with diabetic cataract: Secondary | ICD-10-CM | POA: Diagnosis not present

## 2023-11-16 DIAGNOSIS — H524 Presbyopia: Secondary | ICD-10-CM | POA: Diagnosis not present

## 2023-11-16 DIAGNOSIS — Z9981 Dependence on supplemental oxygen: Secondary | ICD-10-CM | POA: Diagnosis not present

## 2023-11-16 DIAGNOSIS — H2513 Age-related nuclear cataract, bilateral: Secondary | ICD-10-CM | POA: Diagnosis not present

## 2023-11-16 DIAGNOSIS — H25041 Posterior subcapsular polar age-related cataract, right eye: Secondary | ICD-10-CM | POA: Diagnosis not present

## 2023-11-16 DIAGNOSIS — H25042 Posterior subcapsular polar age-related cataract, left eye: Secondary | ICD-10-CM | POA: Diagnosis not present

## 2023-11-16 DIAGNOSIS — H25813 Combined forms of age-related cataract, bilateral: Secondary | ICD-10-CM | POA: Diagnosis not present

## 2023-11-16 DIAGNOSIS — J449 Chronic obstructive pulmonary disease, unspecified: Secondary | ICD-10-CM | POA: Diagnosis not present

## 2023-11-16 DIAGNOSIS — H5203 Hypermetropia, bilateral: Secondary | ICD-10-CM | POA: Diagnosis not present

## 2023-11-17 ENCOUNTER — Encounter: Admitting: *Deleted

## 2023-11-17 DIAGNOSIS — J449 Chronic obstructive pulmonary disease, unspecified: Secondary | ICD-10-CM

## 2023-11-17 MED ORDER — STUDY - ARNASA GB43374 (OPEN-LABEL) - ASTEGOLIMAB 238 MG/1.7 ML SQ INJECTION (PI-RAMASWAMY)
476.0000 mg | INJECTION | SUBCUTANEOUS | Status: AC
  Administered 2023-11-17: 476 mg via SUBCUTANEOUS
  Filled 2023-11-17: qty 3.4

## 2023-11-17 NOTE — Research (Signed)
 Title: A phase III Open-Label extension study to evaluate the long-term safety of Astegolimab in patiens with Chronic Obstructive Pulmonary disease.   Dose and Duration of Treatment: Astegolimab is presented as a sterile, slightly brown-yellow solution. Each single-use, 2.25 mL pre-filled syringe contains 1.7 mL deliverable volume. Astegolimab drug product is formulated at 140 mg/mL astegolimab with 114 mM succinic acid, 200 mM L-arginine, 10 mM L-methionine, 0.06% (w/v) polysorbate 20, pH 5.7. Placebo for astegolimab is supplied in an identical pre-filled syringe configuration.   Protocol # [id number]  Sponsor: [employer] [address] [place] Protocol Version: v3 dated 15Oct2024 IB: version 9.0 dated April 2024 ICF:  ICFs: Make sure that the subject is re-consented with the updated ICFs Main ICF: [facility] IRB Approved Version 01 Jul 2023, Revised 28Nov2024 Mobile Nursing: [facility] IR[facility] Approved Version 26Oct2023, Revised 08Aug2024 Optional Home Study Treatment: IRB Approved Version 28 May 2022 Revised 11 Mar 2023 ADDENDUM 1: IRB Approved Version 24Jul2024 Revised 8Aug2024 Lab Manual: V2.1.0 dated 21Jun2024      Mechanism of action: Astegolimab (also known as [id number] or [id number]) is a fully human, IgG2 monoclonal antibody that binds with high affinity to the interleukin (IL)-33 (IL-33) receptor, ST2, thereby blocking the signaling of IL-33, an inflammatory cytokine of the IL-1 family and member of the "alarmin" class of molecules. Astegolimab binds with high affinity to the human and cynomolgus monkey receptor for IL-33, ST2, and blocks IL-33 binding, thus inhibiting association with the IL-1R accessory protein (AcP) co-receptor and formation of an activated receptor complex.   Key Inclusion Criteria: Able and willing to provide written informed consent and to comply with the study protocol. Ability to comply with the requirements of study protocol,  according to the investigators's best judgement. Completion of the 52-week treatment period in either [place] or Study [id number].  Key Exclusion Criteria: Significant non- compliance in the parent study, specifically defined as missing scheduled visits, per investigators judgement. Any new clinically significant pulmonary disease other than COPD since enrolling in the study. Unstable cardiac disease, myocardial infarction, or [place]  Heart Association Class III or IV heart failure since enrolling in the parent study.      Integrated Pharmacokinetic/Pharmacodynamic Analysis:  In Studies [id number], [id number], and [id number], exploratory biomarker analysis showed there were consistent decreases in blood eosinophil counts throughout the treatment period, potentially mediated by a direct effect of IL-33 on eosinophil progenitors. In [place], there was no significant difference in fractional exhaled nitric oxide (FeNO) levels (reflecting airway IL-4/IL-13 activity) between astegolimab-treated groups relative to placebo throughout the treatment period. These data suggest that astegolimab has only a limited effect on Type 2 inflammation in asthma. No data are applicable for Study [id number]. No data are available yet for Study [id number].  Special Warnings/Considerations: Administration of astegolimab, a protein therapeutic, may lead to the development of anti-astegolimab antibodies which could lead to AEs and/or decreased exposure. In non-clinical studies (see Section 4.2), ADA incidence has generally been low and there was no apparent ADA impact on PK and safety in these studies. To date, the immunogenicity rates observed with astegolimab in clinical studies have been relatively low as well (see Immunogenicity Section 5.6). Several clinical studies have been conducted in patients with asthma, atopic dermatitis, COPD (IIS Study QQ59563) and COVID-19 severe pneumonia, which generally showed low incidence of ADAs  (Table 31). There has been no correlation between ADA status and clinical findings or increased incidence of AEs. Astegolimab is now being considered in a larger study for  the treatment of COPD. This patient population is typically considered to have a hyper-responsive immune system. Route of administration for this molecule will be [place], either Q2W or Q4W. These factors increase the risk of development of an immune response to astegolimab, specifically with repeat dosing. From the previous IIS Study ZO10960, incidence of ADAs was low in COPD patients; this remains to be confirmed in a larger study. To monitor ADA development in ongoing studies, serum samples will be collected from patients at protocol-defined intervals. Patients who test positive for antibodies and have clinical sequelae that are considered potentially related to an ADA response may also be asked to return for additional follow-up testing.  Drug Interaction Studies: No PK drug interaction studies have been conducted to date.  Serious Adverse Reactions Observed in Asthma Study: During the treatment period, a similar proportion of patients across all cohorts experienced at least one AE, regardless of causality (Table 22). In total, 77.2%, 70.9%, 72.2%, and 72.1% of patients reported at least 1 AE in the placebo, 70 mg, 210 mg, and 490 mg groups, respectively. The most common AE's (>5% in any treatment group) were asthma, nasopharyngitis, upper respiratory tract infection, headache, and injection site reaction (ISR). The most common drug-related AE was ISR, which was reported more frequently in the astegolimab treatment groups than in the placebo group (1 [0.8%] patient in the placebo group, 10 [7.9%] patients with 70 mg, 8 [6.3%] patients with 210 mg, 6 [4.9%] patients with 490 mg). All ISRs were non-serious and mild or moderate in severity. During the treatment period, 50 SAEs were reported in 37 (7.4%) patients. The number of patients reporting  SAEs was comparable across all cohorts (11 SAEs in 8 patients on placebo, 21 SAEs in 14 patients on 70 mg, 11 SAEs in 9 patients on 210 mg, and 7 SAEs in 6 patients on 490 mg). The most common SAE was asthma. One SAE of moderate livedo reticularis (70 mg) was considered a suspected unexpected serious adverse drug reaction (SUSAR) related to astegolimab and was reported two days after the second dose, leading to discontinuation of astegolimab. Two (0.4%) patients reported anaphylaxis and hypersensitivity reactions: 1 severe SAE of anaphylactic reaction (placebo), and 1 moderate hypersensitivity (490 mg) considered related to astegolimab. Three (0.6%) patients experienced a potential Major Adverse Cardiac Event (MACE) (1 patient each in the placebo, 70 mg, and 210 mg groups). None of the potential MACE were considered related to astegolimab. Overall, 233 (46.4%) patients reported events of infection. A comparable number of patients reported infection across all treatment groups (65 [51.2%] patients on placebo, 55 [43.3%] patients on 70 mg, 58 [46.0%] patients on 210 mg, and 55 [45.1%] patients on 490 mg). The most frequently reported infection (>=10% incidence) was nasopharyngitis (12.7%). One patient on astegolimab 210 mg and the partner of one patient on placebo became pregnant during the study. Both delivered normal/healthy babies. Two deaths, unrelated to study drug, were reported: one patient on 210 mg astegolimab died following an SAE of asthma; the other patient on 490 mg astegolimab had an unexplained death. There were no clinically meaningful changes in laboratory parameters, vital signs, or ECG results, other than the 10% decrease in mean blood eosinophil counts in the astegolimab-treated groups, with no safety concerns. Treatment-induced ADAs were comparable between the astegolimab groups and had no impact on safety. Overall, astegolimab was well tolerated at all doses used and had a safety profile  consistent with that observed in the previous astegolimab Phase I studies.  Serious  Adverse Reactions Observed in Previous COPD Study: In the completed IIS Study [id number], a total of 81 patients received at least one dose of astegolimab or placebo. The safety profile of astegolimab was similar to that of placebo. There were a total of 222 AEs reported in 62 patients. A total of 28 (72%) patients in the placebo arm and 34 (81%) patients in the astegolimab arm reported at least one AE. The most commonly reported AEs were headache followed by urinary tract infection and viral upper respiratory tract infection. A total of 39 SAEs were reported in 28 (35%) patients; 16 (41%) patients in the placebo group and 12 (29%) in the astegolimab group. The most commonly reported SAE was hospital admission for community acquired pneumonia. Four SAEs resulted in patient discontinuation from treatment (1 patient in the placebo group and 3 patients in the astegolimab group). One patient in the placebo group experienced an AESI of potential MACE (heart failure, unrelated to the study treatment). No anaphylaxis or pregnancies were reported. Two deaths, unrelated to study treatment, were reported: 1 patient in the placebo group died after hospital acquired pneumonia, and another patient in the placebo group died after pneumonia and type 2 respiratory failure.  Safety Data: Astegolimab has been generally well tolerated. There have been 49 patient deaths across all astegolimab studies (Section 5.5.2), none of which were considered related to astegolimab. A total of 144 subjects experienced a total of 224 SAEs across all astegolimab studies. Of these, an SAE livedo reticularis observed in 1 patient was considered related to astegolimab by the investigator (Section 5.5.3). AEs leading to withdrawal (Section 5.5.4) have generally occurred at a rate similar to what would be expected for clinical trials in the studies' respective  indications.   [facility] [place] Clinical Research Coordinator note :    This visit for Subject [id number] with DOB: [birth year] on 17 Nov 2023 for the above protocol is Visit/Encounter # week 26 and is for purpose of research.    Subject expressed continued interest and consent in continuing as a study subject. Subject confirmed that there was no change in contact information (e.g. address, telephone, email). Subject thanked for participation in research and contribution to science.  The Subject was informed that the PI Dr. Bertrum Brodie continues to have oversight of the subject's visits and course  through relevant discussions, reviews and also specifically of this visit by routing of this note to the PI. All procedures and assessments were performed per above stated protocol. Subject tolerated injection well. Please refer to subjects paper source binder for further details of this visit.     Signed by Dara Ear Clinical Research Coordinator  [facility]  [place], [place]

## 2023-11-18 ENCOUNTER — Encounter

## 2023-12-02 ENCOUNTER — Encounter: Admitting: *Deleted

## 2023-12-02 DIAGNOSIS — J449 Chronic obstructive pulmonary disease, unspecified: Secondary | ICD-10-CM

## 2023-12-02 MED ORDER — STUDY - ARNASA GB43374 (OPEN-LABEL) - ASTEGOLIMAB 238 MG/1.7 ML SQ INJECTION (PI-RAMASWAMY)
476.0000 mg | INJECTION | SUBCUTANEOUS | Status: AC
  Administered 2023-12-02: 476 mg via SUBCUTANEOUS
  Filled 2023-12-02: qty 3.4

## 2023-12-02 NOTE — Research (Signed)
 Title: A phase III Open-Label extension study to evaluate the long-term safety of Astegolimab in patiens with Chronic Obstructive Pulmonary disease.   Dose and Duration of Treatment: Astegolimab is presented as a sterile, slightly brown-yellow solution. Each single-use, 2.25 mL pre-filled syringe contains 1.7 mL deliverable volume. Astegolimab drug product is formulated at 140 mg/mL astegolimab with 114 mM succinic acid, 200 mM L-arginine, 10 mM L-methionine, 0.06% (w/v) polysorbate 20, pH 5.7. Placebo for astegolimab is supplied in an identical pre-filled syringe configuration.   Protocol # [id number] Sponsor: [employer] [address], [place]   Protocol Version: v3 dated 15Oct2024 IB: version 9.0 dated April 2024 ICF:  ICFs: Make sure that the subject is re-consented with the updated ICFs Main ICF: [employer] IR[facility] Approved Version 01 Jul 2023, Revised 28Nov2024 Mobile Nursing: [employer] IR[facility] Approved Version 26Oct2023, Revised 08Aug2024 Optional Home Study Treatment: IRB Approved Version 28 May 2022 Revised 11 Mar 2023 ADDENDUM 1: IRB Approved Version 24Jul2024 Revised 8Aug2024 Lab Manual: V2.1.0 dated 21Jun2024    Mechanism of action: Astegolimab (also known as [id number] or [id number]) is a fully human, IgG2 monoclonal antibody that binds with high affinity to the interleukin (IL)-33 (IL-33) receptor, ST2, thereby blocking the signaling of IL-33, an inflammatory cytokine of the IL-1 family and member of the "alarmin" class of molecules. Astegolimab binds with high affinity to the human and cynomolgus monkey receptor for IL-33, ST2, and blocks IL-33 binding, thus inhibiting association with the IL-1R accessory protein (AcP) co-receptor and formation of an activated receptor complex.   Key Inclusion Criteria: Able and willing to provide written informed consent and to comply with the study protocol. Ability to comply with the requirements of study protocol, according  to the investigators's best judgement. Completion of the 52-week treatment period in either [place] or Study [id number].  Key Exclusion Criteria: Significant non- compliance in the parent study, specifically defined as missing scheduled visits, per investigators judgement. Any new clinically significant pulmonary disease other than COPD since enrolling in the study. Unstable cardiac disease, myocardial infarction, or [place]  Heart Association Class III or IV heart failure since enrolling in the parent study.      Integrated Pharmacokinetic/Pharmacodynamic Analysis:  In Studies [id number], [id number], and [id number], exploratory biomarker analysis showed there were consistent decreases in blood eosinophil counts throughout the treatment period, potentially mediated by a direct effect of IL-33 on eosinophil progenitors. In [place], there was no significant difference in fractional exhaled nitric oxide (FeNO) levels (reflecting airway IL-4/IL-13 activity) between astegolimab-treated groups relative to placebo throughout the treatment period. These data suggest that astegolimab has only a limited effect on Type 2 inflammation in asthma. No data are applicable for Study [id number]. No data are available yet for Study [id number].  Special Warnings/Considerations: Administration of astegolimab, a protein therapeutic, may lead to the development of anti-astegolimab antibodies which could lead to AEs and/or decreased exposure. In non-clinical studies (see Section 4.2), ADA incidence has generally been low and there was no apparent ADA impact on PK and safety in these studies. To date, the immunogenicity rates observed with astegolimab in clinical studies have been relatively low as well (see Immunogenicity Section 5.6). Several clinical studies have been conducted in patients with asthma, atopic dermatitis, COPD (IIS Study GM01027) and COVID-19 severe pneumonia, which generally showed low incidence of ADAs (Table  31). There has been no correlation between ADA status and clinical findings or increased incidence of AEs. Astegolimab is now being considered in a larger study for the  treatment of COPD. This patient population is typically considered to have a hyper-responsive immune system. Route of administration for this molecule will be [place], either Q2W or Q4W. These factors increase the risk of development of an immune response to astegolimab, specifically with repeat dosing. From the previous IIS Study ZO10960, incidence of ADAs was low in COPD patients; this remains to be confirmed in a larger study. To monitor ADA development in ongoing studies, serum samples will be collected from patients at protocol-defined intervals. Patients who test positive for antibodies and have clinical sequelae that are considered potentially related to an ADA response may also be asked to return for additional follow-up testing.  Drug Interaction Studies: No PK drug interaction studies have been conducted to date.  Serious Adverse Reactions Observed in Asthma Study: During the treatment period, a similar proportion of patients across all cohorts experienced at least one AE, regardless of causality (Table 22). In total, 77.2%, 70.9%, 72.2%, and 72.1% of patients reported at least 1 AE in the placebo, 70 mg, 210 mg, and 490 mg groups, respectively. The most common AE's (>5% in any treatment group) were asthma, nasopharyngitis, upper respiratory tract infection, headache, and injection site reaction (ISR). The most common drug-related AE was ISR, which was reported more frequently in the astegolimab treatment groups than in the placebo group (1 [0.8%] patient in the placebo group, 10 [7.9%] patients with 70 mg, 8 [6.3%] patients with 210 mg, 6 [4.9%] patients with 490 mg). All ISRs were non-serious and mild or moderate in severity. During the treatment period, 50 SAEs were reported in 37 (7.4%) patients. The number of patients reporting SAEs  was comparable across all cohorts (11 SAEs in 8 patients on placebo, 21 SAEs in 14 patients on 70 mg, 11 SAEs in 9 patients on 210 mg, and 7 SAEs in 6 patients on 490 mg). The most common SAE was asthma. One SAE of moderate livedo reticularis (70 mg) was considered a suspected unexpected serious adverse drug reaction (SUSAR) related to astegolimab and was reported two days after the second dose, leading to discontinuation of astegolimab. Two (0.4%) patients reported anaphylaxis and hypersensitivity reactions: 1 severe SAE of anaphylactic reaction (placebo), and 1 moderate hypersensitivity (490 mg) considered related to astegolimab. Three (0.6%) patients experienced a potential Major Adverse Cardiac Event (MACE) (1 patient each in the placebo, 70 mg, and 210 mg groups). None of the potential MACE were considered related to astegolimab. Overall, 233 (46.4%) patients reported events of infection. A comparable number of patients reported infection across all treatment groups (65 [51.2%] patients on placebo, 55 [43.3%] patients on 70 mg, 58 [46.0%] patients on 210 mg, and 55 [45.1%] patients on 490 mg). The most frequently reported infection (>=10% incidence) was nasopharyngitis (12.7%). One patient on astegolimab 210 mg and the partner of one patient on placebo became pregnant during the study. Both delivered normal/healthy babies. Two deaths, unrelated to study drug, were reported: one patient on 210 mg astegolimab died following an SAE of asthma; the other patient on 490 mg astegolimab had an unexplained death. There were no clinically meaningful changes in laboratory parameters, vital signs, or ECG results, other than the 10% decrease in mean blood eosinophil counts in the astegolimab-treated groups, with no safety concerns. Treatment-induced ADAs were comparable between the astegolimab groups and had no impact on safety. Overall, astegolimab was well tolerated at all doses used and had a safety profile consistent  with that observed in the previous astegolimab Phase I studies.  Serious Adverse  Reactions Observed in Previous COPD Study: In the completed IIS Study [id number], a total of 81 patients received at least one dose of astegolimab or placebo. The safety profile of astegolimab was similar to that of placebo. There were a total of 222 AEs reported in 62 patients. A total of 28 (72%) patients in the placebo arm and 34 (81%) patients in the astegolimab arm reported at least one AE. The most commonly reported AEs were headache followed by urinary tract infection and viral upper respiratory tract infection. A total of 39 SAEs were reported in 28 (35%) patients; 16 (41%) patients in the placebo group and 12 (29%) in the astegolimab group. The most commonly reported SAE was hospital admission for community acquired pneumonia. Four SAEs resulted in patient discontinuation from treatment (1 patient in the placebo group and 3 patients in the astegolimab group). One patient in the placebo group experienced an AESI of potential MACE (heart failure, unrelated to the study treatment). No anaphylaxis or pregnancies were reported. Two deaths, unrelated to study treatment, were reported: 1 patient in the placebo group died after hospital acquired pneumonia, and another patient in the placebo group died after pneumonia and type 2 respiratory failure.  Safety Data: Astegolimab has been generally well tolerated. There have been 49 patient deaths across all astegolimab studies (Section 5.5.2), none of which were considered related to astegolimab. A total of 144 subjects experienced a total of 224 SAEs across all astegolimab studies. Of these, an SAE livedo reticularis observed in 1 patient was considered related to astegolimab by the investigator (Section 5.5.3). AEs leading to withdrawal (Section 5.5.4) have generally occurred at a rate similar to what would be expected for clinical trials in the studies' respective indications.    [facility] [place] Clinical Research Coordinator note :    This visit for Subject [id number] with DOB: 1956-03-20 on 02 Dec 2023 for the above protocol is Visit/Encounter # week 28  and is for purpose of research.    Subject expressed continued interest and consent in continuing as a study subject. Subject confirmed that there was no change in contact information (e.g. address, telephone, email). Subject thanked for participation in research and contribution to science.   The Subject was informed that the PI Dr. Bertrum Brodie continues to have oversight of the subject's visits and course  through relevant discussions, reviews and also specifically of this visit by routing of this note to the PI. All procedures and assessments were performed per above stated protocol. Subject tolerated injections well. Please refer to the subjects paper source binder for further details of this visit.     Signed by Dara Ear Clinical Research Coordinator  [facility]  [place], 

## 2023-12-15 ENCOUNTER — Encounter: Admitting: *Deleted

## 2023-12-15 DIAGNOSIS — J449 Chronic obstructive pulmonary disease, unspecified: Secondary | ICD-10-CM

## 2023-12-15 MED ORDER — STUDY - ARNASA GB43374 (OPEN-LABEL) - ASTEGOLIMAB 238 MG/1.7 ML SQ INJECTION (PI-RAMASWAMY)
476.0000 mg | INJECTION | SUBCUTANEOUS | Status: AC
  Administered 2023-12-15: 476 mg via SUBCUTANEOUS
  Filled 2023-12-15: qty 3.4

## 2023-12-15 NOTE — Research (Signed)
 Title: A phase III Open-Label extension study to evaluate the long-term safety of Astegolimab in patiens with Chronic Obstructive Pulmonary disease.   Dose and Duration of Treatment: Astegolimab is presented as a sterile, slightly brown-yellow solution. Each single-use, 2.25 mL pre-filled syringe contains 1.7 mL deliverable volume. Astegolimab drug product is formulated at 140 mg/mL astegolimab with 114 mM succinic acid, 200 mM L-arginine, 10 mM L-methionine, 0.06% (w/v) polysorbate 20, pH 5.7. Placebo for astegolimab is supplied in an identical pre-filled syringe configuration.   Protocol # [id number] Sponsor: [employer] [address] [place]   Protocol Version: v3 dated 15Oct2024 IB: version 9.0 dated April 2024 ICF:  ICFs: Make sure that the subject is re-consented with the updated ICFs Main ICF: [employer] IRB Approved Version 01 Jul 2023, Revised 28Nov2024 Mobile Nursing: [employer] IR[facility] Approved Version 26Oct2023, Revised 08Aug2024 Optional Home Study Treatment: IRB Approved Version 28 May 2022 Revised 11 Mar 2023 ADDENDUM 1: IRB Approved Version 24Jul2024 Revised 8Aug2024 Lab Manual: V2.1.0 dated 21Jun2024    Mechanism of action: Astegolimab (also known as [id number] or [id number]) is a fully human, IgG2 monoclonal antibody that binds with high affinity to the interleukin (IL)-33 (IL-33) receptor, ST2, thereby blocking the signaling of IL-33, an inflammatory cytokine of the IL-1 family and member of the "alarmin" class of molecules. Astegolimab binds with high affinity to the human and cynomolgus monkey receptor for IL-33, ST2, and blocks IL-33 binding, thus inhibiting association with the IL-1R accessory protein (AcP) co-receptor and formation of an activated receptor complex.   Key Inclusion Criteria: Able and willing to provide written informed consent and to comply with the study protocol. Ability to comply with the requirements of study protocol, according  to the investigators's best judgement. Completion of the 52-week treatment period in either [place] or Study [id number].  Key Exclusion Criteria: Significant non- compliance in the parent study, specifically defined as missing scheduled visits, per investigators judgement. Any new clinically significant pulmonary disease other than COPD since enrolling in the study. Unstable cardiac disease, myocardial infarction, or [place]  Heart Association Class III or IV heart failure since enrolling in the parent study.      Integrated Pharmacokinetic/Pharmacodynamic Analysis:  In Studies [id number], [id number], and I843982, exploratory biomarker analysis showed there were consistent decreases in blood eosinophil counts throughout the treatment period, potentially mediated by a direct effect of IL-33 on eosinophil progenitors. In [place], there was no significant difference in fractional exhaled nitric oxide (FeNO) levels (reflecting airway IL-4/IL-13 activity) between astegolimab-treated groups relative to placebo throughout the treatment period. These data suggest that astegolimab has only a limited effect on Type 2 inflammation in asthma. No data are applicable for Study QI69629. No data are available yet for Study [id number].  Special Warnings/Considerations: Administration of astegolimab, a protein therapeutic, may lead to the development of anti-astegolimab antibodies which could lead to AEs and/or decreased exposure. In non-clinical studies (see Section 4.2), ADA incidence has generally been low and there was no apparent ADA impact on PK and safety in these studies. To date, the immunogenicity rates observed with astegolimab in clinical studies have been relatively low as well (see Immunogenicity Section 5.6). Several clinical studies have been conducted in patients with asthma, atopic dermatitis, COPD (IIS Study GM01027) and COVID-19 severe pneumonia, which generally showed low incidence of ADAs (Table  31). There has been no correlation between ADA status and clinical findings or increased incidence of AEs. Astegolimab is now being considered in a larger study for the  treatment of COPD. This patient population is typically considered to have a hyper-responsive immune system. Route of administration for this molecule will be [place], either Q2W or Q4W. These factors increase the risk of development of an immune response to astegolimab, specifically with repeat dosing. From the previous IIS Study [id number], incidence of ADAs was low in COPD patients; this remains to be confirmed in a larger study. To monitor ADA development in ongoing studies, serum samples will be collected from patients at protocol-defined intervals. Patients who test positive for antibodies and have clinical sequelae that are considered potentially related to an ADA response may also be asked to return for additional follow-up testing.  Drug Interaction Studies: No PK drug interaction studies have been conducted to date.  Serious Adverse Reactions Observed in Asthma Study: During the treatment period, a similar proportion of patients across all cohorts experienced at least one AE, regardless of causality (Table 22). In total, 77.2%, 70.9%, 72.2%, and 72.1% of patients reported at least 1 AE in the placebo, 70 mg, 210 mg, and 490 mg groups, respectively. The most common AE's (>5% in any treatment group) were asthma, nasopharyngitis, upper respiratory tract infection, headache, and injection site reaction (ISR). The most common drug-related AE was ISR, which was reported more frequently in the astegolimab treatment groups than in the placebo group (1 [0.8%] patient in the placebo group, 10 [7.9%] patients with 70 mg, 8 [6.3%] patients with 210 mg, 6 [4.9%] patients with 490 mg). All ISRs were non-serious and mild or moderate in severity. During the treatment period, 50 SAEs were reported in 37 (7.4%) patients. The number of patients reporting SAEs  was comparable across all cohorts (11 SAEs in 8 patients on placebo, 21 SAEs in 14 patients on 70 mg, 11 SAEs in 9 patients on 210 mg, and 7 SAEs in 6 patients on 490 mg). The most common SAE was asthma. One SAE of moderate livedo reticularis (70 mg) was considered a suspected unexpected serious adverse drug reaction (SUSAR) related to astegolimab and was reported two days after the second dose, leading to discontinuation of astegolimab. Two (0.4%) patients reported anaphylaxis and hypersensitivity reactions: 1 severe SAE of anaphylactic reaction (placebo), and 1 moderate hypersensitivity (490 mg) considered related to astegolimab. Three (0.6%) patients experienced a potential Major Adverse Cardiac Event (MACE) (1 patient each in the placebo, 70 mg, and 210 mg groups). None of the potential MACE were considered related to astegolimab. Overall, 233 (46.4%) patients reported events of infection. A comparable number of patients reported infection across all treatment groups (65 [51.2%] patients on placebo, 55 [43.3%] patients on 70 mg, 58 [46.0%] patients on 210 mg, and 55 [45.1%] patients on 490 mg). The most frequently reported infection (>=10% incidence) was nasopharyngitis (12.7%). One patient on astegolimab 210 mg and the partner of one patient on placebo became pregnant during the study. Both delivered normal/healthy babies. Two deaths, unrelated to study drug, were reported: one patient on 210 mg astegolimab died following an SAE of asthma; the other patient on 490 mg astegolimab had an unexplained death. There were no clinically meaningful changes in laboratory parameters, vital signs, or ECG results, other than the 10% decrease in mean blood eosinophil counts in the astegolimab-treated groups, with no safety concerns. Treatment-induced ADAs were comparable between the astegolimab groups and had no impact on safety. Overall, astegolimab was well tolerated at all doses used and had a safety profile consistent  with that observed in the previous astegolimab Phase I studies.  Serious Adverse  Reactions Observed in Previous COPD Study: In the completed IIS Study [id number], a total of 81 patients received at least one dose of astegolimab or placebo. The safety profile of astegolimab was similar to that of placebo. There were a total of 222 AEs reported in 62 patients. A total of 28 (72%) patients in the placebo arm and 34 (81%) patients in the astegolimab arm reported at least one AE. The most commonly reported AEs were headache followed by urinary tract infection and viral upper respiratory tract infection. A total of 39 SAEs were reported in 28 (35%) patients; 16 (41%) patients in the placebo group and 12 (29%) in the astegolimab group. The most commonly reported SAE was hospital admission for community acquired pneumonia. Four SAEs resulted in patient discontinuation from treatment (1 patient in the placebo group and 3 patients in the astegolimab group). One patient in the placebo group experienced an AESI of potential MACE (heart failure, unrelated to the study treatment). No anaphylaxis or pregnancies were reported. Two deaths, unrelated to study treatment, were reported: 1 patient in the placebo group died after hospital acquired pneumonia, and another patient in the placebo group died after pneumonia and type 2 respiratory failure.  Safety Data: Astegolimab has been generally well tolerated. There have been 49 patient deaths across all astegolimab studies (Section 5.5.2), none of which were considered related to astegolimab. A total of 144 subjects experienced a total of 224 SAEs across all astegolimab studies. Of these, an SAE livedo reticularis observed in 1 patient was considered related to astegolimab by the investigator (Section 5.5.3). AEs leading to withdrawal (Section 5.5.4) have generally occurred at a rate similar to what would be expected for clinical trials in the studies' respective indications.    [facility] [place] Clinical Research Coordinator note :    This visit for Subject [id number] with DOB: 1956/02/15 on 15 Dec 2023 for the above protocol is Visit/Encounter #week 30  and is for purpose of research.    Subject expressed continued interest and consent in continuing as a study subject. Subject confirmed that there was no change in contact information (e.g. address, telephone, email). Subject thanked for participation in research and contribution to science.  The Subject was informed that the PI Dr. Bertrum Brodie continues to have oversight of the subject's visits and course  through relevant discussions, reviews and also specifically of this visit by routing of this note to the PI. All assessments and procedures were performed per above stated protocol. Subject tolerated injections well. Please refer to the subjects paper source binder for further details of this visit.    Signed by Dara Ear Clinical Research Coordinator  [facility]  [place], [place]

## 2023-12-16 ENCOUNTER — Telehealth: Payer: Self-pay | Admitting: Internal Medicine

## 2023-12-16 ENCOUNTER — Encounter

## 2023-12-16 NOTE — Telephone Encounter (Signed)
 Rc'd CMN for Dr. Ardeth Beckers. Submitted to him for signature.

## 2023-12-28 ENCOUNTER — Telehealth: Payer: Self-pay | Admitting: *Deleted

## 2023-12-28 NOTE — Telephone Encounter (Signed)
 Copied from CRM [id number]. Topic: General - Other >> Dec 24, 2023 11:16 AM Juliana Ocean wrote: Reason for CRM: pt states Oscar Blazing from [facility] keeps calling her telling her she needs appt w/ Dr Bertrum Brodie.  Pt is in a research program, and she says they send the information to the dr.  In addition, Dr Bertrum Brodie sees her at her research appt on some days. Pt doesn't want [employer] to come and pick up her ventilator.  If someone could please let Oscar Blazing w/ [employer] know the paperwork has been filled out. Pt feels like she has being harassed by this lady and it is giving her anxiety that she does not need. So if someone could please tell Oscar Blazing the paperwork for the ventilator is in process. (For the ventilator)  The CMN was signed and faxed back to [employer] 12/28/23

## 2023-12-30 ENCOUNTER — Encounter: Admitting: *Deleted

## 2023-12-30 DIAGNOSIS — J449 Chronic obstructive pulmonary disease, unspecified: Secondary | ICD-10-CM

## 2023-12-30 MED ORDER — STUDY - ARNASA GB43374 (OPEN-LABEL) - ASTEGOLIMAB 238 MG/1.7 ML SQ INJECTION (PI-RAMASWAMY)
476.0000 mg | INJECTION | SUBCUTANEOUS | Status: AC
  Administered 2023-12-30: 476 mg via SUBCUTANEOUS
  Filled 2023-12-30: qty 3.4

## 2023-12-30 NOTE — Research (Signed)
 Title: A phase III Open-Label extension study to evaluate the long-term safety of Astegolimab in patiens with Chronic Obstructive Pulmonary disease.   Dose and Duration of Treatment: Astegolimab is presented as a sterile, slightly brown-yellow solution. Each single-use, 2.25 mL pre-filled syringe contains 1.7 mL deliverable volume. Astegolimab drug product is formulated at 140 mg/mL astegolimab with 114 mM succinic acid, 200 mM L-arginine, 10 mM L-methionine, 0.06% (w/v) polysorbate 20, pH 5.7. Placebo for astegolimab is supplied in an identical pre-filled syringe configuration.   Protocol # [id number] Sponsor: [employer] [address] [place]   Protocol Version: v3 dated 15Oct2024 IB: version 9.0 dated April 2024 ICF:  ICFs: Make sure that the subject is re-consented with the updated ICFs Main ICF: [employer] [facility] Approved Version 01 Jul 2023, Revised 28Nov2024 Mobile Nursing: [employer] Approved Version 26Oct2023, Revised 08Aug2024 Optional Home Study Treatment: IRB Approved Version 28 May 2022 Revised 11 Mar 2023 ADDENDUM 1: IRB Approved Version 24Jul2024 Revised 8Aug2024 Lab Manual: V2.1.0 dated 21Jun2024   Mechanism of action: Astegolimab (also known as [id number] or [id number]) is a fully human, IgG2 monoclonal antibody that binds with high affinity to the interleukin (IL)-33 (IL-33) receptor, ST2, thereby blocking the signaling of IL-33, an inflammatory cytokine of the IL-1 family and member of the "alarmin" class of molecules. Astegolimab binds with high affinity to the human and cynomolgus monkey receptor for IL-33, ST2, and blocks IL-33 binding, thus inhibiting association with the IL-1R accessory protein (AcP) co-receptor and formation of an activated receptor complex.   Key Inclusion Criteria: Able and willing to provide written informed consent and to comply with the study protocol. Ability to comply with the requirements of study protocol, according to  the investigators's best judgement. Completion of the 52-week treatment period in either [place] or Study [id number].  Key Exclusion Criteria: Significant non- compliance in the parent study, specifically defined as missing scheduled visits, per investigators judgement. Any new clinically significant pulmonary disease other than COPD since enrolling in the study. Unstable cardiac disease, myocardial infarction, or [place]  Heart Association Class III or IV heart failure since enrolling in the parent study.      Integrated Pharmacokinetic/Pharmacodynamic Analysis:  In Studies [id number], [id number], and [id number], exploratory biomarker analysis showed there were consistent decreases in blood eosinophil counts throughout the treatment period, potentially mediated by a direct effect of IL-33 on eosinophil progenitors. In [place], there was no significant difference in fractional exhaled nitric oxide (FeNO) levels (reflecting airway IL-4/IL-13 activity) between astegolimab-treated groups relative to placebo throughout the treatment period. These data suggest that astegolimab has only a limited effect on Type 2 inflammation in asthma. No data are applicable for Study [id number]. No data are available yet for Study [id number].  Special Warnings/Considerations: Administration of astegolimab, a protein therapeutic, may lead to the development of anti-astegolimab antibodies which could lead to AEs and/or decreased exposure. In non-clinical studies (see Section 4.2), ADA incidence has generally been low and there was no apparent ADA impact on PK and safety in these studies. To date, the immunogenicity rates observed with astegolimab in clinical studies have been relatively low as well (see Immunogenicity Section 5.6). Several clinical studies have been conducted in patients with asthma, atopic dermatitis, COPD (IIS Study IR51884) and COVID-19 severe pneumonia, which generally showed low incidence of ADAs (Table 31).  There has been no correlation between ADA status and clinical findings or increased incidence of AEs. Astegolimab is now being considered in a larger study for the treatment  of COPD. This patient population is typically considered to have a hyper-responsive immune system. Route of administration for this molecule will be [place], either Q2W or Q4W. These factors increase the risk of development of an immune response to astegolimab, specifically with repeat dosing. From the previous IIS Study [id number], incidence of ADAs was low in COPD patients; this remains to be confirmed in a larger study. To monitor ADA development in ongoing studies, serum samples will be collected from patients at protocol-defined intervals. Patients who test positive for antibodies and have clinical sequelae that are considered potentially related to an ADA response may also be asked to return for additional follow-up testing.  Drug Interaction Studies: No PK drug interaction studies have been conducted to date.  Serious Adverse Reactions Observed in Asthma Study: During the treatment period, a similar proportion of patients across all cohorts experienced at least one AE, regardless of causality (Table 22). In total, 77.2%, 70.9%, 72.2%, and 72.1% of patients reported at least 1 AE in the placebo, 70 mg, 210 mg, and 490 mg groups, respectively. The most common AE's (>5% in any treatment group) were asthma, nasopharyngitis, upper respiratory tract infection, headache, and injection site reaction (ISR). The most common drug-related AE was ISR, which was reported more frequently in the astegolimab treatment groups than in the placebo group (1 [0.8%] patient in the placebo group, 10 [7.9%] patients with 70 mg, 8 [6.3%] patients with 210 mg, 6 [4.9%] patients with 490 mg). All ISRs were non-serious and mild or moderate in severity. During the treatment period, 50 SAEs were reported in 37 (7.4%) patients. The number of patients reporting SAEs was  comparable across all cohorts (11 SAEs in 8 patients on placebo, 21 SAEs in 14 patients on 70 mg, 11 SAEs in 9 patients on 210 mg, and 7 SAEs in 6 patients on 490 mg). The most common SAE was asthma. One SAE of moderate livedo reticularis (70 mg) was considered a suspected unexpected serious adverse drug reaction (SUSAR) related to astegolimab and was reported two days after the second dose, leading to discontinuation of astegolimab. Two (0.4%) patients reported anaphylaxis and hypersensitivity reactions: 1 severe SAE of anaphylactic reaction (placebo), and 1 moderate hypersensitivity (490 mg) considered related to astegolimab. Three (0.6%) patients experienced a potential Major Adverse Cardiac Event (MACE) (1 patient each in the placebo, 70 mg, and 210 mg groups). None of the potential MACE were considered related to astegolimab. Overall, 233 (46.4%) patients reported events of infection. A comparable number of patients reported infection across all treatment groups (65 [51.2%] patients on placebo, 55 [43.3%] patients on 70 mg, 58 [46.0%] patients on 210 mg, and 55 [45.1%] patients on 490 mg). The most frequently reported infection (>=10% incidence) was nasopharyngitis (12.7%). One patient on astegolimab 210 mg and the partner of one patient on placebo became pregnant during the study. Both delivered normal/healthy babies. Two deaths, unrelated to study drug, were reported: one patient on 210 mg astegolimab died following an SAE of asthma; the other patient on 490 mg astegolimab had an unexplained death. There were no clinically meaningful changes in laboratory parameters, vital signs, or ECG results, other than the 10% decrease in mean blood eosinophil counts in the astegolimab-treated groups, with no safety concerns. Treatment-induced ADAs were comparable between the astegolimab groups and had no impact on safety. Overall, astegolimab was well tolerated at all doses used and had a safety profile consistent with  that observed in the previous astegolimab Phase I studies.  Serious Adverse Reactions  Observed in Previous COPD Study: In the completed IIS Study [id number], a total of 81 patients received at least one dose of astegolimab or placebo. The safety profile of astegolimab was similar to that of placebo. There were a total of 222 AEs reported in 62 patients. A total of 28 (72%) patients in the placebo arm and 34 (81%) patients in the astegolimab arm reported at least one AE. The most commonly reported AEs were headache followed by urinary tract infection and viral upper respiratory tract infection. A total of 39 SAEs were reported in 28 (35%) patients; 16 (41%) patients in the placebo group and 12 (29%) in the astegolimab group. The most commonly reported SAE was hospital admission for community acquired pneumonia. Four SAEs resulted in patient discontinuation from treatment (1 patient in the placebo group and 3 patients in the astegolimab group). One patient in the placebo group experienced an AESI of potential MACE (heart failure, unrelated to the study treatment). No anaphylaxis or pregnancies were reported. Two deaths, unrelated to study treatment, were reported: 1 patient in the placebo group died after hospital acquired pneumonia, and another patient in the placebo group died after pneumonia and type 2 respiratory failure.  Safety Data: Astegolimab has been generally well tolerated. There have been 49 patient deaths across all astegolimab studies (Section 5.5.2), none of which were considered related to astegolimab. A total of 144 subjects experienced a total of 224 SAEs across all astegolimab studies. Of these, an SAE livedo reticularis observed in 1 patient was considered related to astegolimab by the investigator (Section 5.5.3). AEs leading to withdrawal (Section 5.5.4) have generally occurred at a rate similar to what would be expected for clinical trials in the studies' respective indications.   [facility]  [place] Clinical Research Coordinator note :    This visit for Subject [id number] with DOB: 01-29-1956 on 30 Dec 2023 for the above protocol is Visit/Encounter #  week 32 and is for purpose of research.      Subject expressed continued interest and consent in continuing as a study subject. Subject confirmed that there was no change in contact information (e.g. address, telephone, email). Subject thanked for participation in research and contribution to science.  The Subject was informed that the PI Dr. Bertrum Brodie continues to have oversight of the subject's visits and course  through relevant discussions, reviews and also specifically of this visit by routing of this note to the PI. All assessments and procedures were performed per above stated protocol. Subject tolerated injections well. Please refer to subjects paper source binder for further details of this visit.   Signed by Dara Ear Clinical Research Coordinator  [facility]  [place], [place]

## 2024-01-13 ENCOUNTER — Encounter

## 2024-01-13 ENCOUNTER — Encounter: Admitting: Internal Medicine

## 2024-01-13 DIAGNOSIS — J449 Chronic obstructive pulmonary disease, unspecified: Secondary | ICD-10-CM

## 2024-01-13 DIAGNOSIS — Z006 Encounter for examination for normal comparison and control in clinical research program: Secondary | ICD-10-CM

## 2024-01-13 MED ORDER — STUDY - ARNASA GB43374 (OPEN-LABEL) - ASTEGOLIMAB 238 MG/1.7 ML SQ INJECTION (PI-RAMASWAMY)
476.0000 mg | INJECTION | SUBCUTANEOUS | Status: AC
  Administered 2024-01-13: 476 mg via SUBCUTANEOUS
  Filled 2024-01-13: qty 3.4

## 2024-01-13 NOTE — Progress Notes (Signed)
 Title: A Phase III, randomized, double-blind, placebo controlled, multicenter study to evaluate the efficacy and safety of astegolimab in patients with chronic obstructive pulmonary disease    Dose and Duration of Treatment: Astegolimab is presented as a sterile, slightly brown-yellow solution. Each single-use, 2.25 mL pre-filled syringe contains 1.7 mL deliverable volume. Astegolimab drug product is formulated at 140 mg/mL astegolimab with 114 mM succinic acid, 200 mM L-arginine, 10 mM L-methionine, 0.06% (w/v) polysorbate 20, pH 5.7. Placebo for astegolimab is supplied in an identical pre-filled syringe configuration.  Protocol # [id number]   Xxxx  S: Here for injection visit.  This is the scheduled date January 13, 2024.  Has been as well as to get her administered the subcutaneous injection.  She denied any complaints.  She was felt stable.  Of note the dispensation I ALMAC IXRS was done inadvertently by myself yesterday but the injection is only scheduled today.  Investigational drug services pharmacy was notified and the investigation medical product  was appropriately dispensed today and I administered it.     ICD-10-CM   1. Patient in clinical research study  Z00.6     2. COPD, severe (HCC)  J44.9        Coordinator conducted the visit as well and I gave oversight.    SIGNATURE    Dr. Maire Scot, M.D., F.C.C.P, ACRP-CPI Pulmonary and Critical Care Medicine Research Investigator, [facility] Health Staff Physician, [facility] Center Director - Interstitial Lung Disease  Program  Pulmonary Fibrosis [facility] [place] [facility] and [facility] @ [facility] [place], [place], [address]   Pager: [phone], If no answer  OR between  19:00-7:00h: page [phone] Telephone (research): [phone]  5:20 PM 01/13/2024   5:20 PM 01/13/2024

## 2024-01-27 ENCOUNTER — Encounter

## 2024-01-27 ENCOUNTER — Encounter: Admitting: Internal Medicine

## 2024-01-27 DIAGNOSIS — Z006 Encounter for examination for normal comparison and control in clinical research program: Secondary | ICD-10-CM

## 2024-01-27 DIAGNOSIS — J449 Chronic obstructive pulmonary disease, unspecified: Secondary | ICD-10-CM

## 2024-01-27 MED ORDER — STUDY - ARNASA GB43374 (OPEN-LABEL) - ASTEGOLIMAB 238 MG/1.7 ML SQ INJECTION (PI-RAMASWAMY)
476.0000 mg | INJECTION | SUBCUTANEOUS | Status: AC
  Administered 2024-01-27: 476 mg via SUBCUTANEOUS
  Filled 2024-01-27: qty 3.4

## 2024-01-27 NOTE — Progress Notes (Signed)
 Title: A phase III Open-Label extension study to evaluate the long-term safety of Astegolimab in patiens with Chronic Obstructive Pulmonary disease.   Dose and Duration of Treatment: Astegolimab is presented as a sterile, slightly brown-yellow solution. Each single-use, 2.25 mL pre-filled syringe contains 1.7 mL deliverable volume. Astegolimab drug product is formulated at 140 mg/mL astegolimab with 114 mM succinic acid, 200 mM L-arginine, 10 mM L-methionine, 0.06% (w/v) polysorbate 20, pH 5.7. Placebo for astegolimab is supplied in an identical pre-filled syringe configuration.   Protocol # [id number] Sponsor: [employer] [address] [place]     This visit for Subject Katie Greene with DOB: 1956/02/15 on 01/27/2024 for the above protocol is Visit/Encounter # research  and is for purpose of OLE . Subject/LAR expressed continued interest and consent in continuing as a study subject. Subject thanked for participation in research and contribution to science.    No new issues Exam done EKG was done by myself SQ injection given LUQ and RUQ of ABdomen Labds done but phlebotomist failed and only some sample gotten PFT not done due to no tech on delegation    SIGNATURE    Dr. Dorethia Cave, M.D., F.C.C.P, ACRP-CPI Pulmonary and Critical Care Medicine Research Investigator, [facility] @ [facility] [place] Health Staff Physician, [facility] Center Director - Interstitial Lung Disease  Program  Pulmonary Fibrosis [facility] [place] [facility] and [facility] @ [facility] [place], [place], [address]   Pager: [phone], If no answer  OR between  19:00-7:00h: page [phone] Telephone (research): [phone]  6:18 PM 01/27/2024   6:18 PM 01/27/2024

## 2024-02-09 ENCOUNTER — Encounter

## 2024-02-10 ENCOUNTER — Encounter: Admitting: Internal Medicine

## 2024-02-10 DIAGNOSIS — J449 Chronic obstructive pulmonary disease, unspecified: Secondary | ICD-10-CM

## 2024-02-10 DIAGNOSIS — Z006 Encounter for examination for normal comparison and control in clinical research program: Secondary | ICD-10-CM

## 2024-02-10 MED ORDER — STUDY - ARNASA GB43374 (OPEN-LABEL) - ASTEGOLIMAB 238 MG/1.7 ML SQ INJECTION (PI-RAMASWAMY)
476.0000 mg | INJECTION | SUBCUTANEOUS | Status: AC
  Administered 2024-02-10: 476 mg via SUBCUTANEOUS
  Filled 2024-02-10: qty 3.4

## 2024-02-10 NOTE — Research (Signed)
 Title: A phase III Open-Label extension study to evaluate the long-term safety of Astegolimab in patiens with Chronic Obstructive Pulmonary disease.   Dose and Duration of Treatment: Astegolimab is presented as a sterile, slightly brown-yellow solution. Each single-use, 2.25 mL pre-filled syringe contains 1.7 mL deliverable volume. Astegolimab drug product is formulated at 140 mg/mL astegolimab with 114 mM succinic acid, 200 mM L-arginine, 10 mM L-methionine, 0.06% (w/v) polysorbate 20, pH 5.7. Placebo for astegolimab is supplied in an identical pre-filled syringe configuration.    Protocol # [id number] Sponsor: [employer] [id number] [place]   Protocol Version: v3 dated 15Oct2024 IB: version 9.0 dated April 2024 ICF:  ICFs: Make sure that the subject is re-consented with the updated ICFs Main ICF: [facility] Approved Version 01 Jul 2023, Revised 28Nov2024 Mobile Nursing: [employer] [facility] Approved Version 26Oct2023, Revised 08Aug2024 Optional Home Study Treatment: IRB Approved Version 28 May 2022 Revised 11 Mar 2023 ADDENDUM 1: IRB Approved Version 24Jul2024 Revised 8Aug2024 Lab Manual: Lab Manual v2.2.0 dated 05Dec2024    Mechanism of action: Astegolimab (also known as [id number] or [id number]) is a fully human, IgG2 monoclonal antibody that binds with high affinity to the interleukin (IL)-33 (IL-33) receptor, ST2, thereby blocking the signaling of IL-33, an inflammatory cytokine of the IL-1 family and member of the alarmin class of molecules. Astegolimab binds with high affinity to the human and cynomolgus monkey receptor for IL-33, ST2, and blocks IL-33 binding, thus inhibiting association with the IL-1R accessory protein (AcP) co-receptor and formation of an activated receptor complex.   Key Inclusion Criteria: Able and willing to provide written informed consent and to comply with the study protocol. Ability to comply with the requirements of study protocol,  according to the investigators's best judgement. Completion of the 52-week treatment period in either [place] or Study HA55667.   Key Exclusion Criteria: Significant non- compliance in the parent study, specifically defined as missing scheduled visits, per investigators judgement. Any new clinically significant pulmonary disease other than COPD since enrolling in the study. Unstable cardiac disease, myocardial infarction, or [place]  Heart Association Class III or IV heart failure since enrolling in the parent study.     Integrated Pharmacokinetic/Pharmacodynamic Analysis:  In Studies HA60757, [id number], and [id number], exploratory biomarker analysis showed there were consistent decreases in blood eosinophil counts throughout the treatment period, potentially mediated by a direct effect of IL-33 on eosinophil progenitors. In [place], there was no significant difference in fractional exhaled nitric oxide (FeNO) levels (reflecting airway IL-4/IL-13 activity) between astegolimab-treated groups relative to placebo throughout the treatment period. These data suggest that astegolimab has only a limited effect on Type 2 inflammation in asthma. No data are applicable for Study HJ57530. No data are available yet for Study HA56688.   Special Warnings/Considerations: Administration of astegolimab, a protein therapeutic, may lead to the development of anti-astegolimab antibodies which could lead to AEs and/or decreased exposure. In non-clinical studies (see Section 4.2), ADA incidence has generally been low and there was no apparent ADA impact on PK and safety in these studies. To date, the immunogenicity rates observed with astegolimab in clinical studies have been relatively low as well (see Immunogenicity Section 5.6). Several clinical studies have been conducted in patients with asthma, atopic dermatitis, COPD (IIS Study HA59431) and COVID-19 severe pneumonia, which generally showed low incidence of ADAs  (Table 31). There has been no correlation between ADA status and clinical findings or increased incidence of AEs. Astegolimab is now being considered in a  larger study for the treatment of COPD. This patient population is typically considered to have a hyper-responsive immune system. Route of administration for this molecule will be [place], either Q2W or Q4W. These factors increase the risk of development of an immune response to astegolimab, specifically with repeat dosing. From the previous IIS Study HA59431, incidence of ADAs was low in COPD patients; this remains to be confirmed in a larger study. To monitor ADA development in ongoing studies, serum samples will be collected from patients at protocol-defined intervals. Patients who test positive for antibodies and have clinical sequelae that are considered potentially related to an ADA response may also be asked to return for additional follow-up testing.   Drug Interaction Studies: No PK drug interaction studies have been conducted to date.   Serious Adverse Reactions Observed in Asthma Study: During the treatment period, a similar proportion of patients across all cohorts experienced at least one AE, regardless of causality (Table 22). In total, 77.2%, 70.9%, 72.2%, and 72.1% of patients reported at least 1 AE in the placebo, 70 mg, 210 mg, and 490 mg groups, respectively. The most common AE's (>5% in any treatment group) were asthma, nasopharyngitis, upper respiratory tract infection, headache, and injection site reaction (ISR). The most common drug-related AE was ISR, which was reported more frequently in the astegolimab treatment groups than in the placebo group (1 [0.8%] patient in the placebo group, 10 [7.9%] patients with 70 mg, 8 [6.3%] patients with 210 mg, 6 [4.9%] patients with 490 mg). All ISRs were non-serious and mild or moderate in severity. During the treatment period, 50 SAEs were reported in 37 (7.4%) patients. The number of patients  reporting SAEs was comparable across all cohorts (11 SAEs in 8 patients on placebo, 21 SAEs in 14 patients on 70 mg, 11 SAEs in 9 patients on 210 mg, and 7 SAEs in 6 patients on 490 mg). The most common SAE was asthma. One SAE of moderate livedo reticularis (70 mg) was considered a suspected unexpected serious adverse drug reaction (SUSAR) related to astegolimab and was reported two days after the second dose, leading to discontinuation of astegolimab. Two (0.4%) patients reported anaphylaxis and hypersensitivity reactions: 1 severe SAE of anaphylactic reaction (placebo), and 1 moderate hypersensitivity (490 mg) considered related to astegolimab. Three (0.6%) patients experienced a potential Major Adverse Cardiac Event (MACE) (1 patient each in the placebo, 70 mg, and 210 mg groups). None of the potential MACE were considered related to astegolimab. Overall, 233 (46.4%) patients reported events of infection. A comparable number of patients reported infection across all treatment groups (65 [51.2%] patients on placebo, 55 [43.3%] patients on 70 mg, 58 [46.0%] patients on 210 mg, and 55 [45.1%] patients on 490 mg). The most frequently reported infection (>=10% incidence) was nasopharyngitis (12.7%). One patient on astegolimab 210 mg and the partner of one patient on placebo became pregnant during the study. Both delivered normal/healthy babies. Two deaths, unrelated to study drug, were reported: one patient on 210 mg astegolimab died following an SAE of asthma; the other patient on 490 mg astegolimab had an unexplained death. There were no clinically meaningful changes in laboratory parameters, vital signs, or ECG results, other than the 10% decrease in mean blood eosinophil counts in the astegolimab-treated groups, with no safety concerns. Treatment-induced ADAs were comparable between the astegolimab groups and had no impact on safety. Overall, astegolimab was well tolerated at all doses used and had a safety  profile consistent with that observed in the previous astegolimab  Phase I studies.   Serious Adverse Reactions Observed in Previous COPD Study: In the completed IIS Study HA59431, a total of 81 patients received at least one dose of astegolimab or placebo. The safety profile of astegolimab was similar to that of placebo. There were a total of 222 AEs reported in 62 patients. A total of 28 (72%) patients in the placebo arm and 34 (81%) patients in the astegolimab arm reported at least one AE. The most commonly reported AEs were headache followed by urinary tract infection and viral upper respiratory tract infection. A total of 39 SAEs were reported in 28 (35%) patients; 16 (41%) patients in the placebo group and 12 (29%) in the astegolimab group. The most commonly reported SAE was hospital admission for community acquired pneumonia. Four SAEs resulted in patient discontinuation from treatment (1 patient in the placebo group and 3 patients in the astegolimab group). One patient in the placebo group experienced an AESI of potential MACE (heart failure, unrelated to the study treatment). No anaphylaxis or pregnancies were reported. Two deaths, unrelated to study treatment, were reported: 1 patient in the placebo group died after hospital acquired pneumonia, and another patient in the placebo group died after pneumonia and type 2 respiratory failure.   Safety Data: Astegolimab has been generally well tolerated. There have been 49 patient deaths across all astegolimab studies (Section 5.5.2), none of which were considered related to astegolimab. A total of 144 subjects experienced a total of 224 SAEs across all astegolimab studies. Of these, an SAE livedo reticularis observed in 1 patient was considered related to astegolimab by the investigator (Section 5.5.3). AEs leading to withdrawal (Section 5.5.4) have generally occurred at a rate similar to what would be expected for clinical trials in the studies' respective  indications.   [facility] Clinical Research Coordinator note :    This visit for Subject [id number] with DOB: 06 Mar 1959 on 10/Jul/2025 for the above protocol is Visit/Encounter # week 38 and is for purpose of research.    Subject expressed continued interest and consent in continuing as a study subject. Subject confirmed that there was no change in contact information (e.g. address, telephone, email). Subject thanked for participation in research and contribution to science.    The Subject was informed that the PI Dr. Geronimo continues to have oversight of the subject's visits and course  through relevant discussions, reviews and also specifically of this visit by routing of this note to the PI.

## 2024-02-10 NOTE — Progress Notes (Signed)
 PI oversigh  PI OVERSIGHT ATTESTATION  I the Principal Investigator (PI) for the above mentioned study attest that I reviewed the mentioned  clinical research coordinator  notes on research subject  Katie Greene  born 10/15/1955 . I  agree with the findings mentioned above   Dr.Champion Corales Geronimo, MD Pulmonary and Critical Care Medicine Research Investigator & Staff Physician [facility] and [facility]  [facility] Pulmonary and Critical Care Pager: [phone], If no answer or between  15:00h - 7:00h: call [phone]  02/10/2024 6:18 PM

## 2024-02-10 NOTE — Progress Notes (Signed)
 Title: A phase III Open-Label extension study to evaluate the long-term safety of Astegolimab in patiens with Chronic Obstructive Pulmonary disease.   Dose and Duration of Treatment: Astegolimab is presented as a sterile, slightly brown-yellow solution. Each single-use, 2.25 mL pre-filled syringe contains 1.7 mL deliverable volume. Astegolimab drug product is formulated at 140 mg/mL astegolimab with 114 mM succinic acid, 200 mM L-arginine, 10 mM L-methionine, 0.06% (w/v) polysorbate 20, pH 5.7. Placebo for astegolimab is supplied in an identical pre-filled syringe configuration.   Protocol # [id number] Sponsor: [employer] [address] [place]   Designer, fashion/clothing and [employer]         Mechanism of action: Astegolimab (also known as [id number] or [id number]) is a fully human, IgG2 monoclonal antibody that binds with high affinity to the interleukin (IL)-33 (IL-33) receptor, ST2, thereby blocking the signaling of IL-33, an inflammatory cytokine of the IL-1 family and member of the alarmin class of molecules. Astegolimab binds with high affinity to the human and cynomolgus monkey receptor for IL-33, ST2, and blocks IL-33 binding, thus inhibiting association with the IL-1R accessory protein (AcP) co-receptor and formation of an activated receptor complex.   Key Inclusion Criteria: Able and willing to provide written informed consent and to comply with the study protocol. Ability to comply with the requirements of study protocol, according to the investigators's best judgement. Completion of the 52-week treatment period in either [place] or Study HA55667.  Key Exclusion Criteria: Significant non- compliance in the parent study, specifically defined as missing scheduled visits, per investigators judgement. Any new clinically significant pulmonary disease other than COPD since enrolling in the study. Unstable cardiac disease, myocardial infarction, or [place] Heart Association Class III or IV heart failure since enrolling in the parent study.      Integrated Pharmacokinetic/Pharmacodynamic Analysis:  In Studies HA60757, [id number], and [id number], exploratory biomarker analysis showed there were consistent decreases in blood eosinophil counts throughout the treatment period, potentially mediated by a direct effect of IL-33 on eosinophil progenitors. In [place], there was no significant difference in fractional exhaled nitric oxide (FeNO) levels (reflecting airway IL-4/IL-13 activity) between astegolimab-treated groups relative to placebo throughout the treatment period. These data suggest that astegolimab has only a limited effect on Type 2 inflammation in asthma. No data are applicable for Study HJ57530. No data are available yet for Study HA56688.  Special Warnings/Considerations: Administration of astegolimab, a protein therapeutic, may lead to the development of anti-astegolimab antibodies which could lead to AEs and/or decreased exposure. In non-clinical studies (see Section 4.2), ADA incidence has generally been low and there was no apparent ADA impact on PK and safety in these studies. To date, the immunogenicity rates observed with astegolimab in clinical studies have been relatively low as well (see Immunogenicity Section 5.6). Several clinical studies have been conducted in patients with asthma, atopic dermatitis, COPD (IIS Study HA59431) and COVID-19 severe pneumonia, which generally showed low incidence of ADAs (Table 31). There has been no correlation between ADA status and clinical findings or increased incidence of AEs. Astegolimab is now being considered in a larger study for the treatment of COPD. This patient population is typically considered to have a hyper-responsive immune system. Route of administration for this molecule will be [place], either Q2W or Q4W. These factors increase the risk of development of an immune response to astegolimab,  specifically with repeat dosing. From the previous IIS Study HA59431, incidence of ADAs was low in COPD patients; this remains to be  confirmed in a larger study. To monitor ADA development in ongoing studies, serum samples will be collected from patients at protocol-defined intervals. Patients who test positive for antibodies and have clinical sequelae that are considered potentially related to an ADA response may also be asked to return for additional follow-up testing.  Drug Interaction Studies: No PK drug interaction studies have been conducted to date.  Serious Adverse Reactions Observed in Asthma Study: During the treatment period, a similar proportion of patients across all cohorts experienced at least one AE, regardless of causality (Table 22). In total, 77.2%, 70.9%, 72.2%, and 72.1% of patients reported at least 1 AE in the placebo, 70 mg, 210 mg, and 490 mg groups, respectively. The most common AE's (>5% in any treatment group) were asthma, nasopharyngitis, upper respiratory tract infection, headache, and injection site reaction (ISR). The most common drug-related AE was ISR, which was reported more frequently in the astegolimab treatment groups than in the placebo group (1 [0.8%] patient in the placebo group, 10 [7.9%] patients with 70 mg, 8 [6.3%] patients with 210 mg, 6 [4.9%] patients with 490 mg). All ISRs were non-serious and mild or moderate in severity. During the treatment period, 50 SAEs were reported in 37 (7.4%) patients. The number of patients reporting SAEs was comparable across all cohorts (11 SAEs in 8 patients on placebo, 21 SAEs in 14 patients on 70 mg, 11 SAEs in 9 patients on 210 mg, and 7 SAEs in 6 patients on 490 mg). The most common SAE was asthma. One SAE of moderate livedo reticularis (70 mg) was considered a suspected unexpected serious adverse drug reaction (SUSAR) related to astegolimab and was reported two days after the second dose, leading to discontinuation of  astegolimab. Two (0.4%) patients reported anaphylaxis and hypersensitivity reactions: 1 severe SAE of anaphylactic reaction (placebo), and 1 moderate hypersensitivity (490 mg) considered related to astegolimab. Three (0.6%) patients experienced a potential Major Adverse Cardiac Event (MACE) (1 patient each in the placebo, 70 mg, and 210 mg groups). None of the potential MACE were considered related to astegolimab. Overall, 233 (46.4%) patients reported events of infection. A comparable number of patients reported infection across all treatment groups (65 [51.2%] patients on placebo, 55 [43.3%] patients on 70 mg, 58 [46.0%] patients on 210 mg, and 55 [45.1%] patients on 490 mg). The most frequently reported infection (>=10% incidence) was nasopharyngitis (12.7%). One patient on astegolimab 210 mg and the partner of one patient on placebo became pregnant during the study. Both delivered normal/healthy babies. Two deaths, unrelated to study drug, were reported: one patient on 210 mg astegolimab died following an SAE of asthma; the other patient on 490 mg astegolimab had an unexplained death. There were no clinically meaningful changes in laboratory parameters, vital signs, or ECG results, other than the 10% decrease in mean blood eosinophil counts in the astegolimab-treated groups, with no safety concerns. Treatment-induced ADAs were comparable between the astegolimab groups and had no impact on safety. Overall, astegolimab was well tolerated at all doses used and had a safety profile consistent with that observed in the previous astegolimab Phase I studies.  Serious Adverse Reactions Observed in Previous COPD Study: In the completed IIS Study HA59431, a total of 81 patients received at least one dose of astegolimab or placebo. The safety profile of astegolimab was similar to that of placebo. There were a total of 222 AEs reported in 62 patients. A total of 28 (72%) patients in the placebo arm and 34 (81%) patients  in  the astegolimab arm reported at least one AE. The most commonly reported AEs were headache followed by urinary tract infection and viral upper respiratory tract infection. A total of 39 SAEs were reported in 28 (35%) patients; 16 (41%) patients in the placebo group and 12 (29%) in the astegolimab group. The most commonly reported SAE was hospital admission for community acquired pneumonia. Four SAEs resulted in patient discontinuation from treatment (1 patient in the placebo group and 3 patients in the astegolimab group). One patient in the placebo group experienced an AESI of potential MACE (heart failure, unrelated to the study treatment). No anaphylaxis or pregnancies were reported. Two deaths, unrelated to study treatment, were reported: 1 patient in the placebo group died after hospital acquired pneumonia, and another patient in the placebo group died after pneumonia and type 2 respiratory failure.  Safety Data: Astegolimab has been generally well tolerated. There have been 49 patient deaths across all astegolimab studies (Section 5.5.2), none of which were considered related to astegolimab. A total of 144 subjects experienced a total of 224 SAEs across all astegolimab studies. Of these, an SAE livedo reticularis observed in 1 patient was considered related to astegolimab by the investigator (Section 5.5.3). AEs leading to withdrawal (Section 5.5.4) have generally occurred at a rate similar to what would be expected for clinical trials in the studies' respective indications.   Xxxxxxxxxxxxxxxxxxxxxxxxxxxxxxxxxxxxxxxxxxx  This visit for Subject Katie Greene with DOB: [birth year] on 02/10/2024 for the above protocol is Visit/Encounter # week 38  and is for purpose of reserch inection  . Subject/LAR expressed continued interest and consent in continuing as a study subject. Subject thanked for participation in research and contribution to science.     S: Endorse no interim complaints  Obj - looked  well  - exam not required per protocol  A REsearch COPD   Plan  - Personally injected SQ ijnjecti LLQ first: 11:17am 02/10/2024  RLQ 2nd: 11:18am 02/10/2024      SIGNATURE    Dr. Dorethia Cave, M.D., F.C.C.P, ACRP-CPI Pulmonary and Critical Care Medicine Research Investigator, [facility] Health Staff Physician, [facility] Center Director - Interstitial Lung Disease  Program  Pulmonary Fibrosis [facility] [place] [facility]monary and [facility] @ [facility] [place], [place], [address]   Pager: [phone], If no answer  OR between  19:00-7:00h: page [phone] Telephone (research): [phone]  12:17 PM 02/10/2024   12:17 PM 02/10/2024

## 2024-02-24 ENCOUNTER — Encounter: Admitting: Internal Medicine

## 2024-02-24 DIAGNOSIS — J449 Chronic obstructive pulmonary disease, unspecified: Secondary | ICD-10-CM

## 2024-02-24 MED ORDER — STUDY - ARNASA GB43374 (OPEN-LABEL) - ASTEGOLIMAB 238 MG/1.7 ML SQ INJECTION (PI-RAMASWAMY)
476.0000 mg | INJECTION | SUBCUTANEOUS | Status: AC
  Filled 2024-02-24: qty 3.4

## 2024-02-25 NOTE — Progress Notes (Signed)
 Katie Greene, DOB February 18, 1956, was seen as subject in a clinical trial /Protocol # [id number] Cardiopulmonary symptoms are stable.Other or new symptoms denied. Physical exam not indicated.  I observed injection of study agent into upper quadrant bilaterally without complication.                                                                     Elsie JULIANNA Roses MD,SI

## 2024-02-25 NOTE — Research (Signed)
 Title: A phase III Open-Label extension study to evaluate the long-term safety of Astegolimab in patiens with Chronic Obstructive Pulmonary disease.   Dose and Duration of Treatment: Astegolimab is presented as a sterile, slightly brown-yellow solution. Each single-use, 2.25 mL pre-filled syringe contains 1.7 mL deliverable volume. Astegolimab drug product is formulated at 140 mg/mL astegolimab with 114 mM succinic acid, 200 mM L-arginine, 10 mM L-methionine, 0.06% (w/v) polysorbate 20, pH 5.7. Placebo for astegolimab is supplied in an identical pre-filled syringe configuration.    Protocol # [id number] Sponsor: [employer] [id number] [place]   Protocol Version: v3 dated 15Oct2024 IB: version 9.0 dated April 2024 ICF:  ICFs: Make sure that the subject is re-consented with the updated ICFs Main ICF: [facility] [facility] Approved Version 01 Jul 2023, Revised 28Nov2024 Mobile Nursing: [employer] [facility] Approved Version 26Oct2023, Revised 08Aug2024 Optional Home Study Treatment: IRB Approved Version 28 May 2022 Revised 11 Mar 2023 ADDENDUM 1: IRB Approved Version 24Jul2024 Revised 8Aug2024 Lab Manual: Lab Manual v2.2.0 dated 05Dec2024        Mechanism of action: Astegolimab (also known as [id number] or [id number]) is a fully human, IgG2 monoclonal antibody that binds with high affinity to the interleukin (IL)-33 (IL-33) receptor, ST2, thereby blocking the signaling of IL-33, an inflammatory cytokine of the IL-1 family and member of the alarmin class of molecules. Astegolimab binds with high affinity to the human and cynomolgus monkey receptor for IL-33, ST2, and blocks IL-33 binding, thus inhibiting association with the IL-1R accessory protein (AcP) co-receptor and formation of an activated receptor complex.   Key Inclusion Criteria: Able and willing to provide written informed consent and to comply with the study protocol. Ability to comply with the requirements of study  protocol, according to the investigators's best judgement. Completion of the 52-week treatment period in either [place] or Study HA55667.   Key Exclusion Criteria: Significant non- compliance in the parent study, specifically defined as missing scheduled visits, per investigators judgement. Any new clinically significant pulmonary disease other than COPD since enrolling in the study. Unstable cardiac disease, myocardial infarction, or [place]  Heart Association Class III or IV heart failure since enrolling in the parent study.          Integrated Pharmacokinetic/Pharmacodynamic Analysis:  In Studies HA60757, [id number], and [id number], exploratory biomarker analysis showed there were consistent decreases in blood eosinophil counts throughout the treatment period, potentially mediated by a direct effect of IL-33 on eosinophil progenitors. In [place], there was no significant difference in fractional exhaled nitric oxide (FeNO) levels (reflecting airway IL-4/IL-13 activity) between astegolimab-treated groups relative to placebo throughout the treatment period. These data suggest that astegolimab has only a limited effect on Type 2 inflammation in asthma. No data are applicable for Study [id number]. No data are available yet for Study HA56688.   Special Warnings/Considerations: Administration of astegolimab, a protein therapeutic, may lead to the development of anti-astegolimab antibodies which could lead to AEs and/or decreased exposure. In non-clinical studies (see Section 4.2), ADA incidence has generally been low and there was no apparent ADA impact on PK and safety in these studies. To date, the immunogenicity rates observed with astegolimab in clinical studies have been relatively low as well (see Immunogenicity Section 5.6). Several clinical studies have been conducted in patients with asthma, atopic dermatitis, COPD (IIS Study HA59431) and COVID-19 severe pneumonia, which generally showed low  incidence of ADAs (Table 31). There has been no correlation between ADA status and clinical findings or increased incidence  of AEs. Astegolimab is now being considered in a larger study for the treatment of COPD. This patient population is typically considered to have a hyper-responsive immune system. Route of administration for this molecule will be [place], either Q2W or Q4W. These factors increase the risk of development of an immune response to astegolimab, specifically with repeat dosing. From the previous IIS Study HA59431, incidence of ADAs was low in COPD patients; this remains to be confirmed in a larger study. To monitor ADA development in ongoing studies, serum samples will be collected from patients at protocol-defined intervals. Patients who test positive for antibodies and have clinical sequelae that are considered potentially related to an ADA response may also be asked to return for additional follow-up testing.   Drug Interaction Studies: No PK drug interaction studies have been conducted to date.   Serious Adverse Reactions Observed in Asthma Study: During the treatment period, a similar proportion of patients across all cohorts experienced at least one AE, regardless of causality (Table 22). In total, 77.2%, 70.9%, 72.2%, and 72.1% of patients reported at least 1 AE in the placebo, 70 mg, 210 mg, and 490 mg groups, respectively. The most common AE's (>5% in any treatment group) were asthma, nasopharyngitis, upper respiratory tract infection, headache, and injection site reaction (ISR). The most common drug-related AE was ISR, which was reported more frequently in the astegolimab treatment groups than in the placebo group (1 [0.8%] patient in the placebo group, 10 [7.9%] patients with 70 mg, 8 [6.3%] patients with 210 mg, 6 [4.9%] patients with 490 mg). All ISRs were non-serious and mild or moderate in severity. During the treatment period, 50 SAEs were reported in 37 (7.4%) patients. The number of  patients reporting SAEs was comparable across all cohorts (11 SAEs in 8 patients on placebo, 21 SAEs in 14 patients on 70 mg, 11 SAEs in 9 patients on 210 mg, and 7 SAEs in 6 patients on 490 mg). The most common SAE was asthma. One SAE of moderate livedo reticularis (70 mg) was considered a suspected unexpected serious adverse drug reaction (SUSAR) related to astegolimab and was reported two days after the second dose, leading to discontinuation of astegolimab. Two (0.4%) patients reported anaphylaxis and hypersensitivity reactions: 1 severe SAE of anaphylactic reaction (placebo), and 1 moderate hypersensitivity (490 mg) considered related to astegolimab. Three (0.6%) patients experienced a potential Major Adverse Cardiac Event (MACE) (1 patient each in the placebo, 70 mg, and 210 mg groups). None of the potential MACE were considered related to astegolimab. Overall, 233 (46.4%) patients reported events of infection. A comparable number of patients reported infection across all treatment groups (65 [51.2%] patients on placebo, 55 [43.3%] patients on 70 mg, 58 [46.0%] patients on 210 mg, and 55 [45.1%] patients on 490 mg). The most frequently reported infection (>=10% incidence) was nasopharyngitis (12.7%). One patient on astegolimab 210 mg and the partner of one patient on placebo became pregnant during the study. Both delivered normal/healthy babies. Two deaths, unrelated to study drug, were reported: one patient on 210 mg astegolimab died following an SAE of asthma; the other patient on 490 mg astegolimab had an unexplained death. There were no clinically meaningful changes in laboratory parameters, vital signs, or ECG results, other than the 10% decrease in mean blood eosinophil counts in the astegolimab-treated groups, with no safety concerns. Treatment-induced ADAs were comparable between the astegolimab groups and had no impact on safety. Overall, astegolimab was well tolerated at all doses used and had a  safety  profile consistent with that observed in the previous astegolimab Phase I studies.   Serious Adverse Reactions Observed in Previous COPD Study: In the completed IIS Study HA59431, a total of 81 patients received at least one dose of astegolimab or placebo. The safety profile of astegolimab was similar to that of placebo. There were a total of 222 AEs reported in 62 patients. A total of 28 (72%) patients in the placebo arm and 34 (81%) patients in the astegolimab arm reported at least one AE. The most commonly reported AEs were headache followed by urinary tract infection and viral upper respiratory tract infection. A total of 39 SAEs were reported in 28 (35%) patients; 16 (41%) patients in the placebo group and 12 (29%) in the astegolimab group. The most commonly reported SAE was hospital admission for community acquired pneumonia. Four SAEs resulted in patient discontinuation from treatment (1 patient in the placebo group and 3 patients in the astegolimab group). One patient in the placebo group experienced an AESI of potential MACE (heart failure, unrelated to the study treatment). No anaphylaxis or pregnancies were reported. Two deaths, unrelated to study treatment, were reported: 1 patient in the placebo group died after hospital acquired pneumonia, and another patient in the placebo group died after pneumonia and type 2 respiratory failure.   Safety Data: Astegolimab has been generally well tolerated. There have been 49 patient deaths across all astegolimab studies (Section 5.5.2), none of which were considered related to astegolimab. A total of 144 subjects experienced a total of 224 SAEs across all astegolimab studies. Of these, an SAE livedo reticularis observed in 1 patient was considered related to astegolimab by the investigator (Section 5.5.3). AEs leading to withdrawal (Section 5.5.4) have generally occurred at a rate similar to what would be expected for clinical trials in the studies'  respective indications.   [facility] [place] Clinical Research Coordinator note :   This visit for Subject [id number] with DOB: 26/Aug/1960 on 24/Jul/2025 for the above protocol is Visit/Encounter # week 40 and is for purpose of research.    Subject expressed continued interest and consent in continuing as a study subject. Subject confirmed that there was no change in contact information (e.g. address, telephone, email). Subject thanked for participation in research and contribution to science.    The Subject was informed that the PI Dr. Geronimo continues to have oversight of the subject's visits and course  through relevant discussions, reviews and also specifically of this visit by routing of this note to the PI.

## 2024-03-08 ENCOUNTER — Other Ambulatory Visit: Payer: Self-pay | Admitting: Internal Medicine

## 2024-03-09 ENCOUNTER — Encounter: Admitting: Internal Medicine

## 2024-03-09 MED ORDER — STUDY - ARNASA GB43374 (OPEN-LABEL) - ASTEGOLIMAB 238 MG/1.7 ML SQ INJECTION (PI-RAMASWAMY)
476.0000 mg | INJECTION | SUBCUTANEOUS | Status: DC
  Administered 2024-04-06 – 2024-05-09 (×2): 476 mg via SUBCUTANEOUS
  Filled 2024-03-09: qty 3.4

## 2024-03-09 NOTE — Research (Signed)
 Title: A phase III Open-Label extension study to evaluate the long-term safety of Astegolimab in patients with Chronic Obstructive Pulmonary disease.   Dose and Duration of Treatment: Astegolimab is presented as a sterile, slightly brown-yellow solution. Each single-use, 2.25 mL pre-filled syringe contains 1.7 mL deliverable volume. Astegolimab drug product is formulated at 140 mg/mL astegolimab with 114 mM succinic acid, 200 mM L-arginine, 10 mM L-methionine, 0.06% (w/v) polysorbate 20, pH 5.7. Placebo for astegolimab is supplied in an identical pre-filled syringe configuration.   Protocol # [id number] Sponsor: [employer] [id number] [place]   Protocol Version: v3 dated 15Oct2024 IB: version 9.0 dated April 2024 ICF:  ICFs: Make sure that the subject is re-consented with the updated ICFs Main ICF: [employer] IRB Approved Version 01 Jul 2023, Revised 28Nov2024 Mobile Nursing: [employer] [facility] Approved Version 26Oct2023, Revised 08Aug2024 Optional Home Study Treatment: IRB Approved Version 28 May 2022 Revised 11 Mar 2023 ADDENDUM 1: IRB Approved Version 24Jul2024 Revised 8Aug2024 Lab Manual: V2.1.0 dated 21Jun2024   Mechanism of action: Astegolimab (also known as [id number] or [id number]) is a fully human, IgG2 monoclonal antibody that binds with high affinity to the interleukin (IL)-33 (IL-33) receptor, ST2, thereby blocking the signaling of IL-33, an inflammatory cytokine of the IL-1 family and member of the alarmin class of molecules. Astegolimab binds with high affinity to the human and cynomolgus monkey receptor for IL-33, ST2, and blocks IL-33 binding, thus inhibiting association with the IL-1R accessory protein (AcP) co-receptor and formation of an activated receptor complex.   Key Inclusion Criteria: Able and willing to provide written informed consent and to comply with the study protocol. Ability to comply with the requirements of study protocol, according  to the investigators's best judgement. Completion of the 52-week treatment period in either [place] or Study HA55667.   Key Exclusion Criteria: Significant non- compliance in the parent study, specifically defined as missing scheduled visits, per investigators judgement. Any new clinically significant pulmonary disease other than COPD since enrolling in the study. Unstable cardiac disease, myocardial infarction, or [place]  Heart Association Class III or IV heart failure since enrolling in the parent study.   Integrated Pharmacokinetic/Pharmacodynamic Analysis:  In Studies HA60757, [id number], and [id number], exploratory biomarker analysis showed there were consistent decreases in blood eosinophil counts throughout the treatment period, potentially mediated by a direct effect of IL-33 on eosinophil progenitors. In [place], there was no significant difference in fractional exhaled nitric oxide (FeNO) levels (reflecting airway IL-4/IL-13 activity) between astegolimab-treated groups relative to placebo throughout the treatment period. These data suggest that astegolimab has only a limited effect on Type 2 inflammation in asthma. No data are applicable for Study [id number]. No data are available yet for Study HA56688.   Special Warnings/Considerations: Administration of astegolimab, a protein therapeutic, may lead to the development of anti-astegolimab antibodies which could lead to AEs and/or decreased exposure. In non-clinical studies (see Section 4.2), ADA incidence has generally been low and there was no apparent ADA impact on PK and safety in these studies. To date, the immunogenicity rates observed with astegolimab in clinical studies have been relatively low as well (see Immunogenicity Section 5.6). Several clinical studies have been conducted in patients with asthma, atopic dermatitis, COPD (IIS Study HA59431) and COVID-19 severe pneumonia, which generally showed low incidence of ADAs (Table 31).  There has been no correlation between ADA status and clinical findings or increased incidence of AEs. Astegolimab is now being considered in a larger study for the treatment of  COPD. This patient population is typically considered to have a hyper-responsive immune system. Route of administration for this molecule will be [place], either Q2W or Q4W. These factors increase the risk of development of an immune response to astegolimab, specifically with repeat dosing. From the previous IIS Study HA59431, incidence of ADAs was low in COPD patients; this remains to be confirmed in a larger study. To monitor ADA development in ongoing studies, serum samples will be collected from patients at protocol-defined intervals. Patients who test positive for antibodies and have clinical sequelae that are considered potentially related to an ADA response may also be asked to return for additional follow-up testing.   Drug Interaction Studies: No PK drug interaction studies have been conducted to date.   Serious Adverse Reactions Observed in Asthma Study: During the treatment period, a similar proportion of patients across all cohorts experienced at least one AE, regardless of causality (Table 22). In total, 77.2%, 70.9%, 72.2%, and 72.1% of patients reported at least 1 AE in the placebo, 70 mg, 210 mg, and 490 mg groups, respectively. The most common AE's (>5% in any treatment group) were asthma, nasopharyngitis, upper respiratory tract infection, headache, and injection site reaction (ISR). The most common drug-related AE was ISR, which was reported more frequently in the astegolimab treatment groups than in the placebo group (1 [0.8%] patient in the placebo group, 10 [7.9%] patients with 70 mg, 8 [6.3%] patients with 210 mg, 6 [4.9%] patients with 490 mg). All ISRs were non-serious and mild or moderate in severity. During the treatment period, 50 SAEs were reported in 37 (7.4%) patients. The number of patients reporting SAEs was  comparable across all cohorts (11 SAEs in 8 patients on placebo, 21 SAEs in 14 patients on 70 mg, 11 SAEs in 9 patients on 210 mg, and 7 SAEs in 6 patients on 490 mg). The most common SAE was asthma. One SAE of moderate livedo reticularis (70 mg) was considered a suspected unexpected serious adverse drug reaction (SUSAR) related to astegolimab and was reported two days after the second dose, leading to discontinuation of astegolimab. Two (0.4%) patients reported anaphylaxis and hypersensitivity reactions: 1 severe SAE of anaphylactic reaction (placebo), and 1 moderate hypersensitivity (490 mg) considered related to astegolimab. Three (0.6%) patients experienced a potential Major Adverse Cardiac Event (MACE) (1 patient each in the placebo, 70 mg, and 210 mg groups). None of the potential MACE were considered related to astegolimab. Overall, 233 (46.4%) patients reported events of infection. A comparable number of patients reported infection across all treatment groups (65 [51.2%] patients on placebo, 55 [43.3%] patients on 70 mg, 58 [46.0%] patients on 210 mg, and 55 [45.1%] patients on 490 mg). The most frequently reported infection (>=10% incidence) was nasopharyngitis (12.7%). One patient on astegolimab 210 mg and the partner of one patient on placebo became pregnant during the study. Both delivered normal/healthy babies. Two deaths, unrelated to study drug, were reported: one patient on 210 mg astegolimab died following an SAE of asthma; the other patient on 490 mg astegolimab had an unexplained death. There were no clinically meaningful changes in laboratory parameters, vital signs, or ECG results, other than the 10% decrease in mean blood eosinophil counts in the astegolimab-treated groups, with no safety concerns. Treatment-induced ADAs were comparable between the astegolimab groups and had no impact on safety. Overall, astegolimab was well tolerated at all doses used and had a safety profile consistent with  that observed in the previous astegolimab Phase I studies.   Serious  Adverse Reactions Observed in Previous COPD Study: In the completed IIS Study HA59431, a total of 81 patients received at least one dose of astegolimab or placebo. The safety profile of astegolimab was similar to that of placebo. There were a total of 222 AEs reported in 62 patients. A total of 28 (72%) patients in the placebo arm and 34 (81%) patients in the astegolimab arm reported at least one AE. The most commonly reported AEs were headache followed by urinary tract infection and viral upper respiratory tract infection. A total of 39 SAEs were reported in 28 (35%) patients; 16 (41%) patients in the placebo group and 12 (29%) in the astegolimab group. The most commonly reported SAE was hospital admission for community acquired pneumonia. Four SAEs resulted in patient discontinuation from treatment (1 patient in the placebo group and 3 patients in the astegolimab group). One patient in the placebo group experienced an AESI of potential MACE (heart failure, unrelated to the study treatment). No anaphylaxis or pregnancies were reported. Two deaths, unrelated to study treatment, were reported: 1 patient in the placebo group died after hospital acquired pneumonia, and another patient in the placebo group died after pneumonia and type 2 respiratory failure.   Safety Data: Astegolimab has been generally well tolerated. There have been 49 patient deaths across all astegolimab studies (Section 5.5.2), none of which were considered related to astegolimab. A total of 144 subjects experienced a total of 224 SAEs across all astegolimab studies. Of these, an SAE livedo reticularis observed in 1 patient was considered related to astegolimab by the investigator (Section 5.5.3). AEs leading to withdrawal (Section 5.5.4) have generally occurred at a rate similar to what would be expected for clinical trials in the studies' respective indications.   [facility]  @ [place] Clinical Research Coordinator note:    This visit for Subject: [id number] DOB: 1956-06-13 on 07Aug2025 for the above protocol is Visit/Encounter week 42 and is for the purpose of research.    Subject expressed continued interest and consent in continuing as a study subject. Subject confirmed that there was no change in contact information (e.g. address, telephone, email). Subject thanked for participation in research and contribution to science. The Subject was informed that the PI Dr. Geronimo continues to have oversight of the subject's visits and course through relevant discussions, reviews and specifically of this visit by routing of this note to the PI.  All assessments and procedures were performed per above stated protocol. Subject tolerated injections well. Please refer to subject's paper source binder for further details of this visit.   Signed by Octaviano Paras, CCRP, COT Clinical Research Coordinator  [facility]  [place], [place]

## 2024-03-11 DIAGNOSIS — S81811A Laceration without foreign body, right lower leg, initial encounter: Secondary | ICD-10-CM | POA: Diagnosis not present

## 2024-03-22 DIAGNOSIS — H25043 Posterior subcapsular polar age-related cataract, bilateral: Secondary | ICD-10-CM | POA: Diagnosis not present

## 2024-03-22 DIAGNOSIS — Z01818 Encounter for other preprocedural examination: Secondary | ICD-10-CM | POA: Diagnosis not present

## 2024-03-23 ENCOUNTER — Encounter: Admitting: Internal Medicine

## 2024-03-23 DIAGNOSIS — J440 Chronic obstructive pulmonary disease with acute lower respiratory infection: Secondary | ICD-10-CM

## 2024-03-23 DIAGNOSIS — Z006 Encounter for examination for normal comparison and control in clinical research program: Secondary | ICD-10-CM

## 2024-03-23 MED ORDER — STUDY - ARNASA GB43374 (OPEN-LABEL) - ASTEGOLIMAB 238 MG/1.7 ML SQ INJECTION (PI-RAMASWAMY)
476.0000 mg | INJECTION | SUBCUTANEOUS | Status: AC
  Administered 2024-03-23: 476 mg via SUBCUTANEOUS
  Filled 2024-03-23: qty 3.4

## 2024-03-23 NOTE — Research (Addendum)
 Title: A phase III Open-Label extension study to evaluate the long-term safety of Astegolimab in patients with Chronic Obstructive Pulmonary disease.   Dose and Duration of Treatment: Astegolimab is presented as a sterile, slightly brown-yellow solution. Each single-use, 2.25 mL pre-filled syringe contains 1.7 mL deliverable volume. Astegolimab drug product is formulated at 140 mg/mL astegolimab with 114 mM succinic acid, 200 mM L-arginine, 10 mM L-methionine, 0.06% (w/v) polysorbate 20, pH 5.7. Placebo for astegolimab is supplied in an identical pre-filled syringe configuration.   Protocol # [id number] Sponsor: [employer] [address], [place]   Protocol Version: v3 dated 15Oct2024 IB: version 9.0 dated April 2024 ICF:  ICFs: Make sure that the subject is re-consented with the updated ICFs Main ICF: [facility] IR[facility] Approved Version 01 Jul 2023, Revised 28Nov2024 Mobile Nursing: [employer] IR[facility] Approved Version 26Oct2023, Revised 08Aug2024 Optional Home Study Treatment: IRB Approved Version 28 May 2022 Revised 11 Mar 2023 ADDENDUM 1: IRB Approved Version 24Jul2024 Revised 8Aug2024 Lab Manual: V2.1.0 dated 21Jun2024   Mechanism of action: Astegolimab (also known as [id number] or [id number]) is a fully human, IgG2 monoclonal antibody that binds with high affinity to the interleukin (IL)-33 (IL-33) receptor, ST2, thereby blocking the signaling of IL-33, an inflammatory cytokine of the IL-1 family and member of the alarmin class of molecules. Astegolimab binds with high affinity to the human and cynomolgus monkey receptor for IL-33, ST2, and blocks IL-33 binding, thus inhibiting association with the IL-1R accessory protein (AcP) co-receptor and formation of an activated receptor complex.   Key Inclusion Criteria: Able and willing to provide written informed consent and to comply with the study protocol. Ability to comply with the requirements of study protocol, according  to the investigators's best judgement. Completion of the 52-week treatment period in either [place] or Study HA55667.   Key Exclusion Criteria: Significant non- compliance in the parent study, specifically defined as missing scheduled visits, per investigators judgement. Any new clinically significant pulmonary disease other than COPD since enrolling in the study. Unstable cardiac disease, myocardial infarction, or [place]  Heart Association Class III or IV heart failure since enrolling in the parent study.   Integrated Pharmacokinetic/Pharmacodynamic Analysis:  In Studies HA60757, [id number], and [id number], exploratory biomarker analysis showed there were consistent decreases in blood eosinophil counts throughout the treatment period, potentially mediated by a direct effect of IL-33 on eosinophil progenitors. In [place], there was no significant difference in fractional exhaled nitric oxide (FeNO) levels (reflecting airway IL-4/IL-13 activity) between astegolimab-treated groups relative to placebo throughout the treatment period. These data suggest that astegolimab has only a limited effect on Type 2 inflammation in asthma. No data are applicable for Study [id number]. No data are available yet for Study HA56688.   Special Warnings/Considerations: Administration of astegolimab, a protein therapeutic, may lead to the development of anti-astegolimab antibodies which could lead to AEs and/or decreased exposure. In non-clinical studies (see Section 4.2), ADA incidence has generally been low and there was no apparent ADA impact on PK and safety in these studies. To date, the immunogenicity rates observed with astegolimab in clinical studies have been relatively low as well (see Immunogenicity Section 5.6). Several clinical studies have been conducted in patients with asthma, atopic dermatitis, COPD (IIS Study HA59431) and COVID-19 severe pneumonia, which generally showed low incidence of ADAs (Table 31).  There has been no correlation between ADA status and clinical findings or increased incidence of AEs. Astegolimab is now being considered in a larger study for the treatment of  COPD. This patient population is typically considered to have a hyper-responsive immune system. Route of administration for this molecule will be [place], either Q2W or Q4W. These factors increase the risk of development of an immune response to astegolimab, specifically with repeat dosing. From the previous IIS Study HA59431, incidence of ADAs was low in COPD patients; this remains to be confirmed in a larger study. To monitor ADA development in ongoing studies, serum samples will be collected from patients at protocol-defined intervals. Patients who test positive for antibodies and have clinical sequelae that are considered potentially related to an ADA response may also be asked to return for additional follow-up testing.   Drug Interaction Studies: No PK drug interaction studies have been conducted to date.   Serious Adverse Reactions Observed in Asthma Study: During the treatment period, a similar proportion of patients across all cohorts experienced at least one AE, regardless of causality (Table 22). In total, 77.2%, 70.9%, 72.2%, and 72.1% of patients reported at least 1 AE in the placebo, 70 mg, 210 mg, and 490 mg groups, respectively. The most common AE's (>5% in any treatment group) were asthma, nasopharyngitis, upper respiratory tract infection, headache, and injection site reaction (ISR). The most common drug-related AE was ISR, which was reported more frequently in the astegolimab treatment groups than in the placebo group (1 [0.8%] patient in the placebo group, 10 [7.9%] patients with 70 mg, 8 [6.3%] patients with 210 mg, 6 [4.9%] patients with 490 mg). All ISRs were non-serious and mild or moderate in severity. During the treatment period, 50 SAEs were reported in 37 (7.4%) patients. The number of patients reporting SAEs was  comparable across all cohorts (11 SAEs in 8 patients on placebo, 21 SAEs in 14 patients on 70 mg, 11 SAEs in 9 patients on 210 mg, and 7 SAEs in 6 patients on 490 mg). The most common SAE was asthma. One SAE of moderate livedo reticularis (70 mg) was considered a suspected unexpected serious adverse drug reaction (SUSAR) related to astegolimab and was reported two days after the second dose, leading to discontinuation of astegolimab. Two (0.4%) patients reported anaphylaxis and hypersensitivity reactions: 1 severe SAE of anaphylactic reaction (placebo), and 1 moderate hypersensitivity (490 mg) considered related to astegolimab. Three (0.6%) patients experienced a potential Major Adverse Cardiac Event (MACE) (1 patient each in the placebo, 70 mg, and 210 mg groups). None of the potential MACE were considered related to astegolimab. Overall, 233 (46.4%) patients reported events of infection. A comparable number of patients reported infection across all treatment groups (65 [51.2%] patients on placebo, 55 [43.3%] patients on 70 mg, 58 [46.0%] patients on 210 mg, and 55 [45.1%] patients on 490 mg). The most frequently reported infection (>=10% incidence) was nasopharyngitis (12.7%). One patient on astegolimab 210 mg and the partner of one patient on placebo became pregnant during the study. Both delivered normal/healthy babies. Two deaths, unrelated to study drug, were reported: one patient on 210 mg astegolimab died following an SAE of asthma; the other patient on 490 mg astegolimab had an unexplained death. There were no clinically meaningful changes in laboratory parameters, vital signs, or ECG results, other than the 10% decrease in mean blood eosinophil counts in the astegolimab-treated groups, with no safety concerns. Treatment-induced ADAs were comparable between the astegolimab groups and had no impact on safety. Overall, astegolimab was well tolerated at all doses used and had a safety profile consistent with  that observed in the previous astegolimab Phase I studies.   Serious  Adverse Reactions Observed in Previous COPD Study: In the completed IIS Study HA59431, a total of 81 patients received at least one dose of astegolimab or placebo. The safety profile of astegolimab was similar to that of placebo. There were a total of 222 AEs reported in 62 patients. A total of 28 (72%) patients in the placebo arm and 34 (81%) patients in the astegolimab arm reported at least one AE. The most commonly reported AEs were headache followed by urinary tract infection and viral upper respiratory tract infection. A total of 39 SAEs were reported in 28 (35%) patients; 16 (41%) patients in the placebo group and 12 (29%) in the astegolimab group. The most commonly reported SAE was hospital admission for community acquired pneumonia. Four SAEs resulted in patient discontinuation from treatment (1 patient in the placebo group and 3 patients in the astegolimab group). One patient in the placebo group experienced an AESI of potential MACE (heart failure, unrelated to the study treatment). No anaphylaxis or pregnancies were reported. Two deaths, unrelated to study treatment, were reported: 1 patient in the placebo group died after hospital acquired pneumonia, and another patient in the placebo group died after pneumonia and type 2 respiratory failure.   Safety Data: Astegolimab has been generally well tolerated. There have been 49 patient deaths across all astegolimab studies (Section 5.5.2), none of which were considered related to astegolimab. A total of 144 subjects experienced a total of 224 SAEs across all astegolimab studies. Of these, an SAE livedo reticularis observed in 1 patient was considered related to astegolimab by the investigator (Section 5.5.3). AEs leading to withdrawal (Section 5.5.4) have generally occurred at a rate similar to what would be expected for clinical trials in the studies' respective indications.   [facility]  @  Clinical Research Coordinator note:    This visit for Subject: [id number] DOB: Oct 06, 1955 on 21Aug2025 for the above protocol is Visit/Encounter week 44 and is for the purpose of research.    Subject expressed continued interest and consent in continuing as a study subject. Subject confirmed that there was no change in contact information (e.g. address, telephone, email). Subject thanked for participation in research and contribution to science. The Subject was informed that the PI Dr. Geronimo continues to have oversight of the subject's visits and course through relevant discussions, reviews and specifically of this visit by routing of this note to the PI.  All assessments and procedures were performed per above stated protocol. Subject tolerated injections well. Please refer to subject's paper source binder for further details of this visit.   Signed by Katie Greene, CCRP, COT Clinical Research Coordinator  [facility]  [place], [place]

## 2024-03-30 DIAGNOSIS — H25812 Combined forms of age-related cataract, left eye: Secondary | ICD-10-CM | POA: Diagnosis not present

## 2024-03-30 DIAGNOSIS — H2512 Age-related nuclear cataract, left eye: Secondary | ICD-10-CM | POA: Diagnosis not present

## 2024-03-30 DIAGNOSIS — H25042 Posterior subcapsular polar age-related cataract, left eye: Secondary | ICD-10-CM | POA: Diagnosis not present

## 2024-03-31 DIAGNOSIS — Z9842 Cataract extraction status, left eye: Secondary | ICD-10-CM | POA: Diagnosis not present

## 2024-03-31 DIAGNOSIS — H25041 Posterior subcapsular polar age-related cataract, right eye: Secondary | ICD-10-CM | POA: Diagnosis not present

## 2024-04-06 ENCOUNTER — Encounter: Admitting: Internal Medicine

## 2024-04-06 DIAGNOSIS — Z006 Encounter for examination for normal comparison and control in clinical research program: Secondary | ICD-10-CM | POA: Insufficient documentation

## 2024-04-06 MED ORDER — STUDY - ARNASA GB43374 (OPEN-LABEL) - ASTEGOLIMAB 238 MG/1.7 ML SQ INJECTION (PI-RAMASWAMY)
476.0000 mg | INJECTION | SUBCUTANEOUS | Status: AC
  Administered 2024-04-06 – 2024-05-11 (×3): 476 mg via SUBCUTANEOUS
  Filled 2024-04-06: qty 3.4

## 2024-04-06 NOTE — Research (Addendum)
 Title: A phase III Open-Label extension study to evaluate the long-term safety of Astegolimab in patients with Chronic Obstructive Pulmonary disease.   Dose and Duration of Treatment: Astegolimab is presented as a sterile, slightly brown-yellow solution. Each single-use, 2.25 mL pre-filled syringe contains 1.7 mL deliverable volume. Astegolimab drug product is formulated at 140 mg/mL astegolimab with 114 mM succinic acid, 200 mM L-arginine, 10 mM L-methionine, 0.06% (w/v) polysorbate 20, pH 5.7. Placebo for astegolimab is supplied in an identical pre-filled syringe configuration.   Protocol # [id number] Sponsor: [employer] [address], [place]   Protocol Version: v3 dated 15Oct2024 IB: version 9.0 dated April 2024 ICF:  ICFs: Make sure that the subject is re-consented with the updated ICFs Main ICF: [facility] IRB Approved Version 01 Jul 2023, Revised 28Nov2024 Mobile Nursing: [facility] Approved Version 26Oct2023, Revised 08Aug2024 Optional Home Study Treatment: IRB Approved Version 28 May 2022 Revised 11 Mar 2023 ADDENDUM 1: IRB Approved Version 24Jul2024 Revised 8Aug2024 Lab Manual: V2.1.0 dated 21Jun2024   Mechanism of action: Astegolimab (also known as [id number] or [id number]) is a fully human, IgG2 monoclonal antibody that binds with high affinity to the interleukin (IL)-33 (IL-33) receptor, ST2, thereby blocking the signaling of IL-33, an inflammatory cytokine of the IL-1 family and member of the alarmin class of molecules. Astegolimab binds with high affinity to the human and cynomolgus monkey receptor for IL-33, ST2, and blocks IL-33 binding, thus inhibiting association with the IL-1R accessory protein (AcP) co-receptor and formation of an activated receptor complex.   Key Inclusion Criteria: Able and willing to provide written informed consent and to comply with the study protocol. Ability to comply with the requirements of study protocol, according  to the investigators's best judgement. Completion of the 52-week treatment period in either [place] or Study HA55667.   Key Exclusion Criteria: Significant non- compliance in the parent study, specifically defined as missing scheduled visits, per investigators judgement. Any new clinically significant pulmonary disease other than COPD since enrolling in the study. Unstable cardiac disease, myocardial infarction, or [place]  Heart Association Class III or IV heart failure since enrolling in the parent study.   Integrated Pharmacokinetic/Pharmacodynamic Analysis:  In Studies HA60757, [id number], and [id number], exploratory biomarker analysis showed there were consistent decreases in blood eosinophil counts throughout the treatment period, potentially mediated by a direct effect of IL-33 on eosinophil progenitors. In [place], there was no significant difference in fractional exhaled nitric oxide (FeNO) levels (reflecting airway IL-4/IL-13 activity) between astegolimab-treated groups relative to placebo throughout the treatment period. These data suggest that astegolimab has only a limited effect on Type 2 inflammation in asthma. No data are applicable for Study [id number]. No data are available yet for Study HA56688.   Special Warnings/Considerations: Administration of astegolimab, a protein therapeutic, may lead to the development of anti-astegolimab antibodies which could lead to AEs and/or decreased exposure. In non-clinical studies (see Section 4.2), ADA incidence has generally been low and there was no apparent ADA impact on PK and safety in these studies. To date, the immunogenicity rates observed with astegolimab in clinical studies have been relatively low as well (see Immunogenicity Section 5.6). Several clinical studies have been conducted in patients with asthma, atopic dermatitis, COPD (IIS Study HA59431) and COVID-19 severe pneumonia, which generally showed low incidence of ADAs (Table 31).  There has been no correlation between ADA status and clinical findings or increased incidence of AEs. Astegolimab is now being considered in a larger study for the treatment of  COPD. This patient population is typically considered to have a hyper-responsive immune system. Route of administration for this molecule will be [place], either Q2W or Q4W. These factors increase the risk of development of an immune response to astegolimab, specifically with repeat dosing. From the previous IIS Study HA59431, incidence of ADAs was low in COPD patients; this remains to be confirmed in a larger study. To monitor ADA development in ongoing studies, serum samples will be collected from patients at protocol-defined intervals. Patients who test positive for antibodies and have clinical sequelae that are considered potentially related to an ADA response may also be asked to return for additional follow-up testing.   Drug Interaction Studies: No PK drug interaction studies have been conducted to date.   Serious Adverse Reactions Observed in Asthma Study: During the treatment period, a similar proportion of patients across all cohorts experienced at least one AE, regardless of causality (Table 22). In total, 77.2%, 70.9%, 72.2%, and 72.1% of patients reported at least 1 AE in the placebo, 70 mg, 210 mg, and 490 mg groups, respectively. The most common AE's (>5% in any treatment group) were asthma, nasopharyngitis, upper respiratory tract infection, headache, and injection site reaction (ISR). The most common drug-related AE was ISR, which was reported more frequently in the astegolimab treatment groups than in the placebo group (1 [0.8%] patient in the placebo group, 10 [7.9%] patients with 70 mg, 8 [6.3%] patients with 210 mg, 6 [4.9%] patients with 490 mg). All ISRs were non-serious and mild or moderate in severity. During the treatment period, 50 SAEs were reported in 37 (7.4%) patients. The number of patients reporting SAEs was  comparable across all cohorts (11 SAEs in 8 patients on placebo, 21 SAEs in 14 patients on 70 mg, 11 SAEs in 9 patients on 210 mg, and 7 SAEs in 6 patients on 490 mg). The most common SAE was asthma. One SAE of moderate livedo reticularis (70 mg) was considered a suspected unexpected serious adverse drug reaction (SUSAR) related to astegolimab and was reported two days after the second dose, leading to discontinuation of astegolimab. Two (0.4%) patients reported anaphylaxis and hypersensitivity reactions: 1 severe SAE of anaphylactic reaction (placebo), and 1 moderate hypersensitivity (490 mg) considered related to astegolimab. Three (0.6%) patients experienced a potential Major Adverse Cardiac Event (MACE) (1 patient each in the placebo, 70 mg, and 210 mg groups). None of the potential MACE were considered related to astegolimab. Overall, 233 (46.4%) patients reported events of infection. A comparable number of patients reported infection across all treatment groups (65 [51.2%] patients on placebo, 55 [43.3%] patients on 70 mg, 58 [46.0%] patients on 210 mg, and 55 [45.1%] patients on 490 mg). The most frequently reported infection (>=10% incidence) was nasopharyngitis (12.7%). One patient on astegolimab 210 mg and the partner of one patient on placebo became pregnant during the study. Both delivered normal/healthy babies. Two deaths, unrelated to study drug, were reported: one patient on 210 mg astegolimab died following an SAE of asthma; the other patient on 490 mg astegolimab had an unexplained death. There were no clinically meaningful changes in laboratory parameters, vital signs, or ECG results, other than the 10% decrease in mean blood eosinophil counts in the astegolimab-treated groups, with no safety concerns. Treatment-induced ADAs were comparable between the astegolimab groups and had no impact on safety. Overall, astegolimab was well tolerated at all doses used and had a safety profile consistent with  that observed in the previous astegolimab Phase I studies.   Serious  Adverse Reactions Observed in Previous COPD Study: In the completed IIS Study HA59431, a total of 81 patients received at least one dose of astegolimab or placebo. The safety profile of astegolimab was similar to that of placebo. There were a total of 222 AEs reported in 62 patients. A total of 28 (72%) patients in the placebo arm and 34 (81%) patients in the astegolimab arm reported at least one AE. The most commonly reported AEs were headache followed by urinary tract infection and viral upper respiratory tract infection. A total of 39 SAEs were reported in 28 (35%) patients; 16 (41%) patients in the placebo group and 12 (29%) in the astegolimab group. The most commonly reported SAE was hospital admission for community acquired pneumonia. Four SAEs resulted in patient discontinuation from treatment (1 patient in the placebo group and 3 patients in the astegolimab group). One patient in the placebo group experienced an AESI of potential MACE (heart failure, unrelated to the study treatment). No anaphylaxis or pregnancies were reported. Two deaths, unrelated to study treatment, were reported: 1 patient in the placebo group died after hospital acquired pneumonia, and another patient in the placebo group died after pneumonia and type 2 respiratory failure.   Safety Data: Astegolimab has been generally well tolerated. There have been 49 patient deaths across all astegolimab studies (Section 5.5.2), none of which were considered related to astegolimab. A total of 144 subjects experienced a total of 224 SAEs across all astegolimab studies. Of these, an SAE livedo reticularis observed in 1 patient was considered related to astegolimab by the investigator (Section 5.5.3). AEs leading to withdrawal (Section 5.5.4) have generally occurred at a rate similar to what would be expected for clinical trials in the studies' respective indications.   [facility]  @ [place] Clinical Research Coordinator note:    This visit for Subject: [id number] DOB: [birth year] on 04Sep2025 for the above protocol is Visit/Encounter week 46 and is for the purpose of research.    Subject expressed continued interest and consent in continuing as a study subject. Subject confirmed that there was no change in contact information (e.g. address, telephone, email). Subject thanked for participation in research and contribution to science. The Subject was informed that the PI Dr. Geronimo continues to have oversight of the subject's visits and course through relevant discussions, reviews and specifically of this visit by routing of this note to the PI.  All assessments and procedures were performed per above stated protocol. Subject tolerated injections well. Please refer to subject's paper source binder for further details of this visit.   Signed by Octaviano Paras, CCRP, COT Clinical Research Coordinator  [facility]  [place], [place]

## 2024-04-07 DIAGNOSIS — H25041 Posterior subcapsular polar age-related cataract, right eye: Secondary | ICD-10-CM | POA: Diagnosis not present

## 2024-04-07 DIAGNOSIS — J449 Chronic obstructive pulmonary disease, unspecified: Secondary | ICD-10-CM | POA: Diagnosis not present

## 2024-04-07 DIAGNOSIS — Z9842 Cataract extraction status, left eye: Secondary | ICD-10-CM | POA: Diagnosis not present

## 2024-04-12 DIAGNOSIS — Z1231 Encounter for screening mammogram for malignant neoplasm of breast: Secondary | ICD-10-CM | POA: Diagnosis not present

## 2024-04-20 ENCOUNTER — Encounter: Admitting: Internal Medicine

## 2024-04-20 DIAGNOSIS — Z006 Encounter for examination for normal comparison and control in clinical research program: Secondary | ICD-10-CM

## 2024-04-20 MED ORDER — STUDY - ARNASA GB43374 (OPEN-LABEL) - ASTEGOLIMAB 238 MG/1.7 ML SQ INJECTION (PI-RAMASWAMY)
476.0000 mg | INJECTION | SUBCUTANEOUS | Status: AC
  Administered 2024-04-20: 476 mg via SUBCUTANEOUS
  Filled 2024-04-20: qty 3.4

## 2024-04-20 NOTE — Research (Signed)
 Title: A phase III Open-Label extension study to evaluate the long-term safety of Astegolimab in patients with Chronic Obstructive Pulmonary disease.   Dose and Duration of Treatment: Astegolimab is presented as a sterile, slightly brown-yellow solution. Each single-use, 2.25 mL pre-filled syringe contains 1.7 mL deliverable volume. Astegolimab drug product is formulated at 140 mg/mL astegolimab with 114 mM succinic acid, 200 mM L-arginine, 10 mM L-methionine, 0.06% (w/v) polysorbate 20, pH 5.7. Placebo for astegolimab is supplied in an identical pre-filled syringe configuration.   Protocol # [id number] Sponsor: [employer] [address], [place]   Protocol Version: v3 dated 15Oct2024 IB: version 9.0 dated April 2024 ICF:  ICFs: Make sure that the subject is re-consented with the updated ICFs Main ICF: [employer] IR[facility] Approved Version 01 Jul 2023, Revised 28Nov2024 Mobile Nursing: [employer] IR[facility] Approved Version 26Oct2023, Revised 08Aug2024 Optional Home Study Treatment: IRB Approved Version 28 May 2022 Revised 11 Mar 2023 ADDENDUM 1: IRB Approved Version 24Jul2024 Revised 8Aug2024 Lab Manual: V2.1.0 dated 21Jun2024   Mechanism of action: Astegolimab (also known as [id number] or [id number]) is a fully human, IgG2 monoclonal antibody that binds with high affinity to the interleukin (IL)-33 (IL-33) receptor, ST2, thereby blocking the signaling of IL-33, an inflammatory cytokine of the IL-1 family and member of the alarmin class of molecules. Astegolimab binds with high affinity to the human and cynomolgus monkey receptor for IL-33, ST2, and blocks IL-33 binding, thus inhibiting association with the IL-1R accessory protein (AcP) co-receptor and formation of an activated receptor complex.   Key Inclusion Criteria: Able and willing to provide written informed consent and to comply with the study protocol. Ability to comply with the requirements of study protocol, according  to the investigators's best judgement. Completion of the 52-week treatment period in either [place] or Study HA55667.   Key Exclusion Criteria: Significant non- compliance in the parent study, specifically defined as missing scheduled visits, per investigators judgement. Any new clinically significant pulmonary disease other than COPD since enrolling in the study. Unstable cardiac disease, myocardial infarction, or [place]  Heart Association Class III or IV heart failure since enrolling in the parent study.   Integrated Pharmacokinetic/Pharmacodynamic Analysis:  In Studies HA60757, K3610841, and [id number], exploratory biomarker analysis showed there were consistent decreases in blood eosinophil counts throughout the treatment period, potentially mediated by a direct effect of IL-33 on eosinophil progenitors. In [place], there was no significant difference in fractional exhaled nitric oxide (FeNO) levels (reflecting airway IL-4/IL-13 activity) between astegolimab-treated groups relative to placebo throughout the treatment period. These data suggest that astegolimab has only a limited effect on Type 2 inflammation in asthma. No data are applicable for Study [id number]. No data are available yet for Study [id number].   Special Warnings/Considerations: Administration of astegolimab, a protein therapeutic, may lead to the development of anti-astegolimab antibodies which could lead to AEs and/or decreased exposure. In non-clinical studies (see Section 4.2), ADA incidence has generally been low and there was no apparent ADA impact on PK and safety in these studies. To date, the immunogenicity rates observed with astegolimab in clinical studies have been relatively low as well (see Immunogenicity Section 5.6). Several clinical studies have been conducted in patients with asthma, atopic dermatitis, COPD (IIS Study HA59431) and COVID-19 severe pneumonia, which generally showed low incidence of ADAs (Table 31).  There has been no correlation between ADA status and clinical findings or increased incidence of AEs. Astegolimab is now being considered in a larger study for the treatment of  COPD. This patient population is typically considered to have a hyper-responsive immune system. Route of administration for this molecule will be [place], either Q2W or Q4W. These factors increase the risk of development of an immune response to astegolimab, specifically with repeat dosing. From the previous IIS Study HA59431, incidence of ADAs was low in COPD patients; this remains to be confirmed in a larger study. To monitor ADA development in ongoing studies, serum samples will be collected from patients at protocol-defined intervals. Patients who test positive for antibodies and have clinical sequelae that are considered potentially related to an ADA response may also be asked to return for additional follow-up testing.   Drug Interaction Studies: No PK drug interaction studies have been conducted to date.   Serious Adverse Reactions Observed in Asthma Study: During the treatment period, a similar proportion of patients across all cohorts experienced at least one AE, regardless of causality (Table 22). In total, 77.2%, 70.9%, 72.2%, and 72.1% of patients reported at least 1 AE in the placebo, 70 mg, 210 mg, and 490 mg groups, respectively. The most common AE's (>5% in any treatment group) were asthma, nasopharyngitis, upper respiratory tract infection, headache, and injection site reaction (ISR). The most common drug-related AE was ISR, which was reported more frequently in the astegolimab treatment groups than in the placebo group (1 [0.8%] patient in the placebo group, 10 [7.9%] patients with 70 mg, 8 [6.3%] patients with 210 mg, 6 [4.9%] patients with 490 mg). All ISRs were non-serious and mild or moderate in severity. During the treatment period, 50 SAEs were reported in 37 (7.4%) patients. The number of patients reporting SAEs was  comparable across all cohorts (11 SAEs in 8 patients on placebo, 21 SAEs in 14 patients on 70 mg, 11 SAEs in 9 patients on 210 mg, and 7 SAEs in 6 patients on 490 mg). The most common SAE was asthma. One SAE of moderate livedo reticularis (70 mg) was considered a suspected unexpected serious adverse drug reaction (SUSAR) related to astegolimab and was reported two days after the second dose, leading to discontinuation of astegolimab. Two (0.4%) patients reported anaphylaxis and hypersensitivity reactions: 1 severe SAE of anaphylactic reaction (placebo), and 1 moderate hypersensitivity (490 mg) considered related to astegolimab. Three (0.6%) patients experienced a potential Major Adverse Cardiac Event (MACE) (1 patient each in the placebo, 70 mg, and 210 mg groups). None of the potential MACE were considered related to astegolimab. Overall, 233 (46.4%) patients reported events of infection. A comparable number of patients reported infection across all treatment groups (65 [51.2%] patients on placebo, 55 [43.3%] patients on 70 mg, 58 [46.0%] patients on 210 mg, and 55 [45.1%] patients on 490 mg). The most frequently reported infection (>=10% incidence) was nasopharyngitis (12.7%). One patient on astegolimab 210 mg and the partner of one patient on placebo became pregnant during the study. Both delivered normal/healthy babies. Two deaths, unrelated to study drug, were reported: one patient on 210 mg astegolimab died following an SAE of asthma; the other patient on 490 mg astegolimab had an unexplained death. There were no clinically meaningful changes in laboratory parameters, vital signs, or ECG results, other than the 10% decrease in mean blood eosinophil counts in the astegolimab-treated groups, with no safety concerns. Treatment-induced ADAs were comparable between the astegolimab groups and had no impact on safety. Overall, astegolimab was well tolerated at all doses used and had a safety profile consistent with  that observed in the previous astegolimab Phase I studies.   Serious  Adverse Reactions Observed in Previous COPD Study: In the completed IIS Study HA59431, a total of 81 patients received at least one dose of astegolimab or placebo. The safety profile of astegolimab was similar to that of placebo. There were a total of 222 AEs reported in 62 patients. A total of 28 (72%) patients in the placebo arm and 34 (81%) patients in the astegolimab arm reported at least one AE. The most commonly reported AEs were headache followed by urinary tract infection and viral upper respiratory tract infection. A total of 39 SAEs were reported in 28 (35%) patients; 16 (41%) patients in the placebo group and 12 (29%) in the astegolimab group. The most commonly reported SAE was hospital admission for community acquired pneumonia. Four SAEs resulted in patient discontinuation from treatment (1 patient in the placebo group and 3 patients in the astegolimab group). One patient in the placebo group experienced an AESI of potential MACE (heart failure, unrelated to the study treatment). No anaphylaxis or pregnancies were reported. Two deaths, unrelated to study treatment, were reported: 1 patient in the placebo group died after hospital acquired pneumonia, and another patient in the placebo group died after pneumonia and type 2 respiratory failure.   Safety Data: Astegolimab has been generally well tolerated. There have been 49 patient deaths across all astegolimab studies (Section 5.5.2), none of which were considered related to astegolimab. A total of 144 subjects experienced a total of 224 SAEs across all astegolimab studies. Of these, an SAE livedo reticularis observed in 1 patient was considered related to astegolimab by the investigator (Section 5.5.3). AEs leading to withdrawal (Section 5.5.4) have generally occurred at a rate similar to what would be expected for clinical trials in the studies' respective indications.   [facility]  @ [place] Clinical Research Coordinator note:    This visit for Subject: [id number] DOB: 11-08-1955 on 18Sep2025 for the above protocol is Visit/Encounter week 48 and is for the purpose of research.    Subject expressed continued interest and consent in continuing as a study subject. Subject confirmed that there was no change in contact information (e.g. address, telephone, email). Subject thanked for participation in research and contribution to science. The Subject was informed that the PI Dr. Geronimo continues to have oversight of the subject's visits and course through relevant discussions, reviews and specifically of this visit by routing of this note to the PI.   All assessments and procedures were performed per above stated protocol. Subject tolerated injections well. Please refer to subject's paper source binder for further details of this visit.   Signed by Octaviano Paras, CCRP, COT Clinical Research Coordinator  [facility]  [place], [place]

## 2024-04-21 ENCOUNTER — Other Ambulatory Visit: Payer: Self-pay

## 2024-04-21 DIAGNOSIS — Z006 Encounter for examination for normal comparison and control in clinical research program: Secondary | ICD-10-CM

## 2024-04-21 NOTE — Progress Notes (Unsigned)
 Patient tolerated 2 injections with no reactions on 18Jun2025 LUQ 1.7 ml at 10:54 RUQ 1.7 ml at 10:55

## 2024-04-27 DIAGNOSIS — H2511 Age-related nuclear cataract, right eye: Secondary | ICD-10-CM | POA: Diagnosis not present

## 2024-04-27 DIAGNOSIS — Z87891 Personal history of nicotine dependence: Secondary | ICD-10-CM | POA: Diagnosis not present

## 2024-04-27 DIAGNOSIS — Z79899 Other long term (current) drug therapy: Secondary | ICD-10-CM | POA: Diagnosis not present

## 2024-04-27 DIAGNOSIS — R0601 Orthopnea: Secondary | ICD-10-CM | POA: Diagnosis not present

## 2024-04-27 DIAGNOSIS — J449 Chronic obstructive pulmonary disease, unspecified: Secondary | ICD-10-CM | POA: Diagnosis not present

## 2024-04-27 DIAGNOSIS — H25811 Combined forms of age-related cataract, right eye: Secondary | ICD-10-CM | POA: Diagnosis not present

## 2024-04-28 DIAGNOSIS — Z961 Presence of intraocular lens: Secondary | ICD-10-CM | POA: Diagnosis not present

## 2024-04-28 DIAGNOSIS — Z9841 Cataract extraction status, right eye: Secondary | ICD-10-CM | POA: Diagnosis not present

## 2024-04-28 DIAGNOSIS — Z4889 Encounter for other specified surgical aftercare: Secondary | ICD-10-CM | POA: Diagnosis not present

## 2024-05-04 ENCOUNTER — Encounter

## 2024-05-04 DIAGNOSIS — Z006 Encounter for examination for normal comparison and control in clinical research program: Secondary | ICD-10-CM

## 2024-05-04 MED ORDER — STUDY - ARNASA GB43374 (OPEN-LABEL) - ASTEGOLIMAB 238 MG/1.7 ML SQ INJECTION (PI-RAMASWAMY)
476.0000 mg | INJECTION | SUBCUTANEOUS | Status: AC
  Administered 2024-05-04: 476 mg via SUBCUTANEOUS
  Filled 2024-05-04: qty 3.4

## 2024-05-05 ENCOUNTER — Other Ambulatory Visit: Payer: Self-pay

## 2024-05-05 DIAGNOSIS — Z9841 Cataract extraction status, right eye: Secondary | ICD-10-CM | POA: Diagnosis not present

## 2024-05-05 DIAGNOSIS — Z9842 Cataract extraction status, left eye: Secondary | ICD-10-CM | POA: Diagnosis not present

## 2024-05-05 DIAGNOSIS — H524 Presbyopia: Secondary | ICD-10-CM | POA: Diagnosis not present

## 2024-05-05 DIAGNOSIS — H5203 Hypermetropia, bilateral: Secondary | ICD-10-CM | POA: Diagnosis not present

## 2024-05-05 NOTE — Progress Notes (Signed)
 Patient tolerated 2 injections with no reactions on 02Oct2025 LLQ 1.7 ml at 11:19  RLQ 1.7 ml at 11:20

## 2024-05-05 NOTE — Research (Cosign Needed)
 Title: A phase III Open-Label extension study to evaluate the long-term safety of Astegolimab in patients with Chronic Obstructive Pulmonary disease.   Dose and Duration of Treatment: Astegolimab is presented as a sterile, slightly brown-yellow solution. Each single-use, 2.25 mL pre-filled syringe contains 1.7 mL deliverable volume. Astegolimab drug product is formulated at 140 mg/mL astegolimab with 114 mM succinic acid, 200 mM L-arginine, 10 mM L-methionine, 0.06% (w/v) polysorbate 20, pH 5.7. Placebo for astegolimab is supplied in an identical pre-filled syringe configuration.   Protocol # [id number] Sponsor: [employer] [address] [place]   Protocol Version: v3 dated 15Oct2024 IB: version 9.0 dated April 2024 ICF:  ICFs: Make sure that the subject is re-consented with the updated ICFs Main ICF: [employer] IRB Approved Version 01 Jul 2023, Revised 28Nov2024 Mobile Nursing: [employer] IRB Approved Version 26Oct2023, Revised 08Aug2024 Optional Home Study Treatment: IRB Approved Version 28 May 2022 Revised 11 Mar 2023 ADDENDUM 1: IRB Approved Version 24Jul2024 Revised 8Aug2024 Lab Manual: V2.1.0 dated 21Jun2024   Mechanism of action: Astegolimab (also known as [id number] or [id number]) is a fully human, IgG2 monoclonal antibody that binds with high affinity to the interleukin (IL)-33 (IL-33) receptor, ST2, thereby blocking the signaling of IL-33, an inflammatory cytokine of the IL-1 family and member of the alarmin class of molecules. Astegolimab binds with high affinity to the human and cynomolgus monkey receptor for IL-33, ST2, and blocks IL-33 binding, thus inhibiting association with the IL-1R accessory protein (AcP) co-receptor and formation of an activated receptor complex.   Key Inclusion Criteria: Able and willing to provide written informed consent and to comply with the study protocol. Ability to comply with the requirements of study protocol, according  to the investigators's best judgement. Completion of the 52-week treatment period in either [place] or Study HA55667.   Key Exclusion Criteria: Significant non- compliance in the parent study, specifically defined as missing scheduled visits, per investigators judgement. Any new clinically significant pulmonary disease other than COPD since enrolling in the study. Unstable cardiac disease, myocardial infarction, or [place]  Heart Association Class III or IV heart failure since enrolling in the parent study.   Integrated Pharmacokinetic/Pharmacodynamic Analysis:  In Studies HA60757, [id number], and [id number], exploratory biomarker analysis showed there were consistent decreases in blood eosinophil counts throughout the treatment period, potentially mediated by a direct effect of IL-33 on eosinophil progenitors. In [place], there was no significant difference in fractional exhaled nitric oxide (FeNO) levels (reflecting airway IL-4/IL-13 activity) between astegolimab-treated groups relative to placebo throughout the treatment period. These data suggest that astegolimab has only a limited effect on Type 2 inflammation in asthma. No data are applicable for Study [id number]. No data are available yet for Study HA56688.   Special Warnings/Considerations: Administration of astegolimab, a protein therapeutic, may lead to the development of anti-astegolimab antibodies which could lead to AEs and/or decreased exposure. In non-clinical studies (see Section 4.2), ADA incidence has generally been low and there was no apparent ADA impact on PK and safety in these studies. To date, the immunogenicity rates observed with astegolimab in clinical studies have been relatively low as well (see Immunogenicity Section 5.6). Several clinical studies have been conducted in patients with asthma, atopic dermatitis, COPD (IIS Study HA59431) and COVID-19 severe pneumonia, which generally showed low incidence of ADAs (Table 31).  There has been no correlation between ADA status and clinical findings or increased incidence of AEs. Astegolimab is now being considered in a larger study for the treatment of  COPD. This patient population is typically considered to have a hyper-responsive immune system. Route of administration for this molecule will be [place], either Q2W or Q4W. These factors increase the risk of development of an immune response to astegolimab, specifically with repeat dosing. From the previous IIS Study HA59431, incidence of ADAs was low in COPD patients; this remains to be confirmed in a larger study. To monitor ADA development in ongoing studies, serum samples will be collected from patients at protocol-defined intervals. Patients who test positive for antibodies and have clinical sequelae that are considered potentially related to an ADA response may also be asked to return for additional follow-up testing.   Drug Interaction Studies: No PK drug interaction studies have been conducted to date.   Serious Adverse Reactions Observed in Asthma Study: During the treatment period, a similar proportion of patients across all cohorts experienced at least one AE, regardless of causality (Table 22). In total, 77.2%, 70.9%, 72.2%, and 72.1% of patients reported at least 1 AE in the placebo, 70 mg, 210 mg, and 490 mg groups, respectively. The most common AE's (>5% in any treatment group) were asthma, nasopharyngitis, upper respiratory tract infection, headache, and injection site reaction (ISR). The most common drug-related AE was ISR, which was reported more frequently in the astegolimab treatment groups than in the placebo group (1 [0.8%] patient in the placebo group, 10 [7.9%] patients with 70 mg, 8 [6.3%] patients with 210 mg, 6 [4.9%] patients with 490 mg). All ISRs were non-serious and mild or moderate in severity. During the treatment period, 50 SAEs were reported in 37 (7.4%) patients. The number of patients reporting SAEs was  comparable across all cohorts (11 SAEs in 8 patients on placebo, 21 SAEs in 14 patients on 70 mg, 11 SAEs in 9 patients on 210 mg, and 7 SAEs in 6 patients on 490 mg). The most common SAE was asthma. One SAE of moderate livedo reticularis (70 mg) was considered a suspected unexpected serious adverse drug reaction (SUSAR) related to astegolimab and was reported two days after the second dose, leading to discontinuation of astegolimab. Two (0.4%) patients reported anaphylaxis and hypersensitivity reactions: 1 severe SAE of anaphylactic reaction (placebo), and 1 moderate hypersensitivity (490 mg) considered related to astegolimab. Three (0.6%) patients experienced a potential Major Adverse Cardiac Event (MACE) (1 patient each in the placebo, 70 mg, and 210 mg groups). None of the potential MACE were considered related to astegolimab. Overall, 233 (46.4%) patients reported events of infection. A comparable number of patients reported infection across all treatment groups (65 [51.2%] patients on placebo, 55 [43.3%] patients on 70 mg, 58 [46.0%] patients on 210 mg, and 55 [45.1%] patients on 490 mg). The most frequently reported infection (>=10% incidence) was nasopharyngitis (12.7%). One patient on astegolimab 210 mg and the partner of one patient on placebo became pregnant during the study. Both delivered normal/healthy babies. Two deaths, unrelated to study drug, were reported: one patient on 210 mg astegolimab died following an SAE of asthma; the other patient on 490 mg astegolimab had an unexplained death. There were no clinically meaningful changes in laboratory parameters, vital signs, or ECG results, other than the 10% decrease in mean blood eosinophil counts in the astegolimab-treated groups, with no safety concerns. Treatment-induced ADAs were comparable between the astegolimab groups and had no impact on safety. Overall, astegolimab was well tolerated at all doses used and had a safety profile consistent with  that observed in the previous astegolimab Phase I studies.   Serious  Adverse Reactions Observed in Previous COPD Study: In the completed IIS Study HA59431, a total of 81 patients received at least one dose of astegolimab or placebo. The safety profile of astegolimab was similar to that of placebo. There were a total of 222 AEs reported in 62 patients. A total of 28 (72%) patients in the placebo arm and 34 (81%) patients in the astegolimab arm reported at least one AE. The most commonly reported AEs were headache followed by urinary tract infection and viral upper respiratory tract infection. A total of 39 SAEs were reported in 28 (35%) patients; 16 (41%) patients in the placebo group and 12 (29%) in the astegolimab group. The most commonly reported SAE was hospital admission for community acquired pneumonia. Four SAEs resulted in patient discontinuation from treatment (1 patient in the placebo group and 3 patients in the astegolimab group). One patient in the placebo group experienced an AESI of potential MACE (heart failure, unrelated to the study treatment). No anaphylaxis or pregnancies were reported. Two deaths, unrelated to study treatment, were reported: 1 patient in the placebo group died after hospital acquired pneumonia, and another patient in the placebo group died after pneumonia and type 2 respiratory failure.   Safety Data: Astegolimab has been generally well tolerated. There have been 49 patient deaths across all astegolimab studies (Section 5.5.2), none of which were considered related to astegolimab. A total of 144 subjects experienced a total of 224 SAEs across all astegolimab studies. Of these, an SAE livedo reticularis observed in 1 patient was considered related to astegolimab by the investigator (Section 5.5.3). AEs leading to withdrawal (Section 5.5.4) have generally occurred at a rate similar to what would be expected for clinical trials in the studies' respective indications.   [facility]  @ [place] Clinical Research Coordinator note:    This visit for Subject: [id number] DOB: 18-Aug-1955 on 02Oct2025 for the above protocol is Visit/Encounter week 50 and is for the purpose of research.    Subject expressed continued interest and consent in continuing as a study subject. Subject confirmed that there was no change in contact information (e.g. address, telephone, email). Subject thanked for participation in research and contribution to science. The Subject was informed that the PI Dr. Geronimo continues to have oversight of the subject's visits and course through relevant discussions, reviews and specifically of this visit by routing of this note to the PI.  All assessments and procedures were performed per above stated protocol. Subject tolerated injections well.  Please refer to subject's paper source binder for further details of this visit.   Signed by Octaviano Paras, CCRP, COT Clinical Research Coordinator  [facility]  [place], [place]

## 2024-05-15 ENCOUNTER — Telehealth: Payer: Self-pay

## 2024-05-15 DIAGNOSIS — J441 Chronic obstructive pulmonary disease with (acute) exacerbation: Secondary | ICD-10-CM

## 2024-05-16 MED ORDER — DOXYCYCLINE HYCLATE 100 MG PO TABS: ORAL_TABLET | ORAL | 1 refills | Status: AC

## 2024-05-16 MED ORDER — PREDNISONE 10 MG PO TABS: ORAL_TABLET | ORAL | 0 refills | Status: AC

## 2024-05-16 NOTE — Addendum Note (Signed)
 Addended by: GERONIMO AMEL on: 05/16/2024 09:03 AM   Modules accepted: Orders

## 2024-05-16 NOTE — Telephone Encounter (Cosign Needed)
 Hi Dr. Milus,  Below is the message I received on 13Oct2025 from Ms. Marylen. I will let her know you have received this message.   Thanks, [facility] all is well. I have been coughing every now and then not often but it has color yellowish with a bit of green in my sputum. No fever and no other symptoms but I want to prevent anything from developing and I'm drinking more water to loosened up whatever may be trying to take place so if you could contact Dr Geronimo with this information and ask him to send me in some prednisone  w/ antibiotic with refill just in case and he or Hop can check me on Thursday I would appreciate it very kindly. I'm not laying around doing my everyday stuff just want to be on top of this and the weather has changed. Thank you! Octaviano so much see you on Thursday [facility] [address] Mariellen Marylen

## 2024-05-16 NOTE — Telephone Encounter (Signed)
   STaff MD/PI note  Advised Katie Greene AECOPD via [facility]  Plan  - Take doxycycline  100mg  po twice daily x 5 days; take after meals and avoid sunlight Please take prednisone  40 mg x1 day, then 30 mg x1 day, then 20 mg x1 day, then 10 mg x1 day, and then 5 mg x1 day and stop - go to ER if worse  Order done  REsearch [facility] will inform patient  Allergies  Allergen Reactions   Daliresp  [Roflumilast ] Diarrhea, Nausea And Vomiting and Other (See Comments)    Upset stomach

## 2024-05-18 ENCOUNTER — Encounter: Admitting: Internal Medicine

## 2024-05-18 MED ORDER — STUDY - ARNASA GB43374 (OPEN-LABEL) - ASTEGOLIMAB 238 MG/1.7 ML SQ INJECTION (PI-RAMASWAMY)
476.0000 mg | INJECTION | SUBCUTANEOUS | Status: AC
  Administered 2024-05-18: 476 mg via SUBCUTANEOUS
  Filled 2024-05-18: qty 3.4

## 2024-05-19 ENCOUNTER — Other Ambulatory Visit: Payer: Self-pay

## 2024-05-19 NOTE — Progress Notes (Signed)
 Patient tolerated 2 injections with no reactions on 16Oct2025 LUQ1.7 ml at 13:18 RUQ 1.7 ml at 13:30

## 2024-05-19 NOTE — Research (Cosign Needed)
 Title: A phase III Open-Label extension study to evaluate the long-term safety of Astegolimab in patients with Chronic Obstructive Pulmonary disease.   Dose and Duration of Treatment: Astegolimab is presented as a sterile, slightly brown-yellow solution. Each single-use, 2.25 mL pre-filled syringe contains 1.7 mL deliverable volume. Astegolimab drug product is formulated at 140 mg/mL astegolimab with 114 mM succinic acid, 200 mM L-arginine, 10 mM L-methionine, 0.06% (w/v) polysorbate 20, pH 5.7. Placebo for astegolimab is supplied in an identical pre-filled syringe configuration.   Protocol # [id number] Sponsor: [employer] [address], [place]   Protocol Version: v3 dated 15Oct2024 IB: version 9.0 dated April 2024 ICF:  ICFs: Make sure that the subject is re-consented with the updated ICFs Main ICF: [facility] Approved Version 01 Jul 2023, Revised 28Nov2024 Mobile Nursing: [facility] [facility] Approved Version 26Oct2023, Revised 08Aug2024 Optional Home Study Treatment: IRB Approved Version 28 May 2022 Revised 11 Mar 2023 ADDENDUM 1: IRB Approved Version 24Jul2024 Revised 8Aug2024 Lab Manual: V2.1.0 dated 21Jun2024   Mechanism of action: Astegolimab (also known as [id number] or [id number]) is a fully human, IgG2 monoclonal antibody that binds with high affinity to the interleukin (IL)-33 (IL-33) receptor, ST2, thereby blocking the signaling of IL-33, an inflammatory cytokine of the IL-1 family and member of the alarmin class of molecules. Astegolimab binds with high affinity to the human and cynomolgus monkey receptor for IL-33, ST2, and blocks IL-33 binding, thus inhibiting association with the IL-1R accessory protein (AcP) co-receptor and formation of an activated receptor complex.   Key Inclusion Criteria: Able and willing to provide written informed consent and to comply with the study protocol. Ability to comply with the requirements of study protocol, according  to the investigators's best judgement. Completion of the 52-week treatment period in either [place] or Study HA55667.   Key Exclusion Criteria: Significant non- compliance in the parent study, specifically defined as missing scheduled visits, per investigators judgement. Any new clinically significant pulmonary disease other than COPD since enrolling in the study. Unstable cardiac disease, myocardial infarction, or [place]  Heart Association Class III or IV heart failure since enrolling in the parent study.   Integrated Pharmacokinetic/Pharmacodynamic Analysis:  In Studies HA60757, [id number], and [id number], exploratory biomarker analysis showed there were consistent decreases in blood eosinophil counts throughout the treatment period, potentially mediated by a direct effect of IL-33 on eosinophil progenitors. In [place], there was no significant difference in fractional exhaled nitric oxide (FeNO) levels (reflecting airway IL-4/IL-13 activity) between astegolimab-treated groups relative to placebo throughout the treatment period. These data suggest that astegolimab has only a limited effect on Type 2 inflammation in asthma. No data are applicable for Study HJ57530. No data are available yet for Study HA56688.   Special Warnings/Considerations: Administration of astegolimab, a protein therapeutic, may lead to the development of anti-astegolimab antibodies which could lead to AEs and/or decreased exposure. In non-clinical studies (see Section 4.2), ADA incidence has generally been low and there was no apparent ADA impact on PK and safety in these studies. To date, the immunogenicity rates observed with astegolimab in clinical studies have been relatively low as well (see Immunogenicity Section 5.6). Several clinical studies have been conducted in patients with asthma, atopic dermatitis, COPD (IIS Study HA59431) and COVID-19 severe pneumonia, which generally showed low incidence of ADAs (Table 31).  There has been no correlation between ADA status and clinical findings or increased incidence of AEs. Astegolimab is now being considered in a larger study for the treatment of  COPD. This patient population is typically considered to have a hyper-responsive immune system. Route of administration for this molecule will be [place], either Q2W or Q4W. These factors increase the risk of development of an immune response to astegolimab, specifically with repeat dosing. From the previous IIS Study HA59431, incidence of ADAs was low in COPD patients; this remains to be confirmed in a larger study. To monitor ADA development in ongoing studies, serum samples will be collected from patients at protocol-defined intervals. Patients who test positive for antibodies and have clinical sequelae that are considered potentially related to an ADA response may also be asked to return for additional follow-up testing.   Drug Interaction Studies: No PK drug interaction studies have been conducted to date.   Serious Adverse Reactions Observed in Asthma Study: During the treatment period, a similar proportion of patients across all cohorts experienced at least one AE, regardless of causality (Table 22). In total, 77.2%, 70.9%, 72.2%, and 72.1% of patients reported at least 1 AE in the placebo, 70 mg, 210 mg, and 490 mg groups, respectively. The most common AE's (>5% in any treatment group) were asthma, nasopharyngitis, upper respiratory tract infection, headache, and injection site reaction (ISR). The most common drug-related AE was ISR, which was reported more frequently in the astegolimab treatment groups than in the placebo group (1 [0.8%] patient in the placebo group, 10 [7.9%] patients with 70 mg, 8 [6.3%] patients with 210 mg, 6 [4.9%] patients with 490 mg). All ISRs were non-serious and mild or moderate in severity. During the treatment period, 50 SAEs were reported in 37 (7.4%) patients. The number of patients reporting SAEs was  comparable across all cohorts (11 SAEs in 8 patients on placebo, 21 SAEs in 14 patients on 70 mg, 11 SAEs in 9 patients on 210 mg, and 7 SAEs in 6 patients on 490 mg). The most common SAE was asthma. One SAE of moderate livedo reticularis (70 mg) was considered a suspected unexpected serious adverse drug reaction (SUSAR) related to astegolimab and was reported two days after the second dose, leading to discontinuation of astegolimab. Two (0.4%) patients reported anaphylaxis and hypersensitivity reactions: 1 severe SAE of anaphylactic reaction (placebo), and 1 moderate hypersensitivity (490 mg) considered related to astegolimab. Three (0.6%) patients experienced a potential Major Adverse Cardiac Event (MACE) (1 patient each in the placebo, 70 mg, and 210 mg groups). None of the potential MACE were considered related to astegolimab. Overall, 233 (46.4%) patients reported events of infection. A comparable number of patients reported infection across all treatment groups (65 [51.2%] patients on placebo, 55 [43.3%] patients on 70 mg, 58 [46.0%] patients on 210 mg, and 55 [45.1%] patients on 490 mg). The most frequently reported infection (>=10% incidence) was nasopharyngitis (12.7%). One patient on astegolimab 210 mg and the partner of one patient on placebo became pregnant during the study. Both delivered normal/healthy babies. Two deaths, unrelated to study drug, were reported: one patient on 210 mg astegolimab died following an SAE of asthma; the other patient on 490 mg astegolimab had an unexplained death. There were no clinically meaningful changes in laboratory parameters, vital signs, or ECG results, other than the 10% decrease in mean blood eosinophil counts in the astegolimab-treated groups, with no safety concerns. Treatment-induced ADAs were comparable between the astegolimab groups and had no impact on safety. Overall, astegolimab was well tolerated at all doses used and had a safety profile consistent with  that observed in the previous astegolimab Phase I studies.   Serious  Adverse Reactions Observed in Previous COPD Study: In the completed IIS Study HA59431, a total of 81 patients received at least one dose of astegolimab or placebo. The safety profile of astegolimab was similar to that of placebo. There were a total of 222 AEs reported in 62 patients. A total of 28 (72%) patients in the placebo arm and 34 (81%) patients in the astegolimab arm reported at least one AE. The most commonly reported AEs were headache followed by urinary tract infection and viral upper respiratory tract infection. A total of 39 SAEs were reported in 28 (35%) patients; 16 (41%) patients in the placebo group and 12 (29%) in the astegolimab group. The most commonly reported SAE was hospital admission for community acquired pneumonia. Four SAEs resulted in patient discontinuation from treatment (1 patient in the placebo group and 3 patients in the astegolimab group). One patient in the placebo group experienced an AESI of potential MACE (heart failure, unrelated to the study treatment). No anaphylaxis or pregnancies were reported. Two deaths, unrelated to study treatment, were reported: 1 patient in the placebo group died after hospital acquired pneumonia, and another patient in the placebo group died after pneumonia and type 2 respiratory failure.   Safety Data: Astegolimab has been generally well tolerated. There have been 49 patient deaths across all astegolimab studies (Section 5.5.2), none of which were considered related to astegolimab. A total of 144 subjects experienced a total of 224 SAEs across all astegolimab studies. Of these, an SAE livedo reticularis observed in 1 patient was considered related to astegolimab by the investigator (Section 5.5.3). AEs leading to withdrawal (Section 5.5.4) have generally occurred at a rate similar to what would be expected for clinical trials in the studies' respective indications.   [facility]  @ [place] Clinical Research Coordinator note:    This visit for Subject: [id number] DOB: 31-Mar-1956 on 16Oct2025 for the above protocol is Visit/Encounter week 52 and is for the purpose of research.    Subject expressed continued interest and consent in continuing as a study subject. Subject confirmed that there was no change in contact information (e.g. address, telephone, email). Subject thanked for participation in research and contribution to science. The Subject was informed that the PI Dr. Geronimo continues to have oversight of the subject's visits and course through relevant discussions, reviews and specifically of this visit by routing of this note to the PI.  All assessments and procedures were performed per above stated protocol. Subject tolerated injections well. Please refer to subject's paper source binder for further details of this visit.   Signed by Octaviano Paras, CCRP, COT Clinical Research Coordinator  [facility]  [place], [place]

## 2024-05-24 DIAGNOSIS — H35362 Drusen (degenerative) of macula, left eye: Secondary | ICD-10-CM | POA: Diagnosis not present

## 2024-05-24 DIAGNOSIS — J449 Chronic obstructive pulmonary disease, unspecified: Secondary | ICD-10-CM | POA: Diagnosis not present

## 2024-05-24 DIAGNOSIS — Z9841 Cataract extraction status, right eye: Secondary | ICD-10-CM | POA: Diagnosis not present

## 2024-05-24 DIAGNOSIS — Z9842 Cataract extraction status, left eye: Secondary | ICD-10-CM | POA: Diagnosis not present

## 2024-05-24 DIAGNOSIS — H35713 Central serous chorioretinopathy, bilateral: Secondary | ICD-10-CM | POA: Diagnosis not present

## 2024-06-21 ENCOUNTER — Ambulatory Visit (HOSPITAL_COMMUNITY)
Admission: RE | Admit: 2024-06-21 | Discharge: 2024-06-21 | Disposition: A | Discharge status: Home / Self Care | Source: Ambulatory Visit | Attending: Acute Care | Admitting: Acute Care

## 2024-06-21 DIAGNOSIS — Z122 Encounter for screening for malignant neoplasm of respiratory organs: Secondary | ICD-10-CM | POA: Diagnosis not present

## 2024-06-21 DIAGNOSIS — Z87891 Personal history of nicotine dependence: Secondary | ICD-10-CM | POA: Diagnosis not present

## 2024-06-23 DIAGNOSIS — Z961 Presence of intraocular lens: Secondary | ICD-10-CM | POA: Diagnosis not present

## 2024-06-23 DIAGNOSIS — H3581 Retinal edema: Secondary | ICD-10-CM | POA: Diagnosis not present

## 2024-06-23 DIAGNOSIS — H353221 Exudative age-related macular degeneration, left eye, with active choroidal neovascularization: Secondary | ICD-10-CM | POA: Diagnosis not present

## 2024-06-28 ENCOUNTER — Other Ambulatory Visit: Payer: Self-pay

## 2024-06-28 DIAGNOSIS — Z87891 Personal history of nicotine dependence: Secondary | ICD-10-CM

## 2024-06-28 DIAGNOSIS — Z122 Encounter for screening for malignant neoplasm of respiratory organs: Secondary | ICD-10-CM

## 2024-07-14 ENCOUNTER — Other Ambulatory Visit: Payer: Self-pay | Admitting: Internal Medicine

## 2024-07-24 ENCOUNTER — Ambulatory Visit: Payer: Self-pay

## 2024-07-24 NOTE — Telephone Encounter (Signed)
 [facility] Pulmonary Triage - Initial Assessment Questions Chief Complaint (e.g., cough, sob, wheezing, fever, chills, sweat or additional symptoms) *Go to specific symptom protocol after initial questions. Productive cough with yellow-green mucus. Mild wheezing.  How long have symptoms been present? 1 month  Have you tested for COVID or Flu? Note: If not, ask patient if a home test can be taken. If so, instruct patient to call back for positive results. No  MEDICINES:   Have you used any OTC meds to help with symptoms? No If yes, ask What medications?   Have you used your inhalers/maintenance medication? Yes If yes, What medications? Breztri   If inhaler, ask How many puffs and how often? Note: Review instructions on medication in the chart. Rescue inhaler  OXYGEN : Do you wear supplemental oxygen ? Yes If yes, How many liters are you supposed to use? 2L  Do you monitor your oxygen  levels? Yes If yes, What is your reading (oxygen  level) today?   What is your usual oxygen  saturation reading?  (Note: Pulmonary O2 sats should be 90% or greater)   FYI Only or Action Required?: Action required by provider: update on patient condition.  Patient was last seen in primary care on .  Called Nurse Triage reporting Cough.  Symptoms began about a month ago.  Interventions attempted: Rest, hydration, or home remedies.  Symptoms are: unchanged.Pt. Has a productive cough x 1 month. Yellow-green mucus. Feels sob with coughing spells..  Triage Disposition: See PCP When Office is Open (Within 3 Days)  Patient/caregiver understands and will follow disposition?: No, refuses disposition  Asking for Prednisone  and antibiotic to be called in. Recent eye surgery. Please advise pt.   Copied from CRM #[id number]. Topic: Clinical - Red Word Triage >> Jul 24, 2024 10:41 AM Corean SAUNDERS wrote: Red Word that prompted transfer to Nurse Triage: Coughing up yellow and green mucus and  short of breath. Reason for Disposition  Cough has been present for > 3 weeks  Answer Assessment - Initial Assessment Questions 1. ONSET: When did the cough begin?      1 month 2. SEVERITY: How bad is the cough today?      moderate 3. SPUTUM: Describe the color of your sputum (e.g., none, dry cough; clear, white, yellow, green)     Yellow-green 4. HEMOPTYSIS: Are you coughing up any blood? If Yes, ask: How much? (e.g., flecks, streaks, tablespoons, etc.)     no 5. DIFFICULTY BREATHING: Are you having difficulty breathing? If Yes, ask: How bad is it? (e.g., mild, moderate, severe)      Feels breathless when coughing 6. FEVER: Do you have a fever? If Yes, ask: What is your temperature, how was it measured, and when did it start?     no 7. CARDIAC HISTORY: Do you have any history of heart disease? (e.g., heart attack, congestive heart failure)      no 8. LUNG HISTORY: Do you have any history of lung disease?  (e.g., pulmonary embolus, asthma, emphysema)     yes 9. PE RISK FACTORS: Do you have a history of blood clots? (or: recent major surgery, recent prolonged travel, bedridden)     no 10. OTHER SYMPTOMS: Do you have any other symptoms? (e.g., runny nose, wheezing, chest pain)       Wheezing - mild 11. PREGNANCY: Is there any chance you are pregnant? When was your last menstrual period?       no 12. TRAVEL: Have you traveled out of the country in the last  month? (e.g., travel history, exposures)       no  Protocols used: Cough - Acute Productive-A-AH

## 2024-07-24 NOTE — Telephone Encounter (Signed)
 Called and spoke with patient, she says she does not feel bad, she just has a cough with some yellow and green in her mucus.  I advised we have not seen her since February of 2025 and it was a video visit.  She said she is in the research and has been seen there being seen. She said she usually deals with Dr. Geronimo directly.   I advised her that research and pulmonary clinic are separate and Dr. Geronimo is currently on vacation for the next 2 weeks.  I advised her to contact her PCP unless she would like to be seen the Tuesday after Christmas.  She said she would contact her PCP office. Nothing further needed.

## 2024-08-10 ENCOUNTER — Encounter: Admitting: Internal Medicine

## 2024-08-10 DIAGNOSIS — Z006 Encounter for examination for normal comparison and control in clinical research program: Secondary | ICD-10-CM

## 2024-08-10 NOTE — Progress Notes (Unsigned)
 Title: A phase III Open-Label extension study to evaluate the long-term safety of Astegolimab in patients with Chronic Obstructive Pulmonary disease.   Dose and Duration of Treatment: Astegolimab is presented as a sterile, slightly brown-yellow solution. Each single-use, 2.25 mL pre-filled syringe contains 1.7 mL deliverable volume. Astegolimab drug product is formulated at 140 mg/mL astegolimab with 114 mM succinic acid, 200 mM L-arginine, 10 mM L-methionine, 0.06% (w/v) polysorbate 20, pH 5.7. Placebo for astegolimab is supplied in an identical pre-filled syringe configuration.   Protocol # [id number] Sponsor: [employer] [address], [place]   Protocol Version: v3 dated 15Oct2024 IB: version 9.0 dated April 2024 ICF:  ICFs: Make sure that the subject is re-consented with the updated ICFs Main ICF: [employer] [facility] Approved Version 01 Jul 2023, Revised 28Nov2024 Mobile Nursing: [employer] [facility] Approved Version 26Oct2023, Revised 08Aug2024 Optional Home Study Treatment: IRB Approved Version 28 May 2022 Revised 11 Mar 2023 ADDENDUM 1: IRB Approved Version 24Jul2024 Revised 8Aug2024 Lab Manual: V2.1.0 dated 21Jun2024   Mechanism of action: Astegolimab (also known as [id number] or [id number]) is a fully human, IgG2 monoclonal antibody that binds with high affinity to the interleukin (IL)-33 (IL-33) receptor, ST2, thereby blocking the signaling of IL-33, an inflammatory cytokine of the IL-1 family and member of the alarmin class of molecules. Astegolimab binds with high affinity to the human and cynomolgus monkey receptor for IL-33, ST2, and blocks IL-33 binding, thus inhibiting association with the IL-1R accessory protein (AcP) co-receptor and formation of an activated receptor complex.   Key Inclusion Criteria: Able and willing to provide written informed consent and to comply with the study protocol. Ability to comply with the requirements of study protocol, according  to the investigators's best judgement. Completion of the 52-week treatment period in either [place] or Study HA55667.   Key Exclusion Criteria: Significant non- compliance in the parent study, specifically defined as missing scheduled visits, per investigators judgement. Any new clinically significant pulmonary disease other than COPD since enrolling in the study. Unstable cardiac disease, myocardial infarction, or [place]  Heart Association Class III or IV heart failure since enrolling in the parent study.   Integrated Pharmacokinetic/Pharmacodynamic Analysis:  In Studies HA60757, [id number], and [id number], exploratory biomarker analysis showed there were consistent decreases in blood eosinophil counts throughout the treatment period, potentially mediated by a direct effect of IL-33 on eosinophil progenitors. In [place], there was no significant difference in fractional exhaled nitric oxide (FeNO) levels (reflecting airway IL-4/IL-13 activity) between astegolimab-treated groups relative to placebo throughout the treatment period. These data suggest that astegolimab has only a limited effect on Type 2 inflammation in asthma. No data are applicable for Study [id number]. No data are available yet for Study HA56688.   Special Warnings/Considerations: Administration of astegolimab, a protein therapeutic, may lead to the development of anti-astegolimab antibodies which could lead to AEs and/or decreased exposure. In non-clinical studies (see Section 4.2), ADA incidence has generally been low and there was no apparent ADA impact on PK and safety in these studies. To date, the immunogenicity rates observed with astegolimab in clinical studies have been relatively low as well (see Immunogenicity Section 5.6). Several clinical studies have been conducted in patients with asthma, atopic dermatitis, COPD (IIS Study HA59431) and COVID-19 severe pneumonia, which generally showed low incidence of ADAs (Table 31).  There has been no correlation between ADA status and clinical findings or increased incidence of AEs. Astegolimab is now being considered in a larger study for the treatment of  COPD. This patient population is typically considered to have a hyper-responsive immune system. Route of administration for this molecule will be [place], either Q2W or Q4W. These factors increase the risk of development of an immune response to astegolimab, specifically with repeat dosing. From the previous IIS Study HA59431, incidence of ADAs was low in COPD patients; this remains to be confirmed in a larger study. To monitor ADA development in ongoing studies, serum samples will be collected from patients at protocol-defined intervals. Patients who test positive for antibodies and have clinical sequelae that are considered potentially related to an ADA response may also be asked to return for additional follow-up testing.   Drug Interaction Studies: No PK drug interaction studies have been conducted to date.   Serious Adverse Reactions Observed in Asthma Study: During the treatment period, a similar proportion of patients across all cohorts experienced at least one AE, regardless of causality (Table 22). In total, 77.2%, 70.9%, 72.2%, and 72.1% of patients reported at least 1 AE in the placebo, 70 mg, 210 mg, and 490 mg groups, respectively. The most common AE's (>5% in any treatment group) were asthma, nasopharyngitis, upper respiratory tract infection, headache, and injection site reaction (ISR). The most common drug-related AE was ISR, which was reported more frequently in the astegolimab treatment groups than in the placebo group (1 [0.8%] patient in the placebo group, 10 [7.9%] patients with 70 mg, 8 [6.3%] patients with 210 mg, 6 [4.9%] patients with 490 mg). All ISRs were non-serious and mild or moderate in severity. During the treatment period, 50 SAEs were reported in 37 (7.4%) patients. The number of patients reporting SAEs was  comparable across all cohorts (11 SAEs in 8 patients on placebo, 21 SAEs in 14 patients on 70 mg, 11 SAEs in 9 patients on 210 mg, and 7 SAEs in 6 patients on 490 mg). The most common SAE was asthma. One SAE of moderate livedo reticularis (70 mg) was considered a suspected unexpected serious adverse drug reaction (SUSAR) related to astegolimab and was reported two days after the second dose, leading to discontinuation of astegolimab. Two (0.4%) patients reported anaphylaxis and hypersensitivity reactions: 1 severe SAE of anaphylactic reaction (placebo), and 1 moderate hypersensitivity (490 mg) considered related to astegolimab. Three (0.6%) patients experienced a potential Major Adverse Cardiac Event (MACE) (1 patient each in the placebo, 70 mg, and 210 mg groups). None of the potential MACE were considered related to astegolimab. Overall, 233 (46.4%) patients reported events of infection. A comparable number of patients reported infection across all treatment groups (65 [51.2%] patients on placebo, 55 [43.3%] patients on 70 mg, 58 [46.0%] patients on 210 mg, and 55 [45.1%] patients on 490 mg). The most frequently reported infection (>=10% incidence) was nasopharyngitis (12.7%). One patient on astegolimab 210 mg and the partner of one patient on placebo became pregnant during the study. Both delivered normal/healthy babies. Two deaths, unrelated to study drug, were reported: one patient on 210 mg astegolimab died following an SAE of asthma; the other patient on 490 mg astegolimab had an unexplained death. There were no clinically meaningful changes in laboratory parameters, vital signs, or ECG results, other than the 10% decrease in mean blood eosinophil counts in the astegolimab-treated groups, with no safety concerns. Treatment-induced ADAs were comparable between the astegolimab groups and had no impact on safety. Overall, astegolimab was well tolerated at all doses used and had a safety profile consistent with  that observed in the previous astegolimab Phase I studies.   Serious  Adverse Reactions Observed in Previous COPD Study: In the completed IIS Study HA59431, a total of 81 patients received at least one dose of astegolimab or placebo. The safety profile of astegolimab was similar to that of placebo. There were a total of 222 AEs reported in 62 patients. A total of 28 (72%) patients in the placebo arm and 34 (81%) patients in the astegolimab arm reported at least one AE. The most commonly reported AEs were headache followed by urinary tract infection and viral upper respiratory tract infection. A total of 39 SAEs were reported in 28 (35%) patients; 16 (41%) patients in the placebo group and 12 (29%) in the astegolimab group. The most commonly reported SAE was hospital admission for community acquired pneumonia. Four SAEs resulted in patient discontinuation from treatment (1 patient in the placebo group and 3 patients in the astegolimab group). One patient in the placebo group experienced an AESI of potential MACE (heart failure, unrelated to the study treatment). No anaphylaxis or pregnancies were reported. Two deaths, unrelated to study treatment, were reported: 1 patient in the placebo group died after hospital acquired pneumonia, and another patient in the placebo group died after pneumonia and type 2 respiratory failure.   Safety Data: Astegolimab has been generally well tolerated. There have been 49 patient deaths across all astegolimab studies (Section 5.5.2), none of which were considered related to astegolimab. A total of 144 subjects experienced a total of 224 SAEs across all astegolimab studies. Of these, an SAE livedo reticularis observed in 1 patient was considered related to astegolimab by the investigator (Section 5.5.3). AEs leading to withdrawal (Section 5.5.4) have generally occurred at a rate similar to what would be expected for clinical trials in the studies' respective indications.   [facility]  @ [place] Clinical Research Coordinator note:    This visit for Subject: [id number] DOB: 04Aug1960 on 08Jan2026 for the above protocol is Visit/Encounter # Safety Follow Up post 12 weeks visit after last IP injections   All assessments and procedures were performed per above stated protocol. Please refer to subject's paper source binder for further details of this visit.   Signed by Octaviano Paras, CCRP, COT Clinical Research Coordinator  [facility]  [place], [place]

## 2024-08-10 NOTE — Research (Unsigned)
 Title: A phase III Open-Label extension study to evaluate the long-term safety of Astegolimab in patients with Chronic Obstructive Pulmonary disease.   Dose and Duration of Treatment: Astegolimab is presented as a sterile, slightly brown-yellow solution. Each single-use, 2.25 mL pre-filled syringe contains 1.7 mL deliverable volume. Astegolimab drug product is formulated at 140 mg/mL astegolimab with 114 mM succinic acid, 200 mM L-arginine, 10 mM L-methionine, 0.06% (w/v) polysorbate 20, pH 5.7. Placebo for astegolimab is supplied in an identical pre-filled syringe configuration.   Protocol # [id number] Sponsor: [employer] [address], [place]   Protocol Version: v3 dated 15Oct2024 IB: version 9.0 dated April 2024 ICF:  ICFs: Make sure that the subject is re-consented with the updated ICFs Main ICF: [facility] IRB Approved Version 01 Jul 2023, Revised 28Nov2024 Mobile Nursing: [employer] [facility] Approved Version 26Oct2023, Revised 08Aug2024 Optional Home Study Treatment: IRB Approved Version 28 May 2022 Revised 11 Mar 2023 ADDENDUM 1: IRB Approved Version 24Jul2024 Revised 8Aug2024 Lab Manual: V2.1.0 dated 21Jun2024   Mechanism of action: Astegolimab (also known as [id number] or [id number]) is a fully human, IgG2 monoclonal antibody that binds with high affinity to the interleukin (IL)-33 (IL-33) receptor, ST2, thereby blocking the signaling of IL-33, an inflammatory cytokine of the IL-1 family and member of the alarmin class of molecules. Astegolimab binds with high affinity to the human and cynomolgus monkey receptor for IL-33, ST2, and blocks IL-33 binding, thus inhibiting association with the IL-1R accessory protein (AcP) co-receptor and formation of an activated receptor complex.   Key Inclusion Criteria: Able and willing to provide written informed consent and to comply with the study protocol. Ability to comply with the requirements of study protocol, according  to the investigators's best judgement. Completion of the 52-week treatment period in either [place] or Study HA55667.   Key Exclusion Criteria: Significant non- compliance in the parent study, specifically defined as missing scheduled visits, per investigators judgement. Any new clinically significant pulmonary disease other than COPD since enrolling in the study. Unstable cardiac disease, myocardial infarction, or [place]  Heart Association Class III or IV heart failure since enrolling in the parent study.   Integrated Pharmacokinetic/Pharmacodynamic Analysis:  In Studies HA60757, [id number], and [id number], exploratory biomarker analysis showed there were consistent decreases in blood eosinophil counts throughout the treatment period, potentially mediated by a direct effect of IL-33 on eosinophil progenitors. In [place], there was no significant difference in fractional exhaled nitric oxide (FeNO) levels (reflecting airway IL-4/IL-13 activity) between astegolimab-treated groups relative to placebo throughout the treatment period. These data suggest that astegolimab has only a limited effect on Type 2 inflammation in asthma. No data are applicable for Study [id number]. No data are available yet for Study HA56688.   Special Warnings/Considerations: Administration of astegolimab, a protein therapeutic, may lead to the development of anti-astegolimab antibodies which could lead to AEs and/or decreased exposure. In non-clinical studies (see Section 4.2), ADA incidence has generally been low and there was no apparent ADA impact on PK and safety in these studies. To date, the immunogenicity rates observed with astegolimab in clinical studies have been relatively low as well (see Immunogenicity Section 5.6). Several clinical studies have been conducted in patients with asthma, atopic dermatitis, COPD (IIS Study HA59431) and COVID-19 severe pneumonia, which generally showed low incidence of ADAs (Table 31).  There has been no correlation between ADA status and clinical findings or increased incidence of AEs. Astegolimab is now being considered in a larger study for the treatment of  COPD. This patient population is typically considered to have a hyper-responsive immune system. Route of administration for this molecule will be [place], either Q2W or Q4W. These factors increase the risk of development of an immune response to astegolimab, specifically with repeat dosing. From the previous IIS Study HA59431, incidence of ADAs was low in COPD patients; this remains to be confirmed in a larger study. To monitor ADA development in ongoing studies, serum samples will be collected from patients at protocol-defined intervals. Patients who test positive for antibodies and have clinical sequelae that are considered potentially related to an ADA response may also be asked to return for additional follow-up testing.   Drug Interaction Studies: No PK drug interaction studies have been conducted to date.   Serious Adverse Reactions Observed in Asthma Study: During the treatment period, a similar proportion of patients across all cohorts experienced at least one AE, regardless of causality (Table 22). In total, 77.2%, 70.9%, 72.2%, and 72.1% of patients reported at least 1 AE in the placebo, 70 mg, 210 mg, and 490 mg groups, respectively. The most common AE's (>5% in any treatment group) were asthma, nasopharyngitis, upper respiratory tract infection, headache, and injection site reaction (ISR). The most common drug-related AE was ISR, which was reported more frequently in the astegolimab treatment groups than in the placebo group (1 [0.8%] patient in the placebo group, 10 [7.9%] patients with 70 mg, 8 [6.3%] patients with 210 mg, 6 [4.9%] patients with 490 mg). All ISRs were non-serious and mild or moderate in severity. During the treatment period, 50 SAEs were reported in 37 (7.4%) patients. The number of patients reporting SAEs was  comparable across all cohorts (11 SAEs in 8 patients on placebo, 21 SAEs in 14 patients on 70 mg, 11 SAEs in 9 patients on 210 mg, and 7 SAEs in 6 patients on 490 mg). The most common SAE was asthma. One SAE of moderate livedo reticularis (70 mg) was considered a suspected unexpected serious adverse drug reaction (SUSAR) related to astegolimab and was reported two days after the second dose, leading to discontinuation of astegolimab. Two (0.4%) patients reported anaphylaxis and hypersensitivity reactions: 1 severe SAE of anaphylactic reaction (placebo), and 1 moderate hypersensitivity (490 mg) considered related to astegolimab. Three (0.6%) patients experienced a potential Major Adverse Cardiac Event (MACE) (1 patient each in the placebo, 70 mg, and 210 mg groups). None of the potential MACE were considered related to astegolimab. Overall, 233 (46.4%) patients reported events of infection. A comparable number of patients reported infection across all treatment groups (65 [51.2%] patients on placebo, 55 [43.3%] patients on 70 mg, 58 [46.0%] patients on 210 mg, and 55 [45.1%] patients on 490 mg). The most frequently reported infection (>=10% incidence) was nasopharyngitis (12.7%). One patient on astegolimab 210 mg and the partner of one patient on placebo became pregnant during the study. Both delivered normal/healthy babies. Two deaths, unrelated to study drug, were reported: one patient on 210 mg astegolimab died following an SAE of asthma; the other patient on 490 mg astegolimab had an unexplained death. There were no clinically meaningful changes in laboratory parameters, vital signs, or ECG results, other than the 10% decrease in mean blood eosinophil counts in the astegolimab-treated groups, with no safety concerns. Treatment-induced ADAs were comparable between the astegolimab groups and had no impact on safety. Overall, astegolimab was well tolerated at all doses used and had a safety profile consistent with  that observed in the previous astegolimab Phase I studies.   Serious  Adverse Reactions Observed in Previous COPD Study: In the completed IIS Study HA59431, a total of 81 patients received at least one dose of astegolimab or placebo. The safety profile of astegolimab was similar to that of placebo. There were a total of 222 AEs reported in 62 patients. A total of 28 (72%) patients in the placebo arm and 34 (81%) patients in the astegolimab arm reported at least one AE. The most commonly reported AEs were headache followed by urinary tract infection and viral upper respiratory tract infection. A total of 39 SAEs were reported in 28 (35%) patients; 16 (41%) patients in the placebo group and 12 (29%) in the astegolimab group. The most commonly reported SAE was hospital admission for community acquired pneumonia. Four SAEs resulted in patient discontinuation from treatment (1 patient in the placebo group and 3 patients in the astegolimab group). One patient in the placebo group experienced an AESI of potential MACE (heart failure, unrelated to the study treatment). No anaphylaxis or pregnancies were reported. Two deaths, unrelated to study treatment, were reported: 1 patient in the placebo group died after hospital acquired pneumonia, and another patient in the placebo group died after pneumonia and type 2 respiratory failure.   Safety Data: Astegolimab has been generally well tolerated. There have been 49 patient deaths across all astegolimab studies (Section 5.5.2), none of which were considered related to astegolimab. A total of 144 subjects experienced a total of 224 SAEs across all astegolimab studies. Of these, an SAE livedo reticularis observed in 1 patient was considered related to astegolimab by the investigator (Section 5.5.3). AEs leading to withdrawal (Section 5.5.4) have generally occurred at a rate similar to what would be expected for clinical trials in the studies' respective indications.   [facility]  @ [place] Clinical Research Coordinator note:    This visit for Subject: [id number] DOB: 04Aug1960 on 08Jan2026 for the above protocol is Visit/Encounter # Safety Follow Up post 12 weeks visit after last IP injections   All assessments and procedures were performed per above stated protocol. Please refer to subject's paper source binder for further details of this visit.   Signed by Octaviano Paras, CCRP, COT Clinical Research Coordinator  [facility]  [place], [place]

## 2024-08-26 NOTE — Progress Notes (Signed)
"   Sharlet FELIX  Beason, DOB [birth year], was seen as subject in a clinical trial /Protocol #[id number]. Cardiopulmonary symptoms are stable at this time ; but she recently took 5-day course of doxycycline  and burst of oral prednisone  for bronchitic symptoms. Other or new symptoms denied. Pertinent physical findings include: Slight accentuation of S1 and S2; distant breath sounds; pedal pulses not palpable; and 1+ pedal edema. All physical findings NCS                                                                     Elsie JULIANNA Roses MD,SI  "

## 2024-09-03 ENCOUNTER — Other Ambulatory Visit: Payer: Self-pay | Admitting: Internal Medicine

## 2024-10-13 ENCOUNTER — Ambulatory Visit: Admitting: Internal Medicine
